# Patient Record
Sex: Female | Born: 1950 | Race: Black or African American | Hispanic: No | Smoking: Current some day smoker
Health system: Southern US, Community
[De-identification: ages and names within clinical notes are randomized; demographics above are authoritative.]

## PROBLEM LIST (undated history)

## (undated) DIAGNOSIS — M1990 Unspecified osteoarthritis, unspecified site: Secondary | ICD-10-CM

## (undated) DIAGNOSIS — H269 Unspecified cataract: Secondary | ICD-10-CM

## (undated) DIAGNOSIS — F319 Bipolar disorder, unspecified: Secondary | ICD-10-CM

## (undated) HISTORY — DX: Unspecified cataract: H26.9

## (undated) HISTORY — PX: ABDOMINAL HYSTERECTOMY: SHX81

## (undated) HISTORY — DX: Unspecified osteoarthritis, unspecified site: M19.90

---

## 2006-03-12 ENCOUNTER — Emergency Department (HOSPITAL_COMMUNITY): Admission: EM | Admit: 2006-03-12 | Discharge: 2006-03-12 | Payer: Self-pay | Admitting: Emergency Medicine

## 2006-03-13 ENCOUNTER — Ambulatory Visit: Payer: Self-pay | Admitting: *Deleted

## 2006-06-08 ENCOUNTER — Ambulatory Visit: Payer: Self-pay | Admitting: Family Medicine

## 2006-07-03 ENCOUNTER — Ambulatory Visit: Payer: Self-pay | Admitting: Family Medicine

## 2007-01-18 ENCOUNTER — Emergency Department (HOSPITAL_COMMUNITY): Admission: EM | Admit: 2007-01-18 | Discharge: 2007-01-18 | Payer: Self-pay | Admitting: Emergency Medicine

## 2007-03-06 ENCOUNTER — Encounter (INDEPENDENT_AMBULATORY_CARE_PROVIDER_SITE_OTHER): Payer: Self-pay | Admitting: *Deleted

## 2008-10-15 ENCOUNTER — Ambulatory Visit: Payer: Self-pay | Admitting: Family Medicine

## 2008-10-28 ENCOUNTER — Ambulatory Visit: Payer: Self-pay | Admitting: Family Medicine

## 2009-01-07 ENCOUNTER — Ambulatory Visit: Payer: Self-pay | Admitting: Internal Medicine

## 2009-02-23 ENCOUNTER — Telehealth (INDEPENDENT_AMBULATORY_CARE_PROVIDER_SITE_OTHER): Payer: Self-pay | Admitting: *Deleted

## 2009-03-07 ENCOUNTER — Emergency Department (HOSPITAL_COMMUNITY): Admission: EM | Admit: 2009-03-07 | Discharge: 2009-03-07 | Payer: Self-pay | Admitting: Emergency Medicine

## 2009-03-31 ENCOUNTER — Emergency Department (HOSPITAL_COMMUNITY): Admission: EM | Admit: 2009-03-31 | Discharge: 2009-03-31 | Payer: Self-pay | Admitting: Emergency Medicine

## 2009-05-24 ENCOUNTER — Ambulatory Visit: Payer: Self-pay | Admitting: Internal Medicine

## 2009-05-24 LAB — CONVERTED CEMR LAB
Albumin: 4.5 g/dL (ref 3.5–5.2)
Alkaline Phosphatase: 108 units/L (ref 39–117)
CO2: 26 meq/L (ref 19–32)
Chloride: 105 meq/L (ref 96–112)
Cholesterol: 171 mg/dL (ref 0–200)
Eosinophils Relative: 4 % (ref 0–5)
Hemoglobin: 13.1 g/dL (ref 12.0–15.0)
Lymphocytes Relative: 36 % (ref 12–46)
Lymphs Abs: 2 10*3/uL (ref 0.7–4.0)
MCHC: 31.4 g/dL (ref 30.0–36.0)
MCV: 80 fL (ref 78.0–100.0)
Monocytes Relative: 6 % (ref 3–12)
Neutro Abs: 3 10*3/uL (ref 1.7–7.7)
Neutrophils Relative %: 53 % (ref 43–77)
RBC: 5.21 M/uL — ABNORMAL HIGH (ref 3.87–5.11)
RDW: 18.1 % — ABNORMAL HIGH (ref 11.5–15.5)
Sodium: 142 meq/L (ref 135–145)
Total CHOL/HDL Ratio: 3.1
Total Protein: 7.3 g/dL (ref 6.0–8.3)

## 2009-06-04 ENCOUNTER — Encounter: Admission: RE | Admit: 2009-06-04 | Discharge: 2009-06-04 | Payer: Self-pay | Admitting: Internal Medicine

## 2009-07-09 ENCOUNTER — Ambulatory Visit: Payer: Self-pay | Admitting: Internal Medicine

## 2009-07-30 ENCOUNTER — Ambulatory Visit: Payer: Self-pay | Admitting: Internal Medicine

## 2009-10-05 ENCOUNTER — Ambulatory Visit: Payer: Self-pay | Admitting: Internal Medicine

## 2010-01-13 ENCOUNTER — Ambulatory Visit: Payer: Self-pay | Admitting: Internal Medicine

## 2010-07-10 ENCOUNTER — Encounter: Payer: Self-pay | Admitting: Internal Medicine

## 2011-06-06 ENCOUNTER — Emergency Department (HOSPITAL_COMMUNITY)
Admission: EM | Admit: 2011-06-06 | Discharge: 2011-06-06 | Disposition: A | Discharge status: Home / Self Care | Payer: Medicaid Other | Attending: Emergency Medicine | Admitting: Emergency Medicine

## 2011-06-06 ENCOUNTER — Emergency Department (HOSPITAL_COMMUNITY): Payer: Medicaid Other

## 2011-06-06 DIAGNOSIS — S8253XA Displaced fracture of medial malleolus of unspecified tibia, initial encounter for closed fracture: Secondary | ICD-10-CM

## 2011-06-06 DIAGNOSIS — M25476 Effusion, unspecified foot: Secondary | ICD-10-CM | POA: Insufficient documentation

## 2011-06-06 DIAGNOSIS — F319 Bipolar disorder, unspecified: Secondary | ICD-10-CM | POA: Insufficient documentation

## 2011-06-06 DIAGNOSIS — M79609 Pain in unspecified limb: Secondary | ICD-10-CM | POA: Insufficient documentation

## 2011-06-06 DIAGNOSIS — IMO0002 Reserved for concepts with insufficient information to code with codable children: Secondary | ICD-10-CM | POA: Insufficient documentation

## 2011-06-06 DIAGNOSIS — M7989 Other specified soft tissue disorders: Secondary | ICD-10-CM | POA: Insufficient documentation

## 2011-06-06 DIAGNOSIS — F17200 Nicotine dependence, unspecified, uncomplicated: Secondary | ICD-10-CM | POA: Insufficient documentation

## 2011-06-06 DIAGNOSIS — M25473 Effusion, unspecified ankle: Secondary | ICD-10-CM | POA: Insufficient documentation

## 2011-06-06 HISTORY — DX: Bipolar disorder, unspecified: F31.9

## 2011-06-06 MED ORDER — HYDROCODONE-ACETAMINOPHEN 5-325 MG PO TABS
1.0000 | ORAL_TABLET | Freq: Once | ORAL | Status: AC
  Administered 2011-06-06: 1 via ORAL
  Filled 2011-06-06: qty 1

## 2011-06-06 MED ORDER — HYDROCODONE-ACETAMINOPHEN 5-325 MG PO TABS: 1.0000 | ORAL_TABLET | ORAL | Status: AC | PRN

## 2011-06-06 NOTE — Progress Notes (Signed)
Orthopedic Tech Progress Note Patient Details:  Valerie Rodgers 1950-10-13 [id number]  Other Ortho Devices Type of Ortho Device: CAM walker Ortho Device Location: (R) LE Ortho Device Interventions: Application   [name] 06/06/2011, 6:55 PM
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
## 2011-06-06 NOTE — ED Notes (Signed)
Returned from xray
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
## 2011-06-06 NOTE — ED Notes (Signed)
Patient transported to X-ray 

## 2011-06-06 NOTE — Progress Notes (Signed)
Orthopedic Tech Progress Note Patient Details:  Valerie Rodgers 1950-07-20 [id number]  Other Ortho Devices Type of Ortho Device: Crutches  Type of Splint: Short Leg Splint Location: (R) LE Splint Interventions: Application    [name] 06/06/2011, 6:32 PM

## 2011-06-14 NOTE — ED Provider Notes (Signed)
Medical screening examination/treatment/procedure(s) were performed by non-physician practitioner and as supervising physician I was immediately available for consultation/collaboration.   Kyndle Schlender E Sabrinna Yearwood, MD 06/14/11 0740 

## 2014-01-05 ENCOUNTER — Ambulatory Visit: Payer: Self-pay | Admitting: Podiatry

## 2015-10-28 ENCOUNTER — Encounter: Payer: Self-pay | Admitting: Specialist

## 2015-11-05 ENCOUNTER — Encounter: Payer: Self-pay | Admitting: Internal Medicine

## 2015-12-22 ENCOUNTER — Ambulatory Visit (AMBULATORY_SURGERY_CENTER): Payer: Self-pay

## 2015-12-22 VITALS — Ht 63.5 in | Wt 190.8 lb

## 2015-12-22 DIAGNOSIS — Z1211 Encounter for screening for malignant neoplasm of colon: Secondary | ICD-10-CM

## 2015-12-22 MED ORDER — SUPREP BOWEL PREP KIT 17.5-3.13-1.6 GM/177ML PO SOLN: 1.0000 | Freq: Once | ORAL | Status: DC

## 2015-12-22 NOTE — Progress Notes (Signed)
No allergies to eggs or soy No past problems with anesthesia No diet meds No home oxygen  No internet 

## 2015-12-24 ENCOUNTER — Encounter: Payer: Self-pay | Admitting: Internal Medicine

## 2016-01-04 ENCOUNTER — Ambulatory Visit (AMBULATORY_SURGERY_CENTER): Payer: Medicare Other | Admitting: Internal Medicine

## 2016-01-04 ENCOUNTER — Encounter: Payer: Self-pay | Admitting: Internal Medicine

## 2016-01-04 VITALS — BP 132/60 | HR 70 | Temp 95.9°F | Resp 19 | Ht 63.0 in | Wt 190.0 lb

## 2016-01-04 DIAGNOSIS — Z1211 Encounter for screening for malignant neoplasm of colon: Secondary | ICD-10-CM | POA: Diagnosis not present

## 2016-01-04 MED ORDER — SODIUM CHLORIDE 0.9 % IV SOLN: 500.0000 mL | INTRAVENOUS | Status: DC

## 2016-01-04 NOTE — Progress Notes (Signed)
Report to PACU, RN, vss, BBS= Clear.  

## 2016-01-04 NOTE — Patient Instructions (Signed)
YOU HAD AN ENDOSCOPIC PROCEDURE TODAY AT THE [facility]:   Refer to the procedure report that was given to you for any specific questions about what was found during the examination.  If the procedure report does not answer your questions, please call your gastroenterologist to clarify.  If you requested that your care partner not be given the details of your procedure findings, then the procedure report has been included in a sealed envelope for you to review at your convenience later.  YOU SHOULD EXPECT: Some feelings of bloating in the abdomen. Passage of more gas than usual.  Walking can help get rid of the air that was put into your GI tract during the procedure and reduce the bloating. If you had a lower endoscopy (such as a colonoscopy or flexible sigmoidoscopy) you may notice spotting of blood in your stool or on the toilet paper. If you underwent a bowel prep for your procedure, you may not have a normal bowel movement for a few days.  Please Note:  You might notice some irritation and congestion in your nose or some drainage.  This is from the oxygen used during your procedure.  There is no need for concern and it should clear up in a day or so.  SYMPTOMS TO REPORT IMMEDIATELY:   Following lower endoscopy (colonoscopy or flexible sigmoidoscopy):  Excessive amounts of blood in the stool  Significant tenderness or worsening of abdominal pains  Swelling of the abdomen that is new, acute  Fever of 100F or higher   For urgent or emergent issues, a gastroenterologist can be reached at any hour by calling [phone].   DIET: Your first meal following the procedure should be a small meal and then it is ok to progress to your normal diet. Heavy or fried foods are harder to digest and may make you feel nauseous or bloated.  Likewise, meals heavy in dairy and vegetables can increase bloating.  Drink plenty of fluids but you should avoid alcoholic beverages for 24 hours. Try to  increase the fiber in your diet, and drink plenty of water.  ACTIVITY:  You should plan to take it easy for the rest of today and you should NOT DRIVE or use heavy machinery until tomorrow (because of the sedation medicines used during the test).    FOLLOW UP: Our staff will call the number listed on your records the next business day following your procedure to check on you and address any questions or concerns that you may have regarding the information given to you following your procedure. If we do not reach you, we will leave a message.  However, if you are feeling well and you are not experiencing any problems, there is no need to return our call.  We will assume that you have returned to your regular daily activities without incident.  If any biopsies were taken you will be contacted by phone or by letter within the next 1-3 weeks.  Please call us at [phone] if you have not heard about the biopsies in 3 weeks.    SIGNATURES/CONFIDENTIALITY: You and/or your care partner have signed paperwork which will be entered into your electronic medical record.  These signatures attest to the fact that that the information above on your After Visit Summary has been reviewed and is understood.  Full responsibility of the confidentiality of this discharge information lies with you and/or your care-partner.  Read all of the handouts given to you by your recovery   room nurse.  Thank-you for choosing us for your healthcare needs today. 

## 2016-01-04 NOTE — Op Note (Signed)
[facility] Patient Name: Valerie Rodgers Procedure Date: 01/04/2016 11:17 AM MRN: [id number] Endoscopist: Beverley Fiedler , MD Age: 65 Referring MD:  Date of Birth: May 01, 1951 Gender: Female Account #: [id number] Procedure:                Colonoscopy Indications:              Screening for colorectal malignant neoplasm, This                            is the patient's first colonoscopy Medicines:                Monitored Anesthesia Care Procedure:                Pre-Anesthesia Assessment:                           - Prior to the procedure, a History and Physical                            was performed, and patient medications and                            allergies were reviewed. The patient's tolerance of                            previous anesthesia was also reviewed. The risks                            and benefits of the procedure and the sedation                            options and risks were discussed with the patient.                            All questions were answered, and informed consent                            was obtained. Prior Anticoagulants: The patient has                            taken no previous anticoagulant or antiplatelet                            agents. ASA Grade Assessment: II - A patient with                            mild systemic disease. After reviewing the risks                            and benefits, the patient was deemed in                            satisfactory condition to undergo the procedure.  After obtaining informed consent, the colonoscope                            was passed under direct vision. Throughout the                            procedure, the patient's blood pressure, pulse, and                            oxygen saturations were monitored continuously. The                            Model PCF-H190DL [id number]) scope was introduced                            through the anus and  advanced to the the cecum,                            identified by appendiceal orifice and ileocecal                            valve. The colonoscopy was performed without                            difficulty. The patient tolerated the procedure                            well. The quality of the bowel preparation was                            good. The ileocecal valve, appendiceal orifice, and                            rectum were photographed. Scope In: 11:25:54 AM Scope Out: 11:38:28 AM Scope Withdrawal Time: 0 hours 9 minutes 22 seconds  Total Procedure Duration: 0 hours 12 minutes 34 seconds  Findings:                 The digital rectal exam was normal.                           Multiple small-mouthed diverticula were found in                            the sigmoid colon.                           The exam was otherwise without abnormality on                            direct and retroflexion views. Complications:            No immediate complications. Estimated Blood Loss:     Estimated blood loss: none. Impression:               - Diverticulosis in the sigmoid colon.                           -  The examination was otherwise normal on direct                            and retroflexion views.                           - No specimens collected. Recommendation:           - Patient has a contact number available for                            emergencies. The signs and symptoms of potential                            delayed complications were discussed with the                            patient. Return to normal activities tomorrow.                            Written discharge instructions were provided to the                            patient.                           - Resume previous diet.                           - Continue present medications.                           - Repeat colonoscopy in 10 years for screening                            purposes. Beverley Fiedler,  MD 01/04/2016 11:40:37 AM This report has been signed electronically.

## 2016-01-05 ENCOUNTER — Telehealth: Payer: Self-pay

## 2016-01-05 NOTE — Telephone Encounter (Signed)
  Follow up Call-  Call back number 01/04/2016  Post procedure Call Back phone  # [phone]  Permission to leave phone message Yes     Patient questions:  Do you have a fever, pain , or abdominal swelling? No. Pain Score  0 *  Have you tolerated food without any problems? Yes.    Have you been able to return to your normal activities? Yes.    Do you have any questions about your discharge instructions: Diet   No. Medications  No. Follow up visit  No.  Do you have questions or concerns about your Care? No.  Actions: * If pain score is 4 or above: No action needed, pain <4.

## 2016-11-23 ENCOUNTER — Ambulatory Visit (INDEPENDENT_AMBULATORY_CARE_PROVIDER_SITE_OTHER): Payer: Medicare Other | Admitting: Orthopaedic Surgery

## 2017-01-17 ENCOUNTER — Encounter (HOSPITAL_COMMUNITY): Payer: Self-pay | Admitting: Emergency Medicine

## 2017-01-17 ENCOUNTER — Emergency Department (HOSPITAL_COMMUNITY)
Admission: EM | Admit: 2017-01-17 | Discharge: 2017-01-17 | Discharge status: Against Medical Advice | Payer: Medicare Other | Attending: Emergency Medicine | Admitting: Emergency Medicine

## 2017-01-17 DIAGNOSIS — R079 Chest pain, unspecified: Secondary | ICD-10-CM | POA: Insufficient documentation

## 2017-01-17 DIAGNOSIS — Z5321 Procedure and treatment not carried out due to patient leaving prior to being seen by health care provider: Secondary | ICD-10-CM | POA: Diagnosis not present

## 2017-01-17 LAB — CBC
HCT: 42.7 % (ref 36.0–46.0)
Hemoglobin: 14.1 g/dL (ref 12.0–15.0)
MCH: 27.5 pg (ref 26.0–34.0)
MCHC: 33 g/dL (ref 30.0–36.0)
MCV: 83.4 fL (ref 78.0–100.0)
PLATELETS: 285 10*3/uL (ref 150–400)
RBC: 5.12 MIL/uL — AB (ref 3.87–5.11)
RDW: 15.4 % (ref 11.5–15.5)
WBC: 9.9 10*3/uL (ref 4.0–10.5)

## 2017-01-17 LAB — BASIC METABOLIC PANEL
Anion gap: 6 (ref 5–15)
BUN: 29 mg/dL — ABNORMAL HIGH (ref 6–20)
CALCIUM: 8.8 mg/dL — AB (ref 8.9–10.3)
CO2: 26 mmol/L (ref 22–32)
CREATININE: 1.54 mg/dL — AB (ref 0.44–1.00)
Chloride: 108 mmol/L (ref 101–111)
GFR, EST AFRICAN AMERICAN: 39 mL/min — AB (ref >60)
GFR, EST NON AFRICAN AMERICAN: 34 mL/min — AB (ref >60)
Glucose, Bld: 102 mg/dL — ABNORMAL HIGH (ref 65–99)
Potassium: 4.3 mmol/L (ref 3.5–5.1)
SODIUM: 140 mmol/L (ref 135–145)

## 2017-01-17 LAB — I-STAT TROPONIN, ED: TROPONIN I, POC: 0.01 ng/mL (ref 0.00–0.08)
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
## 2017-01-17 NOTE — ED Notes (Signed)
Pt reports central CP with L shoulder pain that started approx 30 min ago. Pt thinks it is r/t the spaghetti she just ate. Before this RN could even finish triage she stated she needed to leave b/c she forgot to turn her oven off. Pt LWBS.

## 2017-11-05 ENCOUNTER — Other Ambulatory Visit: Payer: Self-pay | Admitting: Specialist

## 2017-11-05 DIAGNOSIS — Z1231 Encounter for screening mammogram for malignant neoplasm of breast: Secondary | ICD-10-CM

## 2017-11-27 ENCOUNTER — Ambulatory Visit: Payer: Medicare Other

## 2018-03-21 ENCOUNTER — Other Ambulatory Visit: Payer: Self-pay

## 2018-03-21 DIAGNOSIS — I83893 Varicose veins of bilateral lower extremities with other complications: Secondary | ICD-10-CM

## 2018-03-21 DIAGNOSIS — I739 Peripheral vascular disease, unspecified: Secondary | ICD-10-CM

## 2018-03-28 ENCOUNTER — Other Ambulatory Visit: Payer: Self-pay | Admitting: Podiatry

## 2018-03-28 ENCOUNTER — Ambulatory Visit (INDEPENDENT_AMBULATORY_CARE_PROVIDER_SITE_OTHER): Payer: Medicare Other

## 2018-03-28 ENCOUNTER — Ambulatory Visit (INDEPENDENT_AMBULATORY_CARE_PROVIDER_SITE_OTHER): Payer: Medicare Other | Admitting: Podiatry

## 2018-03-28 VITALS — BP 115/84 | HR 54

## 2018-03-28 DIAGNOSIS — M21962 Unspecified acquired deformity of left lower leg: Secondary | ICD-10-CM

## 2018-03-28 DIAGNOSIS — M21611 Bunion of right foot: Secondary | ICD-10-CM

## 2018-03-28 DIAGNOSIS — M21961 Unspecified acquired deformity of right lower leg: Secondary | ICD-10-CM

## 2018-03-28 DIAGNOSIS — M21612 Bunion of left foot: Secondary | ICD-10-CM

## 2018-03-28 DIAGNOSIS — M25571 Pain in right ankle and joints of right foot: Secondary | ICD-10-CM | POA: Diagnosis not present

## 2018-03-28 DIAGNOSIS — M2011 Hallux valgus (acquired), right foot: Secondary | ICD-10-CM

## 2018-03-28 DIAGNOSIS — M2012 Hallux valgus (acquired), left foot: Secondary | ICD-10-CM

## 2018-03-28 DIAGNOSIS — M25572 Pain in left ankle and joints of left foot: Secondary | ICD-10-CM

## 2018-03-28 NOTE — Patient Instructions (Signed)

## 2018-03-29 NOTE — Progress Notes (Signed)
  Subjective:  Patient ID: Valerie Rodgers, female    DOB: 11-15-1950,  MRN: [id number]  Chief Complaint  Patient presents with  . Bunions    bilateral with callouses - starting to become painful   67 y.o. female presents with the above complaint. Denies DM states they are starting to become very painful.  Review of Systems: Negative except as noted in the HPI. Denies N/V/F/Ch.  Past Medical History:  Diagnosis Date  . Arthritis   . Bipolar affective disorder (HCC)   . Cataract     Current Outpatient Medications:  .  acetaminophen-codeine (TYLENOL #4) 300-60 MG tablet, , Disp: , Rfl:  .  albuterol (PROVENTIL HFA;VENTOLIN HFA) 108 (90 Base) MCG/ACT inhaler, , Disp: , Rfl:  .  Flaxseed, Linseed, (FLAX SEED OIL PO), Take 1 capsule by mouth daily., Disp: , Rfl:  .  ibuprofen (ADVIL,MOTRIN) 200 MG tablet, Take 400 mg by mouth every 6 (six) hours as needed. For pain , Disp: , Rfl:  .  Multiple Vitamin (MULTIVITAMIN) tablet, Take 1 tablet by mouth daily., Disp: , Rfl:   Social History   Tobacco Use  Smoking Status Current Some Day Smoker  Smokeless Tobacco Never Used    No Known Allergies Objective:   Vitals:   03/28/18 1017  BP: 115/84  Pulse: (!) 54   There is no height or weight on file to calculate BMI. Constitutional Well developed. Well nourished.  Vascular Dorsalis pedis pulses palpable bilaterally. Posterior tibial pulses palpable bilaterally. Capillary refill normal to all digits.  No cyanosis or clubbing noted. Pedal hair growth normal.  Neurologic Normal speech. Oriented to person, place, and time. Epicritic sensation to light touch grossly present bilaterally.  Dermatologic Nails well groomed and normal in appearance. No open wounds. No skin lesions.  Orthopedic: Normal joint ROM without pain or crepitus bilaterally. Hallux abductovalgus deformity present - bilat Left 1st MPJ full range of motion. Left 1st TMT without gross hypermobility. Right 1st MPJ  full range of motion  Right 1st TMT without gross hypermobility. Lesser digital contractures absent bilaterally.   Radiographs: Taken and reviewed. Hallux abductovalgus deformity present.   Assessment:   1. Bilateral bunions   2. Pain in joints of both feet   3. Deformity of metatarsal bone of left foot   4. Deformity of metatarsal bone of right foot   5. Acquired hallux valgus of both feet    Plan:  Patient was evaluated and treated and all questions answered.  Hallux abductovalgus deformity,  -XR as above. -Discussed conservative therapy including padding and shoe gear change. -Will discuss possible surgical intervention at next visit.  Return in about 6 weeks (around 05/09/2018) for Bunion f/u, discuss possible surgery.

## 2018-05-08 ENCOUNTER — Ambulatory Visit: Payer: Medicare Other

## 2018-05-09 ENCOUNTER — Ambulatory Visit: Payer: Medicare Other | Admitting: Podiatry

## 2018-05-14 ENCOUNTER — Inpatient Hospital Stay (HOSPITAL_COMMUNITY): Admission: RE | Admit: 2018-05-14 | Payer: Medicare Other | Source: Ambulatory Visit

## 2018-05-15 ENCOUNTER — Encounter: Payer: Medicare Other | Admitting: Vascular Surgery

## 2018-05-15 ENCOUNTER — Inpatient Hospital Stay (HOSPITAL_COMMUNITY): Admission: RE | Admit: 2018-05-15 | Payer: Medicare Other | Source: Ambulatory Visit

## 2018-11-08 ENCOUNTER — Encounter: Payer: Self-pay | Admitting: Podiatry

## 2018-11-08 ENCOUNTER — Other Ambulatory Visit: Payer: Self-pay

## 2018-11-08 ENCOUNTER — Ambulatory Visit (INDEPENDENT_AMBULATORY_CARE_PROVIDER_SITE_OTHER): Payer: Medicare Other | Admitting: Podiatry

## 2018-11-08 VITALS — Temp 97.2°F

## 2018-11-08 DIAGNOSIS — M2011 Hallux valgus (acquired), right foot: Secondary | ICD-10-CM

## 2018-11-08 DIAGNOSIS — M21611 Bunion of right foot: Secondary | ICD-10-CM | POA: Diagnosis not present

## 2018-11-08 MED ORDER — CICLOPIROX 8 % EX SOLN: Freq: Every day | CUTANEOUS | 0 refills | Status: DC

## 2018-11-17 NOTE — Progress Notes (Signed)
  Subjective:  Patient ID: Valerie Rodgers, female    DOB: Dec 17, 1950,  MRN: [id number]  Chief Complaint  Patient presents with  . Consult    Discuss surgery for HAV bilateral (R>L) - patient would like to start with the right foot first    68 y.o. female presents with the above complaint.  Here for surgical discussion of bunions to both feet.   Review of Systems: Negative except as noted in the HPI. Denies N/V/F/Ch.  Past Medical History:  Diagnosis Date  . Arthritis   . Bipolar affective disorder (HCC)   . Cataract     Current Outpatient Medications:  .  albuterol (PROVENTIL HFA;VENTOLIN HFA) 108 (90 Base) MCG/ACT inhaler, , Disp: , Rfl:  .  amLODipine (NORVASC) 5 MG tablet, Take 5 mg by mouth daily., Disp: , Rfl:  .  ciclopirox (PENLAC) 8 % solution, Apply topically at bedtime. Apply over nail and surrounding skin. Apply daily over previous coat. Remove weekly with file or polish remover., Disp: 6.6 mL, Rfl: 0 .  Flaxseed, Linseed, (FLAX SEED OIL PO), Take 1 capsule by mouth daily., Disp: , Rfl:  .  lisinopril-hydrochlorothiazide (ZESTORETIC) 10-12.5 MG tablet, , Disp: , Rfl:  .  Multiple Vitamin (MULTIVITAMIN) tablet, Take 1 tablet by mouth daily., Disp: , Rfl:  .  SYMBICORT 160-4.5 MCG/ACT inhaler, Inhale 2 puffs into the lungs 2 (two) times daily., Disp: , Rfl:   Social History   Tobacco Use  Smoking Status Current Some Day Smoker  Smokeless Tobacco Never Used    No Known Allergies Objective:   Vitals:   11/08/18 1314  Temp: (!) 97.2 F (36.2 C)   There is no height or weight on file to calculate BMI. Constitutional Well developed. Well nourished.  Vascular Dorsalis pedis pulses palpable bilaterally. Posterior tibial pulses palpable bilaterally. Capillary refill normal to all digits.  No cyanosis or clubbing noted. Pedal hair growth normal.  Neurologic Normal speech. Oriented to person, place, and time. Epicritic sensation to light touch grossly present  bilaterally.  Dermatologic Nails well groomed and normal in appearance. No open wounds. No skin lesions.  Orthopedic:  HAV deformity bilaterally without pain to palpation   Radiographs: Taken reviewed hallux abductovalgus deformity Assessment:   1. Hallux valgus with bunions of right foot    Plan:  Patient was evaluated and treated and all questions answered.  Hallux valgus deformity right foot -Discussed with patient that due to the fact that they do not hurt her I would recommend against proceeding with surgery recommend shoe gear changes and if she does experience pain we can further consider.  Advised her to quit smoking if she would like to consider surgery.  Follow-up as needed should they cause her pain  No follow-ups on file.

## 2018-12-09 ENCOUNTER — Other Ambulatory Visit: Payer: Self-pay | Admitting: Specialist

## 2018-12-09 DIAGNOSIS — Z1231 Encounter for screening mammogram for malignant neoplasm of breast: Secondary | ICD-10-CM

## 2019-01-23 ENCOUNTER — Ambulatory Visit: Payer: Medicare Other

## 2019-01-29 ENCOUNTER — Ambulatory Visit
Admission: RE | Admit: 2019-01-29 | Discharge: 2019-01-29 | Disposition: A | Discharge status: Home / Self Care | Payer: Medicare Other | Source: Ambulatory Visit | Attending: Specialist | Admitting: Specialist

## 2019-01-29 ENCOUNTER — Other Ambulatory Visit: Payer: Self-pay

## 2019-01-29 DIAGNOSIS — Z1231 Encounter for screening mammogram for malignant neoplasm of breast: Secondary | ICD-10-CM

## 2019-07-29 ENCOUNTER — Ambulatory Visit: Payer: Medicare Other | Attending: Internal Medicine

## 2019-09-22 ENCOUNTER — Ambulatory Visit: Payer: Medicare Other

## 2019-09-25 ENCOUNTER — Ambulatory Visit: Payer: Medicare Other | Attending: Internal Medicine

## 2019-09-25 DIAGNOSIS — Z23 Encounter for immunization: Secondary | ICD-10-CM

## 2019-09-25 NOTE — Progress Notes (Signed)
   Covid-19 Vaccination Clinic  Name:  LEATHIE WEICH    MRN: [id number] DOB: 03-Aug-1950  09/25/2019  Ms. Wester was observed post Covid-19 immunization for 15 minutes without incident. She was provided with Vaccine Information Sheet and instruction to access the V-Safe system.   Ms. Tricarico was instructed to call 911 with any severe reactions post vaccine: Marland Kitchen Difficulty breathing  . Swelling of face and throat  . A fast heartbeat  . A bad rash all over body  . Dizziness and weakness   Immunizations Administered    Name Date Dose VIS Date Route   Pfizer COVID-19 Vaccine 09/25/2019 12:10 PM 0.3 mL 05/30/2019 Intramuscular   Manufacturer: [employer], [employer]   Lot: [id number]   NDC: [address]-3

## 2019-10-22 ENCOUNTER — Ambulatory Visit: Payer: Medicare Other | Attending: Internal Medicine

## 2019-10-22 DIAGNOSIS — Z23 Encounter for immunization: Secondary | ICD-10-CM

## 2019-10-22 NOTE — Progress Notes (Signed)
   Covid-19 Vaccination Clinic  Name:  [name]    MRN: [id number] DOB: March 08, 1951  10/22/2019  [name] was observed post Covid-19 immunization for 15 minutes without incident. She was provided with Vaccine Information Sheet and instruction to access the V-Safe system.   [name] was instructed to call 911 with any severe reactions post vaccine: Marland Kitchen Difficulty breathing  . Swelling of face and throat  . A fast heartbeat  . A bad rash all over body  . Dizziness and weakness   Immunizations Administered    Name Date Dose VIS Date Route   Pfizer COVID-19 Vaccine 10/22/2019 12:39 PM 0.3 mL 08/13/2018 Intramuscular   Manufacturer: [employer], [employer]   Lot: [id number]   NDC: [address]-7

## 2021-07-17 ENCOUNTER — Other Ambulatory Visit: Payer: Self-pay

## 2021-07-17 ENCOUNTER — Emergency Department (HOSPITAL_COMMUNITY): Payer: Medicare Other

## 2021-07-17 ENCOUNTER — Inpatient Hospital Stay (HOSPITAL_COMMUNITY)
Admission: EM | Admit: 2021-07-17 | Discharge: 2021-07-23 | DRG: 291 | Disposition: A | Discharge status: Home Health | Payer: Medicare Other | Attending: Internal Medicine | Admitting: Internal Medicine

## 2021-07-17 DIAGNOSIS — I471 Supraventricular tachycardia: Secondary | ICD-10-CM | POA: Diagnosis present

## 2021-07-17 DIAGNOSIS — K7469 Other cirrhosis of liver: Secondary | ICD-10-CM

## 2021-07-17 DIAGNOSIS — T405X1A Poisoning by cocaine, accidental (unintentional), initial encounter: Secondary | ICD-10-CM | POA: Diagnosis present

## 2021-07-17 DIAGNOSIS — I501 Left ventricular failure: Secondary | ICD-10-CM | POA: Diagnosis present

## 2021-07-17 DIAGNOSIS — I10 Essential (primary) hypertension: Secondary | ICD-10-CM | POA: Diagnosis present

## 2021-07-17 DIAGNOSIS — R778 Other specified abnormalities of plasma proteins: Secondary | ICD-10-CM

## 2021-07-17 DIAGNOSIS — J9601 Acute respiratory failure with hypoxia: Secondary | ICD-10-CM | POA: Diagnosis present

## 2021-07-17 DIAGNOSIS — I130 Hypertensive heart and chronic kidney disease with heart failure and stage 1 through stage 4 chronic kidney disease, or unspecified chronic kidney disease: Secondary | ICD-10-CM | POA: Diagnosis not present

## 2021-07-17 DIAGNOSIS — E870 Hyperosmolality and hypernatremia: Secondary | ICD-10-CM | POA: Diagnosis not present

## 2021-07-17 DIAGNOSIS — I5020 Unspecified systolic (congestive) heart failure: Secondary | ICD-10-CM

## 2021-07-17 DIAGNOSIS — I5043 Acute on chronic combined systolic (congestive) and diastolic (congestive) heart failure: Secondary | ICD-10-CM | POA: Diagnosis present

## 2021-07-17 DIAGNOSIS — J441 Chronic obstructive pulmonary disease with (acute) exacerbation: Secondary | ICD-10-CM | POA: Diagnosis present

## 2021-07-17 DIAGNOSIS — I472 Ventricular tachycardia, unspecified: Secondary | ICD-10-CM | POA: Diagnosis present

## 2021-07-17 DIAGNOSIS — I509 Heart failure, unspecified: Secondary | ICD-10-CM | POA: Diagnosis not present

## 2021-07-17 DIAGNOSIS — I248 Other forms of acute ischemic heart disease: Secondary | ICD-10-CM | POA: Diagnosis present

## 2021-07-17 DIAGNOSIS — E875 Hyperkalemia: Secondary | ICD-10-CM | POA: Diagnosis not present

## 2021-07-17 DIAGNOSIS — F1410 Cocaine abuse, uncomplicated: Secondary | ICD-10-CM | POA: Diagnosis present

## 2021-07-17 DIAGNOSIS — J811 Chronic pulmonary edema: Secondary | ICD-10-CM

## 2021-07-17 DIAGNOSIS — J9621 Acute and chronic respiratory failure with hypoxia: Secondary | ICD-10-CM

## 2021-07-17 DIAGNOSIS — F319 Bipolar disorder, unspecified: Secondary | ICD-10-CM | POA: Diagnosis present

## 2021-07-17 DIAGNOSIS — F17210 Nicotine dependence, cigarettes, uncomplicated: Secondary | ICD-10-CM | POA: Diagnosis present

## 2021-07-17 DIAGNOSIS — Z20822 Contact with and (suspected) exposure to covid-19: Secondary | ICD-10-CM | POA: Diagnosis present

## 2021-07-17 DIAGNOSIS — N1831 Chronic kidney disease, stage 3a: Secondary | ICD-10-CM | POA: Diagnosis present

## 2021-07-17 DIAGNOSIS — R7989 Other specified abnormal findings of blood chemistry: Secondary | ICD-10-CM | POA: Diagnosis present

## 2021-07-17 DIAGNOSIS — K7460 Unspecified cirrhosis of liver: Secondary | ICD-10-CM | POA: Diagnosis present

## 2021-07-17 LAB — CBC WITH DIFFERENTIAL/PLATELET
Abs Immature Granulocytes: 0.1 10*3/uL — ABNORMAL HIGH (ref 0.00–0.07)
Basophils Absolute: 0 10*3/uL (ref 0.0–0.1)
Basophils Relative: 0 %
Eosinophils Absolute: 0 10*3/uL (ref 0.0–0.5)
Eosinophils Relative: 0 %
HCT: 39.9 % (ref 36.0–46.0)
Hemoglobin: 13.6 g/dL (ref 12.0–15.0)
Immature Granulocytes: 1 %
Lymphocytes Relative: 6 %
Lymphs Abs: 0.9 10*3/uL (ref 0.7–4.0)
MCH: 28.8 pg (ref 26.0–34.0)
MCHC: 34.1 g/dL (ref 30.0–36.0)
MCV: 84.4 fL (ref 80.0–100.0)
Monocytes Absolute: 1.2 10*3/uL — ABNORMAL HIGH (ref 0.1–1.0)
Monocytes Relative: 8 %
Neutro Abs: 12 10*3/uL — ABNORMAL HIGH (ref 1.7–7.7)
Neutrophils Relative %: 85 %
Platelets: 273 10*3/uL (ref 150–400)
RBC: 4.73 MIL/uL (ref 3.87–5.11)
RDW: 14.6 % (ref 11.5–15.5)
WBC: 14.2 10*3/uL — ABNORMAL HIGH (ref 4.0–10.5)
nRBC: 0 % (ref 0.0–0.2)

## 2021-07-17 LAB — BASIC METABOLIC PANEL
Anion gap: 12 (ref 5–15)
BUN: 19 mg/dL (ref 8–23)
CO2: 24 mmol/L (ref 22–32)
Calcium: 8.9 mg/dL (ref 8.9–10.3)
Chloride: 101 mmol/L (ref 98–111)
Creatinine, Ser: 1.21 mg/dL — ABNORMAL HIGH (ref 0.44–1.00)
GFR, Estimated: 48 mL/min — ABNORMAL LOW (ref >60)
Glucose, Bld: 118 mg/dL — ABNORMAL HIGH (ref 70–99)
Potassium: 4.2 mmol/L (ref 3.5–5.1)
Sodium: 137 mmol/L (ref 135–145)

## 2021-07-17 LAB — I-STAT VENOUS BLOOD GAS, ED
Acid-Base Excess: 3 mmol/L — ABNORMAL HIGH (ref 0.0–2.0)
Bicarbonate: 29.8 mmol/L — ABNORMAL HIGH (ref 20.0–28.0)
Calcium, Ion: 1.16 mmol/L (ref 1.15–1.40)
HCT: 41 % (ref 36.0–46.0)
Hemoglobin: 13.9 g/dL (ref 12.0–15.0)
O2 Saturation: 74 %
Potassium: 3.8 mmol/L (ref 3.5–5.1)
Sodium: 141 mmol/L (ref 135–145)
TCO2: 31 mmol/L (ref 22–32)
pCO2, Ven: 54.9 mmHg (ref 44.0–60.0)
pH, Ven: 7.342 (ref 7.250–7.430)
pO2, Ven: 43 mmHg (ref 32.0–45.0)

## 2021-07-17 LAB — RESP PANEL BY RT-PCR (FLU A&B, COVID) ARPGX2
Influenza A by PCR: NEGATIVE
Influenza B by PCR: NEGATIVE
SARS Coronavirus 2 by RT PCR: NEGATIVE

## 2021-07-17 LAB — BRAIN NATRIURETIC PEPTIDE: B Natriuretic Peptide: 2729.9 pg/mL — ABNORMAL HIGH (ref 0.0–100.0)

## 2021-07-17 LAB — TROPONIN I (HIGH SENSITIVITY): Troponin I (High Sensitivity): 156 ng/L (ref <18)

## 2021-07-17 MED ORDER — IPRATROPIUM BROMIDE 0.02 % IN SOLN
0.5000 mg | Freq: Once | RESPIRATORY_TRACT | Status: AC
  Administered 2021-07-17: 0.5 mg via RESPIRATORY_TRACT
  Filled 2021-07-17 (×2): qty 2.5

## 2021-07-17 MED ORDER — METHYLPREDNISOLONE SODIUM SUCC 125 MG IJ SOLR
125.0000 mg | Freq: Once | INTRAMUSCULAR | Status: AC
  Administered 2021-07-17: 125 mg via INTRAVENOUS
  Filled 2021-07-17: qty 2

## 2021-07-17 MED ORDER — ALBUTEROL SULFATE (2.5 MG/3ML) 0.083% IN NEBU
10.0000 mg/h | INHALATION_SOLUTION | RESPIRATORY_TRACT | Status: DC
  Administered 2021-07-17: 10 mg/h via RESPIRATORY_TRACT
  Filled 2021-07-17: qty 12

## 2021-07-17 MED ORDER — FUROSEMIDE 10 MG/ML IJ SOLN
20.0000 mg | Freq: Once | INTRAMUSCULAR | Status: AC
  Administered 2021-07-18: 20 mg via INTRAVENOUS
  Filled 2021-07-17: qty 2

## 2021-07-17 MED ORDER — IPRATROPIUM-ALBUTEROL 0.5-2.5 (3) MG/3ML IN SOLN
3.0000 mL | RESPIRATORY_TRACT | Status: DC
  Administered 2021-07-18: 3 mL via RESPIRATORY_TRACT
  Filled 2021-07-17: qty 3

## 2021-07-17 NOTE — ED Notes (Signed)
RT at bedside.

## 2021-07-17 NOTE — ED Triage Notes (Signed)
Pt bib [facility] from home c/o SOB for the past 5 days. Pt has also been n/v, unable to eat. Pt has hx of COPD. Pt took her albuterol inhaler at home multiple times with no relief. When EMS arrived Pt SpO2 was 70% on RA. Pt was placed on 15L NRB. Upon arrival pt had a simple O2 mask on at 10L stating at 92%. Pt does admit to cocaine use, and took 2 shots of liquor after.   EMS vitals 126/82 BP 60--80 HR

## 2021-07-18 ENCOUNTER — Inpatient Hospital Stay (HOSPITAL_COMMUNITY): Payer: Medicare Other

## 2021-07-18 ENCOUNTER — Encounter (HOSPITAL_COMMUNITY): Payer: Self-pay | Admitting: Internal Medicine

## 2021-07-18 ENCOUNTER — Emergency Department (HOSPITAL_COMMUNITY): Payer: Medicare Other

## 2021-07-18 DIAGNOSIS — I5021 Acute systolic (congestive) heart failure: Secondary | ICD-10-CM | POA: Diagnosis not present

## 2021-07-18 DIAGNOSIS — I501 Left ventricular failure: Secondary | ICD-10-CM | POA: Diagnosis not present

## 2021-07-18 DIAGNOSIS — F1410 Cocaine abuse, uncomplicated: Secondary | ICD-10-CM | POA: Diagnosis present

## 2021-07-18 DIAGNOSIS — J441 Chronic obstructive pulmonary disease with (acute) exacerbation: Secondary | ICD-10-CM | POA: Diagnosis present

## 2021-07-18 DIAGNOSIS — R0602 Shortness of breath: Secondary | ICD-10-CM | POA: Diagnosis not present

## 2021-07-18 DIAGNOSIS — R7989 Other specified abnormal findings of blood chemistry: Secondary | ICD-10-CM | POA: Diagnosis present

## 2021-07-18 DIAGNOSIS — I5043 Acute on chronic combined systolic (congestive) and diastolic (congestive) heart failure: Secondary | ICD-10-CM | POA: Diagnosis present

## 2021-07-18 DIAGNOSIS — I471 Supraventricular tachycardia: Secondary | ICD-10-CM | POA: Diagnosis present

## 2021-07-18 DIAGNOSIS — I10 Essential (primary) hypertension: Secondary | ICD-10-CM | POA: Diagnosis present

## 2021-07-18 DIAGNOSIS — N1831 Chronic kidney disease, stage 3a: Secondary | ICD-10-CM | POA: Diagnosis present

## 2021-07-18 DIAGNOSIS — E875 Hyperkalemia: Secondary | ICD-10-CM | POA: Diagnosis not present

## 2021-07-18 DIAGNOSIS — Z20822 Contact with and (suspected) exposure to covid-19: Secondary | ICD-10-CM | POA: Diagnosis present

## 2021-07-18 DIAGNOSIS — T405X1A Poisoning by cocaine, accidental (unintentional), initial encounter: Secondary | ICD-10-CM | POA: Diagnosis present

## 2021-07-18 DIAGNOSIS — K7460 Unspecified cirrhosis of liver: Secondary | ICD-10-CM | POA: Diagnosis present

## 2021-07-18 DIAGNOSIS — R778 Other specified abnormalities of plasma proteins: Secondary | ICD-10-CM

## 2021-07-18 DIAGNOSIS — I472 Ventricular tachycardia, unspecified: Secondary | ICD-10-CM | POA: Diagnosis present

## 2021-07-18 DIAGNOSIS — F319 Bipolar disorder, unspecified: Secondary | ICD-10-CM | POA: Diagnosis present

## 2021-07-18 DIAGNOSIS — E870 Hyperosmolality and hypernatremia: Secondary | ICD-10-CM | POA: Diagnosis not present

## 2021-07-18 DIAGNOSIS — I509 Heart failure, unspecified: Secondary | ICD-10-CM | POA: Diagnosis present

## 2021-07-18 DIAGNOSIS — I5041 Acute combined systolic (congestive) and diastolic (congestive) heart failure: Secondary | ICD-10-CM | POA: Diagnosis not present

## 2021-07-18 DIAGNOSIS — J9621 Acute and chronic respiratory failure with hypoxia: Secondary | ICD-10-CM | POA: Diagnosis present

## 2021-07-18 DIAGNOSIS — F17210 Nicotine dependence, cigarettes, uncomplicated: Secondary | ICD-10-CM | POA: Diagnosis present

## 2021-07-18 DIAGNOSIS — I248 Other forms of acute ischemic heart disease: Secondary | ICD-10-CM | POA: Diagnosis present

## 2021-07-18 DIAGNOSIS — I130 Hypertensive heart and chronic kidney disease with heart failure and stage 1 through stage 4 chronic kidney disease, or unspecified chronic kidney disease: Secondary | ICD-10-CM | POA: Diagnosis present

## 2021-07-18 DIAGNOSIS — J9601 Acute respiratory failure with hypoxia: Secondary | ICD-10-CM | POA: Diagnosis not present

## 2021-07-18 LAB — URINALYSIS, COMPLETE (UACMP) WITH MICROSCOPIC
Bilirubin Urine: NEGATIVE
Glucose, UA: NEGATIVE mg/dL
Hgb urine dipstick: NEGATIVE
Ketones, ur: NEGATIVE mg/dL
Leukocytes,Ua: NEGATIVE
Nitrite: NEGATIVE
Protein, ur: NEGATIVE mg/dL
Specific Gravity, Urine: 1.02 (ref 1.005–1.030)
pH: 5.5 (ref 5.0–8.0)

## 2021-07-18 LAB — HEPATIC FUNCTION PANEL
ALT: 63 U/L — ABNORMAL HIGH (ref 0–44)
AST: 52 U/L — ABNORMAL HIGH (ref 15–41)
Albumin: 2.4 g/dL — ABNORMAL LOW (ref 3.5–5.0)
Alkaline Phosphatase: 122 U/L (ref 38–126)
Bilirubin, Direct: 0.3 mg/dL — ABNORMAL HIGH (ref 0.0–0.2)
Indirect Bilirubin: 0.7 mg/dL (ref 0.3–0.9)
Total Bilirubin: 1 mg/dL (ref 0.3–1.2)
Total Protein: 6.1 g/dL — ABNORMAL LOW (ref 6.5–8.1)

## 2021-07-18 LAB — ECHOCARDIOGRAM COMPLETE
AR max vel: 1.67 cm2
AV Area VTI: 1.63 cm2
AV Area mean vel: 1.65 cm2
AV Mean grad: 4 mmHg
AV Peak grad: 7.4 mmHg
Ao pk vel: 1.36 m/s
Area-P 1/2: 5.54 cm2
Calc EF: 24.2 %
Height: 64 in
MV VTI: 0.33 cm2
P 1/2 time: 269 msec
S' Lateral: 4.8 cm
Single Plane A2C EF: -7.8 %
Single Plane A4C EF: 44 %
Weight: 2553.81 oz

## 2021-07-18 LAB — LACTIC ACID, PLASMA: Lactic Acid, Venous: 1.4 mmol/L (ref 0.5–1.9)

## 2021-07-18 LAB — PROCALCITONIN: Procalcitonin: 0.33 ng/mL

## 2021-07-18 LAB — RAPID URINE DRUG SCREEN, HOSP PERFORMED
Amphetamines: NOT DETECTED
Barbiturates: NOT DETECTED
Benzodiazepines: NOT DETECTED
Cocaine: POSITIVE — AB
Opiates: NOT DETECTED
Tetrahydrocannabinol: NOT DETECTED

## 2021-07-18 LAB — D-DIMER, QUANTITATIVE: D-Dimer, Quant: 1.83 ug/mL-FEU — ABNORMAL HIGH (ref 0.00–0.50)

## 2021-07-18 LAB — ETHANOL: Alcohol, Ethyl (B): <10 mg/dL (ref <10)

## 2021-07-18 LAB — TROPONIN I (HIGH SENSITIVITY): Troponin I (High Sensitivity): 143 ng/L (ref <18)

## 2021-07-18 MED ORDER — IOHEXOL 350 MG/ML SOLN
80.0000 mL | Freq: Once | INTRAVENOUS | Status: AC | PRN
  Administered 2021-07-18: 80 mL via INTRAVENOUS

## 2021-07-18 MED ORDER — IPRATROPIUM-ALBUTEROL 0.5-2.5 (3) MG/3ML IN SOLN: 3.0000 mL | RESPIRATORY_TRACT | Status: DC | PRN

## 2021-07-18 MED ORDER — ONDANSETRON HCL 4 MG PO TABS: 4.0000 mg | ORAL_TABLET | Freq: Four times a day (QID) | ORAL | Status: DC | PRN

## 2021-07-18 MED ORDER — SODIUM CHLORIDE 0.9 % IV SOLN
2.0000 g | INTRAVENOUS | Status: DC
  Administered 2021-07-18: 2 g via INTRAVENOUS
  Filled 2021-07-18: qty 20

## 2021-07-18 MED ORDER — FUROSEMIDE 10 MG/ML IJ SOLN
40.0000 mg | Freq: Two times a day (BID) | INTRAMUSCULAR | Status: DC
  Administered 2021-07-18 – 2021-07-20 (×5): 40 mg via INTRAVENOUS
  Filled 2021-07-18 (×6): qty 4

## 2021-07-18 MED ORDER — POLYETHYLENE GLYCOL 3350 17 G PO PACK: 17.0000 g | PACK | Freq: Every day | ORAL | Status: DC | PRN

## 2021-07-18 MED ORDER — ENOXAPARIN SODIUM 40 MG/0.4ML IJ SOSY
40.0000 mg | PREFILLED_SYRINGE | Freq: Every day | INTRAMUSCULAR | Status: DC
  Administered 2021-07-18 – 2021-07-23 (×6): 40 mg via SUBCUTANEOUS
  Filled 2021-07-18 (×6): qty 0.4

## 2021-07-18 MED ORDER — FLUTICASONE FUROATE-VILANTEROL 200-25 MCG/ACT IN AEPB
1.0000 | INHALATION_SPRAY | Freq: Every day | RESPIRATORY_TRACT | Status: DC
  Administered 2021-07-22 – 2021-07-23 (×2): 1 via RESPIRATORY_TRACT
  Filled 2021-07-18 (×2): qty 28

## 2021-07-18 MED ORDER — FUROSEMIDE 10 MG/ML IJ SOLN
20.0000 mg | Freq: Once | INTRAMUSCULAR | Status: AC
  Administered 2021-07-18: 20 mg via INTRAVENOUS
  Filled 2021-07-18: qty 2

## 2021-07-18 MED ORDER — ONDANSETRON HCL 4 MG/2ML IJ SOLN: 4.0000 mg | Freq: Four times a day (QID) | INTRAMUSCULAR | Status: DC | PRN

## 2021-07-18 MED ORDER — ALUM & MAG HYDROXIDE-SIMETH 200-200-20 MG/5ML PO SUSP
15.0000 mL | ORAL | Status: DC | PRN
  Administered 2021-07-18: 15 mL via ORAL
  Filled 2021-07-18: qty 30

## 2021-07-18 MED ORDER — SODIUM CHLORIDE 0.9 % IV SOLN
1.0000 g | Freq: Once | INTRAVENOUS | Status: AC
  Administered 2021-07-18: 1 g via INTRAVENOUS
  Filled 2021-07-18: qty 10

## 2021-07-18 MED ORDER — SODIUM CHLORIDE 0.9 % IV SOLN
500.0000 mg | Freq: Once | INTRAVENOUS | Status: AC
  Administered 2021-07-18: 500 mg via INTRAVENOUS
  Filled 2021-07-18: qty 5

## 2021-07-18 MED ORDER — SODIUM CHLORIDE 0.9 % IV SOLN
500.0000 mg | Freq: Once | INTRAVENOUS | Status: AC
  Administered 2021-07-19: 500 mg via INTRAVENOUS
  Filled 2021-07-18: qty 5

## 2021-07-18 MED ORDER — ACETAMINOPHEN 325 MG PO TABS
650.0000 mg | ORAL_TABLET | Freq: Four times a day (QID) | ORAL | Status: DC | PRN
  Filled 2021-07-18: qty 2

## 2021-07-18 MED ORDER — HYDRALAZINE HCL 20 MG/ML IJ SOLN: 10.0000 mg | Freq: Four times a day (QID) | INTRAMUSCULAR | Status: DC | PRN

## 2021-07-18 MED ORDER — ACETAMINOPHEN 650 MG RE SUPP: 650.0000 mg | Freq: Four times a day (QID) | RECTAL | Status: DC | PRN

## 2021-07-18 NOTE — Progress Notes (Signed)
Heart Failure Navigator Progress Note  Assessed for Heart & Vascular TOC clinic readiness.  [facility] consulted this hospitalization. Pt currently on BiPAP. Positive for cocaine.    Navigator available for educational resources.  Ozella Rocks, MSN, RN Heart Failure Nurse Navigator [phone]

## 2021-07-18 NOTE — Assessment & Plan Note (Addendum)
-  Had respiratory distress with hypoxic respiratory failure requiring BiPAP on admission  -Secondary to pulmonary edema  -Also being treated for COPD exacerbation, cut down steroids, continue azithromycin, nebs -Weaning O2, check ambulatory sats, taper down steroids,

## 2021-07-18 NOTE — Progress Notes (Signed)
Pt transported from ER to 3E1 on BIPAP. No complications noted.

## 2021-07-18 NOTE — TOC Progression Note (Signed)
Transition of Care [facility]) - Progression Note    Patient Details  Name: JAYLEI FUERTE MRN: [id number] Date of Birth: 1950/08/04  Transition of Care [facility]) CM/SW Contact  Leone Haven, RN Phone Number: 07/18/2021, 1:11 PM  Clinical Narrative:     Transition of Care [facility]) Screening Note   Patient Details  Name: ROLANDA CAMPA Date of Birth: 02-Feb-1951   Transition of Care [facility]) CM/SW Contact:    Leone Haven, RN Phone Number: 07/18/2021, 1:11 PM    Transition of Care Department [facility]) has reviewed patient and no TOC needs have been identified at this time. We will continue to monitor patient advancement through interdisciplinary progression rounds. If new patient transition needs arise, please place a TOC consult.          Expected Discharge Plan and Services                                                 Social Determinants of Health (SDOH) Interventions    Readmission Risk Interventions No flowsheet data found.

## 2021-07-18 NOTE — Assessment & Plan Note (Addendum)
Likely secondary to demand ischemia, could have CAD as well -Cardiology following, stress test negative for inducible ischemia -Plan to repeat echo as outpatient and consider cath if EF does not improve

## 2021-07-18 NOTE — Assessment & Plan Note (Addendum)
Acute systolic and diastolic CHF Echo-EF 123XX123,, grade 3 diastolic dysfunction -Clinically improving with diuresis, appears euvolemic, transitioned to oral Lasix -Stress test yesterday negative for inducible ischemia, fixed defect noted -Started on Entresto, Aldactone, Corlanor -Kidney function stable, potassium trending up, will hold Aldactone dose today, give Lokelma, recheck BMP this afternoon -Wean O2, will need 2 L O2 at discharge

## 2021-07-18 NOTE — Progress Notes (Signed)
Patient seen and examined.  Admitted by nighttime hospitalist early morning hours.  Discussed with admitted at the bedside, patient interviewed.  She is currently on BiPAP and feels slightly better after being on BiPAP. In brief, 71 year old with history of hypertension, COPD, bipolar disorder, cocaine use presented with shortness of breath for 5 days, worse for 1 day.  He snorted cocaine yesterday and got worse.Patient was in respiratory distress when seen by EMS, started on BiPAP and still remains on BiPAP.  Mild elevated troponins.  Currently remains on BiPAP, will continue BiPAP until patient has clinical improvement.  Seen by cardiology.  Currently remains on Lasix.  Blood pressures are stable. Echocardiogram pending today.   Same-day admission.  No charge visit.

## 2021-07-18 NOTE — ED Provider Notes (Signed)
Patient care assumed at 2330.  Patient with history of COPD, cocaine abuse here for evaluation of shortness of breath.  She has been treated with Solu-Medrol, duo nebs.  Care assumed pending labs and x-ray.  X-ray with pulmonary edema, cardiomegaly, images personally reviewed.  On record review in epic she had a prior x-ray in 2018 and another facility that has a normal cardiac silhouette.  BNP is elevated and troponins are elevated but flat.  EKG does not have acute ischemic changes.  On assessment patient with diffuse crackles, occasional wheezes.  We will discontinue her albuterol and transition her to BiPAP for ongoing respiratory support.  Given her pulmonary edema low-dose of Lasix was ordered.  She does have a borderline blood pressure.  Discussed patient with Dr. Terri Skains with cardiology-recommends ongoing work-up for other etiologies of acute heart failure such as pulmonary embolism.  Repeat evaluation on BiPAP, patient continues to appear stable, states her breathing feels improved.  D-dimer was obtained, this is elevated and a CTA was obtained.  CTA is negative for PE but does demonstrate infiltrates worrisome for possible multifocal pneumonia.  Current clinical picture is more consistent with acute CHF over pneumonia but will send lactate, blood cultures and start empiric antibiotics.  Medicine consulted for admission for ongoing treatment.  Patient updated findings of studies and recommendation for admission and she is in agreement with treatment plan.  CRITICAL CARE Performed by: Quintella Reichert   Total critical care time: 45 minutes  Critical care time was exclusive of separately billable procedures and treating other patients.  Critical care was necessary to treat or prevent imminent or life-threatening deterioration.  Critical care was time spent personally by me on the following activities: development of treatment plan with patient and/or surrogate as well as nursing, discussions with  consultants, evaluation of patient's response to treatment, examination of patient, obtaining history from patient or surrogate, ordering and performing treatments and interventions, ordering and review of laboratory studies, ordering and review of radiographic studies, pulse oximetry and re-evaluation of patient's condition.    Quintella Reichert, MD 07/18/21 [phone]

## 2021-07-18 NOTE — Progress Notes (Signed)
Transported pt. To ct via bipap with no incident.

## 2021-07-18 NOTE — Assessment & Plan Note (Addendum)
•   Creatinine stable, monitor

## 2021-07-18 NOTE — Progress Notes (Signed)
VASCULAR LAB    Bilateral lower extremity venous duplex has been performed.  See CV proc for preliminary results.   Myrtle Barnhard, RVT 07/18/2021, 12:02 PM

## 2021-07-18 NOTE — Assessment & Plan Note (Deleted)
·   Please see assessment and plan above °

## 2021-07-18 NOTE — H&P (Signed)
History and Physical    Valerie Rodgers [id number] DOB: [birth year] DOA: 07/17/2021  PCP: Javier Docker, MD  Patient coming from: Home via EMS   Chief Complaint:  Chief Complaint  Patient presents with   Shortness of Breath     HPI:    71 year old female with past medical history of cocaine abuse, hypertension, bipolar disorder, COPD who presents to [facility] with complaints of shortness of breath via EMS.  Patient explains that approximately 5 days ago she began to experience shortness of breath.  Shortness of breath was initially mild intensity but progressively became more and more severe.  Patient also reports associated cough with white-colored sputum.  Patient denies associated chest pain.  Patient has an albuterol healing intent to use it multiple times over the course of illness without improvement.  Patient denies fevers, sick contacts, recent travel or positive contacts for COVID-19.  Patient reports longstanding cocaine use and feels that her symptoms seem to worsen every time she uses cocaine.  Last use earlier in the day on 1/29.  Patient symptoms continued to worsen over the next several days until EMS was contacted.  Upon EMS arrival patient was found to be in respiratory distress and was promptly brought into [facility] emergency permit for evaluation.  Upon evaluation in the emergency department patient was placed on BiPAP due to work of breathing.  A VBG was performed revealing a near normal pH however attempts to wean patient off of BiPAP have failed due to work of breathing.  Patient was found to have slightly elevated troponins of 156 and 143.  BNP was found to be markedly elevated at 2729.  Intravenous Lasix was administered.  ER provider discussed case with Dr. Terri Skains with cardiology who recommended work-up for various etiologies of patient's respiratory distress.  CT angiogram of the chest was performed revealing no evidence of pulmonary  embolism but revealing multifocal patchy opacities worst in the right lower lobe.  Hospitalist group was then called to assess the patient for admission to the hospital.   Review of Systems:   Review of Systems  Respiratory:  Positive for cough, sputum production, shortness of breath and wheezing.   Neurological:  Positive for weakness.   Past Medical History:  Diagnosis Date   Arthritis    Bipolar affective disorder ([place])    Cataract     Past Surgical History:  Procedure Laterality Date   ABDOMINAL HYSTERECTOMY       reports that she has been smoking. She has never used smokeless tobacco. She reports current alcohol use. She reports current drug use. Drug: Cocaine.  No Known Allergies  Family History  Problem Relation Age of Onset   Colon cancer Neg Hx    Heart disease Neg Hx      Prior to Admission medications   Medication Sig Start Date End Date Taking? Authorizing Provider  albuterol (PROVENTIL HFA;VENTOLIN HFA) 108 (90 Base) MCG/ACT inhaler  03/12/18   [provider]  amLODipine (NORVASC) 5 MG tablet Take 5 mg by mouth daily. 10/15/18   [provider]  ciclopirox (PENLAC) 8 % solution Apply topically at bedtime. Apply over nail and surrounding skin. Apply daily over previous coat. Remove weekly with file or polish remover. 11/08/18   Evelina Bucy, DPM  Flaxseed, Linseed, (FLAX SEED OIL PO) Take 1 capsule by mouth daily.    [provider]  lisinopril-hydrochlorothiazide (ZESTORETIC) 10-12.5 MG tablet  10/29/18   [provider]  Multiple  Vitamin (MULTIVITAMIN) tablet Take 1 tablet by mouth daily.    [provider]  SYMBICORT 160-4.5 MCG/ACT inhaler Inhale 2 puffs into the lungs 2 (two) times daily. 10/02/18   [provider]    Physical Exam: Vitals:   07/18/21 0533 07/18/21 0545 07/18/21 0600 07/18/21 0645  BP: 100/61 100/65 96/65 98/71   Pulse: 91 74 (!) 36 66  Resp: (!) 26 (!) 29 (!) 21 (!) 25  Temp:       TempSrc:      SpO2: 95% 96% 94% 92%  Weight:      Height:        Constitutional: Awake alert and oriented x3, no associated distress.   Skin: no rashes, no lesions, good skin turgor noted. Eyes: Pupils are equally reactive to light.  No evidence of scleral icterus or conjunctival pallor.  ENMT: Moist mucous membranes noted.  Posterior pharynx clear of any exudate or lesions.   Neck: normal, supple, no masses, no thyromegaly.  No evidence of jugular venous distension.   Respiratory: clear to auscultation bilaterally, no wheezing, no crackles. Normal respiratory effort. No accessory muscle use.  Cardiovascular: Regular rate and rhythm, no murmurs / rubs / gallops. No extremity edema. 2+ pedal pulses. No carotid bruits.  Chest:   Nontender without crepitus or deformity.   Back:   Nontender without crepitus or deformity. Abdomen: Abdomen is soft and nontender.  No evidence of intra-abdominal masses.  Positive bowel sounds noted in all quadrants.   Musculoskeletal: No joint deformity upper and lower extremities. Good ROM, no contractures. Normal muscle tone.  Neurologic: CN 2-12 grossly intact. Sensation intact.  Patient moving all 4 extremities spontaneously.  Patient is following all commands.  Patient is responsive to verbal stimuli.   Psychiatric: Patient exhibits normal mood with appropriate affect.  Patient seems to possess insight as to their current situation.     Labs on Admission: I have personally reviewed following labs and imaging studies -   CBC: Recent Labs  Lab 07/17/21 2206 07/17/21 2349  WBC 14.2*  --   NEUTROABS 12.0*  --   HGB 13.6 13.9  HCT 39.9 41.0  MCV 84.4  --   PLT 273  --    Basic Metabolic Panel: Recent Labs  Lab 07/17/21 2206 07/17/21 2349  NA 137 141  K 4.2 3.8  CL 101  --   CO2 24  --   GLUCOSE 118*  --   BUN 19  --   CREATININE 1.21*  --   CALCIUM 8.9  --    GFR: Estimated Creatinine Clearance: 42.5 mL/min (A) (by C-G formula based on SCr  of 1.21 mg/dL (H)). Liver Function Tests: Recent Labs  Lab 07/18/21 0116  AST 52*  ALT 63*  ALKPHOS 122  BILITOT 1.0  PROT 6.1*  ALBUMIN 2.4*   No results for input(s): LIPASE, AMYLASE in the last 168 hours. No results for input(s): AMMONIA in the last 168 hours. Coagulation Profile: No results for input(s): INR, PROTIME in the last 168 hours. Cardiac Enzymes: No results for input(s): CKTOTAL, CKMB, CKMBINDEX, TROPONINI in the last 168 hours. BNP (last 3 results) No results for input(s): PROBNP in the last 8760 hours. HbA1C: No results for input(s): HGBA1C in the last 72 hours. CBG: No results for input(s): GLUCAP in the last 168 hours. Lipid Profile: No results for input(s): CHOL, HDL, LDLCALC, TRIG, CHOLHDL, LDLDIRECT in the last 72 hours. Thyroid Function Tests: No results for input(s): TSH, T4TOTAL, FREET4, T3FREE, THYROIDAB  in the last 72 hours. Anemia Panel: No results for input(s): VITAMINB12, FOLATE, FERRITIN, TIBC, IRON, RETICCTPCT in the last 72 hours. Urine analysis: No results found for: COLORURINE, APPEARANCEUR, LABSPEC, PHURINE, GLUCOSEU, HGBUR, BILIRUBINUR, KETONESUR, PROTEINUR, UROBILINOGEN, NITRITE, LEUKOCYTESUR  Radiological Exams on Admission - Personally Reviewed: CT Angio Chest PE W/Cm &/Or Wo Cm  Result Date: 07/18/2021 CLINICAL DATA:  Shortness of breath, hypoxia EXAM: CT ANGIOGRAPHY CHEST WITH CONTRAST TECHNIQUE: Multidetector CT imaging of the chest was performed using the standard protocol during bolus administration of intravenous contrast. Multiplanar CT image reconstructions and MIPs were obtained to evaluate the vascular anatomy. RADIATION DOSE REDUCTION: This exam was performed according to the departmental dose-optimization program which includes automated exposure control, adjustment of the mA and/or kV according to patient size and/or use of iterative reconstruction technique. CONTRAST:  43mL OMNIPAQUE IOHEXOL 350 MG/ML SOLN COMPARISON:  Chest  radiograph dated 07/17/2021 FINDINGS: Cardiovascular: Satisfactory opacification the bilateral pulmonary arteries to the segmental level. No evidence of pulmonary embolism. Study is not tailored for evaluation of the thoracic aorta. No evidence of thoracic aortic aneurysm. Cardiomegaly.  No pericardial effusion. Mild coronary atherosclerosis of the LAD and left circumflex. Mediastinum/Nodes: No suspicious mediastinal lymphadenopathy. Visualized thyroid is unremarkable. Lungs/Pleura: Multifocal patchy opacities, right lower lobe predominant. This appearance favors multifocal pneumonia over interstitial edema. Trace pleural fluid bilaterally. No suspicious pulmonary nodules. Mild centrilobular and paraseptal emphysematous changes, upper lung predominant. No pneumothorax. Upper Abdomen: Visualized upper abdomen is grossly unremarkable, noting vascular calcifications. Musculoskeletal: Mild degenerative changes of the mid thoracic spine. Review of the MIP images confirms the above findings. IMPRESSION: No evidence of pulmonary embolism. Multifocal patchy opacities, right lower lobe predominant, favoring multifocal pneumonia over interstitial edema. Emphysema (ICD10-J43.9). Electronically Signed   By: Julian Hy M.D.   On: 07/18/2021 03:13   DG Chest Portable 1 View  Result Date: 07/17/2021 CLINICAL DATA:  Shortness of breath. EXAM: PORTABLE CHEST 1 VIEW COMPARISON:  None. FINDINGS: The heart is enlarged. Diffuse interstitial and airspace opacities are seen throughout both lungs and central predominance. No pleural effusion or pneumothorax. No acute fractures. IMPRESSION: 1. Cardiomegaly. 2. Findings suggestive of moderate pulmonary edema. Infection not excluded Electronically Signed   By: Ronney Asters M.D.   On: 07/17/2021 22:28    EKG: Personally reviewed.  Rhythm is normal sinus rhythm with heart rate of 92 bpm.  PVCs noted.  No dynamic ST segment changes appreciated.  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 Terri Skains with radiology was contacted by the emergency department last night will consult on the patient this morning.  Their input is appreciated. Remainder of assessment and plan as above  Acute respiratory failure with hypoxia ([place])- (present on admission) Patient suffering from acute hypoxic respiratory failure with respiratory distress requiring initiation of noninvasive positive pressure ventilation upon arrival in the emergency department VBG performed while patient was on BiPAP reveals no evidence of concurrent hypercapnia Etiology is likely acute cardiogenic pulmonary edema likely secondary to cocaine induced cardiomyopathy as evidenced by markedly elevated BNP and elevated troponin Patient additionally presents is a leukocytosis with multiple SIRS criteria, and atypical bilateral pneumonia is possible but much less likely Patient is currently on BiPAP and receiving intravenous diuretics Concurrently we will treat patient with antibiotics for now however procalcitonin has been ordered and if this is normal antibiotics can be discontinued Monitoring patient closely in progressive unit due to high risk of rapid  clinical decompensation As needed bronchodilator therapy N.p.o. for now until respiratory status is more stable and patient is off BiPAP  Acute cardiogenic pulmonary edema (HCC)- (present on admission) Please see assessment and plan above  Elevated troponin level not due myocardial infarction- (present on admission) Slightly elevated troponins with flat trajectory of elevation Unlikely be secondary to plaque rupture Likely secondary to supply demand mismatch in the setting of acute congestive heart failure and acute cardiogenic pulmonary edema with recent cocaine use Monitoring patient on telemetry Echocardiogram  Cocaine abuse ([place])-  (present on admission) Counseling patient on cessation daily Patient reports last use of cocaine was on 1/29  Chronic kidney disease, stage 3a ([place])- (present on admission) Strict intake and output monitoring Creatinine near baseline compared to previous value several years ago Minimizing nephrotoxic agents as much as possible Serial chemistries to monitor renal function and electrolytes   Essential hypertension- (present on admission) While patient has a documented history of hypertension patient is currently normotensive, possibly secondary to advancement of cardiomyopathy Discontinuing all home antihypertensives for now As needed intravenous and hypertensives if patient develops markedly elevated blood pressures We will resume blood pressure medications as able if indicated       Code Status:  Full code  code status decision has been confirmed with: patient Family Communication: deferred   Status is: Inpatient  Remains inpatient appropriate because: Acute hypoxic respiratory failure requiring noninvasive positive pressure ventilation secondary to acute cardiogenic pulmonary edema with extremely high risk of rapid clinical decompensation, intravenous diuretics and expert consultation.  CRITICAL CARE ATTESTATION  Significant risk of morbidity and mortality secondary to acute hypoxic respiratory failure secondary to Acute cardiogenic pulmonary edema and acute congestive heart failure requiring coordination with specialists, titration of BiPAP therapy, review of blood gas results and review of imaging.  Critical care time spent 40 minutes        Vernelle Emerald MD [facility] Pager [phone]  If 7PM-7AM, please contact night-coverage [email or web address] Use universal [place] password for that web site. If you do not have the password, please call the hospital operator.  07/18/2021, 7:31 AM

## 2021-07-18 NOTE — Progress Notes (Signed)
° °  Echocardiogram 2D Echocardiogram has been performed.  Festus Barren 07/18/2021, 1:07 PM

## 2021-07-18 NOTE — Consult Note (Addendum)
CARDIOLOGY CONSULT NOTE  Patient ID: REQUITA RINEER MRN: [id number] DOB/AGE: 72-Mar-1952 72 y.o.  Admit date: 07/17/2021 Referring Physician: [facility] hospitalist Reason for Consultation:  CHF  HPI:   71 y.o. African American female  with hypertension, COPD, bipolar disorder, recent cocaine abuse, admitted with progressive dyspnea. Workup in ER showed mildly elevated trop, BNP 2700, UDS positive for cocaine, CXR showing pulmonary edema.  Infectious work-up was largely negative.  AST/ALT mildly elevated, low albumin.  This morning, patient was still on BiPAP, but was able to converse without overt shortness of breath.  She tells me she is experience this dyspnea for last 5 days.  She denies any chest pain, leg edema, significant orthopnea or PND.  She reports that she used to use cocaine regularly, but only uses it occasionally now.  She drinks alcohol occasionally.  She does smoke tobacco, reportedly 2 cigarettes a day.  Past Medical History:  Diagnosis Date   Arthritis    Bipolar affective disorder ([place])    Cataract      Past Surgical History:  Procedure Laterality Date   ABDOMINAL HYSTERECTOMY        Family History  Problem Relation Age of Onset   Colon cancer Neg Hx      Social History: Social History   Socioeconomic History   Marital status: Single    Spouse name: Not on file   Number of children: Not on file   Years of education: Not on file   Highest education level: Not on file  Occupational History   Not on file  Tobacco Use   Smoking status: Some Days   Smokeless tobacco: Never  Substance and Sexual Activity   Alcohol use: Yes    Alcohol/week: 0.0 standard drinks    Comment: very seldom   Drug use: Yes    Types: Cocaine   Sexual activity: Not on file  Other Topics Concern   Not on file  Social History Narrative   Not on file   Social Determinants of Health   Financial Resource Strain: Not on file  Food Insecurity: Not on file   Transportation Needs: Not on file  Physical Activity: Not on file  Stress: Not on file  Social Connections: Not on file  Intimate Partner Violence: Not on file     (Not in a hospital admission)   Review of Systems  Constitutional: Positive for chills and malaise/fatigue. Negative for decreased appetite, fever, weight gain and weight loss.  HENT:  Negative for congestion.   Eyes:  Negative for visual disturbance.  Cardiovascular:  Positive for dyspnea on exertion. Negative for chest pain, leg swelling, palpitations and syncope.  Respiratory:  Positive for shortness of breath. Negative for cough.   Endocrine: Negative for cold intolerance.  Hematologic/Lymphatic: Does not bruise/bleed easily.  Skin:  Negative for itching and rash.  Musculoskeletal:  Negative for myalgias.  Gastrointestinal:  Negative for abdominal pain, nausea and vomiting.  Genitourinary:  Negative for dysuria.  Neurological:  Negative for dizziness and weakness.  Psychiatric/Behavioral:  The patient is not nervous/anxious.   All other systems reviewed and are negative.    Physical Exam: Physical Exam Vitals and nursing note reviewed.  Constitutional:      General: She is not in acute distress.    Appearance: She is well-developed.  HENT:     Head: Normocephalic and atraumatic.  Eyes:     Conjunctiva/sclera: Conjunctivae normal.     Pupils: Pupils are equal, round, and reactive to light.  Neck:     Vascular: No JVD.  Cardiovascular:     Rate and Rhythm: Normal rate and regular rhythm. FrequentExtrasystoles are present.    Pulses: Normal pulses and intact distal pulses.     Heart sounds: No murmur heard. Pulmonary:     Effort: Respiratory distress (Mild) present.     Breath sounds: Examination of the right-lower field reveals rhonchi and rales. Examination of the left-lower field reveals rhonchi and rales. Rhonchi and rales present. No wheezing.  Abdominal:     General: Bowel sounds are normal.      Palpations: Abdomen is soft.     Tenderness: There is no rebound.  Musculoskeletal:        General: No tenderness. Normal range of motion.     Right lower leg: No edema.     Left lower leg: No edema.  Lymphadenopathy:     Cervical: No cervical adenopathy.  Skin:    General: Skin is warm and dry.  Neurological:     Mental Status: She is alert and oriented to person, place, and time.     Cranial Nerves: No cranial nerve deficit.     Labs:   Lab Results  Component Value Date   WBC 14.2 (H) 07/17/2021   HGB 13.9 07/17/2021   HCT 41.0 07/17/2021   MCV 84.4 07/17/2021   PLT 273 07/17/2021    Recent Labs  Lab 07/17/21 2206 07/17/21 2349 07/18/21 0116  NA 137 141  --   K 4.2 3.8  --   CL 101  --   --   CO2 24  --   --   BUN 19  --   --   CREATININE 1.21*  --   --   CALCIUM 8.9  --   --   PROT  --   --  6.1*  BILITOT  --   --  1.0  ALKPHOS  --   --  122  ALT  --   --  63*  AST  --   --  52*  GLUCOSE 118*  --   --     Lipid Panel     Component Value Date/Time   CHOL 171 05/24/2009 2044   TRIG 92 05/24/2009 2044   HDL 56 05/24/2009 2044   CHOLHDL 3.1 Ratio 05/24/2009 2044   VLDL 18 05/24/2009 2044   LDLCALC 97 05/24/2009 2044    BNP (last 3 results) Recent Labs    07/17/21 2206  BNP 2,729.9*    Latest Reference Range & Units 07/17/21 22:06 07/18/21 00:30  Troponin I (High Sensitivity) <18 ng/L 156 (HH) 143 (HH)  (HH): Data is critically high   Radiology: CT Angio Chest PE W/Cm &/Or Wo Cm  Result Date: 07/18/2021 CLINICAL DATA:  Shortness of breath, hypoxia EXAM: CT ANGIOGRAPHY CHEST WITH CONTRAST TECHNIQUE: Multidetector CT imaging of the chest was performed using the standard protocol during bolus administration of intravenous contrast. Multiplanar CT image reconstructions and MIPs were obtained to evaluate the vascular anatomy. RADIATION DOSE REDUCTION: This exam was performed according to the departmental dose-optimization program which includes automated  exposure control, adjustment of the mA and/or kV according to patient size and/or use of iterative reconstruction technique. CONTRAST:  70mL OMNIPAQUE IOHEXOL 350 MG/ML SOLN COMPARISON:  Chest radiograph dated 07/17/2021 FINDINGS: Cardiovascular: Satisfactory opacification the bilateral pulmonary arteries to the segmental level. No evidence of pulmonary embolism. Study is not tailored for evaluation of the thoracic aorta. No evidence of thoracic aortic aneurysm. Cardiomegaly.  No pericardial effusion. Mild coronary atherosclerosis of the LAD and left circumflex. Mediastinum/Nodes: No suspicious mediastinal lymphadenopathy. Visualized thyroid is unremarkable. Lungs/Pleura: Multifocal patchy opacities, right lower lobe predominant. This appearance favors multifocal pneumonia over interstitial edema. Trace pleural fluid bilaterally. No suspicious pulmonary nodules. Mild centrilobular and paraseptal emphysematous changes, upper lung predominant. No pneumothorax. Upper Abdomen: Visualized upper abdomen is grossly unremarkable, noting vascular calcifications. Musculoskeletal: Mild degenerative changes of the mid thoracic spine. Review of the MIP images confirms the above findings. IMPRESSION: No evidence of pulmonary embolism. Multifocal patchy opacities, right lower lobe predominant, favoring multifocal pneumonia over interstitial edema. Emphysema (ICD10-J43.9). Electronically Signed   By: Julian Hy M.D.   On: 07/18/2021 03:13   DG Chest Portable 1 View  Result Date: 07/17/2021 CLINICAL DATA:  Shortness of breath. EXAM: PORTABLE CHEST 1 VIEW COMPARISON:  None. FINDINGS: The heart is enlarged. Diffuse interstitial and airspace opacities are seen throughout both lungs and central predominance. No pleural effusion or pneumothorax. No acute fractures. IMPRESSION: 1. Cardiomegaly. 2. Findings suggestive of moderate pulmonary edema. Infection not excluded Electronically Signed   By: Ronney Asters M.D.   On:  07/17/2021 22:28    Scheduled Meds:  ipratropium-albuterol  3 mL Nebulization Q4H   Continuous Infusions:  albuterol 10 mg/hr (07/17/21 2310)   azithromycin 500 mg (07/18/21 0602)   PRN Meds:.  CARDIAC STUDIES:  EKG 07/17/2021: Sinus rhythm 92 bpm Frequent PVCs Nonspecific T wave changes   Echocardiogram: Pending   Assessment & Recommendations:  71 y.o. African American female  with hypertension, COPD, bipolar disorder, recent cocaine abuse, admitted with acute hypoxic respiratory failure, congestive heart failure, cocaine positive.   Acute hypoxic respiratory failure: Likely due to new congestive heart failure, echocardiogram pending to determine HFrEF or HFpEF.  Mild respiratory distress, JVD on exam.  No pedal edema.  Recommend diuresis with IV Lasix 40 mg twice daily today.  Currently on patient reportedly on amlodipine 5 mg, lisinopril-hydrochlorothiazide 10-12.5 mg daily at home.  Unsure of compliance. Recommend holding these medications at this time.  After adequate diuresis and adequate washout of lisinopril, could consider adding Entresto. Strict I's/O, daily weights No immediate plans for coronary angiogram at this time.  We will discuss after reviewing echocardiogram. Troponin elevation likely secondary to heart failure. Counseled the patient regarding need for strict abstinence from cocaine.    Abnormal LFT: Mildly elevated liver enzymes, with mildly synthetic function. Differentials include congestive hepatopathy, versus alternate hepatic pathology. Agree with ultrasound liver.      Nigel Mormon, MD Pager: [phone] Office: [phone]

## 2021-07-18 NOTE — ED Provider Notes (Signed)
[facility] EMERGENCY DEPARTMENT Provider Note   CSN: [id number] Arrival date & time: 07/17/21  2043     History  Chief Complaint  Patient presents with   Shortness of Breath    Valerie Rodgers is a 71 y.o. female.  Presents to ER with concern for shortness of breath.  Patient reports that she has a history of COPD, uses inhaler as needed.  States that over the last few days she has been having progressive difficulty breathing.  Recently did crack cocaine.  States that this seems to have made her symptoms worse.  Has tried using her inhaler at home with minimal relief.  Has had cough, color of sputum is white.  No chills or fevers.  Denies chest pain or difficulty in breathing.  She denies any history of heart disease.  HPI     Home Medications Prior to Admission medications   Medication Sig Start Date End Date Taking? Authorizing Provider  albuterol (PROVENTIL HFA;VENTOLIN HFA) 108 (90 Base) MCG/ACT inhaler  03/12/18   [provider]  amLODipine (NORVASC) 5 MG tablet Take 5 mg by mouth daily. 10/15/18   [provider]  ciclopirox (PENLAC) 8 % solution Apply topically at bedtime. Apply over nail and surrounding skin. Apply daily over previous coat. Remove weekly with file or polish remover. 11/08/18   Evelina Bucy, DPM  Flaxseed, Linseed, (FLAX SEED OIL PO) Take 1 capsule by mouth daily.    [provider]  lisinopril-hydrochlorothiazide (ZESTORETIC) 10-12.5 MG tablet  10/29/18   [provider]  Multiple Vitamin (MULTIVITAMIN) tablet Take 1 tablet by mouth daily.    [provider]  SYMBICORT 160-4.5 MCG/ACT inhaler Inhale 2 puffs into the lungs 2 (two) times daily. 10/02/18   [provider]      Allergies    Patient has no known allergies.    Review of Systems   Review of Systems  Constitutional:  Positive for fatigue. Negative for chills and fever.  HENT:  Negative for ear pain and sore throat.    Eyes:  Negative for pain and visual disturbance.  Respiratory:  Positive for cough and shortness of breath.   Cardiovascular:  Negative for chest pain and palpitations.  Gastrointestinal:  Negative for abdominal pain and vomiting.  Genitourinary:  Negative for dysuria and hematuria.  Musculoskeletal:  Negative for arthralgias and back pain.  Skin:  Negative for color change and rash.  Neurological:  Negative for seizures and syncope.  All other systems reviewed and are negative.  Physical Exam Updated Vital Signs BP 125/86    Pulse (!) 108    Temp 98.6 F (37 C) (Axillary)    Resp (!) 38    Ht 5\' 4"  (1.626 m)    Wt 75.8 kg    SpO2 93%    BMI 28.67 kg/m  Physical Exam Vitals and nursing note reviewed.  Constitutional:      Comments: Alert, good mental status but in moderate respiratory distress  HENT:     Head: Normocephalic and atraumatic.  Eyes:     Conjunctiva/sclera: Conjunctivae normal.  Cardiovascular:     Rate and Rhythm: Regular rhythm. Tachycardia present.     Heart sounds: No murmur heard. Pulmonary:     Comments: Moderate tachypnea, some increased work of breathing but speaking in full sentences, bilateral expiratory wheezing and slight crackles noted Abdominal:     Palpations: Abdomen is soft.     Tenderness: There is no abdominal tenderness.  Musculoskeletal:  General: No swelling.     Cervical back: Neck supple.  Skin:    General: Skin is warm and dry.     Capillary Refill: Capillary refill takes less than 2 seconds.  Neurological:     General: No focal deficit present.     Mental Status: She is alert and oriented to person, place, and time.  Psychiatric:        Mood and Affect: Mood normal.    ED Results / Procedures / Treatments   Labs (all labs ordered are listed, but only abnormal results are displayed) Labs Reviewed  CBC WITH DIFFERENTIAL/PLATELET - Abnormal; Notable for the following components:      Result Value   WBC 14.2 (*)    Neutro  Abs 12.0 (*)    Monocytes Absolute 1.2 (*)    Abs Immature Granulocytes 0.10 (*)    All other components within normal limits  BASIC METABOLIC PANEL - Abnormal; Notable for the following components:   Glucose, Bld 118 (*)    Creatinine, Ser 1.21 (*)    GFR, Estimated 48 (*)    All other components within normal limits  BRAIN NATRIURETIC PEPTIDE - Abnormal; Notable for the following components:   B Natriuretic Peptide 2,729.9 (*)    All other components within normal limits  I-STAT VENOUS BLOOD GAS, ED - Abnormal; Notable for the following components:   Bicarbonate 29.8 (*)    Acid-Base Excess 3.0 (*)    All other components within normal limits  TROPONIN I (HIGH SENSITIVITY) - Abnormal; Notable for the following components:   Troponin I (High Sensitivity) 156 (*)    All other components within normal limits  RESP PANEL BY RT-PCR (FLU A&B, COVID) [id number]  TROPONIN I (HIGH SENSITIVITY)    EKG EKG Interpretation  Date/Time:  Sunday July 17 2021 20:44:20 EST Ventricular Rate:  92 PR Interval:  181 QRS Duration: 93 QT Interval:  368 QTC Calculation: 456 R Axis:   18 Text Interpretation: Sinus tachycardia Multiform ventricular premature complexes Nonspecific T abnormalities, lateral leads Confirmed by Marianna Fuss ([id number]) on 07/17/2021 11:22:43 PM  Radiology DG Chest Portable 1 View  Result Date: 07/17/2021 CLINICAL DATA:  Shortness of breath. EXAM: PORTABLE CHEST 1 VIEW COMPARISON:  None. FINDINGS: The heart is enlarged. Diffuse interstitial and airspace opacities are seen throughout both lungs and central predominance. No pleural effusion or pneumothorax. No acute fractures. IMPRESSION: 1. Cardiomegaly. 2. Findings suggestive of moderate pulmonary edema. Infection not excluded Electronically Signed   By: Darliss Cheney M.D.   On: 07/17/2021 22:28    Procedures .Critical Care Performed by: Milagros Loll, MD Authorized by: Milagros Loll, MD   Critical care  provider statement:    Critical care time (minutes):  35   Critical care was necessary to treat or prevent imminent or life-threatening deterioration of the following conditions:  Respiratory failure   Critical care was time spent personally by me on the following activities:  Development of treatment plan with patient or surrogate, discussions with consultants, evaluation of patient's response to treatment, examination of patient, ordering and review of laboratory studies, ordering and review of radiographic studies, ordering and performing treatments and interventions, pulse oximetry, re-evaluation of patient's condition and review of old charts    Medications Ordered in ED Medications  albuterol (PROVENTIL) (2.5 MG/3ML) 0.083% nebulizer solution (10 mg/hr Nebulization New Bag/Given 07/17/21 2310)  ipratropium-albuterol (DUONEB) 0.5-2.5 (3) MG/3ML nebulizer solution 3 mL (has no administration in time range)  furosemide (LASIX) injection  20 mg (has no administration in time range)  ipratropium (ATROVENT) nebulizer solution 0.5 mg (0.5 mg Nebulization Given 07/17/21 2311)  methylPREDNISolone sodium succinate (SOLU-MEDROL) 125 mg/2 mL injection 125 mg (125 mg Intravenous Given 07/17/21 2228)    ED Course/ Medical Decision Making/ A&P                           Medical Decision Making Amount and/or Complexity of Data Reviewed Labs: ordered. Radiology: ordered.  Risk Prescription drug management.   71 year old lady who reported history of COPD presenting to ER with concern for worsening shortness of breath.  On exam patient noted to be tachypneic, increased work of breathing but has good mental status and is speaking in near full sentences.  Noted bilateral wheezing with some crackles.  Based on her reported history, concern for COPD exacerbation initially.  Also obtained cardiac work-up, check chest x-ray, EKG, troponin and BNP.  EKG without obvious ischemic change, no acute STEMI.  Ordered  continuous albuterol treatment, ipratropium and Solu-Medrol.  At time of signout, reassessed patient, discussed with Dr. Ralene Bathe.  Patient not improving with the albuterol treatment, BNP profoundly elevated, cardiomegaly on CXR with pulmonary edema.  Independently reviewed CXR and agree with radiology report.  Raising concern for heart failure as being more likely etiology. Rees initiating BiPAP therapy and will start diuresis.  She has assumed care and will admit patient.        Final Clinical Impression(s) / ED Diagnoses Final diagnoses:  Acute on chronic respiratory failure with hypoxia (HCC)  Chronic obstructive pulmonary disease with acute exacerbation (HCC)  Acute heart failure, unspecified heart failure type ([place])  Elevated troponin    Rx / DC Orders ED Discharge Orders     None         Lucrezia Starch, MD 07/18/21 [phone]

## 2021-07-18 NOTE — ED Notes (Signed)
Purewick replaced by this tech due to patient pulling it out and dropping on the floor. Pt tolerated well.

## 2021-07-18 NOTE — Assessment & Plan Note (Signed)
·   Counseling patient on cessation daily  Patient reports last use of cocaine was on 1/29

## 2021-07-18 NOTE — Assessment & Plan Note (Addendum)
·   Stable, heart failure meds as noted above

## 2021-07-19 ENCOUNTER — Inpatient Hospital Stay (HOSPITAL_COMMUNITY): Payer: Medicare Other

## 2021-07-19 DIAGNOSIS — F1410 Cocaine abuse, uncomplicated: Secondary | ICD-10-CM

## 2021-07-19 DIAGNOSIS — I5041 Acute combined systolic (congestive) and diastolic (congestive) heart failure: Secondary | ICD-10-CM

## 2021-07-19 DIAGNOSIS — I10 Essential (primary) hypertension: Secondary | ICD-10-CM

## 2021-07-19 DIAGNOSIS — F319 Bipolar disorder, unspecified: Secondary | ICD-10-CM | POA: Diagnosis not present

## 2021-07-19 DIAGNOSIS — N1831 Chronic kidney disease, stage 3a: Secondary | ICD-10-CM | POA: Diagnosis not present

## 2021-07-19 DIAGNOSIS — R7989 Other specified abnormal findings of blood chemistry: Secondary | ICD-10-CM

## 2021-07-19 DIAGNOSIS — J9601 Acute respiratory failure with hypoxia: Secondary | ICD-10-CM

## 2021-07-19 LAB — COMPREHENSIVE METABOLIC PANEL
ALT: 66 U/L — ABNORMAL HIGH (ref 0–44)
AST: 56 U/L — ABNORMAL HIGH (ref 15–41)
Albumin: 2.6 g/dL — ABNORMAL LOW (ref 3.5–5.0)
Alkaline Phosphatase: 132 U/L — ABNORMAL HIGH (ref 38–126)
Anion gap: 14 (ref 5–15)
BUN: 27 mg/dL — ABNORMAL HIGH (ref 8–23)
CO2: 29 mmol/L (ref 22–32)
Calcium: 9 mg/dL (ref 8.9–10.3)
Chloride: 103 mmol/L (ref 98–111)
Creatinine, Ser: 1.24 mg/dL — ABNORMAL HIGH (ref 0.44–1.00)
GFR, Estimated: 47 mL/min — ABNORMAL LOW (ref >60)
Glucose, Bld: 101 mg/dL — ABNORMAL HIGH (ref 70–99)
Potassium: 3.8 mmol/L (ref 3.5–5.1)
Sodium: 146 mmol/L — ABNORMAL HIGH (ref 135–145)
Total Bilirubin: 0.8 mg/dL (ref 0.3–1.2)
Total Protein: 6.8 g/dL (ref 6.5–8.1)

## 2021-07-19 LAB — CBC WITH DIFFERENTIAL/PLATELET
Abs Immature Granulocytes: 0.07 10*3/uL (ref 0.00–0.07)
Basophils Absolute: 0 10*3/uL (ref 0.0–0.1)
Basophils Relative: 0 %
Eosinophils Absolute: 0 10*3/uL (ref 0.0–0.5)
Eosinophils Relative: 0 %
HCT: 44.7 % (ref 36.0–46.0)
Hemoglobin: 14.9 g/dL (ref 12.0–15.0)
Immature Granulocytes: 0 %
Lymphocytes Relative: 7 %
Lymphs Abs: 1.2 10*3/uL (ref 0.7–4.0)
MCH: 28.1 pg (ref 26.0–34.0)
MCHC: 33.3 g/dL (ref 30.0–36.0)
MCV: 84.3 fL (ref 80.0–100.0)
Monocytes Absolute: 1.1 10*3/uL — ABNORMAL HIGH (ref 0.1–1.0)
Monocytes Relative: 7 %
Neutro Abs: 14.1 10*3/uL — ABNORMAL HIGH (ref 1.7–7.7)
Neutrophils Relative %: 86 %
Platelets: 242 10*3/uL (ref 150–400)
RBC: 5.3 MIL/uL — ABNORMAL HIGH (ref 3.87–5.11)
RDW: 14.5 % (ref 11.5–15.5)
WBC: 16.5 10*3/uL — ABNORMAL HIGH (ref 4.0–10.5)
nRBC: 0 % (ref 0.0–0.2)

## 2021-07-19 LAB — MAGNESIUM: Magnesium: 2.4 mg/dL (ref 1.7–2.4)

## 2021-07-19 MED ORDER — METHYLPREDNISOLONE SODIUM SUCC 125 MG IJ SOLR
80.0000 mg | Freq: Every day | INTRAMUSCULAR | Status: DC
  Administered 2021-07-19 – 2021-07-20 (×2): 80 mg via INTRAVENOUS
  Filled 2021-07-19 (×2): qty 2

## 2021-07-19 MED ORDER — IPRATROPIUM-ALBUTEROL 0.5-2.5 (3) MG/3ML IN SOLN
3.0000 mL | Freq: Four times a day (QID) | RESPIRATORY_TRACT | Status: DC
  Administered 2021-07-19 – 2021-07-20 (×6): 3 mL via RESPIRATORY_TRACT
  Filled 2021-07-19 (×6): qty 3

## 2021-07-19 MED ORDER — ALBUTEROL SULFATE (2.5 MG/3ML) 0.083% IN NEBU: 2.5000 mg | INHALATION_SOLUTION | RESPIRATORY_TRACT | Status: DC | PRN

## 2021-07-19 MED ORDER — SPIRONOLACTONE 12.5 MG HALF TABLET
12.5000 mg | ORAL_TABLET | Freq: Every day | ORAL | Status: DC
  Administered 2021-07-19 – 2021-07-21 (×3): 12.5 mg via ORAL
  Filled 2021-07-19 (×3): qty 1

## 2021-07-19 MED ORDER — AZITHROMYCIN 250 MG PO TABS
250.0000 mg | ORAL_TABLET | Freq: Every day | ORAL | Status: DC
  Administered 2021-07-19 – 2021-07-23 (×5): 250 mg via ORAL
  Filled 2021-07-19 (×5): qty 1

## 2021-07-19 NOTE — Progress Notes (Signed)
RT placed pt back on BiPAP at this time, pt did not tolerate trial off. Pt desat into low 80's and sustained, pt back on BiPAP and tolerating well at this time

## 2021-07-19 NOTE — Progress Notes (Addendum)
Subjective:  Breathing improved  Unsure if I/O accurate  Patient was on BiPAP this morning, transition to nasal cannula.  Requiring at least 6 L to maintain oxygen saturation >90%  Objective:  Vital Signs in the last 24 hours: Temp:  [97.7 F (36.5 C)-98.3 F (36.8 C)] 98.2 F (36.8 C) (01/31 0517) Pulse Rate:  [68-91] 72 (01/31 0316) Resp:  [20-30] 23 (01/31 0539) BP: (94-113)/(60-80) 108/67 (01/31 0539) SpO2:  [87 %-97 %] 90 % (01/31 0831) Weight:  [70.4 kg-72.4 kg] 70.4 kg (01/31 0517)  Intake/Output from previous day: 01/30 0701 - 01/31 0700 In: 170.4 [P.O.:50; IV Piggyback:120.4] Out: 950 [Urine:950]  Physical Exam Vitals and nursing note reviewed.  Constitutional:      General: She is not in acute distress. Neck:     Vascular: JVD present.  Cardiovascular:     Rate and Rhythm: Normal rate and regular rhythm.     Heart sounds: Normal heart sounds. No murmur heard. Pulmonary:     Effort: Respiratory distress (Mild) present.     Breath sounds: Examination of the right-lower field reveals rhonchi and rales. Examination of the left-lower field reveals rhonchi and rales. Decreased breath sounds, rhonchi and rales present. No wheezing.  Musculoskeletal:     Right lower leg: No edema.     Left lower leg: No edema.     Lab Results: Reviewed and interpreted:  Latest Reference Range & Units 07/17/21 22:06 07/17/21 23:49 07/18/21 00:30 07/18/21 01:16 07/19/21 01:30  Sodium 135 - 145 mmol/L 137 141   146 (H)  Potassium 3.5 - 5.1 mmol/L 4.2 3.8   3.8  Chloride 98 - 111 mmol/L 101    103  CO2 22 - 32 mmol/L 24    29  Glucose 70 - 99 mg/dL 118 (H)    101 (H)  BUN 8 - 23 mg/dL 19    27 (H)  Creatinine 0.44 - 1.00 mg/dL 1.21 (H)    1.24 (H)  Calcium 8.9 - 10.3 mg/dL 8.9    9.0  Anion gap 5 - 15  12    14   Calcium Ionized 1.15 - 1.40 mmol/L  1.16     Magnesium 1.7 - 2.4 mg/dL     2.4  Alkaline Phosphatase 38 - 126 U/L    122 132 (H)  Albumin 3.5 - 5.0 g/dL    2.4 (L) 2.6  (L)  AST 15 - 41 U/L    52 (H) 56 (H)  ALT 0 - 44 U/L    63 (H) 66 (H)  Total Protein 6.5 - 8.1 g/dL    6.1 (L) 6.8  Bilirubin, Direct 0.0 - 0.2 mg/dL    0.3 (H)   Indirect Bilirubin 0.3 - 0.9 mg/dL    0.7   Total Bilirubin 0.3 - 1.2 mg/dL    1.0 0.8  GFR, Estimated >60 mL/min 48 (L)    47 (L)  B Natriuretic Peptide 0.0 - 100.0 pg/mL 2,729.9 (H)      Troponin I (High Sensitivity) <18 ng/L 156 (HH)  143 (HH)    (HH): Data is critically high (H): Data is abnormally high (L): Data is abnormally low  Imaging/tests reviewed and independently interpreted: Echocardiogram 07/18/2021 Telemetry 07/19/2021  Tests ordered: CXR   Cardiac Studies:  Telemetry 07/19/2021: Brief episodes of probable atrial tachycardia  EKG 07/17/2021: Sinus rhythm 92 bpm Frequent PVC  Echocardiogram 07/18/2021:  1. Left ventricular ejection fraction, by estimation, is 25 to 30%. The  left ventricle has severely decreased  function. The left ventricle  demonstrates global hypokinesis. Left ventricular diastolic parameters are  consistent with Grade III diastolic dysfunction (restrictive).   2. Right ventricular systolic function is normal. The right ventricular  size is normal.   3. Left atrial size was severely dilated.   4. The mitral valve is normal in structure. Trivial mitral valve  regurgitation. No evidence of mitral stenosis.   5. The aortic valve is normal in structure. Aortic valve regurgitation is  not visualized. No aortic stenosis is present.   6. The inferior vena cava is normal in size with greater than 50%  respiratory variability, suggesting right atrial pressure of 3 mmHg.  Personally reviewed. LV appears mildly dilated.   Assessment & Recommendations:  71 y.o. African American female  with hypertension, COPD, bipolar disorder, recent cocaine abuse, admitted with acute hypoxic respiratory failure, congestive heart failure, cocaine positive.    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 EF 25-30%, as well as possible COPD exacerbation-managed by the primary team. She remains in acute decompensation with oxygen saturation decreasing to high 80s on anything <6 L O2. Differentials include cocaine or ?alcohol induced nonischemic cardiomyopathy or less likely, ischemic cardiomyopathy I/O probably not accurate. With presence of cirrhosis noted on liver US, will start on PO spironolactone 12.5 mg daily Continue lasix 40 mg IV bid May add Entresto before discharge. Unable to use [place] due to liver dysfunction, unable to use beta blockers due to recent cocaine use. Counseled the patient regarding need for strict abstinence from cocaine on 1/30.    She remains in acute decompensation heart failure. Will consider ischemia evaluation either prior to discharge or outpatient, subject to improvement in her condition.  Atrial tachycardia: Brief episodes. No Afib/ Aflutter Unlikely to be the cause of her cardiomyopathy.   Abnormal LFT: Cirrhosis noted on liver US. She denies significant alcohol use.  Differentials include congestive hepatopathy versus primary liver pathology. Recommend spironolactone and Lasix as above, will increase the dose of spironolactone as tolerated.  Discussed interpretation and management recommendations with the primary team   Nigel Mormon, MD Pager: [phone] Office: [phone]

## 2021-07-19 NOTE — Progress Notes (Signed)
RT took pt off of BiPAP at this time and placed on 8L salter HFNC pt has sat of 95% and RR of 22. Pt stated shes not having trouble breathing at this time. RT informed pt to push Nurse call bell button if she feels that she begins to have trouble breathing. RT to continue to monitor as needed

## 2021-07-19 NOTE — Progress Notes (Signed)
Progress Note   Patient: Valerie Rodgers [id number] DOB: [birth year] DOA: 07/17/2021     1 DOS: the patient was seen and examined on 07/19/2021       Brief hospital course: Valerie Rodgers is a 71 y.o. F with hx HTN, COPD, bipolar, polysubstance abuse who presented with few days progressive shortness of breath, and acutely much worse after snorting cocaine.  In the ER she was hypoxic to 88%, very tachypneic and in respiratory distress, stabilized with BiPAP.  Troponins 150s and stable, BNP elevated, chest imaging ruled out PE but showed multifocal bilateral opacities.  Admitted and started on Lasix for congestive heart failure.       Assessment and Plan: * Acute on chronic systolic and diastolic CHF In the setting of cocaine use. Echocardiogram shows EF 25 to [id number], grade 3 diastolic dysfunction.  - Increase Lasix to 80 twice daily - Start spironolactone - Consult cardiology, appreciate recommendations - Cardiology plan Entresto during this hospital stay - Beta-blockers relatively contraindicated given cocaine use    Acute respiratory failure with hypoxia ([place])- (present on admission) Due to CHF.  Possibly COPD contributing.  See above.  Still on 6 L today.  COPD with acute exacerbation Procalcitonin low, history of COPD with significant hypoxia, sputum production, cough - Start steroids - Start scheduled bronchodilators - Narrow antibiotics azithromycin  Hypernatremia Mild.  On diuretics. - Trend Na while diuresing  Chronic kidney disease, stage 3a   Creatinine stable relative to baseline  Nonsustained ventricular tachycardia Brief, asymptomatic - Keep Mag>2, K>4  Essential hypertension-  Blood pressure controlled -Continue Lasix - Start spironolactone - As needed hydralazine -Hold amlodipine, lisinopril, HCTZ for now    Likely cirrhosis Mild transaminitis, ultrasound shows nondilated ducts, morphology of cirrhosis. -Recommend alcohol cessation  Elevated  troponin level not due myocardial infarction  No angina.  Likely this is non-infarction elevated troponin due to CHF  Cocaine abuse ([place])- (present on admission) Recommended cessation        Subjective: Patient still pretty tired, still pretty out of breath.  No active chest pain.  No vomiting.  No confusion.  No fever.  Physical Exam: Vitals:   07/19/21 0517 07/19/21 0539 07/19/21 0831 07/19/21 1143  BP: 94/60 108/67  (!) 148/119  Pulse:    (!) 43  Resp: (!) 23 (!) 23  (!) 22  Temp: 98.2 F (36.8 C)   97.9 F (36.6 C)  TempSrc: Axillary   Oral  SpO2: 92% 95% 90% 97%  Weight: 70.4 kg     Height:       Elderly adult female, sitting up in bed, conversational.  Nasal cannula in place. JVP appears normal, heart rate normal, soft systolic murmur, no lower extremity edema. Respiratory rate slightly elevated, she has rales bilaterally, up to midlung, little bit more.  No wheezing. Abdomen soft without tenderness palpation or guarding, no ascites or distention. Awake and responding to questions, moves upper extremities with normal strength and coordination, speech fluent, face symmetric. Attention normal, affect seems appropriate, judgment and insight seem about at baseline, oriented to person, place, and time  Data Reviewed: Patient's labs and imaging reviewed and notable for echocardiogram with EF 25 to 30%, grade 3 DD CT angiogram with bilateral patchy opacities, no PE. Procalcitonin 0.33 Ultrasound of the abdomen showing cirrhosis but no other abnormal findings Bilateral lower extremity DVT ultrasound negative for DVT UDS positive for cocaine Metabolic panel notable for creatinine stable at 1.2, sodium up to 146, LFTs slightly elevated, no change  Complete blood count notable for slight increase, hemoglobin and platelets normal Discussed with cardiology    Family Communication:   Disposition: Status is: Inpatient  Remains inpatient appropriate because: She will require  ongoing IV Lasix, close electrolyte monitoring, starting steroids and bronchodilators.  This appears to be mostly driven by CHF, there may be COPD component, less likely pneumonia/bacterial  She still is requiring 6 L of oxygen, suspect will need several more days diuresis to return to room air her baseline           Author: Edwin Dada, MD 07/19/2021 12:04 PM  For on call review [email or web address].

## 2021-07-19 NOTE — Progress Notes (Signed)
Patient wearing oxygen set at 7 lpm with Sp02 =93%. Patient has no c/o shortness of breath. Bipap in room on standby, will use if needed.

## 2021-07-19 NOTE — Progress Notes (Signed)
Mobility Specialist Progress Note:   07/19/21 1209  Mobility  Activity Ambulated with assistance in hallway  Level of Assistance Standby assist, set-up cues, supervision of patient - no hands on  Assistive Device Four wheel walker  Distance Ambulated (ft) 240 ft  Activity Response Tolerated well  $Mobility charge 1 Mobility   Pt received in bed willing to participate in mobility. No complaints of pain and asymptomatic. Pt left in bed with call bell in reach and all needs met.   [facility] Public librarian Phone [phone] Secondary Phone [phone]

## 2021-07-20 ENCOUNTER — Other Ambulatory Visit (HOSPITAL_COMMUNITY): Payer: Self-pay

## 2021-07-20 DIAGNOSIS — F1410 Cocaine abuse, uncomplicated: Secondary | ICD-10-CM | POA: Diagnosis not present

## 2021-07-20 DIAGNOSIS — K7460 Unspecified cirrhosis of liver: Secondary | ICD-10-CM | POA: Diagnosis present

## 2021-07-20 DIAGNOSIS — R778 Other specified abnormalities of plasma proteins: Secondary | ICD-10-CM | POA: Diagnosis not present

## 2021-07-20 DIAGNOSIS — I5020 Unspecified systolic (congestive) heart failure: Secondary | ICD-10-CM

## 2021-07-20 DIAGNOSIS — J9601 Acute respiratory failure with hypoxia: Secondary | ICD-10-CM | POA: Diagnosis not present

## 2021-07-20 DIAGNOSIS — I5041 Acute combined systolic (congestive) and diastolic (congestive) heart failure: Secondary | ICD-10-CM | POA: Diagnosis not present

## 2021-07-20 LAB — BASIC METABOLIC PANEL
Anion gap: 11 (ref 5–15)
BUN: 37 mg/dL — ABNORMAL HIGH (ref 8–23)
CO2: 30 mmol/L (ref 22–32)
Calcium: 8.6 mg/dL — ABNORMAL LOW (ref 8.9–10.3)
Chloride: 100 mmol/L (ref 98–111)
Creatinine, Ser: 1.24 mg/dL — ABNORMAL HIGH (ref 0.44–1.00)
GFR, Estimated: 47 mL/min — ABNORMAL LOW (ref >60)
Glucose, Bld: 127 mg/dL — ABNORMAL HIGH (ref 70–99)
Potassium: 3.1 mmol/L — ABNORMAL LOW (ref 3.5–5.1)
Sodium: 141 mmol/L (ref 135–145)

## 2021-07-20 LAB — CBC
HCT: 40.8 % (ref 36.0–46.0)
Hemoglobin: 13.5 g/dL (ref 12.0–15.0)
MCH: 28 pg (ref 26.0–34.0)
MCHC: 33.1 g/dL (ref 30.0–36.0)
MCV: 84.6 fL (ref 80.0–100.0)
Platelets: 305 10*3/uL (ref 150–400)
RBC: 4.82 MIL/uL (ref 3.87–5.11)
RDW: 14.3 % (ref 11.5–15.5)
WBC: 13.1 10*3/uL — ABNORMAL HIGH (ref 4.0–10.5)
nRBC: 0 % (ref 0.0–0.2)

## 2021-07-20 LAB — HEPATITIS B CORE ANTIBODY, TOTAL: Hep B Core Total Ab: NONREACTIVE

## 2021-07-20 LAB — HEPATITIS C ANTIBODY: HCV Ab: NONREACTIVE

## 2021-07-20 LAB — GLUCOSE, CAPILLARY: Glucose-Capillary: 129 mg/dL — ABNORMAL HIGH (ref 70–99)

## 2021-07-20 LAB — BRAIN NATRIURETIC PEPTIDE: B Natriuretic Peptide: 509 pg/mL — ABNORMAL HIGH (ref 0.0–100.0)

## 2021-07-20 MED ORDER — SACUBITRIL-VALSARTAN 24-26 MG PO TABS
1.0000 | ORAL_TABLET | Freq: Two times a day (BID) | ORAL | Status: DC
  Administered 2021-07-20 – 2021-07-23 (×7): 1 via ORAL
  Filled 2021-07-20 (×7): qty 1

## 2021-07-20 MED ORDER — FUROSEMIDE 40 MG PO TABS
40.0000 mg | ORAL_TABLET | Freq: Every day | ORAL | Status: DC
  Administered 2021-07-21: 40 mg via ORAL
  Filled 2021-07-20: qty 1

## 2021-07-20 MED ORDER — IPRATROPIUM-ALBUTEROL 0.5-2.5 (3) MG/3ML IN SOLN
3.0000 mL | Freq: Two times a day (BID) | RESPIRATORY_TRACT | Status: DC
  Administered 2021-07-20 – 2021-07-23 (×6): 3 mL via RESPIRATORY_TRACT
  Filled 2021-07-20 (×7): qty 3

## 2021-07-20 MED ORDER — POTASSIUM CHLORIDE CRYS ER 20 MEQ PO TBCR
40.0000 meq | EXTENDED_RELEASE_TABLET | Freq: Two times a day (BID) | ORAL | Status: DC
  Administered 2021-07-20 (×2): 40 meq via ORAL
  Filled 2021-07-20 (×2): qty 2

## 2021-07-20 MED ORDER — ORAL CARE MOUTH RINSE
15.0000 mL | Freq: Two times a day (BID) | OROMUCOSAL | Status: DC
  Administered 2021-07-21 – 2021-07-23 (×5): 15 mL via OROMUCOSAL

## 2021-07-20 MED ORDER — IVABRADINE HCL 5 MG PO TABS
5.0000 mg | ORAL_TABLET | Freq: Two times a day (BID) | ORAL | Status: DC
  Administered 2021-07-20 – 2021-07-23 (×6): 5 mg via ORAL
  Filled 2021-07-20 (×7): qty 1

## 2021-07-20 MED ORDER — FUROSEMIDE 10 MG/ML IJ SOLN: 40.0000 mg | Freq: Every day | INTRAMUSCULAR | Status: DC

## 2021-07-20 NOTE — TOC Benefit Eligibility Note (Signed)
Patient Product/process development scientist completed.    The patient is currently admitted and upon discharge could be taking Entresto 24-26 mg.  The current 30 day co-pay is, $0.00.   The patient is insured through [employer] Part D   Roland Earl, CPhT Pharmacy Patient Advocate Specialist [facility] Patient Advocate Team Direct Number: [phone]  Fax: [phone]

## 2021-07-20 NOTE — Plan of Care (Signed)
  Problem: Education: Goal: Ability to demonstrate management of disease process will improve Outcome: Progressing   Problem: Activity: Goal: Capacity to carry out activities will improve Outcome: Progressing   Problem: Education: Goal: Knowledge of General Education information will improve Description: Including pain rating scale, medication(s)/side effects and non-pharmacologic comfort measures Outcome: Progressing   

## 2021-07-20 NOTE — Progress Notes (Signed)
Mobility Specialist Progress Note: ° ° 07/20/21 1533  °Mobility  °Activity Ambulated with assistance in hallway  °Level of Assistance Contact guard assist, steadying assist  °Assistive Device Front wheel walker  °Distance Ambulated (ft) 520 ft  °Activity Response Tolerated well  °$Mobility charge 1 Mobility  ° °Pt received in bed willing to participate in mobility. No complaints of pain and asymptomatic throughout walk. Left in bed with call bell in reach and all needs met.  ° °  °Mobility Specialist °Primary Phone [phone] °Secondary Phone [phone] ° °

## 2021-07-20 NOTE — Plan of Care (Signed)
  Problem: Education: Goal: Ability to demonstrate management of disease process will improve Outcome: Progressing   Problem: Education: Goal: Ability to verbalize understanding of medication therapies will improve Outcome: Progressing   Problem: Clinical Measurements: Goal: Respiratory complications will improve Outcome: Progressing   

## 2021-07-20 NOTE — Assessment & Plan Note (Addendum)
On US-Subtle nodularity of the liver contour suggesting cirrhosis, 3 mm echogenic focus in the gallbladder which may represent a polyp. -Patient admits to occasional EtOH use but not heavy, uses cocaine regularly -Hepatitis B and C serologies are negative-Will send GI referral at discharge -Lasix, Aldactone as noted above

## 2021-07-20 NOTE — Progress Notes (Addendum)
Subjective:  Breathing improved  Only 500 cc UP documented. Reportedly Purewick cathter was placed only yesterday  Off BiPAP this morning. On 5 L O2  Objective:  Vital Signs in the last 24 hours: Temp:  [97.9 F (36.6 C)-98.5 F (36.9 C)] 98.1 F (36.7 C) (02/01 0400) Pulse Rate:  [43-90] 83 (02/01 0400) Resp:  [18-23] 18 (02/01 0400) BP: (101-148)/(61-119) 102/61 (02/01 0400) SpO2:  [90 %-97 %] 95 % (02/01 0400) Weight:  [71.8 kg] 71.8 kg (02/01 0400)  Intake/Output from previous day: 01/31 0701 - 02/01 0700 In: 1326 [P.O.:1326] Out: 500 [Urine:500]  Physical Exam Vitals and nursing note reviewed.  Constitutional:      General: She is not in acute distress. Neck:     Vascular: No JVD.  Cardiovascular:     Rate and Rhythm: Normal rate and regular rhythm.     Heart sounds: Normal heart sounds. No murmur heard. Pulmonary:     Effort: No respiratory distress.     Breath sounds: Examination of the right-lower field reveals rales. Examination of the left-lower field reveals rales. Rales present. No decreased breath sounds, wheezing or rhonchi.  Musculoskeletal:     Right lower leg: No edema.     Left lower leg: No edema.     Lab Results: Reviewed and interpreted: BMP Cr stable  Imaging/tests reviewed and independently interpreted: CXR 07/19/2021  Tests ordered: Stress test   Cardiac Studies:  Telemetry 07/19/2021: Brief episodes of probable atrial tachycardia  EKG 07/17/2021: Sinus rhythm 92 bpm Frequent PVC  Echocardiogram 07/18/2021:  1. Left ventricular ejection fraction, by estimation, is 25 to 30%. The  left ventricle has severely decreased function. The left ventricle  demonstrates global hypokinesis. Left ventricular diastolic parameters are  consistent with Grade III diastolic dysfunction (restrictive).   2. Right ventricular systolic function is normal. The right ventricular  size is normal.   3. Left atrial size was severely dilated.   4. The  mitral valve is normal in structure. Trivial mitral valve  regurgitation. No evidence of mitral stenosis.   5. The aortic valve is normal in structure. Aortic valve regurgitation is  not visualized. No aortic stenosis is present.   6. The inferior vena cava is normal in size with greater than 50%  respiratory variability, suggesting right atrial pressure of 3 mmHg.  Personally reviewed. LV appears mildly dilated.   Assessment & Recommendations:  71 y.o. African American female  with hypertension, COPD, bipolar disorder, recent cocaine abuse, admitted with acute hypoxic respiratory failure, congestive heart failure, cocaine positive.    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 EF 25-30%, as well as possible COPD exacerbation-managed by the primary team. CXR showed improvement (1/31) Acute decompensation has continued to improve, but still requires 5 L supplemental oxygen Differentials include cocaine or ?alcohol induced nonischemic cardiomyopathy or less likely, ischemic cardiomyopathy I/O probably not accurate. Continue spironolactone 12.5 mg daily Reduce IV lasix to 40 mg daily Added Entresto 24-26 mg bid and corlanor 5 mg bid Unable to use [place] due to liver dysfunction, unable to use beta blockers due to recent cocaine use. Counseled the patient regarding need for strict abstinence from cocaine on 1/30.    Recommend stress test for ischemia risk stratification. Ordered today. Since patient has eaten this morning, this will be performed tomorrow morning. Even found to have ischemia, further invasive workup can be performed outpatient, if needed.  I anticipate discharge on 2/2 after  stress test  Atrial tachycardia: Brief episodes. No Afib/ Aflutter Unlikely to be the cause of her cardiomyopathy.   Abnormal LFT: Cirrhosis noted on liver US. She denies significant alcohol use.  Differentials include  congestive hepatopathy versus primary liver pathology. Recommend spironolactone and Lasix as above. Defer workup to primary team  Discussed interpretation and management recommendations with the primary team Dr Genevieve Norlander, MD Pager: [phone] Office: [phone]

## 2021-07-20 NOTE — Progress Notes (Signed)
PROGRESS NOTE    Valerie Rodgers  [id number] DOB: Apr 13, 1951 DOA: 07/17/2021 PCP: Javier Docker, MD  Brief Narrative:71 y.o. F with hx HTN, COPD, bipolar, polysubstance abuse who presented with few days progressive shortness of breath, and acutely much worse after snorting cocaine. In the ER she was hypoxic to 88%, very tachypneic and in respiratory distress, stabilized with BiPAP.  Troponins 150s and stable, BNP elevated, chest imaging ruled out PE but showed multifocal bilateral opacities.  Admitted and started on Lasix for congestive heart failure.    Subjective: -Breathing better, still on 5 L O2  Assessment & Plan:   * Acute congestive heart failure (HCC) Acute systolic and diastolic CHF Echo-EF [id number],, grade 3 diastolic dysfunction -Clinically improving with diuresis -Cardiology following, for stress test today -Could likely be nonischemic -Started on Entresto, Aldactone, Corlanor -Clinically appears euvolemic, could transition to oral diuretics  Acute respiratory failure with hypoxia ([place])- (present on admission) -Had respiratory distress with hypoxic respiratory failure requiring BiPAP on admission  -Secondary to pulmonary edema  -Now improving, still on 5 L O2, wean O2 down today   Cocaine abuse ([place])- (present on admission) Counseling patient on cessation daily Patient reports last use of cocaine was on 1/29  Cirrhosis of liver ([place])- (present on admission) On US-Subtle nodularity of the liver contour suggesting cirrhosis, 3 mm echogenic focus in the gallbladder which may represent a polyp. -Patient admits to some EtOH use but not heavy -Check hep B, C serology-Will send GI referral at discharge -Lasix, Aldactone as noted above  Chronic kidney disease, stage 3a ([place])- (present on admission) Creatinine stable, monitor   Essential hypertension- (present on admission) Stable, heart failure meds as noted above  Acute cardiogenic pulmonary edema  (HCC) Please see assessment and plan above  Elevated troponin level not due myocardial infarction- (present on admission) Likely secondary to demand ischemia, could have CAD as well -Cardiology following, plan for stress test today        DVT prophylaxis: Lovenox Code Status: Full code Family Communication: No family at bedside Disposition Plan:   Consultants:  Cardiology  Procedures:   Antimicrobials:    Objective: Vitals:   07/20/21 0400 07/20/21 0722 07/20/21 0900 07/20/21 1047  BP: 102/61 90/60  (!) 106/53  Pulse: 83 (!) 50  83  Resp: 18 20  19   Temp: 98.1 F (36.7 C) 98.6 F (37 C)  98.3 F (36.8 C)  TempSrc: Oral Oral  Oral  SpO2: 95% 91% (!) 80% 95%  Weight: 72 kg     Height:        Intake/Output Summary (Last 24 hours) at 07/20/2021 1152 Last data filed at 07/20/2021 1053 Gross per 24 hour  Intake 1090 ml  Output 1100 ml  Net -10 ml   Filed Weights   07/18/21 1039 07/19/21 0517 07/20/21 0400  Weight: 72.4 kg 70.4 kg 71.8 kg    Examination:  General exam: Appears calm and comfortable  Respiratory system: Clear to auscultation Cardiovascular system: S1 & S2 heard, RRR.  Abd: nondistended, soft and nontender.Normal bowel sounds heard. Central nervous system: Alert and oriented. No focal neurological deficits. Extremities: no edema Skin: No rashes Psychiatry:  Mood & affect appropriate.     Data Reviewed:   CBC: Recent Labs  Lab 07/17/21 2206 07/17/21 2349 07/19/21 0130 07/20/21 0542  WBC 14.2*  --  16.5* 13.1*  NEUTROABS 12.0*  --  14.1*  --   HGB 13.6 13.9 14.9 13.5  HCT 39.9 41.0 44.7 40.8  MCV 84.4  --  84.3 84.6  PLT 273  --  242 [id number]   Basic Metabolic Panel: Recent Labs  Lab 07/17/21 2206 07/17/21 2349 07/19/21 0130 07/20/21 0542  NA 137 141 146* 141  K 4.2 3.8 3.8 3.1*  CL 101  --  103 100  CO2 24  --  29 30  GLUCOSE 118*  --  101* 127*  BUN 19  --  27* 37*  CREATININE 1.21*  --  1.24* 1.24*  CALCIUM 8.9  --  9.0  8.6*  MG  --   --  2.4  --    GFR: Estimated Creatinine Clearance: 40.4 mL/min (A) (by C-G formula based on SCr of 1.24 mg/dL (H)). Liver Function Tests: Recent Labs  Lab 07/18/21 0116 07/19/21 0130  AST 52* 56*  ALT 63* 66*  ALKPHOS 122 132*  BILITOT 1.0 0.8  PROT 6.1* 6.8  ALBUMIN 2.4* 2.6*   No results for input(s): LIPASE, AMYLASE in the last 168 hours. No results for input(s): AMMONIA in the last 168 hours. Coagulation Profile: No results for input(s): INR, PROTIME in the last 168 hours. Cardiac Enzymes: No results for input(s): CKTOTAL, CKMB, CKMBINDEX, TROPONINI in the last 168 hours. BNP (last 3 results) No results for input(s): PROBNP in the last 8760 hours. HbA1C: No results for input(s): HGBA1C in the last 72 hours. CBG: Recent Labs  Lab 07/20/21 0735  GLUCAP 129*   Lipid Profile: No results for input(s): CHOL, HDL, LDLCALC, TRIG, CHOLHDL, LDLDIRECT in the last 72 hours. Thyroid Function Tests: No results for input(s): TSH, T4TOTAL, FREET4, T3FREE, THYROIDAB in the last 72 hours. Anemia Panel: No results for input(s): VITAMINB12, FOLATE, FERRITIN, TIBC, IRON, RETICCTPCT in the last 72 hours. Urine analysis:    Component Value Date/Time   COLORURINE YELLOW 07/18/2021 0529   APPEARANCEUR CLEAR 07/18/2021 0529   LABSPEC 1.020 07/18/2021 0529   PHURINE 5.5 07/18/2021 0529   GLUCOSEU NEGATIVE 07/18/2021 0529   HGBUR NEGATIVE 07/18/2021 0529   BILIRUBINUR NEGATIVE 07/18/2021 0529   KETONESUR NEGATIVE 07/18/2021 0529   PROTEINUR NEGATIVE 07/18/2021 0529   NITRITE NEGATIVE 07/18/2021 0529   LEUKOCYTESUR NEGATIVE 07/18/2021 0529   Sepsis Labs: @LABRCNTIP (procalcitonin:4,lacticidven:4)  ) Recent Results (from the past 240 hour(s))  Resp Panel by RT-PCR (Flu A&B, Covid) Nasopharyngeal Swab     Status: None   Collection Time: 07/17/21 10:08 PM   Specimen: Nasopharyngeal Swab; Nasopharyngeal(NP) swabs in vial transport medium  Result Value Ref Range Status    SARS Coronavirus 2 by RT PCR NEGATIVE NEGATIVE Final    Comment: (NOTE) SARS-CoV-2 target nucleic acids are NOT DETECTED.  The SARS-CoV-2 RNA is generally detectable in upper respiratory specimens during the acute phase of infection. The lowest concentration of SARS-CoV-2 viral copies this assay can detect is 138 copies/mL. A negative result does not preclude SARS-Cov-2 infection and should not be used as the sole basis for treatment or other patient management decisions. A negative result may occur with  improper specimen collection/handling, submission of specimen other than nasopharyngeal swab, presence of viral mutation(s) within the areas targeted by this assay, and inadequate number of viral copies(<138 copies/mL). A negative result must be combined with clinical observations, patient history, and epidemiological information. The expected result is Negative.  Fact Sheet for Patients:  EntrepreneurPulse.com.au  Fact Sheet for Healthcare Providers:  IncredibleEmployment.be  This test is no t yet approved or cleared by the [place] FDA and  has been authorized for detection and/or diagnosis of SARS-CoV-2  by FDA under an Emergency Use Authorization (EUA). This EUA will remain  in effect (meaning this test can be used) for the duration of the COVID-19 declaration under Section 564(b)(1) of the Act, 21 U.S.C.section 360bbb-3(b)(1), unless the authorization is terminated  or revoked sooner.       Influenza A by PCR NEGATIVE NEGATIVE Final   Influenza B by PCR NEGATIVE NEGATIVE Final    Comment: (NOTE) The Xpert Xpress SARS-CoV-2/FLU/RSV plus assay is intended as an aid in the diagnosis of influenza from Nasopharyngeal swab specimens and should not be used as a sole basis for treatment. Nasal washings and aspirates are unacceptable for Xpert Xpress SARS-CoV-2/FLU/RSV testing.  Fact Sheet for  Patients: BloggerCourse.com  Fact Sheet for Healthcare Providers: SeriousBroker.it  This test is not yet approved or cleared by the [place] FDA and has been authorized for detection and/or diagnosis of SARS-CoV-2 by FDA under an Emergency Use Authorization (EUA). This EUA will remain in effect (meaning this test can be used) for the duration of the COVID-19 declaration under Section 564(b)(1) of the Act, 21 U.S.C. section 360bbb-3(b)(1), unless the authorization is terminated or revoked.  Performed at [facility], [address] [place], [place] [address]   Culture, blood (routine x 2)     Status: None (Preliminary result)   Collection Time: 07/18/21  3:42 AM   Specimen: BLOOD  Result Value Ref Range Status   Specimen Description BLOOD SITE NOT SPECIFIED  Final   Special Requests   Final    BOTTLES DRAWN AEROBIC AND ANAEROBIC Blood Culture adequate volume   Culture   Final    NO GROWTH 1 DAY Performed at [facility], [address], [place], [place] [address]    Report Status PENDING  Incomplete  Culture, blood (routine x 2)     Status: None (Preliminary result)   Collection Time: 07/18/21  5:03 AM   Specimen: BLOOD RIGHT HAND  Result Value Ref Range Status   Specimen Description BLOOD RIGHT HAND  Final   Special Requests   Final    BOTTLES DRAWN AEROBIC AND ANAEROBIC Blood Culture adequate volume   Culture   Final    NO GROWTH 1 DAY Performed at [facility], [address], [place], [place] [address]    Report Status PENDING  Incomplete     Radiology Studies: DG Chest Port 1 View  Result Date: 07/19/2021 CLINICAL DATA:  Pulmonary edema EXAM: PORTABLE CHEST 1 VIEW COMPARISON:  07/17/2021 FINDINGS: Transverse diameter of heart is increased. Central pulmonary vessels are less prominent. There is significant interval decrease in interstitial and alveolar markings in the both parahilar regions and  lower lung fields. There are residual patchy interstitial infiltrates in the left parahilar region and right lower lung fields. Lateral CP angles are clear. There is no pneumothorax. IMPRESSION: Cardiomegaly. There is interval decrease in pulmonary vascular congestion. There is partial clearing of pulmonary edema. Residual increased markings in the left mid and right lower lung fields suggest resolving asymmetric pulmonary edema or underlying pneumonia. Electronically Signed   By: Ernie Avena M.D.   On: 07/19/2021 13:18   ECHOCARDIOGRAM COMPLETE  Result Date: 07/18/2021    ECHOCARDIOGRAM REPORT   Patient Name:   CHEE SULEK Date of Exam: 07/18/2021 Medical Rec #:  [id number]       Height:       64.0 in Accession #:    [id number]      Weight:  167.0 lb Date of Birth:  05-02-1951        BSA:          1.812 m Patient Age:    68 years        BP:           98/71 mmHg Patient Gender: F               HR:           102 bpm. Exam Location:  Inpatient Procedure: 2D Echo, Cardiac Doppler and Color Doppler Indications:    I50.21 ACUTE CHF  History:        Patient has prior history of Echocardiogram examinations, most                 recent 05/31/2021.  Sonographer:    Beryle Beams Referring Phys: [id number] Vernelle Emerald  Sonographer Comments: Echo performed with patient supine and on artificial respirator. NO SSN IMPRESSIONS  1. Left ventricular ejection fraction, by estimation, is 25 to 30%. The left ventricle has severely decreased function. The left ventricle demonstrates global hypokinesis. Left ventricular diastolic parameters are consistent with Grade III diastolic dysfunction (restrictive).  2. Right ventricular systolic function is normal. The right ventricular size is normal.  3. Left atrial size was severely dilated.  4. The mitral valve is normal in structure. Trivial mitral valve regurgitation. No evidence of mitral stenosis.  5. The aortic valve is normal in structure. Aortic valve  regurgitation is not visualized. No aortic stenosis is present.  6. The inferior vena cava is normal in size with greater than 50% respiratory variability, suggesting right atrial pressure of 3 mmHg. FINDINGS  Left Ventricle: Left ventricular ejection fraction, by estimation, is 25 to 30%. The left ventricle has severely decreased function. The left ventricle demonstrates global hypokinesis. The left ventricular internal cavity size was normal in size. There is no left ventricular hypertrophy. Left ventricular diastolic parameters are consistent with Grade III diastolic dysfunction (restrictive). Right Ventricle: The right ventricular size is normal. No increase in right ventricular wall thickness. Right ventricular systolic function is normal. Left Atrium: Left atrial size was severely dilated. Right Atrium: Right atrial size was normal in size. Pericardium: There is no evidence of pericardial effusion. Mitral Valve: The mitral valve is normal in structure. Trivial mitral valve regurgitation. No evidence of mitral valve stenosis. MV peak gradient, 47.6 mmHg. The mean mitral valve gradient is 34.0 mmHg. Tricuspid Valve: The tricuspid valve is normal in structure. Tricuspid valve regurgitation is mild . No evidence of tricuspid stenosis. Aortic Valve: The aortic valve is normal in structure. Aortic valve regurgitation is not visualized. Aortic regurgitation PHT measures 269 msec. No aortic stenosis is present. Aortic valve mean gradient measures 4.0 mmHg. Aortic valve peak gradient measures 7.4 mmHg. Aortic valve area, by VTI measures 1.63 cm. Pulmonic Valve: The pulmonic valve was normal in structure. Pulmonic valve regurgitation is not visualized. No evidence of pulmonic stenosis. Aorta: The aortic root is normal in size and structure. Venous: The inferior vena cava is normal in size with greater than 50% respiratory variability, suggesting right atrial pressure of 3 mmHg. IAS/Shunts: No atrial level shunt  detected by color flow Doppler.  LEFT VENTRICLE PLAX 2D LVIDd:         5.50 cm      Diastology LVIDs:         4.80 cm      LV e' medial:    5.00 cm/s LV PW:  0.90 cm      LV E/e' medial:  23.6 LV IVS:        0.70 cm      LV e' lateral:   5.22 cm/s LVOT diam:     2.10 cm      LV E/e' lateral: 22.6 LV SV:         37 LV SV Index:   20 LVOT Area:     3.46 cm  LV Volumes (MOD) LV vol d, MOD A2C: 74.2 ml LV vol d, MOD A4C: 107.0 ml LV vol s, MOD A2C: 80.0 ml LV vol s, MOD A4C: 60.0 ml LV SV MOD A2C:     -5.8 ml LV SV MOD A4C:     107.0 ml LV SV MOD BP:      22.5 ml RIGHT VENTRICLE            IVC RV S prime:     9.46 cm/s  IVC diam: 2.30 cm RVOT diam:      2.30 cm TAPSE (M-mode): 2.0 cm LEFT ATRIUM              Index        RIGHT ATRIUM           Index LA diam:        3.10 cm  1.71 cm/m   RA Area:     14.60 cm LA Vol (A2C):   88.6 ml  48.90 ml/m  RA Volume:   36.60 ml  20.20 ml/m LA Vol (A4C):   108.0 ml 59.60 ml/m LA Biplane Vol: 99.8 ml  55.08 ml/m  AORTIC VALVE                    PULMONIC VALVE AV Area (Vmax):    1.67 cm     PV Vmax:       0.52 m/s AV Area (Vmean):   1.65 cm     PV Vmean:      33.500 cm/s AV Area (VTI):     1.63 cm     PV VTI:        0.087 m AV Vmax:           136.00 cm/s  PV Peak grad:  1.1 mmHg AV Vmean:          87.500 cm/s  PV Mean grad:  1.0 mmHg AV VTI:            0.228 m AV Peak Grad:      7.4 mmHg AV Mean Grad:      4.0 mmHg LVOT Vmax:         65.50 cm/s LVOT Vmean:        41.600 cm/s LVOT VTI:          0.107 m LVOT/AV VTI ratio: 0.47 AI PHT:            269 msec  AORTA Ao Root diam: 2.60 cm Ao Asc diam:  3.10 cm MITRAL VALVE MV Area (PHT): 5.54 cm     SHUNTS MV Area VTI:   0.33 cm     Systemic VTI:  0.11 m MV Peak grad:  47.6 mmHg    Systemic Diam: 2.10 cm MV Mean grad:  34.0 mmHg    Pulmonic Diam: 2.30 cm MV Vmax:       3.45 m/s MV Vmean:      276.0 cm/s MV Decel Time: 137 msec MV E velocity: 118.00 cm/s MV A velocity: 117.30 cm/s  MV E/A ratio:  1.01 Kardie Tobb DO  Electronically signed by Berniece Salines DO Signature Date/Time: 07/18/2021/3:44:30 PM    Final    VAS [place] LOWER EXTREMITY VENOUS (DVT)  Result Date: 07/18/2021  Lower Venous DVT Study Patient Name:  ELLIEANNA RICHERSON  Date of Exam:   07/18/2021 Medical Rec #: [id number]        Accession #:    [id number] Date of Birth: 02-13-1951         Patient Gender: F Patient Age:   47 years Exam Location[facility]  [facility] Procedure:      VAS [place] LOWER EXTREMITY VENOUS (DVT) Referring Phys: Inda Merlin --------------------------------------------------------------------------------  Indications: SOB.  Risk Factors: Cocaine and ETOH abuse. Limitations: BiPap, constant movement. Comparison Study: No prior study on file Performing Technologist: Sharion Dove RVS  Examination Guidelines: A complete evaluation includes B-mode imaging, spectral Doppler, color Doppler, and power Doppler as needed of all accessible portions of each vessel. Bilateral testing is considered an integral part of a complete examination. Limited examinations for reoccurring indications may be performed as noted. The reflux portion of the exam is performed with the patient in reverse Trendelenburg.  +---------+---------------+---------+-----------+----------+--------------+  RIGHT     Compressibility Phasicity Spontaneity Properties Thrombus Aging  +---------+---------------+---------+-----------+----------+--------------+  CFV       Full            Yes       Yes                                    +---------+---------------+---------+-----------+----------+--------------+  SFJ       Full                                                             +---------+---------------+---------+-----------+----------+--------------+  FV Prox   Full                                                             +---------+---------------+---------+-----------+----------+--------------+  FV Mid    Full                                                              +---------+---------------+---------+-----------+----------+--------------+  FV Distal Full                                                             +---------+---------------+---------+-----------+----------+--------------+  PFV       Full                                                             +---------+---------------+---------+-----------+----------+--------------+  POP       Full            Yes       Yes                                    +---------+---------------+---------+-----------+----------+--------------+  PTV       Full                                                             +---------+---------------+---------+-----------+----------+--------------+  PERO      Full                                                             +---------+---------------+---------+-----------+----------+--------------+   +---------+---------------+---------+-----------+----------+--------------+  LEFT      Compressibility Phasicity Spontaneity Properties Thrombus Aging  +---------+---------------+---------+-----------+----------+--------------+  CFV       Full            Yes       Yes                                    +---------+---------------+---------+-----------+----------+--------------+  SFJ       Full                                                             +---------+---------------+---------+-----------+----------+--------------+  FV Prox   Full                                                             +---------+---------------+---------+-----------+----------+--------------+  FV Mid    Full                                                             +---------+---------------+---------+-----------+----------+--------------+  FV Distal Full                                                             +---------+---------------+---------+-----------+----------+--------------+  PFV       Full                                                              +---------+---------------+---------+-----------+----------+--------------+  POP       Full            Yes       Yes                                    +---------+---------------+---------+-----------+----------+--------------+  PTV       Full                                                             +---------+---------------+---------+-----------+----------+--------------+  PERO      Full                                                             +---------+---------------+---------+-----------+----------+--------------+     Summary: BILATERAL: -No evidence of popliteal cyst, bilaterally. RIGHT: - There is no evidence of deep vein thrombosis in the lower extremity.  LEFT: - There is no evidence of deep vein thrombosis in the lower extremity.  *See table(s) above for measurements and observations. Electronically signed by Monica Martinez MD on 07/18/2021 at 1:52:16 PM.    Final      Scheduled Meds:  azithromycin  250 mg Oral Daily   enoxaparin (LOVENOX) injection  40 mg Subcutaneous Daily   fluticasone furoate-vilanterol  1 puff Inhalation Daily   [START ON 07/21/2021] furosemide  40 mg Oral Daily   ipratropium-albuterol  3 mL Nebulization Q6H   ivabradine  5 mg Oral BID WC   methylPREDNISolone (SOLU-MEDROL) injection  80 mg Intravenous Daily   potassium chloride  40 mEq Oral BID   sacubitril-valsartan  1 tablet Oral BID   spironolactone  12.5 mg Oral Daily   Continuous Infusions:   LOS: 2 days    Time spent: 9min    Domenic Polite, MD [facility]   07/20/2021, 11:52 AM

## 2021-07-21 ENCOUNTER — Inpatient Hospital Stay (HOSPITAL_COMMUNITY): Payer: Medicare Other

## 2021-07-21 DIAGNOSIS — I5020 Unspecified systolic (congestive) heart failure: Secondary | ICD-10-CM

## 2021-07-21 DIAGNOSIS — I5041 Acute combined systolic (congestive) and diastolic (congestive) heart failure: Secondary | ICD-10-CM | POA: Diagnosis not present

## 2021-07-21 LAB — CBC
HCT: 45.9 % (ref 36.0–46.0)
Hemoglobin: 14.7 g/dL (ref 12.0–15.0)
MCH: 27.2 pg (ref 26.0–34.0)
MCHC: 32 g/dL (ref 30.0–36.0)
MCV: 85 fL (ref 80.0–100.0)
Platelets: 366 10*3/uL (ref 150–400)
RBC: 5.4 MIL/uL — ABNORMAL HIGH (ref 3.87–5.11)
RDW: 14.4 % (ref 11.5–15.5)
WBC: 10.8 10*3/uL — ABNORMAL HIGH (ref 4.0–10.5)
nRBC: 0 % (ref 0.0–0.2)

## 2021-07-21 LAB — BASIC METABOLIC PANEL
Anion gap: 12 (ref 5–15)
BUN: 30 mg/dL — ABNORMAL HIGH (ref 8–23)
CO2: 28 mmol/L (ref 22–32)
Calcium: 8.7 mg/dL — ABNORMAL LOW (ref 8.9–10.3)
Chloride: 101 mmol/L (ref 98–111)
Creatinine, Ser: 1.28 mg/dL — ABNORMAL HIGH (ref 0.44–1.00)
GFR, Estimated: 45 mL/min — ABNORMAL LOW (ref >60)
Glucose, Bld: 114 mg/dL — ABNORMAL HIGH (ref 70–99)
Potassium: 4.7 mmol/L (ref 3.5–5.1)
Sodium: 141 mmol/L (ref 135–145)

## 2021-07-21 MED ORDER — TECHNETIUM TC 99M TETROFOSMIN IV KIT
32.0000 | PACK | Freq: Once | INTRAVENOUS | Status: AC | PRN
  Administered 2021-07-21: 32 via INTRAVENOUS

## 2021-07-21 MED ORDER — METHYLPREDNISOLONE SODIUM SUCC 40 MG IJ SOLR
40.0000 mg | Freq: Every day | INTRAMUSCULAR | Status: DC
  Administered 2021-07-21 – 2021-07-22 (×2): 40 mg via INTRAVENOUS
  Filled 2021-07-21 (×2): qty 1

## 2021-07-21 MED ORDER — TECHNETIUM TC 99M TETROFOSMIN IV KIT
10.7000 | PACK | Freq: Once | INTRAVENOUS | Status: AC | PRN
  Administered 2021-07-21: 10.7 via INTRAVENOUS

## 2021-07-21 MED ORDER — REGADENOSON 0.4 MG/5ML IV SOLN
INTRAVENOUS | Status: AC
  Filled 2021-07-21: qty 5

## 2021-07-21 MED ORDER — REGADENOSON 0.4 MG/5ML IV SOLN
0.4000 mg | Freq: Once | INTRAVENOUS | Status: AC
  Administered 2021-07-21: 0.4 mg via INTRAVENOUS

## 2021-07-21 NOTE — Progress Notes (Signed)
Valerie Rodgers went downstairs for a procedure and so that is why her medication was given later than 10 am.

## 2021-07-21 NOTE — TOC Progression Note (Signed)
Transition of Care [facility]) - Progression Note    Patient Details  Name: Valerie Rodgers MRN: [id number] Date of Birth: Sep 10, 1950  Transition of Care [facility]) CM/SW Contact  Leone [place], RN Phone Number: 07/21/2021, 12:25 PM  Clinical Narrative:    Patient for stress test today, conts on 5 liters [place], will try to wean off oxygen, may need home oxygen at discahrge, plan for dc tomorrow. Will send meds to [facility].         Expected Discharge Plan and Services                                                 Social Determinants of Health (SDOH) Interventions    Readmission Risk Interventions No flowsheet data found.

## 2021-07-21 NOTE — Progress Notes (Signed)
Mobility Specialist Progress Note:   07/21/21 1442  Mobility  Activity Ambulated with assistance in hallway  Level of Assistance Standby assist, set-up cues, supervision of patient - no hands on  Assistive Device Front wheel walker  Distance Ambulated (ft) 520 ft  Activity Response Tolerated well  $Mobility charge 1 Mobility   Pt received in bed willing to participate in mobility. No complaints of pain and asymptomatic. Pt left in bed with call bell in reach and all needs met.   [facility] Public librarian Phone [phone] Secondary Phone [phone]

## 2021-07-21 NOTE — Progress Notes (Signed)
PROGRESS NOTE    COLA GRAINER  [id number] DOB: 1951/01/26 DOA: 07/17/2021 PCP: Javier Docker, MD  Brief Narrative:71 y.o. F with hx HTN, COPD, bipolar, polysubstance abuse who presented with few days progressive shortness of breath, and acutely much worse after snorting cocaine. In the ER she was hypoxic to 88%, very tachypneic and in respiratory distress, stabilized with BiPAP.  Troponins 150s, BNP elevated, chest imaging ruled out PE but showed multifocal bilateral opacities.  Admitted and started on Lasix for congestive heart failure.  -Echo noted EF of 25-30% with grade 3 diastolic dysfunction, improving with diuresis  Subjective: -Feels better, breathing improving overall, still on 5 L O2 this morning  Assessment & Plan:   * Acute congestive heart failure (HCC) Acute systolic and diastolic CHF Echo-EF [id number],, grade 3 diastolic dysfunction -Clinically improving with diuresis, appears euvolemic, transitioned to oral Lasix -Cardiology following -Plan for stress test today -Started on Entresto, Aldactone, Corlanor -Kidney function is stable -Attempt to wean O2, discharge planning  Acute respiratory failure with hypoxia ([place])- (present on admission) -Had respiratory distress with hypoxic respiratory failure requiring BiPAP on admission  -Secondary to pulmonary edema  -Also being treated for COPD exacerbation, cut down steroids, continue azithromycin, nebs -Wean down O2, increase activity   Cocaine abuse ([place])- (present on admission) Counseling patient on cessation daily Patient reports last use of cocaine was on 1/29  Cirrhosis of liver ([place])- (present on admission) On US-Subtle nodularity of the liver contour suggesting cirrhosis, 3 mm echogenic focus in the gallbladder which may represent a polyp. -Patient admits to occasional EtOH use but not heavy, uses cocaine regularly -Hepatitis B and C serologies are negative-Will send GI referral at discharge -Lasix,  Aldactone as noted above  Chronic kidney disease, stage 3a ([place])- (present on admission) Creatinine stable, monitor   Essential hypertension- (present on admission) Stable, heart failure meds as noted above  Elevated troponin level not due myocardial infarction- (present on admission) Likely secondary to demand ischemia, could have CAD as well -Cardiology following, plan for stress test today   DVT prophylaxis: Lovenox Code Status: Full code Family Communication: No family at bedside Disposition Plan:   Consultants:  Cardiology  Procedures:   Antimicrobials:    Objective: Vitals:   07/21/21 0731 07/21/21 0738 07/21/21 0748 07/21/21 0948  BP: 92/76  106/76 106/79  Pulse: 63  73 79  Resp: 17  18   Temp: 98.1 F (36.7 C)  98 F (36.7 C)   TempSrc: Oral  Oral   SpO2: 94% 92% 95%   Weight:      Height:        Intake/Output Summary (Last 24 hours) at 07/21/2021 1005 Last data filed at 07/21/2021 [id number] Gross per 24 hour  Intake 670 ml  Output 1025 ml  Net -355 ml   Filed Weights   07/19/21 0517 07/20/21 0400 07/21/21 [id number]  Weight: 70.4 kg 71.8 kg 71.4 kg    Examination:  General exam: Pleasant female sitting up in bed, AAO x3, chronically ill-appearing CVS: S1-S2, regular rate rhythm Lungs: Clear bilaterally Abdomen: Soft, nontender, nondistended, bowel sounds present Extremities: No edema Skin: No rashes Psychiatry:  Mood & affect appropriate.     Data Reviewed:   CBC: Recent Labs  Lab 07/17/21 2206 07/17/21 2349 07/19/21 0130 07/20/21 0542 07/21/21 0227  WBC 14.2*  --  16.5* 13.1* 10.8*  NEUTROABS 12.0*  --  14.1*  --   --   HGB 13.6 13.9 14.9 13.5 14.7  HCT  39.9 41.0 44.7 40.8 45.9  MCV 84.4  --  84.3 84.6 85.0  PLT 273  --  242 305 [id number]   Basic Metabolic Panel: Recent Labs  Lab 07/17/21 2206 07/17/21 2349 07/19/21 0130 07/20/21 0542 07/21/21 0227  NA 137 141 146* 141 141  K 4.2 3.8 3.8 3.1* 4.7  CL 101  --  103 100 101  CO2 24  --   29 30 28   GLUCOSE 118*  --  101* 127* 114*  BUN 19  --  27* 37* 30*  CREATININE 1.21*  --  1.24* 1.24* 1.28*  CALCIUM 8.9  --  9.0 8.6* 8.7*  MG  --   --  2.4  --   --    GFR: Estimated Creatinine Clearance: 39.1 mL/min (A) (by C-G formula based on SCr of 1.28 mg/dL (H)). Liver Function Tests: Recent Labs  Lab 07/18/21 0116 07/19/21 0130  AST 52* 56*  ALT 63* 66*  ALKPHOS 122 132*  BILITOT 1.0 0.8  PROT 6.1* 6.8  ALBUMIN 2.4* 2.6*   No results for input(s): LIPASE, AMYLASE in the last 168 hours. No results for input(s): AMMONIA in the last 168 hours. Coagulation Profile: No results for input(s): INR, PROTIME in the last 168 hours. Cardiac Enzymes: No results for input(s): CKTOTAL, CKMB, CKMBINDEX, TROPONINI in the last 168 hours. BNP (last 3 results) No results for input(s): PROBNP in the last 8760 hours. HbA1C: No results for input(s): HGBA1C in the last 72 hours. CBG: Recent Labs  Lab 07/20/21 0735  GLUCAP 129*   Lipid Profile: No results for input(s): CHOL, HDL, LDLCALC, TRIG, CHOLHDL, LDLDIRECT in the last 72 hours. Thyroid Function Tests: No results for input(s): TSH, T4TOTAL, FREET4, T3FREE, THYROIDAB in the last 72 hours. Anemia Panel: No results for input(s): VITAMINB12, FOLATE, FERRITIN, TIBC, IRON, RETICCTPCT in the last 72 hours. Urine analysis:    Component Value Date/Time   COLORURINE YELLOW 07/18/2021 0529   APPEARANCEUR CLEAR 07/18/2021 0529   LABSPEC 1.020 07/18/2021 0529   PHURINE 5.5 07/18/2021 0529   GLUCOSEU NEGATIVE 07/18/2021 0529   HGBUR NEGATIVE 07/18/2021 0529   BILIRUBINUR NEGATIVE 07/18/2021 0529   KETONESUR NEGATIVE 07/18/2021 0529   PROTEINUR NEGATIVE 07/18/2021 0529   NITRITE NEGATIVE 07/18/2021 0529   LEUKOCYTESUR NEGATIVE 07/18/2021 0529   Sepsis Labs: @LABRCNTIP (procalcitonin:4,lacticidven:4)  ) Recent Results (from the past 240 hour(s))  Resp Panel by RT-PCR (Flu A&B, Covid) Nasopharyngeal Swab     Status: None    Collection Time: 07/17/21 10:08 PM   Specimen: Nasopharyngeal Swab; Nasopharyngeal(NP) swabs in vial transport medium  Result Value Ref Range Status   SARS Coronavirus 2 by RT PCR NEGATIVE NEGATIVE Final    Comment: (NOTE) SARS-CoV-2 target nucleic acids are NOT DETECTED.  The SARS-CoV-2 RNA is generally detectable in upper respiratory specimens during the acute phase of infection. The lowest concentration of SARS-CoV-2 viral copies this assay can detect is 138 copies/mL. A negative result does not preclude SARS-Cov-2 infection and should not be used as the sole basis for treatment or other patient management decisions. A negative result may occur with  improper specimen collection/handling, submission of specimen other than nasopharyngeal swab, presence of viral mutation(s) within the areas targeted by this assay, and inadequate number of viral copies(<138 copies/mL). A negative result must be combined with clinical observations, patient history, and epidemiological information. The expected result is Negative.  Fact Sheet for Patients:  EntrepreneurPulse.com.au  Fact Sheet for Healthcare Providers:  IncredibleEmployment.be  This test is no  t yet approved or cleared by the [place] and  has been authorized for detection and/or diagnosis of SARS-CoV-2 by FDA under an Emergency Use Authorization (EUA). This EUA will remain  in effect (meaning this test can be used) for the duration of the COVID-19 declaration under Section 564(b)(1) of the Act, 21 U.S.C.section 360bbb-3(b)(1), unless the authorization is terminated  or revoked sooner.       Influenza A by PCR NEGATIVE NEGATIVE Final   Influenza B by PCR NEGATIVE NEGATIVE Final    Comment: (NOTE) The Xpert Xpress SARS-CoV-2/FLU/RSV plus assay is intended as an aid in the diagnosis of influenza from Nasopharyngeal swab specimens and should not be used as a sole basis for treatment.  Nasal washings and aspirates are unacceptable for Xpert Xpress SARS-CoV-2/FLU/RSV testing.  Fact Sheet for Patients: EntrepreneurPulse.com.au  Fact Sheet for Healthcare Providers: IncredibleEmployment.be  This test is not yet approved or cleared by the [place] FDA and has been authorized for detection and/or diagnosis of SARS-CoV-2 by FDA under an Emergency Use Authorization (EUA). This EUA will remain in effect (meaning this test can be used) for the duration of the COVID-19 declaration under Section 564(b)(1) of the Act, 21 U.S.C. section 360bbb-3(b)(1), unless the authorization is terminated or revoked.  Performed at [facility], [place] [address], [place], [place] [address]   Culture, blood (routine x 2)     Status: None (Preliminary result)   Collection Time: 07/18/21  3:42 AM   Specimen: BLOOD  Result Value Ref Range Status   Specimen Description BLOOD SITE NOT SPECIFIED  Final   Special Requests   Final    BOTTLES DRAWN AEROBIC AND ANAEROBIC Blood Culture adequate volume   Culture   Final    NO GROWTH 2 DAYS Performed at [facility], [address] [place], [place] [address]    Report Status PENDING  Incomplete  Culture, blood (routine x 2)     Status: None (Preliminary result)   Collection Time: 07/18/21  5:03 AM   Specimen: BLOOD RIGHT HAND  Result Value Ref Range Status   Specimen Description BLOOD RIGHT HAND  Final   Special Requests   Final    BOTTLES DRAWN AEROBIC AND ANAEROBIC Blood Culture adequate volume   Culture   Final    NO GROWTH 2 DAYS Performed at [facility], [place] [address] [place], [place] [address]    Report Status PENDING  Incomplete     Radiology Studies: DG Chest Port 1 View  Result Date: 07/19/2021 CLINICAL DATA:  Pulmonary edema EXAM: PORTABLE CHEST 1 VIEW COMPARISON:  07/17/2021 FINDINGS: Transverse diameter of heart is increased. Central pulmonary vessels are less prominent.  There is significant interval decrease in interstitial and alveolar markings in the both parahilar regions and lower lung fields. There are residual patchy interstitial infiltrates in the left parahilar region and right lower lung fields. Lateral CP angles are clear. There is no pneumothorax. IMPRESSION: Cardiomegaly. There is interval decrease in pulmonary vascular congestion. There is partial clearing of pulmonary edema. Residual increased markings in the left mid and right lower lung fields suggest resolving asymmetric pulmonary edema or underlying pneumonia. Electronically Signed   By: Elmer Picker M.D.   On: 07/19/2021 13:18     Scheduled Meds:  azithromycin  250 mg Oral Daily   enoxaparin (LOVENOX) injection  40 mg Subcutaneous Daily   fluticasone furoate-vilanterol  1 puff Inhalation Daily   furosemide  40 mg Oral  Daily   ipratropium-albuterol  3 mL Nebulization BID   ivabradine  5 mg Oral BID WC   mouth rinse  15 mL Mouth Rinse BID   methylPREDNISolone (SOLU-MEDROL) injection  40 mg Intravenous Daily   regadenoson       sacubitril-valsartan  1 tablet Oral BID   spironolactone  12.5 mg Oral Daily   Continuous Infusions:   LOS: 3 days    Time spent: 78min    Domenic Polite, MD [facility]   07/21/2021, 10:05 AM

## 2021-07-21 NOTE — Plan of Care (Signed)
°  Problem: Education: °Goal: Ability to demonstrate management of disease process will improve °Outcome: Progressing °  °Problem: Education: °Goal: Ability to verbalize understanding of medication therapies will improve °Outcome: Progressing °  °Problem: Activity: °Goal: Capacity to carry out activities will improve °Outcome: Progressing °  °

## 2021-07-21 NOTE — Progress Notes (Addendum)
Heart Failure Nurse Navigator Progress Note  Admitted 07/17/21 with progressive shortness of breath which was acutely worse after snorting cocaine. Echo revealed EF 25-30%, G3DD (new). Pt being followed by [facility], stress test planned for today   H&V TOC CSW consulted for SDOH screening  Would benefit from appt with H&V TOC clinic post hospital, however pt sch for post hosp f/u with [facility] on 07/28/21  Navigator available for educational resources as needed  Meredith Staggers, RN, BSN, [facility] HF Navigator//Specialty Coordinator

## 2021-07-22 ENCOUNTER — Other Ambulatory Visit (HOSPITAL_COMMUNITY): Payer: Self-pay

## 2021-07-22 DIAGNOSIS — E875 Hyperkalemia: Secondary | ICD-10-CM | POA: Diagnosis not present

## 2021-07-22 LAB — CBC
HCT: 46.7 % — ABNORMAL HIGH (ref 36.0–46.0)
Hemoglobin: 14.9 g/dL (ref 12.0–15.0)
MCH: 27.3 pg (ref 26.0–34.0)
MCHC: 31.9 g/dL (ref 30.0–36.0)
MCV: 85.7 fL (ref 80.0–100.0)
Platelets: 373 10*3/uL (ref 150–400)
RBC: 5.45 MIL/uL — ABNORMAL HIGH (ref 3.87–5.11)
RDW: 14.6 % (ref 11.5–15.5)
WBC: 10.1 10*3/uL (ref 4.0–10.5)
nRBC: 0 % (ref 0.0–0.2)

## 2021-07-22 LAB — BASIC METABOLIC PANEL
Anion gap: 9 (ref 5–15)
Anion gap: 11 (ref 5–15)
BUN: 36 mg/dL — ABNORMAL HIGH (ref 8–23)
BUN: 37 mg/dL — ABNORMAL HIGH (ref 8–23)
CO2: 30 mmol/L (ref 22–32)
CO2: 29 mmol/L (ref 22–32)
Calcium: 8.6 mg/dL — ABNORMAL LOW (ref 8.9–10.3)
Calcium: 8.9 mg/dL (ref 8.9–10.3)
Chloride: 98 mmol/L (ref 98–111)
Chloride: 101 mmol/L (ref 98–111)
Creatinine, Ser: 1.3 mg/dL — ABNORMAL HIGH (ref 0.44–1.00)
Creatinine, Ser: 1.58 mg/dL — ABNORMAL HIGH (ref 0.44–1.00)
GFR, Estimated: 44 mL/min — ABNORMAL LOW (ref >60)
GFR, Estimated: 35 mL/min — ABNORMAL LOW (ref >60)
Glucose, Bld: 125 mg/dL — ABNORMAL HIGH (ref 70–99)
Glucose, Bld: 129 mg/dL — ABNORMAL HIGH (ref 70–99)
Potassium: 5.2 mmol/L — ABNORMAL HIGH (ref 3.5–5.1)
Potassium: 4.6 mmol/L (ref 3.5–5.1)
Sodium: 137 mmol/L (ref 135–145)
Sodium: 141 mmol/L (ref 135–145)

## 2021-07-22 MED ORDER — PREDNISONE 20 MG PO TABS
40.0000 mg | ORAL_TABLET | Freq: Every day | ORAL | Status: DC
  Administered 2021-07-23: 40 mg via ORAL
  Filled 2021-07-22: qty 2

## 2021-07-22 MED ORDER — IVABRADINE HCL 5 MG PO TABS
5.0000 mg | ORAL_TABLET | Freq: Two times a day (BID) | ORAL | 0 refills | Status: DC
  Filled 2021-07-22: qty 60, 30d supply, fill #0

## 2021-07-22 MED ORDER — SACUBITRIL-VALSARTAN 24-26 MG PO TABS
1.0000 | ORAL_TABLET | Freq: Two times a day (BID) | ORAL | 0 refills | Status: DC
  Filled 2021-07-22: qty 60, 30d supply, fill #0

## 2021-07-22 MED ORDER — PREDNISONE 20 MG PO TABS
20.0000 mg | ORAL_TABLET | Freq: Every day | ORAL | 0 refills | Status: AC
  Filled 2021-07-22: qty 2, 2d supply, fill #0

## 2021-07-22 MED ORDER — SODIUM ZIRCONIUM CYCLOSILICATE 10 G PO PACK
10.0000 g | PACK | Freq: Once | ORAL | Status: AC
  Administered 2021-07-22: 10 g via ORAL
  Filled 2021-07-22: qty 1

## 2021-07-22 MED ORDER — FUROSEMIDE 10 MG/ML IJ SOLN
40.0000 mg | Freq: Once | INTRAMUSCULAR | Status: AC
  Administered 2021-07-22: 40 mg via INTRAVENOUS
  Filled 2021-07-22: qty 4

## 2021-07-22 NOTE — Progress Notes (Signed)
Mobility Specialist Progress Note:   07/22/21 1157  Mobility  Activity Ambulated with assistance in hallway  Level of Assistance Standby assist, set-up cues, supervision of patient - no hands on  Assistive Device Front wheel walker  Distance Ambulated (ft) 520 ft  Activity Response Tolerated well  $Mobility charge 1 Mobility   Pt received in bed willing to participate in mobility. No complaints of pain and asymptomatic. Pt left in bed with call bell in reach and all needs met.   [facility] Public librarian Phone [phone] Secondary Phone [phone]

## 2021-07-22 NOTE — Progress Notes (Signed)
Mobility Specialist Progress Note:  SATURATION QUALIFICATIONS: (This note is used to comply with regulatory documentation for home oxygen)  Patient Saturations on Room Air at Rest = 85%  Patient Saturations on Room Air while Ambulating = n/a%  Patient Saturations on 2 Liters of oxygen while Ambulating = 91%-93% Patient Saturations on 3 Liters of oxygen while Ambulating= 96%-98%   Engineer, maintenance (IT) Phone [phone] Secondary Phone [phone]

## 2021-07-22 NOTE — Progress Notes (Signed)
Patient down to 4L O2 overnight.

## 2021-07-22 NOTE — Assessment & Plan Note (Signed)
-   See discussion above, holding Aldactone today -Give Lokelma, repeat BMP this afternoon

## 2021-07-22 NOTE — Progress Notes (Signed)
PROGRESS NOTE    Valerie Rodgers  [id number] DOB: [birth year] DOA: 07/17/2021 PCP: Javier Docker, MD  Brief Narrative:70 y.o. F with hx HTN, COPD, bipolar, polysubstance abuse who presented with few days progressive shortness of breath, and acutely much worse after snorting cocaine. In the ER she was hypoxic to 88%, very tachypneic and in respiratory distress, stabilized with BiPAP.  Troponins 150s, BNP elevated, chest imaging ruled out PE but showed multifocal bilateral opacities.  Admitted and started on Lasix for congestive heart failure.  -Echo noted EF of 25-30% with grade 3 diastolic dysfunction, improving with diuresis  Subjective: -Feeling better overall, breathing improving, on 3 to 4 L O2 this morning  Assessment & Plan:  * Acute congestive heart failure (HCC) Acute systolic and diastolic CHF Echo-EF [id number],, grade 3 diastolic dysfunction -Clinically improving with diuresis, appears euvolemic, transitioned to oral Lasix -Stress test yesterday negative for inducible ischemia, fixed defect noted -Started on Entresto, Aldactone, Corlanor -Kidney function stable, potassium trending up, will hold Aldactone dose today, give Lokelma, recheck BMP this afternoon -Wean O2, will need 2 L O2 at discharge  Acute respiratory failure with hypoxia ([place])- (present on admission) -Had respiratory distress with hypoxic respiratory failure requiring BiPAP on admission  -Secondary to pulmonary edema  -Also being treated for COPD exacerbation, cut down steroids, continue azithromycin, nebs -Weaning O2, check ambulatory sats, taper down steroids,   Cocaine abuse ([place])- (present on admission) Counseling patient on cessation daily Patient reports last use of cocaine was on 1/29  Hyperkalemia - See discussion above, holding Aldactone today -Give Lokelma, repeat BMP this afternoon  Cirrhosis of liver (HCC)- (present on admission) On US-Subtle nodularity of the liver contour suggesting  cirrhosis, 3 mm echogenic focus in the gallbladder which may represent a polyp. -Patient admits to occasional EtOH use but not heavy, uses cocaine regularly -Hepatitis B and C serologies are negative-Will send GI referral at discharge -Lasix, Aldactone as noted above  Chronic kidney disease, stage 3a ([place])- (present on admission) Creatinine stable, monitor   Essential hypertension- (present on admission) Stable, heart failure meds as noted above  Elevated troponin level not due myocardial infarction- (present on admission) Likely secondary to demand ischemia, could have CAD as well -Cardiology following, stress test negative for inducible ischemia -Plan to repeat echo as outpatient and consider cath if EF does not improve   DVT prophylaxis: Lovenox Code Status: Full code Family Communication: No family at bedside Disposition Plan: Home later today or in a.m.  Consultants:  Cardiology  Procedures: Lexiscan MyoviewNuclear stress test 07/21/2021: 1. Negative for ischemia. Fixed small anterior and moderate inferolateral defects: attenuation versus scar. 2. Global hypokinesis with marked LVE. 3. Left ventricular ejection fraction 21% 4. Non invasive risk stratification*: High  Antimicrobials:    Objective: Vitals:   07/22/21 0915 07/22/21 0920 07/22/21 1015 07/22/21 1109  BP:   99/69 106/86  Pulse:    90  Resp:    15  Temp:    97.9 F (36.6 C)  TempSrc:    Oral  SpO2: 94% 95%  94%  Weight:      Height:        Intake/Output Summary (Last 24 hours) at 07/22/2021 1517 Last data filed at 07/22/2021 1249 Gross per 24 hour  Intake 720 ml  Output 575 ml  Net 145 ml   Filed Weights   07/20/21 0400 07/21/21 0212 07/22/21 0100  Weight: 71.8 kg 71.4 kg 72.1 kg    Examination:  General exam: Pleasant  female sitting up in bed, AAOx3, chronically ill-appearing, no distress CVS: S1-S2, regular rhythm Lungs: Improved air movement, otherwise clear Abdomen: Soft, nontender,  bowel sounds present Extremities: No edema  Skin: No rashes Psychiatry:  Mood & affect appropriate.     Data Reviewed:   CBC: Recent Labs  Lab 07/17/21 2206 07/17/21 2349 07/19/21 0130 07/20/21 0542 07/21/21 0227 07/22/21 0126  WBC 14.2*  --  16.5* 13.1* 10.8* 10.1  NEUTROABS 12.0*  --  14.1*  --   --   --   HGB 13.6 13.9 14.9 13.5 14.7 14.9  HCT 39.9 41.0 44.7 40.8 45.9 46.7*  MCV 84.4  --  84.3 84.6 85.0 85.7  PLT 273  --  242 305 366 [id number]   Basic Metabolic Panel: Recent Labs  Lab 07/17/21 2206 07/17/21 2349 07/19/21 0130 07/20/21 0542 07/21/21 0227 07/22/21 0126  NA 137 141 146* 141 141 137  K 4.2 3.8 3.8 3.1* 4.7 5.2*  CL 101  --  103 100 101 98  CO2 24  --  29 30 28 30   GLUCOSE 118*  --  101* 127* 114* 125*  BUN 19  --  27* 37* 30* 36*  CREATININE 1.21*  --  1.24* 1.24* 1.28* 1.30*  CALCIUM 8.9  --  9.0 8.6* 8.7* 8.6*  MG  --   --  2.4  --   --   --    GFR: Estimated Creatinine Clearance: 38.7 mL/min (A) (by C-G formula based on SCr of 1.3 mg/dL (H)). Liver Function Tests: Recent Labs  Lab 07/18/21 0116 07/19/21 0130  AST 52* 56*  ALT 63* 66*  ALKPHOS 122 132*  BILITOT 1.0 0.8  PROT 6.1* 6.8  ALBUMIN 2.4* 2.6*   No results for input(s): LIPASE, AMYLASE in the last 168 hours. No results for input(s): AMMONIA in the last 168 hours. Coagulation Profile: No results for input(s): INR, PROTIME in the last 168 hours. Cardiac Enzymes: No results for input(s): CKTOTAL, CKMB, CKMBINDEX, TROPONINI in the last 168 hours. BNP (last 3 results) No results for input(s): PROBNP in the last 8760 hours. HbA1C: No results for input(s): HGBA1C in the last 72 hours. CBG: Recent Labs  Lab 07/20/21 0735  GLUCAP 129*   Lipid Profile: No results for input(s): CHOL, HDL, LDLCALC, TRIG, CHOLHDL, LDLDIRECT in the last 72 hours. Thyroid Function Tests: No results for input(s): TSH, T4TOTAL, FREET4, T3FREE, THYROIDAB in the last 72 hours. Anemia Panel: No results  for input(s): VITAMINB12, FOLATE, FERRITIN, TIBC, IRON, RETICCTPCT in the last 72 hours. Urine analysis:    Component Value Date/Time   COLORURINE YELLOW 07/18/2021 0529   APPEARANCEUR CLEAR 07/18/2021 0529   LABSPEC 1.020 07/18/2021 0529   PHURINE 5.5 07/18/2021 0529   GLUCOSEU NEGATIVE 07/18/2021 0529   HGBUR NEGATIVE 07/18/2021 0529   BILIRUBINUR NEGATIVE 07/18/2021 0529   KETONESUR NEGATIVE 07/18/2021 0529   PROTEINUR NEGATIVE 07/18/2021 0529   NITRITE NEGATIVE 07/18/2021 0529   LEUKOCYTESUR NEGATIVE 07/18/2021 0529   Sepsis Labs: @LABRCNTIP (procalcitonin:4,lacticidven:4)  ) Recent Results (from the past 240 hour(s))  Resp Panel by RT-PCR (Flu A&B, Covid) Nasopharyngeal Swab     Status: None   Collection Time: 07/17/21 10:08 PM   Specimen: Nasopharyngeal Swab; Nasopharyngeal(NP) swabs in vial transport medium  Result Value Ref Range Status   SARS Coronavirus 2 by RT PCR NEGATIVE NEGATIVE Final    Comment: (NOTE) SARS-CoV-2 target nucleic acids are NOT DETECTED.  The SARS-CoV-2 RNA is generally detectable in upper respiratory specimens during the  acute phase of infection. The lowest concentration of SARS-CoV-2 viral copies this assay can detect is 138 copies/mL. A negative result does not preclude SARS-Cov-2 infection and should not be used as the sole basis for treatment or other patient management decisions. A negative result may occur with  improper specimen collection/handling, submission of specimen other than nasopharyngeal swab, presence of viral mutation(s) within the areas targeted by this assay, and inadequate number of viral copies(<138 copies/mL). A negative result must be combined with clinical observations, patient history, and epidemiological information. The expected result is Negative.  Fact Sheet for Patients:  EntrepreneurPulse.com.au  Fact Sheet for Healthcare Providers:  IncredibleEmployment.be  This test is no  t yet approved or cleared by the [place] FDA and  has been authorized for detection and/or diagnosis of SARS-CoV-2 by FDA under an Emergency Use Authorization (EUA). This EUA will remain  in effect (meaning this test can be used) for the duration of the COVID-19 declaration under Section 564(b)(1) of the Act, 21 U.S.C.section 360bbb-3(b)(1), unless the authorization is terminated  or revoked sooner.       Influenza A by PCR NEGATIVE NEGATIVE Final   Influenza B by PCR NEGATIVE NEGATIVE Final    Comment: (NOTE) The Xpert Xpress SARS-CoV-2/FLU/RSV plus assay is intended as an aid in the diagnosis of influenza from Nasopharyngeal swab specimens and should not be used as a sole basis for treatment. Nasal washings and aspirates are unacceptable for Xpert Xpress SARS-CoV-2/FLU/RSV testing.  Fact Sheet for Patients: EntrepreneurPulse.com.au  Fact Sheet for Healthcare Providers: IncredibleEmployment.be  This test is not yet approved or cleared by the [place] FDA and has been authorized for detection and/or diagnosis of SARS-CoV-2 by FDA under an Emergency Use Authorization (EUA). This EUA will remain in effect (meaning this test can be used) for the duration of the COVID-19 declaration under Section 564(b)(1) of the Act, 21 U.S.C. section 360bbb-3(b)(1), unless the authorization is terminated or revoked.  Performed at [facility], [place] [address] [place], [place] [address]   Culture, blood (routine x 2)     Status: None (Preliminary result)   Collection Time: 07/18/21  3:42 AM   Specimen: BLOOD  Result Value Ref Range Status   Specimen Description BLOOD SITE NOT SPECIFIED  Final   Special Requests   Final    BOTTLES DRAWN AEROBIC AND ANAEROBIC Blood Culture adequate volume   Culture   Final    NO GROWTH 4 DAYS Performed at [facility], [address], [place], [place] [address]    Report Status PENDING  Incomplete   Culture, blood (routine x 2)     Status: None (Preliminary result)   Collection Time: 07/18/21  5:03 AM   Specimen: BLOOD RIGHT HAND  Result Value Ref Range Status   Specimen Description BLOOD RIGHT HAND  Final   Special Requests   Final    BOTTLES DRAWN AEROBIC AND ANAEROBIC Blood Culture adequate volume   Culture   Final    NO GROWTH 4 DAYS Performed at [facility], [place] [address], [place], [place] [address]    Report Status PENDING  Incomplete     Radiology Studies: NM Myocar Multi W/Spect W/Wall Motion / EF  Result Date: 07/21/2021 CLINICAL DATA:  Heart failure EXAM: MYOCARDIAL IMAGING WITH SPECT (REST AND PHARMACOLOGIC-STRESS) GATED LEFT VENTRICULAR WALL MOTION STUDY LEFT VENTRICULAR EJECTION FRACTION TECHNIQUE: Standard myocardial SPECT imaging was performed after resting intravenous injection of 10.7 mCi Tc-48m Myoview. Subsequently, intravenous  infusion of Lexiscan was performed under the supervision of the Cardiology staff. At peak effect of the drug, 32 mCi Tc-31m Myoview was injected intravenously and standard myocardial SPECT imaging was performed. Quantitative gated imaging was also performed to evaluate left ventricular wall motion, and estimate left ventricular ejection fraction. COMPARISON:  None. FINDINGS: Perfusion: No decreased activity in the left ventricle isolated on stress imaging to suggest reversible ischemia. Moderate area of moderately decreased activity involving the inferolateral aspect of the left ventricular myocardium. Small area of mildly decreased activity involving the anterior left ventricular myocardium. Wall Motion: Global hypokinesis. Severe left ventricular dilatation. Left Ventricular Ejection Fraction: 21 % End diastolic volume [id number] ml End systolic volume [id number] ml IMPRESSION: 1. Negative for ischemia. Fixed small anterior and moderate inferolateral defects: attenuation versus scar. 2. Global hypokinesis with marked LVE. 3. Left ventricular ejection  fraction 21% 4. Non invasive risk stratification*: High *2012 Appropriate Use Criteria for Coronary Revascularization Focused Update: J Am Coll Cardiol. [id number]. http://content.airportbarriers.com.aspx?articleid=1201161 Electronically Signed   By: Lucrezia Europe M.D.   On: 07/21/2021 14:03     Scheduled Meds:  azithromycin  250 mg Oral Daily   enoxaparin (LOVENOX) injection  40 mg Subcutaneous Daily   fluticasone furoate-vilanterol  1 puff Inhalation Daily   ipratropium-albuterol  3 mL Nebulization BID   ivabradine  5 mg Oral BID WC   mouth rinse  15 mL Mouth Rinse BID   methylPREDNISolone (SOLU-MEDROL) injection  40 mg Intravenous Daily   sacubitril-valsartan  1 tablet Oral BID   Continuous Infusions:   LOS: 4 days    Time spent: 52min    Domenic Polite, MD [facility]   07/22/2021, 3:17 PM

## 2021-07-22 NOTE — TOC Progression Note (Addendum)
Transition of Care [facility]) - Progression Note    Patient Details  Name: Valerie Rodgers MRN: [id number] Date of Birth: May 20, 1951  [facility] CM/SW Contact  Leone Haven, RN Phone Number: 07/22/2021, 9:46 AM  Clinical Narrative:    Patient lives with  room mate  conts on 3 liters of oxygen, NCM asked if she has a pill box for medications she states she takes her meds ok by herself, she states she does not need a HHRN to help with medications.  She states she is ok with [facility] supplying her oxygen when she goes home. Will need oxygen order and ambulatory sats, Patient states she will  have transportation at dc by her  Felix Pacini [place] [phone]. NCM asked Staff RN for ambulatory sats. Per MD patient potassium is hight , will get another BMP, and weaning oxygen, will be here one more day. NCM will ask patient about getting a HHRN again. NCM offered choice again, she states ok she will let me set up the [facility], she does not have a prefereence,  she states she has a Nurse thru [employer] that comes out also.  NCM made referral to [facility] with [facility] for disease management and medication management.  She is able to take referral.  Soc will begin 24 to 48 hrs post dc. TOC pharmacy filled medications ,will be in the Main Pharmacy.         Expected Discharge Plan and Services                                                 Social Determinants of Health (SDOH) Interventions    Readmission Risk Interventions No flowsheet data found.

## 2021-07-22 NOTE — Care Management Important Message (Signed)
Important Message  Patient Details  Name: Valerie Rodgers MRN: [id number] Date of Birth: 28-Sep-1950   Medicare Important Message Given:  Yes     Jarielis, Marley 07/22/2021, 8:20 AM

## 2021-07-22 NOTE — Progress Notes (Signed)
Subjective:  Breathing improved O2 requirement down to 3 L  Stress test reviewed, details below   Objective:  Vital Signs in the last 24 hours: Temp:  [97.6 F (36.4 C)-98.4 F (36.9 C)] 97.9 F (36.6 C) (02/03 1109) Pulse Rate:  [75-92] 90 (02/03 1109) Resp:  [15-22] 15 (02/03 1109) BP: (89-109)/(53-86) 106/86 (02/03 1109) SpO2:  [90 %-98 %] 94 % (02/03 1109) Weight:  [72.1 kg] 72.1 kg (02/03 0100)  Intake/Output from previous day: 02/02 0701 - 02/03 0700 In: 600 [P.O.:600] Out: 625 [Urine:625]  Physical Exam Vitals and nursing note reviewed.  Constitutional:      General: She is not in acute distress. Neck:     Vascular: No JVD.  Cardiovascular:     Rate and Rhythm: Normal rate and regular rhythm.     Heart sounds: Normal heart sounds. No murmur heard. Pulmonary:     Effort: No respiratory distress.     Breath sounds: Examination of the right-lower field reveals rales. Examination of the left-lower field reveals rales. Rales present. No decreased breath sounds, wheezing or rhonchi.  Musculoskeletal:     Right lower leg: No edema.     Left lower leg: No edema.     Lab Results: Reviewed and interpreted: BMP, Cr stable  Imaging/tests reviewed and independently interpreted: Stress test 07/21/2021 Telemetry 07/21/2021: Shows multiple episodes of wide complex rhythm in 80s-likel AIVR  Tests ordered:   Cardiac Studies:  Nuclear stress test 07/21/2021: 1. Negative for ischemia. Fixed small anterior and moderate inferolateral defects: attenuation versus scar. 2. Global hypokinesis with marked LVE. 3. Left ventricular ejection fraction 21% 4. Non invasive risk stratification*: High  Telemetry 07/19/2021: Brief episodes of probable atrial tachycardia  EKG 07/17/2021: Sinus rhythm 92 bpm Frequent PVC  Echocardiogram 07/18/2021:  1. Left ventricular ejection fraction, by estimation, is 25 to 30%. The  left ventricle has severely decreased function. The left  ventricle  demonstrates global hypokinesis. Left ventricular diastolic parameters are  consistent with Grade III diastolic dysfunction (restrictive).   2. Right ventricular systolic function is normal. The right ventricular  size is normal.   3. Left atrial size was severely dilated.   4. The mitral valve is normal in structure. Trivial mitral valve  regurgitation. No evidence of mitral stenosis.   5. The aortic valve is normal in structure. Aortic valve regurgitation is  not visualized. No aortic stenosis is present.   6. The inferior vena cava is normal in size with greater than 50%  respiratory variability, suggesting right atrial pressure of 3 mmHg.  Personally reviewed. LV appears mildly dilated.   Assessment & Recommendations:  71 y.o. African American female  with hypertension, COPD, bipolar disorder, recent cocaine abuse, admitted with acute hypoxic respiratory failure, congestive heart failure, cocaine positive.    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 EF 25-30%, as well as possible COPD exacerbation-managed by the primary team. CXR showed improvement (1/31) Acute decompensation has continued to improve, O2 requirement down to 3 L Differentials include cocaine or ?alcohol induced nonischemic cardiomyopathy or less likely, ischemic cardiomyopathy Stress test with moderate inferolateral and small anterior fixed perfusion defect, no ischemia.  Global hypokinesis with LVEF 21%. Suspicion for obstructive CAD remains lower in absence of ischemia and angina.  If EF does not improve with caloric medical therapy, will consider coronary angiography outpatient. I/O probably not accurate. Continue spironolactone 12.5 mg daily Continue p.o. lasix 40 mg daily Continue Entresto  24-26 mg bid, corlanor 5 mg bid, spironolactone 12.5 mg daily Oral K replacement is now discontinued. Unable to use [place] due to  liver dysfunction, unable to use beta blockers due to recent cocaine use. Will consider outpatient. Counseled the patient regarding need for strict abstinence from cocaine on 1/30.    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  I do not think this is ventricular tachycardia. Continue guideline directed medical therapy for heart failure.   Abnormal LFT: Cirrhosis noted on liver US. She denies significant alcohol use.  Differentials include congestive hepatopathy versus primary liver pathology. Recommend spironolactone and Lasix as above. Defer workup to primary team  Discussed interpretation and management recommendations with the primary team Dr Broadus John  Outpatient follow up arranged w/Celeste Shawne[place], [place] 07/28/2021 1:15 PM   Nigel Mormon, MD Pager: [phone] Office: [phone]

## 2021-07-22 NOTE — Progress Notes (Signed)
PRN BIPAP order,no distress noted at this time.

## 2021-07-23 DIAGNOSIS — J9621 Acute and chronic respiratory failure with hypoxia: Secondary | ICD-10-CM

## 2021-07-23 LAB — BASIC METABOLIC PANEL
Anion gap: 13 (ref 5–15)
BUN: 42 mg/dL — ABNORMAL HIGH (ref 8–23)
CO2: 26 mmol/L (ref 22–32)
Calcium: 8.7 mg/dL — ABNORMAL LOW (ref 8.9–10.3)
Chloride: 101 mmol/L (ref 98–111)
Creatinine, Ser: 1.53 mg/dL — ABNORMAL HIGH (ref 0.44–1.00)
GFR, Estimated: 36 mL/min — ABNORMAL LOW (ref >60)
Glucose, Bld: 118 mg/dL — ABNORMAL HIGH (ref 70–99)
Potassium: 4.8 mmol/L (ref 3.5–5.1)
Sodium: 140 mmol/L (ref 135–145)

## 2021-07-23 LAB — CULTURE, BLOOD (ROUTINE X 2)
Culture: NO GROWTH
Culture: NO GROWTH
Special Requests: ADEQUATE
Special Requests: ADEQUATE

## 2021-07-23 MED ORDER — FUROSEMIDE 40 MG PO TABS
40.0000 mg | ORAL_TABLET | Freq: Every day | ORAL | Status: DC
  Administered 2021-07-23: 40 mg via ORAL
  Filled 2021-07-23: qty 1

## 2021-07-23 MED ORDER — FUROSEMIDE 40 MG PO TABS: 40.0000 mg | ORAL_TABLET | Freq: Every day | ORAL | 0 refills | Status: DC

## 2021-07-23 NOTE — Progress Notes (Signed)
Patient discharged: Home with friend.  °Left via: Wheelchair  °Discharge paperwork reviewed and given: to patient and family. Teach back completed. °IV and telemetry disconnected. °Belongings given to patient. ° ° °

## 2021-07-23 NOTE — Progress Notes (Signed)
Received referral to assist with home O2. Per previous CM note, pt agrees to use [facility] for DME referral. Contacted Jasmine with [facility] for O2 referral. She accepted the referral. Pt also has an order for a rollator. Per [place], the rollator referral was called in yesterday. Notified [facility] that pt has been D/C today.

## 2021-07-23 NOTE — Progress Notes (Signed)
Completed pt education and removed patient's IV. Pt is awaiting O2 to be delivered so she can leave.

## 2021-07-23 NOTE — Progress Notes (Signed)
Subjective:  Breathing improved She is not requiring oxygen at rest   Objective:  Vital Signs in the last 24 hours: Temp:  [97.9 F (36.6 C)-98.6 F (37 C)] 98.6 F (37 C) (02/04 0817) Pulse Rate:  [46-90] 61 (02/04 0817) Resp:  [15-21] 20 (02/04 0817) BP: (91-141)/(50-108) 127/94 (02/04 0817) SpO2:  [91 %-98 %] 95 % (02/04 0817) Weight:  [73 kg] 73 kg (02/04 0300)  Intake/Output from previous day: 02/03 0701 - 02/04 0700 In: 480 [P.O.:480] Out: 1150 [Urine:1150]  Physical Exam Vitals and nursing note reviewed.  Constitutional:      General: She is not in acute distress. Neck:     Vascular: No JVD.  Cardiovascular:     Rate and Rhythm: Normal rate and regular rhythm.     Heart sounds: Normal heart sounds. No murmur heard. Pulmonary:     Effort: No respiratory distress.     Breath sounds: No decreased breath sounds, wheezing, rhonchi or rales.  Musculoskeletal:     Right lower leg: No edema.     Left lower leg: No edema.     Lab Results: Reviewed and interpreted: BMP, Cr 1.30--> 1.58--> 1.53  Imaging/tests reviewed and independently interpreted: Telemetry 07/23/2021: Frequent PACs, PVCs, no definite evidence of A. Fib  Tests ordered: None  Cardiac Studies:  Nuclear stress test 07/21/2021: 1. Negative for ischemia. Fixed small anterior and moderate inferolateral defects: attenuation versus scar. 2. Global hypokinesis with marked LVE. 3. Left ventricular ejection fraction 21% 4. Non invasive risk stratification*: High  Telemetry 07/19/2021: Brief episodes of probable atrial tachycardia  EKG 07/17/2021: Sinus rhythm 92 bpm Frequent PVC  Echocardiogram 07/18/2021:  1. Left ventricular ejection fraction, by estimation, is 25 to 30%. The  left ventricle has severely decreased function. The left ventricle  demonstrates global hypokinesis. Left ventricular diastolic parameters are  consistent with Grade III diastolic dysfunction (restrictive).   2. Right  ventricular systolic function is normal. The right ventricular  size is normal.   3. Left atrial size was severely dilated.   4. The mitral valve is normal in structure. Trivial mitral valve  regurgitation. No evidence of mitral stenosis.   5. The aortic valve is normal in structure. Aortic valve regurgitation is  not visualized. No aortic stenosis is present.   6. The inferior vena cava is normal in size with greater than 50%  respiratory variability, suggesting right atrial pressure of 3 mmHg.  Personally reviewed. LV appears mildly dilated.   Assessment & Recommendations:  71 y.o. African American female  with hypertension, COPD, bipolar disorder, recent cocaine abuse, admitted with acute hypoxic respiratory failure, congestive heart failure, cocaine positive.    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 EF 25-30%, as well as possible COPD exacerbation-managed by the primary team. Acute decompensation is now resolved. Differentials include cocaine or ?alcohol induced nonischemic cardiomyopathy or less likely, ischemic cardiomyopathy Stress test with moderate inferolateral and small anterior fixed perfusion defect, no ischemia.  Global hypokinesis with LVEF 21%. Suspicion for obstructive CAD remains lower in absence of ischemia and angina.  If EF does not improve with guideline directed medical therapy, will consider coronary angiography outpatient. Continue Entresto 24-26 mg bid, corlanor 5 mg bid, spironolactone 12.5 mg daily, can reduce Lasix to as needed on discharge Unable to use Farxiga due to liver dysfunction, avoiding beta blockers due to recent cocaine use. Will consider outpatient. Counseled the patient regarding need for strict abstinence from  cocaine multiple times during this hospitalization.  That said, it was reported by the nursing staff the patient unequivocally stated that she will use  cocaine again.  Unfortunately, this portends a poor prognosis for the patient.  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  I do not think this is ventricular tachycardia. Continue guideline directed medical therapy for heart failure.   Abnormal LFT: Cirrhosis noted on liver US. She denies significant alcohol use.  Differentials include congestive hepatopathy versus primary liver pathology. Recommend spironolactone and Lasix as above. Defer workup to primary team  Okay to discharge from cardiac standpoint.  Outpatient follow-up arranged. Outpatient follow up arranged w/Celeste Cantwel, [place] 07/28/2021 1:15 PM   Nigel Mormon, MD Pager: [phone] Office: [phone]

## 2021-07-23 NOTE — Progress Notes (Signed)
Received cal from [facility] pt had 12 beat run of VT, strip reviewed. Patient assessed pt awake and turning in bed no complains of pain nor shortness of breath.Awaiting labs.

## 2021-07-23 NOTE — Discharge Summary (Signed)
Physician Discharge Summary  Valerie Rodgers [id number] DOB: [birth year] DOA: 07/17/2021  PCP: Javier Docker, MD  Admit date: 07/17/2021 Discharge date: 07/23/2021  Time spent: 35 minutes  Recommendations for Outpatient Follow-up:  [facility] cardiology on 2/9, please check BMP at follow-up, consider restarting Aldactone if potassium stable, further GDMT as tolerated Discharged home on home O2 PCP in 1 week Home health PT, RN Gastroenterology referral sent for liver cirrhosis   Discharge Diagnoses:  Principal Problem:   Acute systolic and diastolic congestive heart failure (HCC) Active Problems:   Acute on chronic respiratory failure with hypoxia (HCC)   Cocaine abuse (HCC)   Elevated troponin level not due myocardial infarction   Bipolar disorder (HCC)   Elevated d-dimer   Essential hypertension   Chronic kidney disease, stage 3a (HCC)   Cirrhosis of liver (HCC)   HFrEF (heart failure with reduced ejection fraction) ([place])   Hyperkalemia   Discharge Condition: Stable  Diet recommendation: Low-sodium, heart healthy  Filed Weights   07/21/21 0212 07/22/21 0100 07/23/21 0300  Weight: 71.4 kg 72.1 kg 73 kg    History of present illness:  71 y.o. F with hx HTN, COPD, bipolar, polysubstance abuse who presented with few days progressive shortness of breath, and acutely much worse after snorting cocaine. In the ER she was hypoxic to 88%, very tachypneic and in respiratory distress, stabilized with BiPAP.  Troponins 150s, BNP elevated, chest imaging ruled out PE but showed multifocal bilateral opacities.  Admitted and started on Lasix for congestive heart failure.  -Echo noted EF of 25-30% with grade 3 diastolic dysfunction, improving with diuresis  Hospital Course:   Acute congestive heart failure (HCC) Acute systolic and diastolic CHF Echo-EF [id number],, grade 3 diastolic dysfunction -Clinically improved with diuresis, now euvolemic, transitioned to oral Lasix -Stress test  was negative for inducible ischemia, fixed defect noted -Started on Entresto, Aldactone, Corlanor, Lasix -Kidney function stable, potassium trending up to 5.2 yesterday, held Aldactone dose and given Lokelma, low-dose Entresto continued, Aldactone held at discharge -Can be restarted as outpatient if potassium stays stable -O2 weaned down to 2 L at discharge   Acute respiratory failure with hypoxia ([place])- (present on admission) -Had respiratory distress with hypoxic respiratory failure requiring BiPAP on admission  -Secondary to pulmonary edema and COPD exacerbation -Treated with diuretics as noted above in addition also with steroids, nebulizers and antibiotics -Weaned down to 2 L of O2, set up home O2 at discharge -Follow-up with PCP in 1 week    Cocaine abuse ([place])- (present on admission) -Counseled   Hyperkalemia - See discussion above, holding Aldactone today -Give Lokelma, repeat BMP this afternoon   Cirrhosis of liver ([place])- (present on admission) On US-Subtle nodularity of the liver contour suggesting cirrhosis, 3 mm echogenic focus in the gallbladder which may represent a polyp. -Patient admits to occasional EtOH use but not heavy, uses cocaine regularly -Hepatitis B and C serologies are negative-Will send GI referral at discharge -Lasix  as noted above, add Aldactone as outpatient if potassium stabilizes   Chronic kidney disease, stage 3a ([place])- (present on admission) Creatinine stable, monitor   Essential hypertension- (present on admission) Stable, heart failure meds as noted above   Elevated troponin level not due myocardial infarction- (present on admission) Likely secondary to demand ischemia, could have CAD as well -Cardiology following, stress test negative for inducible ischemia -Plan to repeat echo as outpatient and consider cath if EF does not improve      Consultants:  Cardiology  Procedures: Lexiscan MyoviewNuclear stress test 07/21/2021: 1. Negative for  ischemia. Fixed small anterior and moderate inferolateral defects: attenuation versus scar. 2. Global hypokinesis with marked LVE. 3. Left ventricular ejection fraction 21% 4. Non invasive risk stratification*: High    Discharge Exam: Vitals:   07/23/21 0713 07/23/21 0817  BP:  (!) 127/94  Pulse:  61  Resp:  20  Temp:  98.6 F (37 C)  SpO2: 95% 95%  General exam: Pleasant female sitting up in bed, AAOx3, chronically ill-appearing, no distress CVS: S1-S2, regular rhythm Lungs: Improved air movement, otherwise clear Abdomen: Soft, nontender, bowel sounds present Extremities: No edema  Skin: No rashes Psychiatry:  Mood & affect appropriate.    Discharge Instructions   Discharge Instructions     Diet - low sodium heart healthy   Complete by: As directed    Increase activity slowly   Complete by: As directed       Allergies as of 07/23/2021   No Known Allergies      Medication List     STOP taking these medications    amLODipine 5 MG tablet Commonly known as: NORVASC   hydrochlorothiazide 12.5 MG tablet Commonly known as: HYDRODIURIL   lisinopril 10 MG tablet Commonly known as: ZESTRIL       TAKE these medications    acetaminophen 500 MG tablet Commonly known as: TYLENOL Take 1,000 mg by mouth every 6 (six) hours as needed (pain).   albuterol 108 (90 Base) MCG/ACT inhaler Commonly known as: VENTOLIN HFA 2 puffs every 6 (six) hours as needed for wheezing or shortness of breath.   Entresto 24-26 MG Generic drug: sacubitril-valsartan Take 1 tablet by mouth 2 (two) times daily.   furosemide 40 MG tablet Commonly known as: LASIX Take 1 tablet (40 mg total) by mouth daily.   ivabradine 5 MG Tabs tablet Commonly known as: CORLANOR Take 1 tablet (5 mg total) by mouth 2 (two) times daily with a meal.   multivitamin with minerals Tabs tablet Take 1 tablet by mouth daily.   predniSONE 20 MG tablet Commonly known as: DELTASONE Take 1 tablet (20 mg  total) by mouth daily with breakfast for 2 days.   Symbicort 160-4.5 MCG/ACT inhaler Generic drug: budesonide-formoterol Inhale 2 puffs into the lungs daily.               Durable Medical Equipment  (From admission, onward)           Start     Ordered   07/22/21 1432  For home use only DME 4 wheeled rolling walker with seat  Once       Question:  Patient needs a walker to treat with the following condition  Answer:  Weakness   07/22/21 1432           No Known Allergies  Follow-up Information     [place], Ralene Bathe, MD Follow up.   Specialty: Internal Medicine Contact information: [address] [place] [address] [phone]         Alethia Berthold, PA-C Follow up on 07/28/2021.   Specialty: Cardiology Why: 1:15 PM Contact information: [place] [address] [facility] Follow up.   Why: rollator, oxygen Contact information: [place] [address] [phone]         [facility], [place] Follow up.   Specialty: Home Health Services Why: [facility]  will contact you for apt times Contact information: [place]  [address] [phone]                  The results of significant diagnostics from this hospitalization (including imaging, microbiology, ancillary and laboratory) are listed below for reference.    Significant Diagnostic Studies: CT Angio Chest PE W/Cm &/Or Wo Cm  Result Date: 07/18/2021 CLINICAL DATA:  Shortness of breath, hypoxia EXAM: CT ANGIOGRAPHY CHEST WITH CONTRAST TECHNIQUE: Multidetector CT imaging of the chest was performed using the standard protocol during bolus administration of intravenous contrast. Multiplanar CT image reconstructions and MIPs were obtained to evaluate the vascular anatomy. RADIATION DOSE REDUCTION: This exam was performed according to the departmental dose-optimization  program which includes automated exposure control, adjustment of the mA and/or kV according to patient size and/or use of iterative reconstruction technique. CONTRAST:  82mL OMNIPAQUE IOHEXOL 350 MG/ML SOLN COMPARISON:  Chest radiograph dated 07/17/2021 FINDINGS: Cardiovascular: Satisfactory opacification the bilateral pulmonary arteries to the segmental level. No evidence of pulmonary embolism. Study is not tailored for evaluation of the thoracic aorta. No evidence of thoracic aortic aneurysm. Cardiomegaly.  No pericardial effusion. Mild coronary atherosclerosis of the LAD and left circumflex. Mediastinum/Nodes: No suspicious mediastinal lymphadenopathy. Visualized thyroid is unremarkable. Lungs/Pleura: Multifocal patchy opacities, right lower lobe predominant. This appearance favors multifocal pneumonia over interstitial edema. Trace pleural fluid bilaterally. No suspicious pulmonary nodules. Mild centrilobular and paraseptal emphysematous changes, upper lung predominant. No pneumothorax. Upper Abdomen: Visualized upper abdomen is grossly unremarkable, noting vascular calcifications. Musculoskeletal: Mild degenerative changes of the mid thoracic spine. Review of the MIP images confirms the above findings. IMPRESSION: No evidence of pulmonary embolism. Multifocal patchy opacities, right lower lobe predominant, favoring multifocal pneumonia over interstitial edema. Emphysema (ICD10-J43.9). Electronically Signed   By: Julian Hy M.D.   On: 07/18/2021 03:13   NM Myocar Multi W/Spect W/Wall Motion / EF  Result Date: 07/21/2021 CLINICAL DATA:  Heart failure EXAM: MYOCARDIAL IMAGING WITH SPECT (REST AND PHARMACOLOGIC-STRESS) GATED LEFT VENTRICULAR WALL MOTION STUDY LEFT VENTRICULAR EJECTION FRACTION TECHNIQUE: Standard myocardial SPECT imaging was performed after resting intravenous injection of 10.7 mCi Tc-10m Myoview. Subsequently, intravenous infusion of Lexiscan was performed under the supervision of the  Cardiology staff. At peak effect of the drug, 32 mCi Tc-40m Myoview was injected intravenously and standard myocardial SPECT imaging was performed. Quantitative gated imaging was also performed to evaluate left ventricular wall motion, and estimate left ventricular ejection fraction. COMPARISON:  None. FINDINGS: Perfusion: No decreased activity in the left ventricle isolated on stress imaging to suggest reversible ischemia. Moderate area of moderately decreased activity involving the inferolateral aspect of the left ventricular myocardium. Small area of mildly decreased activity involving the anterior left ventricular myocardium. Wall Motion: Global hypokinesis. Severe left ventricular dilatation. Left Ventricular Ejection Fraction: 21 % End diastolic volume [id number] ml End systolic volume Q000111Q ml IMPRESSION: 1. Negative for ischemia. Fixed small anterior and moderate inferolateral defects: attenuation versus scar. 2. Global hypokinesis with marked LVE. 3. Left ventricular ejection fraction 21% 4. Non invasive risk stratification*: High *2012 Appropriate Use Criteria for Coronary Revascularization Focused Update: J Am Coll Cardiol. [id number]. http://content.airportbarriers.com.aspx?articleid=1201161 Electronically Signed   By: Lucrezia Europe M.D.   On: 07/21/2021 14:03   DG Chest Port 1 View  Result Date: 07/19/2021 CLINICAL DATA:  Pulmonary edema EXAM: PORTABLE CHEST 1 VIEW COMPARISON:  07/17/2021 FINDINGS: Transverse diameter of heart is increased. Central pulmonary vessels are less prominent. There is significant  interval decrease in interstitial and alveolar markings in the both parahilar regions and lower lung fields. There are residual patchy interstitial infiltrates in the left parahilar region and right lower lung fields. Lateral CP angles are clear. There is no pneumothorax. IMPRESSION: Cardiomegaly. There is interval decrease in pulmonary vascular congestion. There is partial clearing of pulmonary  edema. Residual increased markings in the left mid and right lower lung fields suggest resolving asymmetric pulmonary edema or underlying pneumonia. Electronically Signed   By: Elmer Picker M.D.   On: 07/19/2021 13:18   DG Chest Portable 1 View  Result Date: 07/17/2021 CLINICAL DATA:  Shortness of breath. EXAM: PORTABLE CHEST 1 VIEW COMPARISON:  None. FINDINGS: The heart is enlarged. Diffuse interstitial and airspace opacities are seen throughout both lungs and central predominance. No pleural effusion or pneumothorax. No acute fractures. IMPRESSION: 1. Cardiomegaly. 2. Findings suggestive of moderate pulmonary edema. Infection not excluded Electronically Signed   By: Ronney Asters M.D.   On: 07/17/2021 22:28   ECHOCARDIOGRAM COMPLETE  Result Date: 07/18/2021    ECHOCARDIOGRAM REPORT   Patient Name:   Valerie Rodgers Date of Exam: 07/18/2021 Medical Rec #:  [id number]       Height:       64.0 in Accession #:    [id number]      Weight:       167.0 lb Date of Birth:  Mar 14, 1951        BSA:          1.812 m Patient Age:    10 years        BP:           98/71 mmHg Patient Gender: F               HR:           102 bpm. Exam Location:  Inpatient Procedure: 2D Echo, Cardiac Doppler and Color Doppler Indications:    I50.21 ACUTE CHF  History:        Patient has prior history of Echocardiogram examinations, most                 recent 05/31/2021.  Sonographer:    Beryle Beams Referring Phys: [id number] Vernelle Emerald  Sonographer Comments: Echo performed with patient supine and on artificial respirator. NO SSN IMPRESSIONS  1. Left ventricular ejection fraction, by estimation, is 25 to 30%. The left ventricle has severely decreased function. The left ventricle demonstrates global hypokinesis. Left ventricular diastolic parameters are consistent with Grade III diastolic dysfunction (restrictive).  2. Right ventricular systolic function is normal. The right ventricular size is normal.  3. Left atrial size was  severely dilated.  4. The mitral valve is normal in structure. Trivial mitral valve regurgitation. No evidence of mitral stenosis.  5. The aortic valve is normal in structure. Aortic valve regurgitation is not visualized. No aortic stenosis is present.  6. The inferior vena cava is normal in size with greater than 50% respiratory variability, suggesting right atrial pressure of 3 mmHg. FINDINGS  Left Ventricle: Left ventricular ejection fraction, by estimation, is 25 to 30%. The left ventricle has severely decreased function. The left ventricle demonstrates global hypokinesis. The left ventricular internal cavity size was normal in size. There is no left ventricular hypertrophy. Left ventricular diastolic parameters are consistent with Grade III diastolic dysfunction (restrictive). Right Ventricle: The right ventricular size is normal. No increase in right ventricular wall thickness. Right ventricular systolic function is normal. Left Atrium: Left  atrial size was severely dilated. Right Atrium: Right atrial size was normal in size. Pericardium: There is no evidence of pericardial effusion. Mitral Valve: The mitral valve is normal in structure. Trivial mitral valve regurgitation. No evidence of mitral valve stenosis. MV peak gradient, 47.6 mmHg. The mean mitral valve gradient is 34.0 mmHg. Tricuspid Valve: The tricuspid valve is normal in structure. Tricuspid valve regurgitation is mild . No evidence of tricuspid stenosis. Aortic Valve: The aortic valve is normal in structure. Aortic valve regurgitation is not visualized. Aortic regurgitation PHT measures 269 msec. No aortic stenosis is present. Aortic valve mean gradient measures 4.0 mmHg. Aortic valve peak gradient measures 7.4 mmHg. Aortic valve area, by VTI measures 1.63 cm. Pulmonic Valve: The pulmonic valve was normal in structure. Pulmonic valve regurgitation is not visualized. No evidence of pulmonic stenosis. Aorta: The aortic root is normal in size and  structure. Venous: The inferior vena cava is normal in size with greater than 50% respiratory variability, suggesting right atrial pressure of 3 mmHg. IAS/Shunts: No atrial level shunt detected by color flow Doppler.  LEFT VENTRICLE PLAX 2D LVIDd:         5.50 cm      Diastology LVIDs:         4.80 cm      LV e' medial:    5.00 cm/s LV PW:         0.90 cm      LV E/e' medial:  23.6 LV IVS:        0.70 cm      LV e' lateral:   5.22 cm/s LVOT diam:     2.10 cm      LV E/e' lateral: 22.6 LV SV:         37 LV SV Index:   20 LVOT Area:     3.46 cm  LV Volumes (MOD) LV vol d, MOD A2C: 74.2 ml LV vol d, MOD A4C: 107.0 ml LV vol s, MOD A2C: 80.0 ml LV vol s, MOD A4C: 60.0 ml LV SV MOD A2C:     -5.8 ml LV SV MOD A4C:     107.0 ml LV SV MOD BP:      22.5 ml RIGHT VENTRICLE            IVC RV S prime:     9.46 cm/s  IVC diam: 2.30 cm RVOT diam:      2.30 cm TAPSE (M-mode): 2.0 cm LEFT ATRIUM              Index        RIGHT ATRIUM           Index LA diam:        3.10 cm  1.71 cm/m   RA Area:     14.60 cm LA Vol (A2C):   88.6 ml  48.90 ml/m  RA Volume:   36.60 ml  20.20 ml/m LA Vol (A4C):   108.0 ml 59.60 ml/m LA Biplane Vol: 99.8 ml  55.08 ml/m  AORTIC VALVE                    PULMONIC VALVE AV Area (Vmax):    1.67 cm     PV Vmax:       0.52 m/s AV Area (Vmean):   1.65 cm     PV Vmean:      33.500 cm/s AV Area (VTI):     1.63 cm     PV VTI:  0.087 m AV Vmax:           136.00 cm/s  PV Peak grad:  1.1 mmHg AV Vmean:          87.500 cm/s  PV Mean grad:  1.0 mmHg AV VTI:            0.228 m AV Peak Grad:      7.4 mmHg AV Mean Grad:      4.0 mmHg LVOT Vmax:         65.50 cm/s LVOT Vmean:        41.600 cm/s LVOT VTI:          0.107 m LVOT/AV VTI ratio: 0.47 AI PHT:            269 msec  AORTA Ao Root diam: 2.60 cm Ao Asc diam:  3.10 cm MITRAL VALVE MV Area (PHT): 5.54 cm     SHUNTS MV Area VTI:   0.33 cm     Systemic VTI:  0.11 m MV Peak grad:  47.6 mmHg    Systemic Diam: 2.10 cm MV Mean grad:  34.0 mmHg    Pulmonic  Diam: 2.30 cm MV Vmax:       3.45 m/s MV Vmean:      276.0 cm/s MV Decel Time: 137 msec MV E velocity: 118.00 cm/s MV A velocity: 117.30 cm/s MV E/A ratio:  1.01 Kardie Tobb DO Electronically signed by Berniece Salines DO Signature Date/Time: 07/18/2021/3:44:30 PM    Final    VAS [place] LOWER EXTREMITY VENOUS (DVT)  Result Date: 07/18/2021  Lower Venous DVT Study Patient Name:  Valerie Rodgers  Date of Exam:   07/18/2021 Medical Rec #: [id number]        Accession #:    [id number] Date of Birth: 04-06-1951         Patient Gender: F Patient Age:   11 years Exam Location:  [facility] Procedure:      VAS [place] LOWER EXTREMITY VENOUS (DVT) Referring Phys: Inda Merlin --------------------------------------------------------------------------------  Indications: SOB.  Risk Factors: Cocaine and ETOH abuse. Limitations: BiPap, constant movement. Comparison Study: No prior study on file Performing Technologist: Sharion Dove RVS  Examination Guidelines: A complete evaluation includes B-mode imaging, spectral Doppler, color Doppler, and power Doppler as needed of all accessible portions of each vessel. Bilateral testing is considered an integral part of a complete examination. Limited examinations for reoccurring indications may be performed as noted. The reflux portion of the exam is performed with the patient in reverse Trendelenburg.  +---------+---------------+---------+-----------+----------+--------------+  RIGHT     Compressibility Phasicity Spontaneity Properties Thrombus Aging  +---------+---------------+---------+-----------+----------+--------------+  CFV       Full            Yes       Yes                                    +---------+---------------+---------+-----------+----------+--------------+  SFJ       Full                                                             +---------+---------------+---------+-----------+----------+--------------+  FV Prox   Full                                                              +---------+---------------+---------+-----------+----------+--------------+  FV Mid    Full                                                             +---------+---------------+---------+-----------+----------+--------------+  FV Distal Full                                                             +---------+---------------+---------+-----------+----------+--------------+  PFV       Full                                                             +---------+---------------+---------+-----------+----------+--------------+  POP       Full            Yes       Yes                                    +---------+---------------+---------+-----------+----------+--------------+  PTV       Full                                                             +---------+---------------+---------+-----------+----------+--------------+  PERO      Full                                                             +---------+---------------+---------+-----------+----------+--------------+   +---------+---------------+---------+-----------+----------+--------------+  LEFT      Compressibility Phasicity Spontaneity Properties Thrombus Aging  +---------+---------------+---------+-----------+----------+--------------+  CFV       Full            Yes       Yes                                    +---------+---------------+---------+-----------+----------+--------------+  SFJ       Full                                                             +---------+---------------+---------+-----------+----------+--------------+  FV Prox   Full                                                             +---------+---------------+---------+-----------+----------+--------------+  FV Mid    Full                                                             +---------+---------------+---------+-----------+----------+--------------+  FV Distal Full                                                              +---------+---------------+---------+-----------+----------+--------------+  PFV       Full                                                             +---------+---------------+---------+-----------+----------+--------------+  POP       Full            Yes       Yes                                    +---------+---------------+---------+-----------+----------+--------------+  PTV       Full                                                             +---------+---------------+---------+-----------+----------+--------------+  PERO      Full                                                             +---------+---------------+---------+-----------+----------+--------------+     Summary: BILATERAL: -No evidence of popliteal cyst, bilaterally. RIGHT: - There is no evidence of deep vein thrombosis in the lower extremity.  LEFT: - There is no evidence of deep vein thrombosis in the lower extremity.  *See table(s) above for measurements and observations. Electronically signed by Monica Martinez MD on 07/18/2021 at 1:52:16 PM.    Final    US Abdomen Limited RUQ (LIVER/GB)  Result Date: 07/18/2021 CLINICAL DATA:  Cirrhosis EXAM: ULTRASOUND ABDOMEN LIMITED RIGHT UPPER QUADRANT COMPARISON:  None. FINDINGS: Gallbladder: 3 mm nonshadowing echogenic focus projecting into the lumen may represent a polyp. Trace amount of sludge. Negative sonographic Murphy sign. No significant wall thickening or pericholecystic edema. Common bile duct: Diameter: 2 mm Liver: Subtle nodularity of the contour. No suspicious mass identified. Portal vein is patent on color Doppler imaging with normal direction of blood flow towards the liver. Other: None. IMPRESSION: 1. Subtle nodularity of the liver contour suggesting cirrhosis. No hepatic mass identified. 2. 3 mm echogenic focus in the gallbladder which may represent a polyp. Electronically Signed   By: Ofilia Neas M.D.   On:  07/18/2021 09:30    Microbiology: Recent Results (from the  past 240 hour(s))  Resp Panel by RT-PCR (Flu A&B, Covid) Nasopharyngeal Swab     Status: None   Collection Time: 07/17/21 10:08 PM   Specimen: Nasopharyngeal Swab; Nasopharyngeal(NP) swabs in vial transport medium  Result Value Ref Range Status   SARS Coronavirus 2 by RT PCR NEGATIVE NEGATIVE Final    Comment: (NOTE) SARS-CoV-2 target nucleic acids are NOT DETECTED.  The SARS-CoV-2 RNA is generally detectable in upper respiratory specimens during the acute phase of infection. The lowest concentration of SARS-CoV-2 viral copies this assay can detect is 138 copies/mL. A negative result does not preclude SARS-Cov-2 infection and should not be used as the sole basis for treatment or other patient management decisions. A negative result may occur with  improper specimen collection/handling, submission of specimen other than nasopharyngeal swab, presence of viral mutation(s) within the areas targeted by this assay, and inadequate number of viral copies(<138 copies/mL). A negative result must be combined with clinical observations, patient history, and epidemiological information. The expected result is Negative.  Fact Sheet for Patients:  EntrepreneurPulse.com.au  Fact Sheet for Healthcare Providers:  IncredibleEmployment.be  This test is no t yet approved or cleared by the [place] FDA and  has been authorized for detection and/or diagnosis of SARS-CoV-2 by FDA under an Emergency Use Authorization (EUA). This EUA will remain  in effect (meaning this test can be used) for the duration of the COVID-19 declaration under Section 564(b)(1) of the Act, 21 U.S.C.section 360bbb-3(b)(1), unless the authorization is terminated  or revoked sooner.       Influenza A by PCR NEGATIVE NEGATIVE Final   Influenza B by PCR NEGATIVE NEGATIVE Final    Comment: (NOTE) The Xpert Xpress SARS-CoV-2/FLU/RSV plus assay is intended as an aid in the diagnosis of  influenza from Nasopharyngeal swab specimens and should not be used as a sole basis for treatment. Nasal washings and aspirates are unacceptable for Xpert Xpress SARS-CoV-2/FLU/RSV testing.  Fact Sheet for Patients: EntrepreneurPulse.com.au  Fact Sheet for Healthcare Providers: IncredibleEmployment.be  This test is not yet approved or cleared by the [place] FDA and has been authorized for detection and/or diagnosis of SARS-CoV-2 by FDA under an Emergency Use Authorization (EUA). This EUA will remain in effect (meaning this test can be used) for the duration of the COVID-19 declaration under Section 564(b)(1) of the Act, 21 U.S.C. section 360bbb-3(b)(1), unless the authorization is terminated or revoked.  Performed at [facility], [place] [address], [place], [place] [address]   Culture, blood (routine x 2)     Status: None   Collection Time: 07/18/21  3:42 AM   Specimen: BLOOD  Result Value Ref Range Status   Specimen Description BLOOD SITE NOT SPECIFIED  Final   Special Requests   Final    BOTTLES DRAWN AEROBIC AND ANAEROBIC Blood Culture adequate volume   Culture   Final    NO GROWTH 5 DAYS Performed at [facility], [address] [place], [place] [address]    Report Status 07/23/2021 FINAL  Final  Culture, blood (routine x 2)     Status: None   Collection Time: 07/18/21  5:03 AM   Specimen: BLOOD RIGHT HAND  Result Value Ref Range Status   Specimen Description BLOOD RIGHT HAND  Final   Special Requests   Final    BOTTLES DRAWN AEROBIC AND ANAEROBIC Blood Culture adequate volume   Culture   Final  NO GROWTH 5 DAYS Performed at [facility], [place] [address] [place], [place] [address]    Report Status 07/23/2021 FINAL  Final     Labs: Basic Metabolic Panel: Recent Labs  Lab 07/19/21 0130 07/20/21 0542 07/21/21 0227 07/22/21 0126 07/22/21 1523 07/23/21 0307  NA 146* 141 141 137 141 140  K 3.8 3.1*  4.7 5.2* 4.6 4.8  CL 103 100 101 98 101 101  CO2 29 30 28 30 29 26   GLUCOSE 101* 127* 114* 125* 129* 118*  BUN 27* 37* 30* 36* 37* 42*  CREATININE 1.24* 1.24* 1.28* 1.30* 1.58* 1.53*  CALCIUM 9.0 8.6* 8.7* 8.6* 8.9 8.7*  MG 2.4  --   --   --   --   --    Liver Function Tests: Recent Labs  Lab 07/18/21 0116 07/19/21 0130  AST 52* 56*  ALT 63* 66*  ALKPHOS 122 132*  BILITOT 1.0 0.8  PROT 6.1* 6.8  ALBUMIN 2.4* 2.6*   No results for input(s): LIPASE, AMYLASE in the last 168 hours. No results for input(s): AMMONIA in the last 168 hours. CBC: Recent Labs  Lab 07/17/21 2206 07/17/21 2349 07/19/21 0130 07/20/21 0542 07/21/21 0227 07/22/21 0126  WBC 14.2*  --  16.5* 13.1* 10.8* 10.1  NEUTROABS 12.0*  --  14.1*  --   --   --   HGB 13.6 13.9 14.9 13.5 14.7 14.9  HCT 39.9 41.0 44.7 40.8 45.9 46.7*  MCV 84.4  --  84.3 84.6 85.0 85.7  PLT 273  --  242 305 366 373   Cardiac Enzymes: No results for input(s): CKTOTAL, CKMB, CKMBINDEX, TROPONINI in the last 168 hours. BNP: BNP (last 3 results) Recent Labs    07/17/21 2206 07/20/21 0542  BNP 2,729.9* 509.0*    ProBNP (last 3 results) No results for input(s): PROBNP in the last 8760 hours.  CBG: Recent Labs  Lab 07/20/21 0735  GLUCAP 129*       Signed:  Domenic Polite MD.  [facility] 07/23/2021, 11:19 AM

## 2021-07-26 NOTE — Progress Notes (Deleted)
Primary Physician/Referring:  Javier Docker, MD  Patient ID: Valerie Rodgers, female    DOB: 1950-12-12, 71 y.o.   MRN: [id number]  No chief complaint on file.  HPI:    Valerie Rodgers  is a 71 y.o. hypertension, COPD, bipolar disorder, recent cocaine abuse, admitted with acute hypoxic respiratory failure, congestive heart failure, cocaine positive on admission 07/17/2021.   Patient was admitted to [facility] 07/18/2021-07/23/2021 with acute on chronic heart failure.  Patient was diuresed and started on Entresto, spironolactone, Corlanor, and Lasix.  Echocardiogram revealed LVEF 25-30% and grade 3 diastolic dysfunction.  Patient now presents for outpatient care and follow-up. ***  ***  Past Medical History:  Diagnosis Date   Arthritis    Bipolar affective disorder ([place])    Cataract    Past Surgical History:  Procedure Laterality Date   ABDOMINAL HYSTERECTOMY     Family History  Problem Relation Age of Onset   Colon cancer Neg Hx    Heart disease Neg Hx     Social History   Tobacco Use   Smoking status: Some Days   Smokeless tobacco: Never  Substance Use Topics   Alcohol use: Yes    Alcohol/week: 0.0 standard drinks    Comment: very seldom   Marital Status: Single   ROS  ***ROS  Objective  There were no vitals taken for this visit.  Vitals with BMI 07/23/2021 07/23/2021 07/23/2021  Height - - -  Weight - - -  BMI - - -  Systolic [id number]  Diastolic 49 94 85  Pulse 87 61 -      ***Physical Exam  Laboratory examination:   Recent Labs    07/22/21 0126 07/22/21 1523 07/23/21 0307  NA 137 141 140  K 5.2* 4.6 4.8  CL 98 101 101  CO2 30 29 26   GLUCOSE 125* 129* 118*  BUN 36* 37* 42*  CREATININE 1.30* 1.58* 1.53*  CALCIUM 8.6* 8.9 8.7*  GFRNONAA 44* 35* 36*   estimated creatinine clearance is 33 mL/min (A) (by C-G formula based on SCr of 1.53 mg/dL (H)).  CMP Latest Ref Rng & Units 07/23/2021 07/22/2021 07/22/2021  Glucose 70 - 99 mg/dL 118(H)  129(H) 125(H)  BUN 8 - 23 mg/dL 42(H) 37(H) 36(H)  Creatinine 0.44 - 1.00 mg/dL 1.53(H) 1.58(H) 1.30(H)  Sodium 135 - 145 mmol/L 140 141 137  Potassium 3.5 - 5.1 mmol/L 4.8 4.6 5.2(H)  Chloride 98 - 111 mmol/L 101 101 98  CO2 22 - 32 mmol/L 26 29 30   Calcium 8.9 - 10.3 mg/dL 8.7(L) 8.9 8.6(L)  Total Protein 6.5 - 8.1 g/dL - - -  Total Bilirubin 0.3 - 1.2 mg/dL - - -  Alkaline Phos 38 - 126 U/L - - -  AST 15 - 41 U/L - - -  ALT 0 - 44 U/L - - -   CBC Latest Ref Rng & Units 07/22/2021 07/21/2021 07/20/2021  WBC 4.0 - 10.5 K/uL 10.1 10.8(H) 13.1(H)  Hemoglobin 12.0 - 15.0 g/dL 14.9 14.7 13.5  Hematocrit 36.0 - 46.0 % 46.7(H) 45.9 40.8  Platelets 150 - 400 K/uL 373 366 305    Lipid Panel No results for input(s): CHOL, TRIG, LDLCALC, VLDL, HDL, CHOLHDL, LDLDIRECT in the last 8760 hours.  HEMOGLOBIN A1C No results found for: HGBA1C, MPG TSH No results for input(s): TSH in the last 8760 hours.  External labs:   None   Allergies  No Known Allergies    Medications  Prior to Visit:   Outpatient Medications Prior to Visit  Medication Sig Dispense Refill   acetaminophen (TYLENOL) 500 MG tablet Take 1,000 mg by mouth every 6 (six) hours as needed (pain).     albuterol (PROVENTIL HFA;VENTOLIN HFA) 108 (90 Base) MCG/ACT inhaler 2 puffs every 6 (six) hours as needed for wheezing or shortness of breath. (Patient not taking: Reported on 07/18/2021)     furosemide (LASIX) 40 MG tablet Take 1 tablet (40 mg total) by mouth daily. 30 tablet 0   ivabradine (CORLANOR) 5 MG TABS tablet Take 1 tablet (5 mg total) by mouth 2 (two) times daily with a meal. 60 tablet 0   Multiple Vitamin (MULTIVITAMIN WITH MINERALS) TABS tablet Take 1 tablet by mouth daily.     sacubitril-valsartan (ENTRESTO) 24-26 MG Take 1 tablet by mouth 2 (two) times daily. 60 tablet 0   SYMBICORT 160-4.5 MCG/ACT inhaler Inhale 2 puffs into the lungs daily.     No facility-administered medications prior to visit.     Final  Medications at End of Visit    No outpatient medications have been marked as taking for the 07/28/21 encounter (Appointment) with Carlynn Purl, Khyre Germond C, PA-C.     Radiology:   No results found.  Cardiac Studies:   Nuclear stress test 07/21/2021: 1. Negative for ischemia. Fixed small anterior and moderate inferolateral defects: attenuation versus scar. 2. Global hypokinesis with marked LVE. 3. Left ventricular ejection fraction 21% 4. Non invasive risk stratification*: High   Echocardiogram 07/18/2021:  1. Left ventricular ejection fraction, by estimation, is 25 to 30%. The  left ventricle has severely decreased function. The left ventricle  demonstrates global hypokinesis. Left ventricular diastolic parameters are  consistent with Grade III diastolic dysfunction (restrictive).   2. Right ventricular systolic function is normal. The right ventricular  size is normal.   3. Left atrial size was severely dilated.   4. The mitral valve is normal in structure. Trivial mitral valve  regurgitation. No evidence of mitral stenosis.   5. The aortic valve is normal in structure. Aortic valve regurgitation is  not visualized. No aortic stenosis is present.   6. The inferior vena cava is normal in size with greater than 50%  respiratory variability, suggesting right atrial pressure of 3 mmHg.  Personally reviewed. LV appears mildly dilated.   EKG:  ***   EKG 07/17/2021: Sinus rhythm 92 bpm Frequent PVC  Assessment  No diagnosis found.   There are no discontinued medications.  No orders of the defined types were placed in this encounter.   Recommendations:   Valerie Rodgers is a 71 y.o. hypertension, COPD, bipolar disorder, recent cocaine abuse, admitted with acute hypoxic respiratory failure, congestive heart failure, cocaine positive on admission 07/17/2021.   Chronic HFrEF:  ***  Chronic hypoxic respiratory failure:*** Secondary to new diagnosis, likely acute on chronic systolic  and diastolic heart failure. EF 25-30%, as well as possible COPD exacerbation-managed by the primary team. Acute decompensation is now resolved. Differentials include cocaine or ?alcohol induced nonischemic cardiomyopathy or less likely, ischemic cardiomyopathy Stress test with moderate inferolateral and small anterior fixed perfusion defect, no ischemia.  Global hypokinesis with LVEF 21%. Suspicion for obstructive CAD remains lower in absence of ischemia and angina.  If EF does not improve with guideline directed medical therapy, will consider coronary angiography outpatient. Continue Entresto 24-26 mg bid, corlanor 5 mg bid, spironolactone 12.5 mg daily, can reduce Lasix to as needed on discharge Unable to use [place] due to  liver dysfunction, avoiding beta blockers due to recent cocaine use. Will consider outpatient. Counseled the patient regarding need for strict abstinence from cocaine multiple times during this hospitalization.  That said, it was reported by the nursing staff the patient unequivocally stated that she will use cocaine again.  Unfortunately, this portends a poor prognosis for the patient.  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  I do not think this is ventricular tachycardia. Continue guideline directed medical therapy for heart failure.   Abnormal LFT: Cirrhosis noted on liver US. She denies significant alcohol use.  Differentials include congestive hepatopathy versus primary liver pathology. Recommend spironolactone and Lasix as above. Defer workup to primary team  Patient was seen in collaboration with Dr. Marland Kitchen He also reviewed patient's chart and examined the patient. Dr. Marland Kitchen is in agreement of the plan.   During this visit I reviewed and updated: Tobacco history   allergies  medication reconciliation   medical history   surgical history   family history   social history.  This  note was created using a voice recognition software as a result there may be grammatical errors inadvertently enclosed that do not reflect the nature of this encounter. Every attempt is made to correct such errors.   Alethia Berthold, PA-C 07/26/2021, 11:13 AM Office: [phone]

## 2021-07-28 ENCOUNTER — Ambulatory Visit: Payer: Medicare Other | Admitting: Student

## 2021-07-28 DIAGNOSIS — I10 Essential (primary) hypertension: Secondary | ICD-10-CM

## 2021-07-28 DIAGNOSIS — I5042 Chronic combined systolic (congestive) and diastolic (congestive) heart failure: Secondary | ICD-10-CM

## 2021-08-02 NOTE — Progress Notes (Signed)
Primary Physician/Referring:  Gilda Crease, MD  Patient ID: Valerie Rodgers, female    DOB: 1951/02/17, 71 y.o.   MRN: [id number]  Chief Complaint  Patient presents with   New Patient (Initial Visit)   Hospitalization Follow-up   HFrEF   HPI:    Valerie Rodgers  is a 71 y.o. hypertension, COPD, bipolar disorder, recent cocaine abuse, admitted with acute hypoxic respiratory failure, congestive heart failure, cocaine positive on admission 07/17/2021.   Patient was admitted to [facility] 07/18/2021-07/23/2021 with acute on chronic heart failure.  Patient was diuresed and started on Entresto, Corlanor, and Lasix.  Echocardiogram revealed LVEF 25-30% and grade 3 diastolic dysfunction.  Patient now presents for outpatient care and follow-up.   Patient states she is feeling well overall since discharge, denies dyspnea or swelling.  Patient brings with her all of the medication she is taking.  Upon review patient has continued to take hydrochlorothiazide and amlodipine despite these being discontinued at discharge and she is not currently taking Corlanor.  Patient has not followed up with GI regarding abnormalities of the liver on ultrasound.  Patient is in the process of establishing with primary care, plans to make an appointment with [facility] upcoming.   Patient admits to continued cocaine use, most recently used yesterday.  Past Medical History:  Diagnosis Date   Arthritis    Bipolar affective disorder (HCC)    Cataract    Past Surgical History:  Procedure Laterality Date   ABDOMINAL HYSTERECTOMY     Family History  Problem Relation Age of Onset   Stroke Father    Colon cancer Neg Hx    Heart disease Neg Hx     Social History   Tobacco Use   Smoking status: Some Days    Packs/day: 0.25    Years: 65.00    Pack years: 16.25    Types: Cigarettes   Smokeless tobacco: Never  Substance Use Topics   Alcohol use: Yes    Alcohol/week: 0.0 standard drinks    Comment:  very seldom   Marital Status: Single   ROS  Review of Systems  Cardiovascular:  Negative for chest pain, claudication, leg swelling, near-syncope, orthopnea, palpitations, paroxysmal nocturnal dyspnea and syncope.  Respiratory:  Negative for shortness of breath.   Neurological:  Negative for dizziness.   Objective  Blood pressure 110/78, pulse (!) 58, temperature 98 F (36.7 C), temperature source Temporal, resp. rate 17, height 5\' 4"  (1.626 m), weight 171 lb (77.6 kg), SpO2 97 %.  Vitals with BMI 08/04/2021 07/23/2021 07/23/2021  Height 5\' 4"  - -  Weight 171 lbs - -  BMI 29.34 - -  Systolic 110 113 294  Diastolic 78 49 94  Pulse 58 87 61    Physical Exam Vitals reviewed.  Constitutional:      Appearance: She is obese.  Cardiovascular:     Rate and Rhythm: Normal rate and regular rhythm.     Pulses: Intact distal pulses.     Heart sounds: S1 normal and S2 normal. No murmur heard.   No gallop.  Pulmonary:     Effort: Pulmonary effort is normal. No respiratory distress.     Breath sounds: No wheezing, rhonchi or rales.  Musculoskeletal:     Right lower leg: Edema (minimal) present.     Left lower leg: Edema (minimal) present.  Neurological:     Mental Status: She is alert.   Laboratory examination:   estimated creatinine clearance is 34  mL/min (A) (by C-G formula based on SCr of 1.53 mg/dL (H)).  CMP Latest Ref Rng & Units 07/23/2021 07/22/2021 07/22/2021  Glucose 70 - 99 mg/dL 022(V) 361(Q) 244(L)  BUN 8 - 23 mg/dL 75(P) 00(F) 11(M)  Creatinine 0.44 - 1.00 mg/dL 2.11(Z) 7.35(A) 7.01(I)  Sodium 135 - 145 mmol/L 140 141 137  Potassium 3.5 - 5.1 mmol/L 4.8 4.6 5.2(H)  Chloride 98 - 111 mmol/L 101 101 98  CO2 22 - 32 mmol/L 26 29 30   Calcium 8.9 - 10.3 mg/dL 1.0(V) 8.9 0.1(T)  Total Protein 6.5 - 8.1 g/dL - - -  Total Bilirubin 0.3 - 1.2 mg/dL - - -  Alkaline Phos 38 - 126 U/L - - -  AST 15 - 41 U/L - - -  ALT 0 - 44 U/L - - -   CBC Latest Ref Rng & Units 07/22/2021 07/21/2021  07/20/2021  WBC 4.0 - 10.5 K/uL 10.1 10.8(H) 13.1(H)  Hemoglobin 12.0 - 15.0 g/dL 14.3 88.8 75.7  Hematocrit 36.0 - 46.0 % 46.7(H) 45.9 40.8  Platelets 150 - 400 K/uL 373 366 305    Lipid Panel No results for input(s): CHOL, TRIG, LDLCALC, VLDL, HDL, CHOLHDL, LDLDIRECT in the last 8760 hours.  HEMOGLOBIN A1C No results found for: HGBA1C, MPG TSH No results for input(s): TSH in the last 8760 hours.  External labs:   None   Allergies  No Known Allergies   Medications Prior to Visit:   Outpatient Medications Prior to Visit  Medication Sig Dispense Refill   acetaminophen (TYLENOL) 500 MG tablet Take 1,000 mg by mouth every 6 (six) hours as needed (pain).     fluticasone furoate-vilanterol (BREO ELLIPTA) 200-25 MCG/ACT AEPB Inhale 1 puff into the lungs daily.     furosemide (LASIX) 40 MG tablet Take 1 tablet (40 mg total) by mouth daily. 30 tablet 0   Multiple Vitamin (MULTIVITAMIN WITH MINERALS) TABS tablet Take 1 tablet by mouth daily.     amLODipine (NORVASC) 5 MG tablet Take 5 mg by mouth daily.     hydrochlorothiazide (HYDRODIURIL) 12.5 MG tablet Take 12.5 mg by mouth daily.     predniSONE (DELTASONE) 20 MG tablet Take 20 mg by mouth daily with breakfast.     sacubitril-valsartan (ENTRESTO) 24-26 MG Take 1 tablet by mouth 2 (two) times daily. 60 tablet 0   SYMBICORT 160-4.5 MCG/ACT inhaler Inhale 2 puffs into the lungs daily.     albuterol (PROVENTIL HFA;VENTOLIN HFA) 108 (90 Base) MCG/ACT inhaler 2 puffs every 6 (six) hours as needed for wheezing or shortness of breath.     ivabradine (CORLANOR) 5 MG TABS tablet Take 1 tablet (5 mg total) by mouth 2 (two) times daily with a meal. 60 tablet 0   No facility-administered medications prior to visit.   Final Medications at End of Visit    Current Meds  Medication Sig   acetaminophen (TYLENOL) 500 MG tablet Take 1,000 mg by mouth every 6 (six) hours as needed (pain).   fluticasone furoate-vilanterol (BREO ELLIPTA) 200-25 MCG/ACT  AEPB Inhale 1 puff into the lungs daily.   furosemide (LASIX) 40 MG tablet Take 1 tablet (40 mg total) by mouth daily.   ivabradine (CORLANOR) 5 MG TABS tablet Take 1 tablet (5 mg total) by mouth 2 (two) times daily with a meal.   Multiple Vitamin (MULTIVITAMIN WITH MINERALS) TABS tablet Take 1 tablet by mouth daily.   [DISCONTINUED] amLODipine (NORVASC) 5 MG tablet Take 5 mg by mouth daily.   [  DISCONTINUED] hydrochlorothiazide (HYDRODIURIL) 12.5 MG tablet Take 12.5 mg by mouth daily.   [DISCONTINUED] predniSONE (DELTASONE) 20 MG tablet Take 20 mg by mouth daily with breakfast.   [DISCONTINUED] sacubitril-valsartan (ENTRESTO) 24-26 MG Take 1 tablet by mouth 2 (two) times daily.   [DISCONTINUED] SYMBICORT 160-4.5 MCG/ACT inhaler Inhale 2 puffs into the lungs daily.   Radiology:   No results found.  Cardiac Studies:   Nuclear stress test 07/21/2021: 1. Negative for ischemia. Fixed small anterior and moderate inferolateral defects: attenuation versus scar. 2. Global hypokinesis with marked LVE. 3. Left ventricular ejection fraction 21% 4. Non invasive risk stratification*: High   Echocardiogram 07/18/2021:  1. Left ventricular ejection fraction, by estimation, is 25 to 30%. The  left ventricle has severely decreased function. The left ventricle  demonstrates global hypokinesis. Left ventricular diastolic parameters are  consistent with Grade III diastolic dysfunction (restrictive).   2. Right ventricular systolic function is normal. The right ventricular  size is normal.   3. Left atrial size was severely dilated.   4. The mitral valve is normal in structure. Trivial mitral valve  regurgitation. No evidence of mitral stenosis.   5. The aortic valve is normal in structure. Aortic valve regurgitation is  not visualized. No aortic stenosis is present.   6. The inferior vena cava is normal in size with greater than 50%  respiratory variability, suggesting right atrial pressure of 3 mmHg.   Personally reviewed. LV appears mildly dilated.   EKG:  EKG 08/04/2021: Wandering atrial pacemaker with ventricular rate of 93 bpm.  EKG 07/17/2021: Sinus rhythm 92 bpm Frequent PVC  Assessment     ICD-10-CM   1. Chronic combined systolic and diastolic heart failure (HCC)  I50.42 EKG XX123456    Basic metabolic panel    Pro b natriuretic peptide (BNP)[id number])    2. Chronic respiratory failure, unspecified whether with hypoxia or hypercapnia ([place])  J96.10     3. 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 Essential hypertension  I10     5. Other cirrhosis of liver ([place])  K74.69 Ambulatory referral to Gastroenterology    6. Abnormal LFTs  R79.89        Medications Discontinued During This Encounter  Medication Reason   albuterol (PROVENTIL HFA;VENTOLIN HFA) 108 (90 Base) MCG/ACT inhaler    ivabradine (CORLANOR) 5 MG TABS tablet    predniSONE (DELTASONE) 20 MG tablet Completed Course   hydrochlorothiazide (HYDRODIURIL) 12.5 MG tablet Discontinued by provider   amLODipine (NORVASC) 5 MG tablet Discontinued by provider   SYMBICORT 160-4.5 MCG/ACT inhaler Patient has not taken in last 30 days   sacubitril-valsartan (ENTRESTO) 24-26 MG Reorder    Meds ordered this encounter  Medications   ivabradine (CORLANOR) 5 MG TABS tablet    Sig: Take 1 tablet (5 mg total) by mouth 2 (two) times daily with a meal.    Dispense:  60 tablet    Refill:  3   sacubitril-valsartan (ENTRESTO) 24-26 MG    Sig: Take 1 tablet by mouth 2 (two) times daily.    Dispense:  180 tablet    Refill:  3    Recommendations:   Valerie Rodgers is a 71 y.o. hypertension, COPD, bipolar disorder, recent cocaine abuse, admitted with acute hypoxic respiratory failure, congestive heart failure, cocaine positive on admission 07/17/2021.   Chronic combined systolic and diastolic heart failure:  Completed thorough medication reconciliation.  We will continue Entresto and  Lasix.  Will  restart Corlanor 5 mg twice daily given heart rate >70 bpm on EKG. We will obtain repeat BMP and plan to add spironolactone if hemodynamics and renal function allow.  Notably today patient's blood pressure is low, will therefore stop hydrochlorothiazide and amlodipine as previously directed at the hospital. We will enroll patient in remote patient monitoring with our office. There is no clinical evidence of acute heart failure at today's office visit. Gust at length with patient the importance of complete cessation of cocaine use as well as diet and lifestyle modifications including low-sodium diet.  Patient verbalized understanding, however expresses that all of this will be quite difficult for her. We will continue to avoid initiation of beta-blocker therapy given continued cocaine use.  Chronic hypoxic respiratory failure: Secondary to new diagnosis, likely acute on chronic systolic and diastolic heart failure. EF 25-30%, as well as possible COPD exacerbation-managed by the primary team. Again educated patient regarding the importance of strict abstinence from cocaine, however again patient admits this will be difficult for her. Also encourage patient to establish care with PCP.  Patient may benefit from pulmonary evaluation at some point.    Wandering atrial pacemaker: Reviewed EKG with Dr. Einar Gip, who is in agreement.  This is likely due to underlying COPD.  We will add Corlanor for improved heart rate, otherwise no changes at this time.  Hypertension:  Blood pressure is soft, will stop HCTZ and amlodipine.  Continue Entresto and Lasix. We will continue to monitor and consider addition of spironolactone as hemodynamics and renal function allow.  Abnormal LFT: Cirrhosis noted on liver US. She denies significant alcohol use.  Differentials include congestive hepatopathy versus primary liver pathology. Patient was advised to follow-up with GI at discharge, however this has not  happened.  We will therefore refer patient to gastroenterology for further evaluation and management.  Discussed at length with patient regarding the importance of diet and lifestyle modifications including complete cessation of illicit drug use.  Patient is at high risk for hospitalizations given drug use as well as difficulty with medication compliance.   Follow up in 2 weeks, sooner if needed.    Alethia Berthold, PA-C 08/04/2021, 1:41 PM Office: [phone]

## 2021-08-04 ENCOUNTER — Ambulatory Visit: Payer: Medicare Other | Admitting: Student

## 2021-08-04 ENCOUNTER — Other Ambulatory Visit: Payer: Self-pay

## 2021-08-04 ENCOUNTER — Encounter: Payer: Self-pay | Admitting: Student

## 2021-08-04 VITALS — BP 110/78 | HR 58 | Temp 98.0°F | Resp 17 | Ht 64.0 in | Wt 171.0 lb

## 2021-08-04 DIAGNOSIS — I10 Essential (primary) hypertension: Secondary | ICD-10-CM

## 2021-08-04 DIAGNOSIS — K7469 Other cirrhosis of liver: Secondary | ICD-10-CM

## 2021-08-04 DIAGNOSIS — R7989 Other specified abnormal findings of blood chemistry: Secondary | ICD-10-CM

## 2021-08-04 DIAGNOSIS — J9610 Chronic respiratory failure, unspecified whether with hypoxia or hypercapnia: Secondary | ICD-10-CM

## 2021-08-04 DIAGNOSIS — I5042 Chronic combined systolic (congestive) and diastolic (congestive) heart failure: Secondary | ICD-10-CM

## 2021-08-04 DIAGNOSIS — I498 Other specified cardiac arrhythmias: Secondary | ICD-10-CM

## 2021-08-04 DIAGNOSIS — I5020 Unspecified systolic (congestive) heart failure: Secondary | ICD-10-CM

## 2021-08-04 MED ORDER — IVABRADINE HCL 5 MG PO TABS: 5.0000 mg | ORAL_TABLET | Freq: Two times a day (BID) | ORAL | 3 refills | Status: DC

## 2021-08-04 MED ORDER — SACUBITRIL-VALSARTAN 24-26 MG PO TABS: 1.0000 | ORAL_TABLET | Freq: Two times a day (BID) | ORAL | 3 refills | Status: DC

## 2021-08-10 ENCOUNTER — Ambulatory Visit (INDEPENDENT_AMBULATORY_CARE_PROVIDER_SITE_OTHER): Payer: Medicare Other | Admitting: Gastroenterology

## 2021-08-10 ENCOUNTER — Other Ambulatory Visit (INDEPENDENT_AMBULATORY_CARE_PROVIDER_SITE_OTHER): Payer: Medicare Other

## 2021-08-10 ENCOUNTER — Encounter: Payer: Self-pay | Admitting: Gastroenterology

## 2021-08-10 VITALS — BP 120/68 | HR 63 | Ht 64.0 in | Wt 160.0 lb

## 2021-08-10 DIAGNOSIS — R7989 Other specified abnormal findings of blood chemistry: Secondary | ICD-10-CM

## 2021-08-10 DIAGNOSIS — K7460 Unspecified cirrhosis of liver: Secondary | ICD-10-CM

## 2021-08-10 LAB — IBC + FERRITIN
Ferritin: 59.4 ng/mL (ref 10.0–291.0)
Iron: 49 ug/dL (ref 42–145)
Saturation Ratios: 12.5 % — ABNORMAL LOW (ref 20.0–50.0)
TIBC: 393.4 ug/dL (ref 250.0–450.0)
Transferrin: 281 mg/dL (ref 212.0–360.0)

## 2021-08-10 LAB — COMPREHENSIVE METABOLIC PANEL
ALT: 16 U/L (ref 0–35)
AST: 17 U/L (ref 0–37)
Albumin: 4.2 g/dL (ref 3.5–5.2)
Alkaline Phosphatase: 118 U/L — ABNORMAL HIGH (ref 39–117)
BUN: 29 mg/dL — ABNORMAL HIGH (ref 6–23)
CO2: 36 mEq/L — ABNORMAL HIGH (ref 19–32)
Calcium: 9.5 mg/dL (ref 8.4–10.5)
Chloride: 104 mEq/L (ref 96–112)
Creatinine, Ser: 1.21 mg/dL — ABNORMAL HIGH (ref 0.40–1.20)
GFR: 44.95 mL/min — ABNORMAL LOW (ref >60.00)
Glucose, Bld: 61 mg/dL — ABNORMAL LOW (ref 70–99)
Potassium: 3.6 mEq/L (ref 3.5–5.1)
Sodium: 144 mEq/L (ref 135–145)
Total Bilirubin: 0.4 mg/dL (ref 0.2–1.2)
Total Protein: 7.3 g/dL (ref 6.0–8.3)

## 2021-08-10 LAB — B12 AND FOLATE PANEL
Folate: 16.7 ng/mL (ref >5.9)
Vitamin B-12: 261 pg/mL (ref 211–911)

## 2021-08-10 NOTE — Progress Notes (Signed)
Addendum: Reviewed and agree with assessment and management plan. Agree with serologic evaluation for further evaluation of cirrhosis.  It is paramount that she stop substance abuse including cocaine and alcohol. Would recommend that she follow-up with [place] in 8 to 12 weeks We can discuss endoscopic variceal screening however would like to prove she can discontinue alcohol and illicit drugs prior to proceeding. Lleyton Byers, Carie Caddy, MD

## 2021-08-10 NOTE — Patient Instructions (Signed)
Your provider has requested that you go to the basement level for lab work before leaving today. Press "B" on the elevator. The lab is located at the first door on the left as you exit the elevator.  _______________________________________________________  If you are age 72 or older, your body mass index should be between 23-30. Your Body mass index is 27.46 kg/m. If this is out of the aforementioned range listed, please consider follow up with your Primary Care Provider.  If you are age 64 or younger, your body mass index should be between 19-25. Your Body mass index is 27.46 kg/m. If this is out of the aformentioned range listed, please consider follow up with your Primary Care Provider.   ________________________________________________________  The [facility] providers would like to encourage you to use MYCHART to communicate with providers for non-urgent requests or questions.  Due to long hold times on the telephone, sending your provider a message by MYCHART may be a faster and more efficient way to get a response.  Please allow 48 business hours for a response.  Please remember that this is for non-urgent requests.  _______________________________________________________  

## 2021-08-10 NOTE — Progress Notes (Signed)
08/10/2021 Valerie Rodgers [id number] 07-15-1950   HISTORY OF PRESENT ILLNESS: This is a 71 year old female who is known to Dr. Hilarie Fredrickson only for a colonoscopy in July 2017.  Only had diverticulosis at that time with a 10-year repeat recall recommended.  She is here today at the request of Lawerance Cruel, PA-C, for evaluation regarding findings of cirrhosis.  She has some mildly elevated LFTs with an AST of 56, ALT of 66, alk phos 132, total bili normal at 0.8.  Ultrasound showed subtle nodularity of the liver contour suggesting cirrhosis with no mass identified.  She tells me that she does drink a pint of alcohol a week.  Says that she has never been a bigger drinker than that.  She also admits to cocaine use.  When questioned on how much cocaine she uses, she says "not often; I did use some last night, but just a little bit".  She says that she is trying to stop using cocaine.  She was having some lower extremity swelling, but is on Lasix and says that that does help.  She says that she also tries to watch her sodium intake.  From a GI standpoint she really has no complaints.  She says that she moves her bowels regularly.  Does not see any blood in her stool.  She denies any family history of liver disease.  Her platelet count is normal at 373.  She tells me that she just had oxygen prescribed for her to use at home, but has not started using it yet as she does not know how to set it up; there is supposed to be someone coming out to help her learn how to use it, etc.   Past Medical History:  Diagnosis Date   Arthritis    Bipolar affective disorder ([place])    Cataract    Past Surgical History:  Procedure Laterality Date   ABDOMINAL HYSTERECTOMY      reports that she has been smoking cigarettes. She has a 16.25 pack-year smoking history. She has never used smokeless tobacco. She reports current alcohol use. She reports current drug use. Drug: Cocaine. family history includes Stroke in her  father. No Known Allergies    Outpatient Encounter Medications as of 08/10/2021  Medication Sig   fluticasone furoate-vilanterol (BREO ELLIPTA) 200-25 MCG/ACT AEPB Inhale 1 puff into the lungs daily.   furosemide (LASIX) 40 MG tablet Take 1 tablet (40 mg total) by mouth daily.   ivabradine (CORLANOR) 5 MG TABS tablet Take 1 tablet (5 mg total) by mouth 2 (two) times daily with a meal.   Multiple Vitamin (MULTIVITAMIN WITH MINERALS) TABS tablet Take 1 tablet by mouth daily.   sacubitril-valsartan (ENTRESTO) 24-26 MG Take 1 tablet by mouth 2 (two) times daily.   [DISCONTINUED] acetaminophen (TYLENOL) 500 MG tablet Take 1,000 mg by mouth every 6 (six) hours as needed (pain).   No facility-administered encounter medications on file as of 08/10/2021.     REVIEW OF SYSTEMS  : All other systems reviewed and negative except where noted in the History of Present Illness.   PHYSICAL EXAM: BP 120/68    Pulse 63    Ht _0  (1.626 m)    Wt 160 lb (72.6 kg)    BMI 27.46 kg/m  General: Well developed AA female in no acute distress Head: Normocephalic and atraumatic Eyes:  Sclerae anicteric, conjunctiva pink. Ears: Normal auditory acuity Lungs: Clear throughout to auscultation; no W/R/R. Heart: Regular rate and rhythm;  no M/R/G. Abdomen: Soft, non-distended.  BS present.  Non-tender. Musculoskeletal: Symmetrical with no gross deformities  Skin: No lesions on visible extremities Extremities: No edema  Neurological: Alert oriented x 4, grossly non-focal Psychological:  Alert and cooperative. Normal mood and affect  ASSESSMENT AND PLAN: *71 year old female with new findings suggesting cirrhosis on ultrasound and mildly elevated LFTs.  She does drink a pint of liquor a week and uses cocaine.  She says that she has been trying to stop the cocaine use.  She denies any family history of liver disease.  We discussed completely discontinuing alcohol and cocaine.  We will perform extensive liver serologies  to rule out other concomitant causes of liver disease.  We will check a PT/INR.  Check AFP.  Platelets are normal.  We discussed a low-sodium diet.  She did have issues with lower extremity swelling, but is on Lasix for that which helps and she does report that she tries to watch her sodium intake as well.  Will likely need EGD at some point in the future for variceal screening, but she tells me she was just prescribed oxygen to use at home, but has not started using it yet; she is supposed to be having someone come to her house to help her set it up and educate her on it.  CC:  [place], Ralene Bathe, MD

## 2021-08-14 LAB — TISSUE TRANSGLUTAMINASE ABS,IGG,IGA
(tTG) Ab, IgA: <1 U/mL
(tTG) Ab, IgG: <1 U/mL

## 2021-08-14 LAB — MITOCHONDRIAL ANTIBODIES: Mitochondrial M2 Ab, IgG: <=20 U (ref <=20.0)

## 2021-08-14 LAB — AFP TUMOR MARKER: AFP-Tumor Marker: 2.5 ng/mL

## 2021-08-14 LAB — ALPHA-1-ANTITRYPSIN: A-1 Antitrypsin, Ser: 204 mg/dL — ABNORMAL HIGH (ref 83–199)

## 2021-08-14 LAB — HEPATITIS B SURFACE ANTIGEN: Hepatitis B Surface Ag: NONREACTIVE

## 2021-08-14 LAB — ANA: Anti Nuclear Antibody (ANA): NEGATIVE

## 2021-08-14 LAB — IGA: Immunoglobulin A: 152 mg/dL (ref 70–320)

## 2021-08-14 LAB — IGG: IgG (Immunoglobin G), Serum: 1003 mg/dL (ref 600–1540)

## 2021-08-14 LAB — CERULOPLASMIN: Ceruloplasmin: 38 mg/dL (ref 18–53)

## 2021-08-14 LAB — HEPATITIS B SURFACE ANTIBODY,QUALITATIVE: Hep B S Ab: NONREACTIVE

## 2021-08-14 LAB — ANTI-SMOOTH MUSCLE ANTIBODY, IGG: Actin (Smooth Muscle) Antibody (IGG): <20 U (ref <20)

## 2021-08-14 LAB — HEPATITIS A ANTIBODY, TOTAL: Hepatitis A AB,Total: REACTIVE — AB

## 2021-08-17 NOTE — Progress Notes (Signed)
Primary Physician/Referring:  Javier Docker, MD  Patient ID: Valerie Rodgers, female    DOB: 1950-06-30, 71 y.o.   MRN: [id number]  Chief Complaint  Patient presents with   HF    2 WEEKS   HPI:    Valerie Rodgers  is a 71 y.o. hypertension, COPD, bipolar disorder, recent cocaine abuse, admitted with acute hypoxic respiratory failure, congestive heart failure, cocaine positive on admission 07/17/2021.   Patient was admitted to [facility] 07/18/2021-07/23/2021 with acute on chronic heart failure.  Patient was diuresed and started on Entresto, Corlanor, and Lasix.  Echocardiogram revealed LVEF 25-30% and grade 3 diastolic dysfunction.    Patient was seen in our office 08/04/2021 for outpatient follow-up at which time she admitted to continued cocaine use.  She had also continued to take hydrochlorothiazide and amlodipine although these had been stopped at discharge.  She now presents for 2-week follow-up.  Repeat labs have shown stable renal function.  Patient is feeling well overall without chest pain, dyspnea, orthopnea, or leg edema.  However she does admit to continued cocaine use.  She was recently evaluated by GI who felt LFT elevations were due to alcohol and cocaine use, no evidence of cirrhosis.  Past Medical History:  Diagnosis Date   Arthritis    Bipolar affective disorder ([place])    Cataract    Past Surgical History:  Procedure Laterality Date   ABDOMINAL HYSTERECTOMY     Family History  Problem Relation Age of Onset   Stroke Father    Colon cancer Neg Hx    Heart disease Neg Hx     Social History   Tobacco Use   Smoking status: Some Days    Packs/day: 0.25    Years: 65.00    Pack years: 16.25    Types: Cigarettes   Smokeless tobacco: Never  Substance Use Topics   Alcohol use: Yes    Comment: 1 pint weekly   Marital Status: Single   ROS  Review of Systems  Cardiovascular:  Negative for chest pain, claudication, leg swelling, near-syncope,  orthopnea, palpitations, paroxysmal nocturnal dyspnea and syncope.  Respiratory:  Negative for shortness of breath.   Neurological:  Negative for dizziness.   Objective  Blood pressure 134/83, pulse (!) 58, temperature (!) 97.2 F (36.2 C), temperature source Temporal, resp. rate 17, height 5\' 4"  (1.626 m), weight 166 lb 6.4 oz (75.5 kg), SpO2 93 %.  Vitals with BMI 08/18/2021 08/10/2021 08/04/2021  Height 5\' 4"  5\' 4"  5\' 4"   Weight 166 lbs 6 oz 160 lbs 171 lbs  BMI 28.55 Q000111Q A999333  Systolic Q000111Q 123456 [id number]  Diastolic 83 68 78  Pulse 58 63 58    Physical Exam Vitals reviewed.  Constitutional:      Appearance: She is obese.  Cardiovascular:     Rate and Rhythm: Normal rate and regular rhythm.     Pulses: Intact distal pulses.     Heart sounds: S1 normal and S2 normal. No murmur heard.   No gallop.  Pulmonary:     Effort: Pulmonary effort is normal. No respiratory distress.     Breath sounds: No wheezing, rhonchi or rales.  Musculoskeletal:     Right lower leg: No edema.     Left lower leg: No edema.  Neurological:     Mental Status: She is alert.   Laboratory examination:   estimated creatinine clearance is 42.4 mL/min (A) (by C-G formula based on SCr of 1.21 mg/dL (H)).  CMP Latest Ref Rng & Units 08/10/2021 07/23/2021 07/22/2021  Glucose 70 - 99 mg/dL 03(O) 122(Q) 825(O)  BUN 6 - 23 mg/dL 03(B) 04(U) 88(B)  Creatinine 0.40 - 1.20 mg/dL 1.69(I) 5.03(U) 8.82(C)  Sodium 135 - 145 mEq/L 144 140 141  Potassium 3.5 - 5.1 mEq/L 3.6 4.8 4.6  Chloride 96 - 112 mEq/L 104 101 101  CO2 19 - 32 mEq/L 36(H) 26 29  Calcium 8.4 - 10.5 mg/dL 9.5 0.0(L) 8.9  Total Protein 6.0 - 8.3 g/dL 7.3 - -  Total Bilirubin 0.2 - 1.2 mg/dL 0.4 - -  Alkaline Phos 39 - 117 U/L 118(H) - -  AST 0 - 37 U/L 17 - -  ALT 0 - 35 U/L 16 - -   CBC Latest Ref Rng & Units 07/22/2021 07/21/2021 07/20/2021  WBC 4.0 - 10.5 K/uL 10.1 10.8(H) 13.1(H)  Hemoglobin 12.0 - 15.0 g/dL 49.1 79.1 50.5  Hematocrit 36.0 - 46.0 % 46.7(H)  45.9 40.8  Platelets 150 - 400 K/uL 373 366 305    Lipid Panel No results for input(s): CHOL, TRIG, LDLCALC, VLDL, HDL, CHOLHDL, LDLDIRECT in the last 8760 hours.  HEMOGLOBIN A1C No results found for: HGBA1C, MPG TSH No results for input(s): TSH in the last 8760 hours.  External labs:   None   Allergies  No Known Allergies   Medications Prior to Visit:   Outpatient Medications Prior to Visit  Medication Sig Dispense Refill   furosemide (LASIX) 40 MG tablet Take 1 tablet (40 mg total) by mouth daily. 30 tablet 0   ivabradine (CORLANOR) 5 MG TABS tablet Take 1 tablet (5 mg total) by mouth 2 (two) times daily with a meal. 60 tablet 3   Multiple Vitamin (MULTIVITAMIN WITH MINERALS) TABS tablet Take 1 tablet by mouth daily.     sacubitril-valsartan (ENTRESTO) 24-26 MG Take 1 tablet by mouth 2 (two) times daily. 180 tablet 3   fluticasone furoate-vilanterol (BREO ELLIPTA) 200-25 MCG/ACT AEPB Inhale 1 puff into the lungs daily.     No facility-administered medications prior to visit.   Final Medications at End of Visit    Current Meds  Medication Sig   furosemide (LASIX) 40 MG tablet Take 1 tablet (40 mg total) by mouth daily.   ivabradine (CORLANOR) 5 MG TABS tablet Take 1 tablet (5 mg total) by mouth 2 (two) times daily with a meal.   Multiple Vitamin (MULTIVITAMIN WITH MINERALS) TABS tablet Take 1 tablet by mouth daily.   sacubitril-valsartan (ENTRESTO) 24-26 MG Take 1 tablet by mouth 2 (two) times daily.   spironolactone (ALDACTONE) 25 MG tablet Take 0.5 tablets (12.5 mg total) by mouth daily.   [DISCONTINUED] fluticasone furoate-vilanterol (BREO ELLIPTA) 200-25 MCG/ACT AEPB Inhale 1 puff into the lungs daily.   Radiology:   No results found.  Cardiac Studies:   Nuclear stress test 07/21/2021: 1. Negative for ischemia. Fixed small anterior and moderate inferolateral defects: attenuation versus scar. 2. Global hypokinesis with marked LVE. 3. Left ventricular ejection  fraction 21% 4. Non invasive risk stratification*: High   Echocardiogram 07/18/2021:  1. Left ventricular ejection fraction, by estimation, is 25 to 30%. The  left ventricle has severely decreased function. The left ventricle  demonstrates global hypokinesis. Left ventricular diastolic parameters are  consistent with Grade III diastolic dysfunction (restrictive).   2. Right ventricular systolic function is normal. The right ventricular  size is normal.   3. Left atrial size was severely dilated.   4. The mitral valve is normal  in structure. Trivial mitral valve  regurgitation. No evidence of mitral stenosis.   5. The aortic valve is normal in structure. Aortic valve regurgitation is  not visualized. No aortic stenosis is present.   6. The inferior vena cava is normal in size with greater than 50%  respiratory variability, suggesting right atrial pressure of 3 mmHg.  Personally reviewed. LV appears mildly dilated.   EKG:  EKG 08/04/2021: Wandering atrial pacemaker with ventricular rate of 93 bpm.  EKG 07/17/2021: Sinus rhythm 92 bpm Frequent PVC  Assessment     ICD-10-CM   1. Chronic combined systolic and diastolic heart failure (HCC)  123456 Basic metabolic panel    Pro b natriuretic peptide (BNP)[id number])    2. Chronic respiratory failure, unspecified whether with hypoxia or hypercapnia ([place])  J96.10     3. Essential hypertension  I10     4. Abnormal LFTs  R79.89        Medications Discontinued During This Encounter  Medication Reason   fluticasone furoate-vilanterol (BREO ELLIPTA) 200-25 MCG/ACT AEPB Reorder    Meds ordered this encounter  Medications   fluticasone furoate-vilanterol (BREO ELLIPTA) 200-25 MCG/ACT AEPB    Sig: Inhale 1 puff into the lungs daily.    Dispense:  1 each    Refill:  0   spironolactone (ALDACTONE) 25 MG tablet    Sig: Take 0.5 tablets (12.5 mg total) by mouth daily.    Dispense:  45 tablet    Refill:  3     Recommendations:   Valerie Rodgers is a 71 y.o. hypertension, COPD, bipolar disorder, recent cocaine abuse, admitted with acute hypoxic respiratory failure, congestive heart failure, cocaine positive on admission 07/17/2021.   Chronic combined systolic and diastolic heart failure:  Patient is feeling well overall without clinical evidence of acute heart failure.  We will continue Entresto, Corlanor, and Lasix.  Heart rate is now well controlled. Given stable renal function will add spironolactone 12.5 mg p.o. once daily with repeat BMP and proBNP in 1 week. We will continue to avoid initiation of beta-blocker therapy given continued cocaine use. Again reiterated the importance of low-sodium diet as well as medication compliance. Reiterated the importance of complete cessation of cocaine and alcohol use.  Patient admits she is struggling but trying to quit.  Chronic hypoxic respiratory failure: Likely secondary to chronic systolic and diastolic heart failure with LVEF 25-30% as well as underlying COPD. Patient requests refill of COPD inhaler, will provide this today. Patient has upcoming appointment with PCP to establish care, will defer further management of COPD and consideration for pulmonary referral to primary team.  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 We will add spironolactone 12.5 mg p.o. daily with repeat BMP in 1 week  Abnormal LFT: Patient has been evaluated by GI who feels LFT elevation is likely due to alcohol and cocaine use.  They will continue to follow patient  Discussed at length with patient regarding the importance of diet and lifestyle modifications including complete cessation of illicit drug use.  Patient is at high risk for hospitalizations given drug use as well as difficulty with medication compliance.   Follow up in 8 weeks, sooner if needed.     Alethia Berthold, PA-C 08/18/2021, 11:42 AM Office: [phone]

## 2021-08-18 ENCOUNTER — Ambulatory Visit: Payer: Medicare Other | Admitting: Student

## 2021-08-18 ENCOUNTER — Other Ambulatory Visit: Payer: Self-pay

## 2021-08-18 ENCOUNTER — Encounter: Payer: Self-pay | Admitting: Student

## 2021-08-18 VITALS — BP 134/83 | HR 58 | Temp 97.2°F | Resp 17 | Ht 64.0 in | Wt 166.4 lb

## 2021-08-18 DIAGNOSIS — I5042 Chronic combined systolic (congestive) and diastolic (congestive) heart failure: Secondary | ICD-10-CM

## 2021-08-18 DIAGNOSIS — J9610 Chronic respiratory failure, unspecified whether with hypoxia or hypercapnia: Secondary | ICD-10-CM

## 2021-08-18 DIAGNOSIS — I10 Essential (primary) hypertension: Secondary | ICD-10-CM

## 2021-08-18 DIAGNOSIS — R7989 Other specified abnormal findings of blood chemistry: Secondary | ICD-10-CM

## 2021-08-18 MED ORDER — FLUTICASONE FUROATE-VILANTEROL 200-25 MCG/ACT IN AEPB: 1.0000 | INHALATION_SPRAY | Freq: Every day | RESPIRATORY_TRACT | 0 refills | Status: DC

## 2021-08-18 MED ORDER — SPIRONOLACTONE 25 MG PO TABS: 12.5000 mg | ORAL_TABLET | Freq: Every day | ORAL | 3 refills | Status: DC

## 2021-08-24 ENCOUNTER — Telehealth: Payer: Self-pay

## 2021-08-25 NOTE — Telephone Encounter (Signed)
Tried calling patient no answer left a vm

## 2021-08-25 NOTE — Telephone Encounter (Signed)
Please ask her to call primary for refill so they are sure to give the correct one as I rarely prescribe inhalers.

## 2021-09-01 NOTE — Telephone Encounter (Signed)
Tried calling patient again no answer left a detailed voice message and offered patient to call us back if she had any questions or concerns

## 2021-10-12 NOTE — Progress Notes (Deleted)
Primary Physician/Referring:  Javier Docker, MD  Patient ID: Valerie Rodgers, female    DOB: [birth year], 71 y.o.   MRN: [id number]  No chief complaint on file.  HPI:    Valerie SMAII  is a 71 y.o. hypertension, COPD, bipolar disorder, recent cocaine abuse, admitted with acute hypoxic respiratory failure, congestive heart failure, cocaine positive on admission 07/17/2021.   Patient was admitted to [facility] 07/18/2021-07/23/2021 with acute on chronic heart failure.  Patient was diuresed and started on Entresto, Corlanor, and Lasix.  Echocardiogram revealed LVEF 25-30% and grade 3 diastolic dysfunction.    Patient presents for 8-week follow-up.  Last office visit added spironolactone 12.5 mg daily, repeat BMP as well as proBNP have not been done. ***  ***cocaine?   Patient was seen in our office 08/04/2021 for outpatient follow-up at which time she admitted to continued cocaine use.  She had also continued to take hydrochlorothiazide and amlodipine although these had been stopped at discharge.  She now presents for 2-week follow-up.  Repeat labs have shown stable renal function.  Patient is feeling well overall without chest pain, dyspnea, orthopnea, or leg edema.  However she does admit to continued cocaine use.  She was recently evaluated by GI who felt LFT elevations were due to alcohol and cocaine use, no evidence of cirrhosis.  Past Medical History:  Diagnosis Date   Arthritis    Bipolar affective disorder ([place])    Cataract    Past Surgical History:  Procedure Laterality Date   ABDOMINAL HYSTERECTOMY     Family History  Problem Relation Age of Onset   Stroke Father    Colon cancer Neg Hx    Heart disease Neg Hx     Social History   Tobacco Use   Smoking status: Some Days    Packs/day: 0.25    Years: 65.00    Pack years: 16.25    Types: Cigarettes   Smokeless tobacco: Never  Substance Use Topics   Alcohol use: Yes    Comment: 1 pint weekly   Marital  Status: Single   ROS  Review of Systems  Cardiovascular:  Negative for chest pain, claudication, leg swelling, near-syncope, orthopnea, palpitations, paroxysmal nocturnal dyspnea and syncope.  Respiratory:  Negative for shortness of breath.   Neurological:  Negative for dizziness.   Objective  There were no vitals taken for this visit.     08/18/2021   11:09 AM 08/10/2021   10:26 AM 08/04/2021   11:39 AM  Vitals with BMI  Height 5\' 4"  5\' 4"  5\' 4"   Weight 166 lbs 6 oz 160 lbs 171 lbs  BMI 28.55 Q000111Q A999333  Systolic Q000111Q 123456 A999333  Diastolic 83 68 78  Pulse 58 63 58    Physical Exam Vitals reviewed.  Constitutional:      Appearance: She is obese.  Cardiovascular:     Rate and Rhythm: Normal rate and regular rhythm.     Pulses: Intact distal pulses.     Heart sounds: S1 normal and S2 normal. No murmur heard.   No gallop.  Pulmonary:     Effort: Pulmonary effort is normal. No respiratory distress.     Breath sounds: No wheezing, rhonchi or rales.  Musculoskeletal:     Right lower leg: No edema.     Left lower leg: No edema.  Neurological:     Mental Status: She is alert.   Laboratory examination:   CrCl cannot be calculated (Patient's most recent  lab result is older than the maximum 21 days allowed.).     Latest Ref Rng & Units 08/10/2021   11:28 AM 07/23/2021    3:07 AM 07/22/2021    3:23 PM  CMP  Glucose 70 - 99 mg/dL 61   118   129    BUN 6 - 23 mg/dL 29   42   37    Creatinine 0.40 - 1.20 mg/dL 1.21   1.53   1.58    Sodium 135 - 145 mEq/L 144   140   141    Potassium 3.5 - 5.1 mEq/L 3.6   4.8   4.6    Chloride 96 - 112 mEq/L 104   101   101    CO2 19 - 32 mEq/L 36   26   29    Calcium 8.4 - 10.5 mg/dL 9.5   8.7   8.9    Total Protein 6.0 - 8.3 g/dL 7.3      Total Bilirubin 0.2 - 1.2 mg/dL 0.4      Alkaline Phos 39 - 117 U/L 118      AST 0 - 37 U/L 17      ALT 0 - 35 U/L 16          Latest Ref Rng & Units 07/22/2021    1:26 AM 07/21/2021    2:27 AM 07/20/2021    5:42  AM  CBC  WBC 4.0 - 10.5 K/uL 10.1   10.8   13.1    Hemoglobin 12.0 - 15.0 g/dL 14.9   14.7   13.5    Hematocrit 36.0 - 46.0 % 46.7   45.9   40.8    Platelets 150 - 400 K/uL 373   366   305      Lipid Panel No results for input(s): CHOL, TRIG, LDLCALC, VLDL, HDL, CHOLHDL, LDLDIRECT in the last 8760 hours.  HEMOGLOBIN A1C No results found for: HGBA1C, MPG TSH No results for input(s): TSH in the last 8760 hours.  External labs:   None   Allergies  No Known Allergies   Medications Prior to Visit:   Outpatient Medications Prior to Visit  Medication Sig Dispense Refill   fluticasone furoate-vilanterol (BREO ELLIPTA) 200-25 MCG/ACT AEPB Inhale 1 puff into the lungs daily. 1 each 0   furosemide (LASIX) 40 MG tablet Take 1 tablet (40 mg total) by mouth daily. 30 tablet 0   ivabradine (CORLANOR) 5 MG TABS tablet Take 1 tablet (5 mg total) by mouth 2 (two) times daily with a meal. 60 tablet 3   Multiple Vitamin (MULTIVITAMIN WITH MINERALS) TABS tablet Take 1 tablet by mouth daily.     sacubitril-valsartan (ENTRESTO) 24-26 MG Take 1 tablet by mouth 2 (two) times daily. 180 tablet 3   spironolactone (ALDACTONE) 25 MG tablet Take 0.5 tablets (12.5 mg total) by mouth daily. 45 tablet 3   No facility-administered medications prior to visit.   Final Medications at End of Visit    No outpatient medications have been marked as taking for the 10/13/21 encounter (Appointment) with Rayetta Pigg, Ifeanyi Mickelson C, PA-C.   Radiology:   No results found.  Cardiac Studies:   Nuclear stress test 07/21/2021: 1. Negative for ischemia. Fixed small anterior and moderate inferolateral defects: attenuation versus scar. 2. Global hypokinesis with marked LVE. 3. Left ventricular ejection fraction 21% 4. Non invasive risk stratification*: High   Echocardiogram 07/18/2021:  1. Left ventricular ejection fraction, by estimation, is 25 to 30%. The  left ventricle has severely decreased function. The left ventricle   demonstrates global hypokinesis. Left ventricular diastolic parameters are  consistent with Grade III diastolic dysfunction (restrictive).   2. Right ventricular systolic function is normal. The right ventricular  size is normal.   3. Left atrial size was severely dilated.   4. The mitral valve is normal in structure. Trivial mitral valve  regurgitation. No evidence of mitral stenosis.   5. The aortic valve is normal in structure. Aortic valve regurgitation is  not visualized. No aortic stenosis is present.   6. The inferior vena cava is normal in size with greater than 50%  respiratory variability, suggesting right atrial pressure of 3 mmHg.  Personally reviewed. LV appears mildly dilated.   EKG:  EKG 08/04/2021: Wandering atrial pacemaker with ventricular rate of 93 bpm.  EKG 07/17/2021: Sinus rhythm 92 bpm Frequent PVC  Assessment   No diagnosis found.    There are no discontinued medications.   No orders of the defined types were placed in this encounter.   Recommendations:   BELLAROSE SIGNORE is a 71 y.o. hypertension, COPD, bipolar disorder, recent cocaine abuse, admitted with acute hypoxic respiratory failure, congestive heart failure, cocaine positive on admission 07/17/2021.  *** Chronic combined systolic and diastolic heart failure:  Patient is feeling well overall without clinical evidence of acute heart failure.  We will continue Entresto, Corlanor, and Lasix.  Heart rate is now well controlled. Given stable renal function will add spironolactone 12.5 mg p.o. once daily with repeat BMP and proBNP in 1 week. We will continue to avoid initiation of beta-blocker therapy given continued cocaine use. Again reiterated the importance of low-sodium diet as well as medication compliance. Reiterated the importance of complete cessation of cocaine and alcohol use.  Patient admits she is struggling but trying to quit.  Chronic hypoxic respiratory failure: Likely secondary to  chronic systolic and diastolic heart failure with LVEF 25-30% as well as underlying COPD. Patient requests refill of COPD inhaler, will provide this today. Patient has upcoming appointment with PCP to establish care, will defer further management of COPD and consideration for pulmonary referral to primary team.  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 We will add spironolactone 12.5 mg p.o. daily with repeat BMP in 1 week  Abnormal LFT: Patient has been evaluated by GI who feels LFT elevation is likely due to alcohol and cocaine use.  They will continue to follow patient  Discussed at length with patient regarding the importance of diet and lifestyle modifications including complete cessation of illicit drug use.  Patient is at high risk for hospitalizations given drug use as well as difficulty with medication compliance.   Follow up in 8 weeks, sooner if needed.    Alethia Berthold, PA-C 10/12/2021, 1:53 PM Office: [phone]

## 2021-10-13 ENCOUNTER — Ambulatory Visit: Payer: Medicare Other | Admitting: Student

## 2021-10-13 DIAGNOSIS — J9610 Chronic respiratory failure, unspecified whether with hypoxia or hypercapnia: Secondary | ICD-10-CM

## 2021-10-13 DIAGNOSIS — I5042 Chronic combined systolic (congestive) and diastolic (congestive) heart failure: Secondary | ICD-10-CM

## 2021-11-13 ENCOUNTER — Other Ambulatory Visit: Payer: Self-pay

## 2021-11-13 ENCOUNTER — Encounter (HOSPITAL_COMMUNITY): Payer: Self-pay

## 2021-11-13 ENCOUNTER — Inpatient Hospital Stay (HOSPITAL_COMMUNITY)
Admission: EM | Admit: 2021-11-13 | Discharge: 2021-11-18 | DRG: 291 | Disposition: A | Discharge status: Home / Self Care | Payer: Medicare Other | Attending: Internal Medicine | Admitting: Internal Medicine

## 2021-11-13 ENCOUNTER — Emergency Department (HOSPITAL_COMMUNITY): Payer: Medicare Other

## 2021-11-13 DIAGNOSIS — F17210 Nicotine dependence, cigarettes, uncomplicated: Secondary | ICD-10-CM | POA: Diagnosis present

## 2021-11-13 DIAGNOSIS — Z79899 Other long term (current) drug therapy: Secondary | ICD-10-CM

## 2021-11-13 DIAGNOSIS — I248 Other forms of acute ischemic heart disease: Secondary | ICD-10-CM | POA: Diagnosis present

## 2021-11-13 DIAGNOSIS — F1010 Alcohol abuse, uncomplicated: Secondary | ICD-10-CM | POA: Diagnosis present

## 2021-11-13 DIAGNOSIS — I083 Combined rheumatic disorders of mitral, aortic and tricuspid valves: Secondary | ICD-10-CM | POA: Diagnosis present

## 2021-11-13 DIAGNOSIS — I420 Dilated cardiomyopathy: Secondary | ICD-10-CM | POA: Diagnosis present

## 2021-11-13 DIAGNOSIS — Z7189 Other specified counseling: Secondary | ICD-10-CM | POA: Diagnosis not present

## 2021-11-13 DIAGNOSIS — I484 Atypical atrial flutter: Secondary | ICD-10-CM | POA: Diagnosis present

## 2021-11-13 DIAGNOSIS — Z66 Do not resuscitate: Secondary | ICD-10-CM | POA: Diagnosis present

## 2021-11-13 DIAGNOSIS — J449 Chronic obstructive pulmonary disease, unspecified: Secondary | ICD-10-CM | POA: Diagnosis present

## 2021-11-13 DIAGNOSIS — E876 Hypokalemia: Secondary | ICD-10-CM | POA: Diagnosis present

## 2021-11-13 DIAGNOSIS — N1831 Chronic kidney disease, stage 3a: Secondary | ICD-10-CM | POA: Diagnosis not present

## 2021-11-13 DIAGNOSIS — F1410 Cocaine abuse, uncomplicated: Secondary | ICD-10-CM | POA: Diagnosis present

## 2021-11-13 DIAGNOSIS — I130 Hypertensive heart and chronic kidney disease with heart failure and stage 1 through stage 4 chronic kidney disease, or unspecified chronic kidney disease: Principal | ICD-10-CM | POA: Diagnosis present

## 2021-11-13 DIAGNOSIS — F319 Bipolar disorder, unspecified: Secondary | ICD-10-CM | POA: Diagnosis present

## 2021-11-13 DIAGNOSIS — K7460 Unspecified cirrhosis of liver: Secondary | ICD-10-CM | POA: Diagnosis present

## 2021-11-13 DIAGNOSIS — Z9981 Dependence on supplemental oxygen: Secondary | ICD-10-CM

## 2021-11-13 DIAGNOSIS — I5082 Biventricular heart failure: Secondary | ICD-10-CM | POA: Diagnosis present

## 2021-11-13 DIAGNOSIS — Z515 Encounter for palliative care: Secondary | ICD-10-CM | POA: Diagnosis not present

## 2021-11-13 DIAGNOSIS — Z91128 Patient's intentional underdosing of medication regimen for other reason: Secondary | ICD-10-CM

## 2021-11-13 DIAGNOSIS — Z7951 Long term (current) use of inhaled steroids: Secondary | ICD-10-CM

## 2021-11-13 DIAGNOSIS — N179 Acute kidney failure, unspecified: Secondary | ICD-10-CM | POA: Diagnosis present

## 2021-11-13 DIAGNOSIS — I4891 Unspecified atrial fibrillation: Secondary | ICD-10-CM | POA: Diagnosis present

## 2021-11-13 DIAGNOSIS — R778 Other specified abnormalities of plasma proteins: Secondary | ICD-10-CM | POA: Diagnosis not present

## 2021-11-13 DIAGNOSIS — J9621 Acute and chronic respiratory failure with hypoxia: Secondary | ICD-10-CM | POA: Diagnosis present

## 2021-11-13 DIAGNOSIS — I4892 Unspecified atrial flutter: Secondary | ICD-10-CM | POA: Diagnosis present

## 2021-11-13 DIAGNOSIS — Z720 Tobacco use: Secondary | ICD-10-CM | POA: Diagnosis not present

## 2021-11-13 DIAGNOSIS — Z91148 Patient's other noncompliance with medication regimen for other reason: Secondary | ICD-10-CM

## 2021-11-13 DIAGNOSIS — I5043 Acute on chronic combined systolic (congestive) and diastolic (congestive) heart failure: Secondary | ICD-10-CM | POA: Diagnosis present

## 2021-11-13 DIAGNOSIS — T501X6A Underdosing of loop [high-ceiling] diuretics, initial encounter: Secondary | ICD-10-CM | POA: Diagnosis present

## 2021-11-13 DIAGNOSIS — I5023 Acute on chronic systolic (congestive) heart failure: Secondary | ICD-10-CM | POA: Diagnosis present

## 2021-11-13 DIAGNOSIS — G9341 Metabolic encephalopathy: Secondary | ICD-10-CM | POA: Diagnosis present

## 2021-11-13 DIAGNOSIS — M1990 Unspecified osteoarthritis, unspecified site: Secondary | ICD-10-CM | POA: Diagnosis present

## 2021-11-13 DIAGNOSIS — Z9181 History of falling: Secondary | ICD-10-CM

## 2021-11-13 DIAGNOSIS — N1832 Chronic kidney disease, stage 3b: Secondary | ICD-10-CM | POA: Diagnosis present

## 2021-11-13 DIAGNOSIS — R0602 Shortness of breath: Principal | ICD-10-CM

## 2021-11-13 DIAGNOSIS — Z90710 Acquired absence of both cervix and uterus: Secondary | ICD-10-CM

## 2021-11-13 LAB — CBC WITH DIFFERENTIAL/PLATELET
Abs Immature Granulocytes: 0.02 10*3/uL (ref 0.00–0.07)
Basophils Absolute: 0 10*3/uL (ref 0.0–0.1)
Basophils Relative: 0 %
Eosinophils Absolute: 0.1 10*3/uL (ref 0.0–0.5)
Eosinophils Relative: 2 %
HCT: 45 % (ref 36.0–46.0)
Hemoglobin: 14.2 g/dL (ref 12.0–15.0)
Immature Granulocytes: 0 %
Lymphocytes Relative: 30 %
Lymphs Abs: 1.8 10*3/uL (ref 0.7–4.0)
MCH: 28.1 pg (ref 26.0–34.0)
MCHC: 31.6 g/dL (ref 30.0–36.0)
MCV: 88.9 fL (ref 80.0–100.0)
Monocytes Absolute: 0.6 10*3/uL (ref 0.1–1.0)
Monocytes Relative: 10 %
Neutro Abs: 3.4 10*3/uL (ref 1.7–7.7)
Neutrophils Relative %: 58 %
Platelets: 167 10*3/uL (ref 150–400)
RBC: 5.06 MIL/uL (ref 3.87–5.11)
RDW: 16.4 % — ABNORMAL HIGH (ref 11.5–15.5)
WBC: 5.9 10*3/uL (ref 4.0–10.5)
nRBC: 0 % (ref 0.0–0.2)

## 2021-11-13 LAB — MAGNESIUM: Magnesium: 1.8 mg/dL (ref 1.7–2.4)

## 2021-11-13 LAB — COMPREHENSIVE METABOLIC PANEL
ALT: 49 U/L — ABNORMAL HIGH (ref 0–44)
AST: 41 U/L (ref 15–41)
Albumin: 3 g/dL — ABNORMAL LOW (ref 3.5–5.0)
Alkaline Phosphatase: 80 U/L (ref 38–126)
Anion gap: 7 (ref 5–15)
BUN: 22 mg/dL (ref 8–23)
CO2: 23 mmol/L (ref 22–32)
Calcium: 8.5 mg/dL — ABNORMAL LOW (ref 8.9–10.3)
Chloride: 114 mmol/L — ABNORMAL HIGH (ref 98–111)
Creatinine, Ser: 1.63 mg/dL — ABNORMAL HIGH (ref 0.44–1.00)
GFR, Estimated: 33 mL/min — ABNORMAL LOW (ref >60)
Glucose, Bld: 103 mg/dL — ABNORMAL HIGH (ref 70–99)
Potassium: 4 mmol/L (ref 3.5–5.1)
Sodium: 144 mmol/L (ref 135–145)
Total Bilirubin: 0.8 mg/dL (ref 0.3–1.2)
Total Protein: 5.5 g/dL — ABNORMAL LOW (ref 6.5–8.1)

## 2021-11-13 LAB — OSMOLALITY: Osmolality: 303 mOsm/kg — ABNORMAL HIGH (ref 275–295)

## 2021-11-13 LAB — TROPONIN I (HIGH SENSITIVITY)
Troponin I (High Sensitivity): 122 ng/L (ref <18)
Troponin I (High Sensitivity): 89 ng/L — ABNORMAL HIGH (ref <18)

## 2021-11-13 LAB — PROCALCITONIN: Procalcitonin: <0.1 ng/mL

## 2021-11-13 LAB — BLOOD GAS, VENOUS
Acid-Base Excess: 2.6 mmol/L — ABNORMAL HIGH (ref 0.0–2.0)
Bicarbonate: 29.7 mmol/L — ABNORMAL HIGH (ref 20.0–28.0)
Drawn by: 44525
O2 Saturation: 23.2 %
Patient temperature: 36.5
pCO2, Ven: 54 mmHg (ref 44–60)
pH, Ven: 7.35 (ref 7.25–7.43)
pO2, Ven: <31 mmHg — CL (ref 32–45)

## 2021-11-13 LAB — TSH: TSH: 3.57 u[IU]/mL (ref 0.350–4.500)

## 2021-11-13 LAB — BRAIN NATRIURETIC PEPTIDE: B Natriuretic Peptide: 1828.7 pg/mL — ABNORMAL HIGH (ref 0.0–100.0)

## 2021-11-13 LAB — PROTIME-INR
INR: 1.2 (ref 0.8–1.2)
Prothrombin Time: 14.9 seconds (ref 11.4–15.2)

## 2021-11-13 LAB — PHOSPHORUS: Phosphorus: 2.1 mg/dL — ABNORMAL LOW (ref 2.5–4.6)

## 2021-11-13 LAB — CK: Total CK: 96 U/L (ref 38–234)

## 2021-11-13 MED ORDER — HYDROCODONE-ACETAMINOPHEN 5-325 MG PO TABS: 1.0000 | ORAL_TABLET | ORAL | Status: DC | PRN

## 2021-11-13 MED ORDER — SODIUM PHOSPHATES 45 MMOLE/15ML IV SOLN
15.0000 mmol | Freq: Once | INTRAVENOUS | Status: AC
  Administered 2021-11-14: 15 mmol via INTRAVENOUS
  Filled 2021-11-13: qty 5

## 2021-11-13 MED ORDER — HEPARIN (PORCINE) 25000 UT/250ML-% IV SOLN
1000.0000 [IU]/h | INTRAVENOUS | Status: DC
  Administered 2021-11-13 – 2021-11-14 (×2): 1000 [IU]/h via INTRAVENOUS
  Filled 2021-11-13 (×2): qty 250

## 2021-11-13 MED ORDER — FUROSEMIDE 10 MG/ML IJ SOLN
40.0000 mg | Freq: Two times a day (BID) | INTRAMUSCULAR | Status: DC
  Administered 2021-11-14 – 2021-11-17 (×7): 40 mg via INTRAVENOUS
  Filled 2021-11-13 (×7): qty 4

## 2021-11-13 MED ORDER — NICOTINE 21 MG/24HR TD PT24
21.0000 mg | MEDICATED_PATCH | Freq: Every day | TRANSDERMAL | Status: DC
Start: 2021-11-14 — End: 2021-11-18
  Administered 2021-11-14 – 2021-11-18 (×5): 21 mg via TRANSDERMAL
  Filled 2021-11-13 (×5): qty 1

## 2021-11-13 MED ORDER — AMIODARONE LOAD VIA INFUSION
150.0000 mg | Freq: Once | INTRAVENOUS | Status: AC
  Administered 2021-11-13: 150 mg via INTRAVENOUS
  Filled 2021-11-13: qty 83.34

## 2021-11-13 MED ORDER — AMIODARONE HCL IN DEXTROSE 360-4.14 MG/200ML-% IV SOLN
60.0000 mg/h | INTRAVENOUS | Status: DC
Start: 2021-11-13 — End: 2021-11-14
  Administered 2021-11-13 – 2021-11-14 (×2): 60 mg/h via INTRAVENOUS
  Filled 2021-11-13 (×2): qty 200

## 2021-11-13 MED ORDER — DILTIAZEM HCL-DEXTROSE 125-5 MG/125ML-% IV SOLN (PREMIX)
5.0000 mg/h | INTRAVENOUS | Status: DC
  Administered 2021-11-13: 5 mg/h via INTRAVENOUS
  Filled 2021-11-13: qty 125

## 2021-11-13 MED ORDER — SPIRONOLACTONE 12.5 MG HALF TABLET
12.5000 mg | ORAL_TABLET | Freq: Every day | ORAL | Status: DC
  Filled 2021-11-13: qty 1

## 2021-11-13 MED ORDER — FLUTICASONE FUROATE-VILANTEROL 200-25 MCG/ACT IN AEPB
1.0000 | INHALATION_SPRAY | Freq: Every day | RESPIRATORY_TRACT | Status: DC
  Administered 2021-11-14 – 2021-11-18 (×5): 1 via RESPIRATORY_TRACT
  Filled 2021-11-13: qty 28

## 2021-11-13 MED ORDER — HEPARIN BOLUS VIA INFUSION
4000.0000 [IU] | Freq: Once | INTRAVENOUS | Status: AC
  Administered 2021-11-13: 4000 [IU] via INTRAVENOUS
  Filled 2021-11-13: qty 4000

## 2021-11-13 MED ORDER — ACETAMINOPHEN 325 MG PO TABS: 650.0000 mg | ORAL_TABLET | Freq: Four times a day (QID) | ORAL | Status: DC | PRN

## 2021-11-13 MED ORDER — ACETAMINOPHEN 650 MG RE SUPP: 650.0000 mg | Freq: Four times a day (QID) | RECTAL | Status: DC | PRN

## 2021-11-13 MED ORDER — AMIODARONE HCL IN DEXTROSE 360-4.14 MG/200ML-% IV SOLN
30.0000 mg/h | INTRAVENOUS | Status: DC
  Administered 2021-11-14 (×2): 30 mg/h via INTRAVENOUS
  Filled 2021-11-13 (×2): qty 200

## 2021-11-13 MED ORDER — FUROSEMIDE 10 MG/ML IJ SOLN
40.0000 mg | Freq: Once | INTRAMUSCULAR | Status: AC
  Administered 2021-11-13: 40 mg via INTRAVENOUS
  Filled 2021-11-13: qty 4

## 2021-11-13 NOTE — Assessment & Plan Note (Addendum)
After using cocaine patient noted to be in a flutter with RVR heart rates above 100.  ER provider started on Cardizem would need to have further discussion with cardiology given history of CHF and reduced EF Cardizem may not be the best choice. Discussed with cardiology will start on amiodarone and heparin drip Given history of ongoing cocaine abuse/noncompliance would have to further discuss potential for long term anticoagulation as patient is high risk

## 2021-11-13 NOTE — Assessment & Plan Note (Signed)
Patient with history of alcohol abuse.  Prior work-up for cirrhosis because this was unremarkable.  Continue to follow-up as an outpatient.  Fluid management as needed

## 2021-11-13 NOTE — H&P (Signed)
Valerie Rodgers [id number] DOB: 06/22/1950 DOA: 11/13/2021     PCP: Merryl Hacker, No   Outpatient Specialists:  CARDS: [facility] cardiology    ER on 11/13/21 at [place] Referred by Attending Horton, Alvin Critchley, DO   Patient coming from:    home Lives w roomate    Chief Complaint:   Chief Complaint  Patient presents with   Shortness of Breath   Leg Swelling   Atrial Flutter    HPI: Valerie Rodgers is a 71 y.o. female with medical history significant of CHF, cocaine abuse, bipolar, CKD, cirrhosis of liver, on chronic O2, COPD    Presented with   palpitations fluid overload Patient coming in from home complaining of shortness of breath leg edema for the past 3 days no chest pain associated.   She has been using cocaine and last time used was right before EMS arrived.  Otherwise alert and oriented  Reports palpitations after did some cocaine Have not been taking any of her meds  Reports she does not know how to use O2 She sometimes feels unstable when she walks No bleeding no blood in stool  Denies ETOH still smokes and does cocaine states she is interested in [place] February 2023 -Stress test was negative for inducible ischemia, fixed defect noted During last admit in February Started on Entresto, Aldactone, Corlanor, Lasix not sure if compliant   Regarding pertinent Chronic problems:       chronic CHF diastolic/systolic/ combined - last echo 1/23 Grade III diastolic EF 25 to [id number] On Lasix , entresto, spironolactone     COPD - not followed by pulmonology    on baseline oxygen  2L,     CKD stage IIIb- baseline Cr 1.5 CrCl cannot be calculated (Unknown ideal weight.).  Lab Results  Component Value Date   CREATININE 1.63 (H) 11/13/2021   CREATININE 1.21 (H) 08/10/2021   CREATININE 1.53 (H) 07/23/2021      Liver disease Computed MELD-Na score unavailable. Necessary lab results were not found in the last year. Computed MELD score unavailable. Necessary lab results were not  found in the last year. -Patient admits to occasional EtOH use but not heavy, uses cocaine regularly -Hepatitis B and C serologies are negative- was supposed to have GI referral       While in ER:     A.flutter note HR up to 120's now  Ordered     CXR - Left basilar atelectasis and/or consolidation. 2. Mild diffuse interstitial prominence could represent mild interstitial edema or the sequela of recurrent bouts of CHF.    Following Medications were ordered in ER: Medications  furosemide (LASIX) injection 40 mg (has no administration in time range)  diltiazem (CARDIZEM) 125 mg in dextrose 5% 125 mL (1 mg/mL) infusion (has no administration in time range)       ED Triage Vitals  Enc Vitals Group     BP 11/13/21 1837 109/83     Pulse Rate 11/13/21 1837 87     Resp 11/13/21 1837 (!) 23     Temp 11/13/21 1913 97.9 F (36.6 C)     Temp Source 11/13/21 1913 Oral     SpO2 11/13/21 1833 100 %     Weight --      Height --      Head Circumference --      Peak Flow --      Pain Score 11/13/21 1834 0     Pain Loc --  Pain Edu? --      Excl. in [place]? --   TMAX(24)@     _________________________________________ Significant initial  Findings: Abnormal Labs Reviewed  CBC WITH DIFFERENTIAL/PLATELET - Abnormal; Notable for the following components:      Result Value   RDW 16.4 (*)    All other components within normal limits  COMPREHENSIVE METABOLIC PANEL - Abnormal; Notable for the following components:   Chloride 114 (*)    Glucose, Bld 103 (*)    Creatinine, Ser 1.63 (*)    Calcium 8.5 (*)    Total Protein 5.5 (*)    Albumin 3.0 (*)    ALT 49 (*)    GFR, Estimated 33 (*)    All other components within normal limits  BRAIN NATRIURETIC PEPTIDE - Abnormal; Notable for the following components:   B Natriuretic Peptide 1,828.7 (*)    All other components within normal limits  TROPONIN I (HIGH SENSITIVITY) - Abnormal; Notable for the following components:   Troponin I  (High Sensitivity) 122 (*)    All other components within normal limits     _________________________ Troponin 122  ECG: Ordered Personally reviewed by me showing: HR : 80 Rhythm Paired ventricular premature complexes Probable lateral infarct, age indeterminate ST elevation, consider inferior injury QTC 414   The recent clinical data is shown below. Vitals:   11/13/21 1913 11/13/21 1930 11/13/21 2050 11/13/21 2053  BP:  109/75 114/61   Pulse:  (!) 44 78 (!) 49  Resp:  19 (!) 21 16  Temp: 97.9 F (36.6 C)     TempSrc: Oral     SpO2:  98% 95% 96%    WBC     Component Value Date/Time   WBC 5.9 11/13/2021 1843   LYMPHSABS 1.8 11/13/2021 1843   MONOABS 0.6 11/13/2021 1843   EOSABS 0.1 11/13/2021 1843   BASOSABS 0.0 11/13/2021 1843     _______________________________________________ Hospitalist was called for admission for acute on chronic CHF exacerbation   The following Work up has been ordered so far:  Orders Placed This Encounter  Procedures   DG Chest Port 1 View   CBC with Differential   Comprehensive metabolic panel   Brain natriuretic peptide   Magnesium   Consult for [facility] Medical Admission   EKG 12-Lead     OTHER Significant initial  Findings:  labs showing:    Recent Labs  Lab 11/13/21 1843  NA 144  K 4.0  CO2 23  GLUCOSE 103*  BUN 22  CREATININE 1.63*  CALCIUM 8.5*  MG 1.8    Cr   Up from baseline see below Lab Results  Component Value Date   CREATININE 1.63 (H) 11/13/2021   CREATININE 1.21 (H) 08/10/2021   CREATININE 1.53 (H) 07/23/2021    Recent Labs  Lab 11/13/21 1843  AST 41  ALT 49*  ALKPHOS 80  BILITOT 0.8  PROT 5.5*  ALBUMIN 3.0*   Lab Results  Component Value Date   CALCIUM 8.5 (L) 11/13/2021        Plt: Lab Results  Component Value Date   PLT 167 11/13/2021      ABG    Component Value Date/Time   HCO3 29.8 (H) 07/17/2021 2349   TCO2 31 07/17/2021 2349   O2SAT 74.0 07/17/2021 2349        Recent Labs  Lab 11/13/21 1843  WBC 5.9  NEUTROABS 3.4  HGB 14.2  HCT 45.0  MCV 88.9  PLT 167  HG/HCT  stable,     Component Value Date/Time   HGB 14.2 11/13/2021 1843   HCT 45.0 11/13/2021 1843   MCV 88.9 11/13/2021 1843     No results for input(s): LIPASE, AMYLASE in the last 168 hours. No results for input(s): AMMONIA in the last 168 hours.    Cardiac Panel (last 3 results) No results for input(s): CKTOTAL, CKMB, TROPONINI, RELINDX in the last 72 hours.  .car BNP (last 3 results) Recent Labs    07/17/21 2206 07/20/21 0542 11/13/21 1843  BNP 2,729.9* 509.0* 1,828.7*      DM  labs:  HbA1C: No results for input(s): HGBA1C in the last 8760 hours.     CBG (last 3)  No results for input(s): GLUCAP in the last 72 hours.        Cultures:    Component Value Date/Time   SDES BLOOD RIGHT HAND 07/18/2021 0503   SPECREQUEST  07/18/2021 0503    BOTTLES DRAWN AEROBIC AND ANAEROBIC Blood Culture adequate volume   CULT  07/18/2021 0503    NO GROWTH 5 DAYS Performed at [facility], [place] [address], [place], [place] [address]    REPTSTATUS 07/23/2021 FINAL 07/18/2021 0503     Radiological Exams on Admission: DG Chest Port 1 View  Result Date: 11/13/2021 CLINICAL DATA:  Shortness of breath. EXAM: PORTABLE CHEST 1 VIEW COMPARISON:  Chest radiograph 07/19/2021. FINDINGS: Enlarged cardiac silhouette. Diffuse mild interstitial prominence. Left basilar opacities. No definite pleural effusions or visible pneumothorax. Biapical pleuroparenchymal scarring. No displaced fracture. IMPRESSION: 1. Left basilar atelectasis and/or consolidation. 2. Mild diffuse interstitial prominence could represent mild interstitial edema or the sequela of recurrent bouts of CHF. 3. Cardiomegaly. Electronically Signed   By: Margaretha Sheffield M.D.   On: 11/13/2021 19:14   _______________________________________________________________________________________________________ Latest  Blood  pressure 114/61, pulse (!) 49, temperature 97.9 F (36.6 C), temperature source Oral, resp. rate 16, SpO2 96 %.   Vitals  labs and radiology finding personally reviewed  Review of Systems:    Pertinent positives include:  shortness of breath at rest. Bilateral lower extremity swelling  GI:   Constitutional:  No weight loss, night sweats, Fevers, chills, fatigue, weight loss  HEENT:  No headaches, Difficulty swallowing,Tooth/dental problems,Sore throat,  No sneezing, itching, ear ache, nasal congestion, post nasal drip,  Cardio-vascular:  No chest pain, Orthopnea, PND, anasarca, dizziness, palpitations.no  No heartburn, indigestion, abdominal pain, nausea, vomiting, diarrhea, change in bowel habits, loss of appetite, melena, blood in stool, hematemesis Resp:  no  No dyspnea on exertion, No excess mucus, no productive cough, No non-productive cough, No coughing up of blood.No change in color of mucus.No wheezing. Skin:  no rash or lesions. No jaundice GU:  no dysuria, change in color of urine, no urgency or frequency. No straining to urinate.  No flank pain.  Musculoskeletal:  No joint pain or no joint swelling. No decreased range of motion. No back pain.  Psych:  No change in mood or affect. No depression or anxiety. No memory loss.  Neuro: no localizing neurological complaints, no tingling, no weakness, no double vision, no gait abnormality, no slurred speech, no confusion  All systems reviewed and apart from [place] all are negative _______________________________________________________________________________________________ Past Medical History:   Past Medical History:  Diagnosis Date   Arthritis    Bipolar affective disorder ([place])    Cataract       Past Surgical History:  Procedure Laterality Date   ABDOMINAL HYSTERECTOMY  Social History:  Ambulatory  cane,      reports that she has been smoking cigarettes. She has a 16.25 pack-year smoking history. She has  never used smokeless tobacco. She reports current alcohol use. She reports current drug use. Drugs: Cocaine and Marijuana.     Family History:   Family History  Problem Relation Age of Onset   Stroke Father    Colon cancer Neg Hx    Heart disease Neg Hx    ______________________________________________________________________________________________ Allergies: No Known Allergies   Prior to Admission medications   Medication Sig Start Date End Date Taking? Authorizing Provider  fluticasone furoate-vilanterol (BREO ELLIPTA) 200-25 MCG/ACT AEPB Inhale 1 puff into the lungs daily. 08/18/21   Cantwell, Celeste C, PA-C  furosemide (LASIX) 40 MG tablet Take 1 tablet (40 mg total) by mouth daily. 07/23/21   Domenic Polite, MD  ivabradine (CORLANOR) 5 MG TABS tablet Take 1 tablet (5 mg total) by mouth 2 (two) times daily with a meal. 08/04/21   Cantwell, Celeste C, PA-C  Multiple Vitamin (MULTIVITAMIN WITH MINERALS) TABS tablet Take 1 tablet by mouth daily.    [provider]  sacubitril-valsartan (ENTRESTO) 24-26 MG Take 1 tablet by mouth 2 (two) times daily. 08/04/21   Cantwell, Celeste C, PA-C  spironolactone (ALDACTONE) 25 MG tablet Take 0.5 tablets (12.5 mg total) by mouth daily. 08/18/21 08/13/22  Alethia Berthold, PA-C    ___________________________________________________________________________________________________ Physical Exam:    11/13/2021    8:53 PM 11/13/2021    8:50 PM 11/13/2021    7:30 PM  Vitals with BMI  Systolic  [id number]  Diastolic  61 75  Pulse 49 78 44     1. General:  in No  Acute distress    Chronically ill   -appearing 2. Psychological: Alert and   Oriented 3. Head/ENT:   Moist   Mucous Membranes                          Head Non traumatic, neck supple                         Poor Dentition 4. SKIN: normal  Skin turgor,  Skin clean Dry and intact no rash 5. Heart: Regular rate and rhythm no  Murmur, no Rub or gallop 6. Lungs:  no wheezes or  crackles   7. Abdomen: Soft,  non-tender, Non distended   obese  bowel sounds present 8. Lower extremities: no clubbing, cyanosis, 2+ edema 9. Neurologically Grossly intact, moving all 4 extremities equally   10. MSK: Normal range of motion    Chart has been reviewed  ______________________________________________________________________________________________  Assessment/Plan  71 y.o. female with medical history significant of CHF, cocaine abuse, bipolar, CKD, cirrhosis of liver, on chronic O2, COPD    Admitted for acute on chronic CHF exacerbation   Present on Admission:  Acute on chronic combined systolic and diastolic CHF (congestive heart failure) (HCC)  Elevated troponin level not due myocardial infarction  Cocaine abuse (HCC)  Bipolar disorder (HCC)  Chronic kidney disease, stage 3a (HCC)  Cirrhosis of liver (HCC)  Atrial flutter, paroxysmal (HCC)  Hypophosphatemia  Acute on chronic respiratory failure with hypoxia (HCC)  Tobacco abuse     Elevated troponin level not due myocardial infarction Chronic close to baseline no chest pain continue to monitor appreciate cardiology involvement repeat echo in the morning  Cocaine abuse ([place]) Ongoing order transitional care consult's consult  regarding quitting  Bipolar disorder ([place]) Chronic ongoing  Chronic kidney disease, stage 3a ([place]) Monitor renal function while diuresis  -chronic avoid nephrotoxic medications such as NSAIDs, Vanco Zosyn combo,  avoid hypotension, continue to follow renal function   Acute on chronic combined systolic and diastolic CHF (congestive heart failure) ([place]) - Pt diagnosed with CHF based on presence of the following:  rales on exam,  cardiomegaly, Pulmonary edema on CXR, and  bilateral leg edema, DOE, tachycardia (hr>120) With noted response to IV diuretic in ER  admit on telemetry,  cycle cardiac enzymes, Troponin 122 89   obtain serial ECG  to evaluate for ischemia as a cause of heart  failure  monitor daily weight: There were no vitals filed for this visit. Last BNP BNP (last 3 results) Recent Labs    07/17/21 2206 07/20/21 0542 11/13/21 1843  BNP 2,729.9* 509.0* 1,828.7*       diurese with IV lasix and monitor orthostatics and creatinine to avoid over diuresis.  Order echogram to evaluate EF and valves  ACE/ARBi Contraindicated given rising creatinine hold [facility]   cardiology consulted    Cirrhosis of liver [facility])  Patient with history of alcohol abuse.  Prior work-up for cirrhosis because this was unremarkable.  Continue to follow-up as an outpatient.  Fluid management as needed   Atrial flutter, paroxysmal (HCC) After using cocaine patient noted to be in a flutter with RVR heart rates above 100.  ER provider started on Cardizem would need to have further discussion with cardiology given history of CHF and reduced EF Cardizem may not be the best choice. Discussed with cardiology will start on amiodarone and heparin drip Given history of ongoing cocaine abuse/noncompliance would have to further discuss potential for long term anticoagulation as patient is high risk  Hypophosphatemia Will replace  Acute on chronic respiratory failure with hypoxia ([place]) Pt supposed to be on 2L of O2at baseline but not compliant restart home o2  Tobacco abuse  - Spoke about importance of quitting spent 5 minutes discussing options for treatment, prior attempts at quitting, and dangers of smoking  -At this point patient is    interested in quitting  - order nicotine patch   - nursing tobacco cessation protocol    Other plan as per orders.  DVT prophylaxis:  heparin    Code Status:    Code Status: Prior FULL CODE  as per patient   I had personally discussed CODE STATUS with patient     Family Communication:   Family not at  Bedside    Disposition Plan:     likely will need placement for rehabilitation                             Following barriers for  discharge:                            Electrolytes corrected                                                          Will need consultants to evaluate patient prior to discharge     Would benefit from PT/OT eval prior to DC  Ordered  Swallow eval - SLP ordered                                      Transition of care consulted                   Nutrition    consulted                   Consults called:    notified Cardiology , discussed with Dr. Terri Skains  Admission status:  ED Disposition     ED Disposition  Admit   Condition  --   [place]: [place] [[id number]]  Level of Care: Progressive [102]  Admit to Progressive based on following criteria: CARDIOVASCULAR & THORACIC of moderate stability with acute coronary syndrome symptoms/low risk myocardial infarction/hypertensive urgency/arrhythmias/heart failure potentially compromising stability and stable post cardiovascular intervention patients.  May admit patient to [facility] or [facility] if equivalent level of care is available:: No  Covid Evaluation: Asymptomatic - no recent exposure (last 10 days) testing not required  Diagnosis: Acute on chronic combined systolic and diastolic CHF (congestive heart failure) [facility]) [id number]  Admitting Physician: Toy Baker [[id number]]  Attending Physician: Toy Baker [[id number]]  Estimated length of stay: past midnight tomorrow  Certification:: I certify this patient will need inpatient services for at least 2 midnights            inpatient     I Expect 2 midnight stay secondary to severity of patient's current illness need for inpatient interventions justified by the following:  hemodynamic instability despite optimal treatment (tachycardia * )  Severe lab/radiological/exam abnormalities including:    Elevated troponin and extensive comorbidities including:  substance abuse    CHF  COPD/asthma   CKD    That are currently  affecting medical management.   I expect  patient to be hospitalized for 2 midnights requiring inpatient medical care.  Patient is at high risk for adverse outcome (such as loss of life or disability) if not treated.  Indication for inpatient stay as follows:    Hemodynamic instability despite maximal medical therapy,      Need for IV rate controling medications,     Level of care       progressive tele indefinitely please discontinue once patient no longer qualifies COVID-19 Labs    Lab Results  Component Value Date   [place] NEGATIVE 07/17/2021      Aamilah Augenstein 11/13/2021, 10:45 PM    [facility]     after 2 AM please page floor coverage PA If 7AM-7PM, please contact the day team taking care of the patient using [email or web address]   Patient was evaluated in the context of the global COVID-19 pandemic, which necessitated consideration that the patient might be at risk for infection with the SARS-CoV-2 virus that causes COVID-19. Institutional protocols and algorithms that pertain to the evaluation of patients at risk for COVID-19 are in a state of rapid change based on information released by regulatory bodies including the CDC and federal and state organizations. These policies and algorithms were followed during the patient's care.

## 2021-11-13 NOTE — Assessment & Plan Note (Signed)
-   Spoke about importance of quitting spent 5 minutes discussing options for treatment, prior attempts at quitting, and dangers of smoking ? -At this point patient is    interested in quitting ? - order nicotine patch  ? - nursing tobacco cessation protocol ? ?

## 2021-11-13 NOTE — Assessment & Plan Note (Signed)
Ongoing order transitional care consult's consult regarding quitting

## 2021-11-13 NOTE — Subjective & Objective (Signed)
Patient coming in from home complaining of shortness of breath leg edema for the past 3 days no chest pain associated.   She has been using cocaine and last time used was right before EMS arrived.  Otherwise alert and oriented

## 2021-11-13 NOTE — Assessment & Plan Note (Signed)
Chronic close to baseline no chest pain continue to monitor appreciate cardiology involvement repeat echo in the morning

## 2021-11-13 NOTE — Assessment & Plan Note (Signed)
Monitor renal function while diuresis  -chronic avoid nephrotoxic medications such as NSAIDs, Vanco Zosyn combo,  avoid hypotension, continue to follow renal function

## 2021-11-13 NOTE — ED Triage Notes (Signed)
Pt arrives via ems from home. Pt reports increased sob and lower extremity swelling over the past 3 days. Pt denies cp. She does report cocaine use not long before she called EMS. Pt is alert and oriented x 4.

## 2021-11-13 NOTE — Progress Notes (Signed)
ANTICOAGULATION CONSULT NOTE - Initial Consult  Pharmacy Consult for heparin Indication: atrial fibrillation  No Known Allergies  Patient Measurements:   Heparin Dosing Weight: 71kg  Vital Signs: Temp: 97.9 F (36.6 C) (05/28 1913) Temp Source: Oral (05/28 1913) BP: 114/61 (05/28 2050) Pulse Rate: 49 (05/28 2053)  Labs: Recent Labs    11/13/21 1843 11/13/21 2106  HGB 14.2  --   HCT 45.0  --   PLT 167  --   CREATININE 1.63*  --   CKTOTAL  --  96  TROPONINIHS 122* 89*    CrCl cannot be calculated (Unknown ideal weight.).   Medical History: Past Medical History:  Diagnosis Date   Arthritis    Bipolar affective disorder [facility])    Cataract     Assessment: 71 year old female with past medical history of CHF, noncompliance with medication presents emergency department with concern for shortness of breath and palpitations. Patient currently in new atrial flutter. She does not appear to be on anticoagulation prior to admit. New orders to start IV heparin. CBC appears within normal limits.    Goal of Therapy:  Heparin level 0.3-0.7 units/ml Monitor platelets by anticoagulation protocol: Yes   Plan:  Give 4000 units bolus x 1 Start heparin infusion at 1000 units/hr Check anti-Xa level in 8 hours and daily while on heparin Continue to monitor H&H and platelets  Sheppard Coil PharmD., BCPS Clinical Pharmacist 11/13/2021 10:39 PM

## 2021-11-13 NOTE — Assessment & Plan Note (Signed)
Pt supposed to be on 2L of O2at baseline but not compliant restart home o2

## 2021-11-13 NOTE — ED Provider Notes (Signed)
[name]   CSN: [id number] Arrival date & time: 11/13/21  1820     History  Chief Complaint  Patient presents with   Shortness of Breath   Leg Swelling   Atrial Flutter    Valerie Rodgers is a 71 y.o. female.  HPI  71 year old female with past medical history of CHF, noncompliance with medication presents emergency department with concern for shortness of breath and palpitations.  Patient has been admitted to our facility before for CHF exacerbation.  She states for the last 3 days she feel like her fluid has been getting worse, unclear if she has been compliant with her Lasix.  Tonight she did cocaine and after that started feeling palpitations and worsening shortness of breath.  She denies any chest or back pain.  No recent fever, cough or other acute illness.  She is otherwise not a very forthcoming historian.  Home Medications Prior to Admission medications   Medication Sig Start Date End Date Taking? Authorizing Provider  fluticasone furoate-vilanterol (BREO ELLIPTA) 200-25 MCG/ACT AEPB Inhale 1 puff into the lungs daily. 08/18/21   Cantwell, Celeste C, PA-C  furosemide (LASIX) 40 MG tablet Take 1 tablet (40 mg total) by mouth daily. 07/23/21   Domenic Polite, MD  ivabradine (CORLANOR) 5 MG TABS tablet Take 1 tablet (5 mg total) by mouth 2 (two) times daily with a meal. 08/04/21   Cantwell, Celeste C, PA-C  Multiple Vitamin (MULTIVITAMIN WITH MINERALS) TABS tablet Take 1 tablet by mouth daily.    [provider]  sacubitril-valsartan (ENTRESTO) 24-26 MG Take 1 tablet by mouth 2 (two) times daily. 08/04/21   Cantwell, Celeste C, PA-C  spironolactone (ALDACTONE) 25 MG tablet Take 0.5 tablets (12.5 mg total) by mouth daily. 08/18/21 08/13/22  Cantwell, Anderson Malta C, PA-C      Allergies    Patient has no known allergies.    Review of Systems   Review of Systems  Constitutional:  Positive for fatigue. Negative for fever.   Respiratory:  Positive for shortness of breath.   Cardiovascular:  Positive for palpitations and leg swelling. Negative for chest pain.  Gastrointestinal:  Negative for abdominal pain, diarrhea and vomiting.  Musculoskeletal:  Negative for back pain.  Skin:  Negative for rash.  Neurological:  Negative for headaches.   Physical Exam Updated Vital Signs BP 114/61   Pulse (!) 49   Temp 97.9 F (36.6 C) (Oral)   Resp 16   SpO2 96%  Physical Exam Vitals and nursing note reviewed.  Constitutional:      General: She is not in acute distress.    Appearance: Normal appearance. She is not diaphoretic.  HENT:     Head: Normocephalic.     Mouth/Throat:     Mouth: Mucous membranes are moist.  Cardiovascular:     Rate and Rhythm: Tachycardia present. Rhythm irregular.  Pulmonary:     Effort: Pulmonary effort is normal. No respiratory distress.  Abdominal:     Palpations: Abdomen is soft.     Tenderness: There is no abdominal tenderness.  Musculoskeletal:     Right lower leg: Edema present.     Left lower leg: Edema present.  Skin:    General: Skin is warm.  Neurological:     Mental Status: She is alert and oriented to person, place, and time. Mental status is at baseline.  Psychiatric:        Mood and Affect: Mood normal.    ED  Results / Procedures / Treatments   Labs (all labs ordered are listed, but only abnormal results are displayed) Labs Reviewed  CBC WITH DIFFERENTIAL/PLATELET - Abnormal; Notable for the following components:      Result Value   RDW 16.4 (*)    All other components within normal limits  COMPREHENSIVE METABOLIC PANEL - Abnormal; Notable for the following components:   Chloride 114 (*)    Glucose, Bld 103 (*)    Creatinine, Ser 1.63 (*)    Calcium 8.5 (*)    Total Protein 5.5 (*)    Albumin 3.0 (*)    ALT 49 (*)    GFR, Estimated 33 (*)    All other components within normal limits  BRAIN NATRIURETIC PEPTIDE - Abnormal; Notable for the following  components:   B Natriuretic Peptide 1,828.7 (*)    All other components within normal limits  TROPONIN I (HIGH SENSITIVITY) - Abnormal; Notable for the following components:   Troponin I (High Sensitivity) 122 (*)    All other components within normal limits  MAGNESIUM  TROPONIN I (HIGH SENSITIVITY)    EKG EKG Interpretation  Date/Time:  Sunday Nov 13 2021 18:35:41 EDT Ventricular Rate:  80 PR Interval:    QRS Duration: 111 QT Interval:  389 QTC Calculation: 414 R Axis:   77 Text Interpretation: Atrial flutter Paired ventricular premature complexes Probable lateral infarct, age indeterminate ST elevation, consider inferior injury Confirmed by Lavenia Atlas [phone]) on 11/13/2021 6:42:42 PM  Radiology DG Chest Port 1 View  Result Date: 11/13/2021 CLINICAL DATA:  Shortness of breath. EXAM: PORTABLE CHEST 1 VIEW COMPARISON:  Chest radiograph 07/19/2021. FINDINGS: Enlarged cardiac silhouette. Diffuse mild interstitial prominence. Left basilar opacities. No definite pleural effusions or visible pneumothorax. Biapical pleuroparenchymal scarring. No displaced fracture. IMPRESSION: 1. Left basilar atelectasis and/or consolidation. 2. Mild diffuse interstitial prominence could represent mild interstitial edema or the sequela of recurrent bouts of CHF. 3. Cardiomegaly. Electronically Signed   By: Margaretha Sheffield M.D.   On: 11/13/2021 19:14    Procedures .Critical Care Performed by: Lorelle Gibbs, DO Authorized by: Lorelle Gibbs, DO   Critical care provider statement:    Critical care time (minutes):  45   Critical care time was exclusive of:  Separately billable procedures and treating other patients   Critical care was necessary to treat or prevent imminent or life-threatening deterioration of the following conditions:  Cardiac failure   Critical care was time spent personally by me on the following activities:  Development of treatment plan with patient or surrogate, discussions  with consultants, evaluation of patient's response to treatment, examination of patient, ordering and review of laboratory studies, ordering and review of radiographic studies, ordering and performing treatments and interventions, pulse oximetry, re-evaluation of patient's condition and review of old charts   I assumed direction of critical care for this patient from another provider in my specialty: no     Care discussed with: admitting provider      Medications Ordered in ED Medications  furosemide (LASIX) injection 40 mg (has no administration in time range)  diltiazem (CARDIZEM) 125 mg in dextrose 5% 125 mL (1 mg/mL) infusion (has no administration in time range)    ED Course/ Medical Decision Making/ A&P                           Medical Decision Making Amount and/or Complexity of Data Reviewed Labs: ordered. Radiology: ordered.  Risk Prescription  drug management.   72 year old female presents emergency department shortness of breath and palpitations.  Recent cocaine use.  Known history of CHF, sounds like noncompliance with medications including Lasix.  Vitals are stable on arrival, EKG shows new atrial flutter, currently rate controlled, blood pressure stable.  From what I can gather from chart review this is a new diagnosis for the patient.  She is not on any anticoagulation.  Chest x-ray shows pulmonary congestion, consistent with CHF findings.  She has pitting edema in the lower extremities.  Blood work shows a baseline AKI, troponin is elevated but baseline.  BNP is elevated and closer to the numbers of when she has exacerbations.  Up until now the patient's rate had been controlled but she is now more consistently above 100 in regards to heart rate.  Cardizem drip ordered along with a dose of Lasix for diuresis.  No active chest pain, no other ischemic changes on the EKG.  Patient will require admission for new onset atrial flutter with CHF exacerbation in the setting of cocaine  use.  Patients evaluation and results requires admission for further treatment and care.  Spoke with hospitalist, reviewed patient's ED course and they accept admission.  Patient agrees with admission plan, offers no new complaints and is stable/unchanged at time of admit.        Final Clinical Impression(s) / ED Diagnoses Final diagnoses:  None    Rx / DC Orders ED Discharge Orders     None         Lorelle Gibbs, DO 11/13/21 2101

## 2021-11-13 NOTE — Assessment & Plan Note (Signed)
Chronic ongoing

## 2021-11-13 NOTE — Assessment & Plan Note (Signed)
Will replace ?

## 2021-11-13 NOTE — ED Notes (Signed)
CRITICAL TROPONIN REPORTED BY LAB TO THIS RN:  TROPONIN 91  Horton MD made aware.

## 2021-11-13 NOTE — Assessment & Plan Note (Addendum)
-   Pt diagnosed with CHF based on presence of the following:  rales on exam,  cardiomegaly, Pulmonary edema on CXR, and  bilateral leg edema, DOE, tachycardia (hr>120) With noted response to IV diuretic in ER  admit on telemetry,  cycle cardiac enzymes, Troponin 122 89   obtain serial ECG  to evaluate for ischemia as a cause of heart failure  monitor daily weight: There were no vitals filed for this visit. Last BNP BNP (last 3 results) Recent Labs    07/17/21 2206 07/20/21 0542 11/13/21 1843  BNP 2,729.9* 509.0* 1,828.7*       diurese with IV lasix and monitor orthostatics and creatinine to avoid over diuresis.  Order echogram to evaluate EF and valves  ACE/ARBi Contraindicated given rising creatinine hold [facility]   cardiology consulted

## 2021-11-14 ENCOUNTER — Inpatient Hospital Stay (HOSPITAL_COMMUNITY): Payer: Medicare Other

## 2021-11-14 DIAGNOSIS — Z515 Encounter for palliative care: Secondary | ICD-10-CM

## 2021-11-14 DIAGNOSIS — I5043 Acute on chronic combined systolic (congestive) and diastolic (congestive) heart failure: Secondary | ICD-10-CM | POA: Diagnosis not present

## 2021-11-14 DIAGNOSIS — G9341 Metabolic encephalopathy: Secondary | ICD-10-CM | POA: Diagnosis present

## 2021-11-14 DIAGNOSIS — K7460 Unspecified cirrhosis of liver: Secondary | ICD-10-CM | POA: Diagnosis not present

## 2021-11-14 LAB — COMPREHENSIVE METABOLIC PANEL
ALT: 51 U/L — ABNORMAL HIGH (ref 0–44)
AST: 45 U/L — ABNORMAL HIGH (ref 15–41)
Albumin: 3.1 g/dL — ABNORMAL LOW (ref 3.5–5.0)
Alkaline Phosphatase: 80 U/L (ref 38–126)
Anion gap: 12 (ref 5–15)
BUN: 21 mg/dL (ref 8–23)
CO2: 20 mmol/L — ABNORMAL LOW (ref 22–32)
Calcium: 8.6 mg/dL — ABNORMAL LOW (ref 8.9–10.3)
Chloride: 111 mmol/L (ref 98–111)
Creatinine, Ser: 1.63 mg/dL — ABNORMAL HIGH (ref 0.44–1.00)
GFR, Estimated: 33 mL/min — ABNORMAL LOW (ref >60)
Glucose, Bld: 138 mg/dL — ABNORMAL HIGH (ref 70–99)
Potassium: 4.3 mmol/L (ref 3.5–5.1)
Sodium: 143 mmol/L (ref 135–145)
Total Bilirubin: 0.8 mg/dL (ref 0.3–1.2)
Total Protein: 5.7 g/dL — ABNORMAL LOW (ref 6.5–8.1)

## 2021-11-14 LAB — URINALYSIS, COMPLETE (UACMP) WITH MICROSCOPIC
Bilirubin Urine: NEGATIVE
Glucose, UA: NEGATIVE mg/dL
Hgb urine dipstick: NEGATIVE
Ketones, ur: NEGATIVE mg/dL
Leukocytes,Ua: NEGATIVE
Nitrite: NEGATIVE
Protein, ur: NEGATIVE mg/dL
Specific Gravity, Urine: 1.006 (ref 1.005–1.030)
pH: 5 (ref 5.0–8.0)

## 2021-11-14 LAB — BLOOD GAS, ARTERIAL
Acid-base deficit: 0.9 mmol/L (ref 0.0–2.0)
Bicarbonate: 25.8 mmol/L (ref 20.0–28.0)
O2 Saturation: 99.9 %
Patient temperature: 37
pCO2 arterial: 50 mmHg — ABNORMAL HIGH (ref 32–48)
pH, Arterial: 7.32 — ABNORMAL LOW (ref 7.35–7.45)
pO2, Arterial: 307 mmHg — ABNORMAL HIGH (ref 83–108)

## 2021-11-14 LAB — HEPARIN LEVEL (UNFRACTIONATED)
Heparin Unfractionated: 0.38 IU/mL (ref 0.30–0.70)
Heparin Unfractionated: 0.34 IU/mL (ref 0.30–0.70)

## 2021-11-14 LAB — CBC
HCT: 45.3 % (ref 36.0–46.0)
Hemoglobin: 14.5 g/dL (ref 12.0–15.0)
MCH: 27.9 pg (ref 26.0–34.0)
MCHC: 32 g/dL (ref 30.0–36.0)
MCV: 87.3 fL (ref 80.0–100.0)
Platelets: 151 10*3/uL (ref 150–400)
RBC: 5.19 MIL/uL — ABNORMAL HIGH (ref 3.87–5.11)
RDW: 16.1 % — ABNORMAL HIGH (ref 11.5–15.5)
WBC: 6.1 10*3/uL (ref 4.0–10.5)
nRBC: 0.3 % — ABNORMAL HIGH (ref 0.0–0.2)

## 2021-11-14 LAB — MAGNESIUM: Magnesium: 1.7 mg/dL (ref 1.7–2.4)

## 2021-11-14 LAB — RAPID URINE DRUG SCREEN, HOSP PERFORMED
Amphetamines: NOT DETECTED
Barbiturates: NOT DETECTED
Benzodiazepines: NOT DETECTED
Cocaine: POSITIVE — AB
Opiates: NOT DETECTED
Tetrahydrocannabinol: NOT DETECTED

## 2021-11-14 LAB — PREALBUMIN: Prealbumin: 20.1 mg/dL (ref 18–38)

## 2021-11-14 LAB — CREATININE, URINE, RANDOM: Creatinine, Urine: 25.31 mg/dL

## 2021-11-14 LAB — ETHANOL: Alcohol, Ethyl (B): <10 mg/dL (ref <10)

## 2021-11-14 LAB — OSMOLALITY, URINE: Osmolality, Ur: 340 mOsm/kg (ref 300–900)

## 2021-11-14 LAB — SODIUM, URINE, RANDOM: Sodium, Ur: 123 mmol/L

## 2021-11-14 LAB — AMMONIA: Ammonia: 21 umol/L (ref 9–35)

## 2021-11-14 LAB — TROPONIN I (HIGH SENSITIVITY): Troponin I (High Sensitivity): 116 ng/L (ref <18)

## 2021-11-14 MED ORDER — FLUMAZENIL 0.5 MG/5ML IV SOLN
0.2000 mg | Freq: Once | INTRAVENOUS | Status: AC
  Administered 2021-11-14: 0.2 mg via INTRAVENOUS
  Filled 2021-11-14: qty 5

## 2021-11-14 MED ORDER — LORAZEPAM 1 MG PO TABS
1.0000 mg | ORAL_TABLET | Freq: Once | ORAL | Status: AC
  Administered 2021-11-14: 1 mg via ORAL
  Filled 2021-11-14: qty 1

## 2021-11-14 MED ORDER — THIAMINE HCL 100 MG/ML IJ SOLN
100.0000 mg | Freq: Every day | INTRAMUSCULAR | Status: DC
  Administered 2021-11-14 – 2021-11-18 (×5): 100 mg via INTRAVENOUS
  Filled 2021-11-14 (×5): qty 2

## 2021-11-14 NOTE — Progress Notes (Incomplete)
Heart Failure Stewardship Pharmacist Progress Note   PCP: Pcp, No PCP-Cardiologist: None    HPI:  71 yo F with PMH of CKD, cirrhosis of liver, COPD on chronic O2, bipolar, cocaine use, CHF who presented to [facility] ED 05/28 with dyspnea and LEE over past 3 days and reported continued cocaine use right before EMS arrival.  CXR in ED showed mild diffuse interstitial prominence, indicative of either mild interstitial edema or sequela of CHF exacerbations.   Current HF Medications: {HF MEDS CURRENT:26665}  Prior to admission HF Medications: Not taking any PTA medications - most Rx's last filled in February. Entresto filled in March and April.  Prescribed the following -  Diuretic: Lasix 40 mg daily ACE/ARB/ARNI: Entresto 24-26 mg BID Aldosterone Antagonist: Spironolactone 12.5 mg daily  Other: Corlanor 5 mg BID  Pertinent Lab Values: Serum creatinine ***, BUN ***, Potassium ***, Sodium ***, BNP 1828, Magnesium ***, A1c ***, Digoxin ***  Creatinine, Ser  Date Value Ref Range Status  11/14/2021 1.63 (H) 0.44 - 1.00 mg/dL Final  42/59/5638 7.56 (H) 0.44 - 1.00 mg/dL Final   BUN  Date Value Ref Range Status  11/14/2021 21 8 - 23 mg/dL Final  43/32/9518 22 8 - 23 mg/dL Final   Potassium  Date Value Ref Range Status  11/14/2021 4.3 3.5 - 5.1 mmol/L Final  11/13/2021 4.0 3.5 - 5.1 mmol/L Final   Sodium  Date Value Ref Range Status  11/14/2021 143 135 - 145 mmol/L Final  11/13/2021 144 135 - 145 mmol/L Final   B Natriuretic Peptide  Date Value Ref Range Status  11/13/2021 1,828.7 (H) 0.0 - 100.0 pg/mL Final    Comment:    Performed at [facility] Lab, [address] [place], [place] [address]  07/20/2021 509.0 (H) 0.0 - 100.0 pg/mL Final    Comment:    Performed at [facility], [address] [place], [place] [address]   Magnesium  Date Value Ref Range Status  11/14/2021 1.7 1.7 - 2.4 mg/dL Final    Comment:    Performed at [facility], [address]  [place], [place] [address]  11/13/2021 1.8 1.7 - 2.4 mg/dL Final    Comment:    Performed at [facility], [address], [place], [place] [address]    Vital Signs: Weight: *** lbs (admission weight: 188 lbs) Blood pressure: ***  Heart rate: ***  I/O: -***L yesterday; net -***L  Net IO Since Admission: 468.58 mL [11/14/21 0942]   Temp:  [97.3 F (36.3 C)-98.1 F (36.7 C)] 97.3 F (36.3 C) (05/29 0724) Pulse Rate:  [44-115] 95 (05/29 0724) Resp:  [16-23] 18 (05/29 0804) BP: (92-124)/(56-89) 124/86 (05/29 0804) SpO2:  [93 %-100 %] 96 % (05/29 0724) Weight:  [75.5 kg (166 lb 7.2 oz)-85.6 kg (188 lb 12.8 oz)] 85.6 kg (188 lb 12.8 oz) (05/29 0423) Filed Weights   11/13/21 2200 11/14/21 0037 11/14/21 0423  Weight: 75.5 kg (166 lb 7.2 oz) 85.3 kg (188 lb) 85.6 kg (188 lb 12.8 oz)    Intake/Output Summary (Last 24 hours) at 11/14/2021 7322 Last data filed at 11/14/2021 0400 Gross per 24 hour  Intake 468.58 ml  Output --  Net 468.58 ml      Medication Assistance / Insurance Benefits Check: Does the patient have prescription insurance?  {Yes/No/Pending:24180} Type of insurance plan: ***  Does the patient qualify for medication assistance through manufacturers or grants?   {Yes/No/Pending:24180} Eligible grants and/or patient assistance  programs: *** Medication assistance applications in progress: ***  Medication assistance applications approved: *** Approved medication assistance renewals will be completed by: ***  Outpatient Pharmacy:  Prior to admission outpatient pharmacy: *** Is the patient willing to use [facility] TOC pharmacy at discharge? {Yes/No/Pending:24180} Is the patient willing to transition their outpatient pharmacy to utilize a [facility] outpatient pharmacy?   {Yes/No/Pending:24180}    Assessment: 1. Acute on ***chronic ***systolic CHF (LVEF ***%), due to ***. NYHA class *** symptoms. - {HF MEDS ASSESS:27516::"***"}   Plan: 1) Medication changes recommended  at this time: -  2) Patient assistance: -  3)  Education  - To be completed prior to discharge  Filbert Schilder, PharmD PGY1 Pharmacy Resident 11/14/2021  9:42 AM

## 2021-11-14 NOTE — Progress Notes (Signed)
OT Cancellation Note  Patient Details Name: Valerie Rodgers MRN: [id number] DOB: [birth year]   Cancelled Treatment:    Reason Eval/Treat Not Completed: Patient's level of consciousness. Pt given Ativan at 5am, Rapid called at 8am for increase lethargy/difficult to arouse. RN states that pt is still very lethargic and minimally waking to respond. OT will follow up as pt becomes medically ready to participate in therapies.   Nnenna Meador Elane Andrue Dini 11/14/2021, 1:53 PM

## 2021-11-14 NOTE — Progress Notes (Addendum)
PROGRESS NOTE    Valerie Rodgers  [id number] DOB: 02/13/1951 DOA: 11/13/2021 PCP: Pcp, No  71 y.o. F with hx of chronic systolic and diastolic CHF, EF [id number] with grade 3 diastolic dysfunction, COPD on 2 L home O2, polysubstance abuse, ongoing cocaine use, bipolar disorder, liver cirrhosis, CKD 3 A presented to the ED with shortness of breath and leg swelling, had been using cocaine right before EMS was called. -In the ED she was tachypneic other vitals were stable, chest x-ray noted concerns for interstitial edema,, BNP was 1828, troponin was 122, UDS was positive for cocaine and EKG noted atrial flutter -She was started on amiodarone and IV heparin along with IV Lasix -Overnight with less agitated, got 1 mg of Ativan at 5:30 AM, has been obtunded and drowsy this morning  Subjective: -Overnight with agitation, given Ativan earlier this morning  Assessment and Plan:  Acute on chronic systolic and diastolic CHF -Echo [id number] noted EF of 25-30% with grade 3 diastolic dysfunction -Myoview 2/23 negative for ischemia. Fixed small anterior and moderate inferolateral defects: attenuation versus scar. -Complicated by ongoing cocaine abuse and poor compliance with meds -Continue IV Lasix today, continue low-dose Aldactone, avoid beta-blocker -Monitor I's/O, daily weights  Elevated troponin -Likely secondary to demand ischemia, flat trend do not suspect ACS -Recent Myoview as above was negative for ischemia  Acute metabolic encephalopathy -Multifactorial, secondary to Ativan received earlier this morning for agitation, decreased metabolism with liver cirrhosis -ABG without hypercarbia, ammonia level was normal, UDS with cocaine only, check EtOH level -No evidence of acute infectious process, CBG normal -Check CT head as well, neuro exam limited by poor effort however without overt focal signs -Add thiamine, history of EtOH use  Paroxysmal atrial flutter with RVR -Cards consulted by admitting MD  overnight -Started on IV amiodarone and heparin overnight -May not be a good long-term anticoagulation candidate considering noncompliance, polysubstance abuse and liver cirrhosis  COPD, chronic respiratory failure -On 2 L home O2 at baseline, continue nebs  Liver cirrhosis -Likely secondary to EtOH and polysubstance use  CKD 3a -Stable, monitor   DVT prophylaxis: IV heparin Code Status: Full code Family Communication: No family at bedside Disposition Plan:   Consultants:  [facility] cardiology consulted on admission  Procedures:   Antimicrobials:    Objective: Vitals:   11/14/21 0115 11/14/21 0423 11/14/21 0724 11/14/21 0804  BP: (!) 96/56 92/67 110/89 124/86  Pulse: 77 79 95   Resp: 16 16 18 18   Temp: 98.1 F (36.7 C) 98 F (36.7 C) (!) 97.3 F (36.3 C)   TempSrc: Oral Oral Oral   SpO2: 97% 97% 96%   Weight:  85.6 kg    Height:        Intake/Output Summary (Last 24 hours) at 11/14/2021 1050 Last data filed at 11/14/2021 0400 Gross per 24 hour  Intake 468.58 ml  Output --  Net 468.58 ml   Filed Weights   11/13/21 2200 11/14/21 0037 11/14/21 0423  Weight: 75.5 kg 85.3 kg 85.6 kg    Examination:  General exam: Somnolent chronically ill female, laying in bed, awakens to verbal and painful stimuli, goes back to sleep, answers some questions intermittently  HEENT: Pupils are equal and reactive CVS: S1-S2, regular rhythm  Lungs: Poor air movement bilaterally Abdomen: Soft, nontender, bowel sounds present Extremities: 1+ edema  Neuro: Obtunded, moves all extremities, no localizing signs Skin: No rashes Psychiatry: Unable to assess    Data Reviewed:   CBC: Recent Labs  Lab 11/13/21  1843 11/14/21 0140  WBC 5.9 6.1  NEUTROABS 3.4  --   HGB 14.2 14.5  HCT 45.0 45.3  MCV 88.9 87.3  PLT 167 [id number]   Basic Metabolic Panel: Recent Labs  Lab 11/13/21 1843 11/13/21 2106 11/14/21 0140  NA 144  --  143  K 4.0  --  4.3  CL 114*  --  111  CO2 23  --   20*  GLUCOSE 103*  --  138*  BUN 22  --  21  CREATININE 1.63*  --  1.63*  CALCIUM 8.5*  --  8.6*  MG 1.8  --  1.7  PHOS  --  2.1*  --    GFR: Estimated Creatinine Clearance: 33 mL/min (A) (by C-G formula based on SCr of 1.63 mg/dL (H)). Liver Function Tests: Recent Labs  Lab 11/13/21 1843 11/14/21 0140  AST 41 45*  ALT 49* 51*  ALKPHOS 80 80  BILITOT 0.8 0.8  PROT 5.5* 5.7*  ALBUMIN 3.0* 3.1*   No results for input(s): LIPASE, AMYLASE in the last 168 hours. Recent Labs  Lab 11/13/21 2307  AMMONIA 21   Coagulation Profile: Recent Labs  Lab 11/13/21 2307  INR 1.2   Cardiac Enzymes: Recent Labs  Lab 11/13/21 2106  CKTOTAL 96   BNP (last 3 results) No results for input(s): PROBNP in the last 8760 hours. HbA1C: No results for input(s): HGBA1C in the last 72 hours. CBG: No results for input(s): GLUCAP in the last 168 hours. Lipid Profile: No results for input(s): CHOL, HDL, LDLCALC, TRIG, CHOLHDL, LDLDIRECT in the last 72 hours. Thyroid Function Tests: Recent Labs    11/13/21 1843  TSH 3.570   Anemia Panel: No results for input(s): VITAMINB12, FOLATE, FERRITIN, TIBC, IRON, RETICCTPCT in the last 72 hours. Urine analysis:    Component Value Date/Time   COLORURINE STRAW (A) 11/14/2021 0458   APPEARANCEUR CLEAR 11/14/2021 0458   LABSPEC 1.006 11/14/2021 0458   PHURINE 5.0 11/14/2021 0458   GLUCOSEU NEGATIVE 11/14/2021 0458   HGBUR NEGATIVE 11/14/2021 0458   BILIRUBINUR NEGATIVE 11/14/2021 0458   KETONESUR NEGATIVE 11/14/2021 0458   PROTEINUR NEGATIVE 11/14/2021 0458   NITRITE NEGATIVE 11/14/2021 0458   LEUKOCYTESUR NEGATIVE 11/14/2021 0458   Sepsis Labs: @LABRCNTIP (procalcitonin:4,lacticidven:4)  )No results found for this or any previous visit (from the past 240 hour(s)).   Radiology Studies: DG Chest Port 1 View  Result Date: 11/13/2021 CLINICAL DATA:  Shortness of breath. EXAM: PORTABLE CHEST 1 VIEW COMPARISON:  Chest radiograph 07/19/2021.  FINDINGS: Enlarged cardiac silhouette. Diffuse mild interstitial prominence. Left basilar opacities. No definite pleural effusions or visible pneumothorax. Biapical pleuroparenchymal scarring. No displaced fracture. IMPRESSION: 1. Left basilar atelectasis and/or consolidation. 2. Mild diffuse interstitial prominence could represent mild interstitial edema or the sequela of recurrent bouts of CHF. 3. Cardiomegaly. Electronically Signed   By: Margaretha Sheffield M.D.   On: 11/13/2021 19:14     Scheduled Meds:  flumazenil  0.2 mg Intravenous Once   fluticasone furoate-vilanterol  1 puff Inhalation Daily   furosemide  40 mg Intravenous Q12H   nicotine  21 mg Transdermal Daily   spironolactone  12.5 mg Oral Daily   Continuous Infusions:  amiodarone 30 mg/hr (11/14/21 0456)   heparin 1,000 Units/hr (11/14/21 0400)     LOS: 1 day    Time spent: 51min   Domenic Polite, MD [facility]   11/14/2021, 10:50 AM

## 2021-11-14 NOTE — Progress Notes (Signed)
Nutrition Consult - Brief Note  Received nutrition consult for diet education. Patient had a rapid response event this morning and she has been lethargic and sleepy since. She is currently out of her room for CT scan. She is not appropriate for diet education at this time. RD to attach CHF diet education handout to D/C instructions. Pending results of CT scan, patient may need additional nutrition intervention. RD to continue to monitor for nutrition plan of care and F/U to provide education as appropriate.   Gabriel Rainwater RD, LDN, CNSC Please refer to Amion for contact information.

## 2021-11-14 NOTE — Progress Notes (Signed)
Pt has been restless for most of shift , constantly moves and turns in bed while also removing o2. Pt A&O x 4. States that she just wants a cigarette . Offered pt to ask physician for a nicotene patch , pt agreeable. Provider notified . One time dose of ativan ordered by provider. Will continue to monitor, call bell within reach.

## 2021-11-14 NOTE — Progress Notes (Signed)
SLP Cancellation Note  Patient Details Name: LATOSHA GAYLORD MRN: [id number] DOB: 04-04-1951   Cancelled treatment:        Rapid response called at 0800 due to decreased responsiveness. Pt's RN reported pt would not be able to participate presently in swallow assessment. If she becomes more alert as day progressed please send this therapist a secure chat message.    Royce Macadamia 11/14/2021, 9:51 AM

## 2021-11-14 NOTE — Evaluation (Signed)
Physical Therapy Evaluation Patient Details Name: Valerie Rodgers MRN: [id number] DOB: [birth year] Today's Date: 11/14/2021  History of Present Illness  The pt is a 71 yo female presenting 5/28 with SOB, LE swelling, and heart palpitations. Of note, pt reports cocaine use just prior to calling EMS. Pt found to have CHF exacerbation and in afib. PMH includes: CHF, cocaine abuse, bipolar, CKD, cirrhosis of liver, COPD on chronic 2L O2.   Clinical Impression  Pt in bed upon arrival of PT, agreeable to evaluation at this time. Prior to admission the pt reports she was using a RW "sometimes" but sometimes ambulates without DME, she was otherwise unable to give consistent answers regarding PLOF or assist available from friends. The pt now presents with limitations in functional mobility, strength, power, stability, and activity tolerance due to above dx, and will continue to benefit from skilled PT to address these deficits. The pt was able to complete bed mobility with minA, but needed up to modA to complete sit-stand transfers and short bout of gait along the EOB. The pt was limited by fatigue and increased work of breathing. I was unable to get a reliable SpO2 reading while the pt was moving at this time. Although the pt did wake up for activity, she immediately returned to sleep with removal of stimulation, suspect she will continue to do better when less lethargic. If no progress in mobility or family/friends are unable to provide needed assist, may need SNF to have increased assist while she continues to work with therapy to progress independence with mobility.         Recommendations for follow up therapy are one component of a multi-disciplinary discharge planning process, led by the attending physician.  Recommendations may be updated based on patient status, additional functional criteria and insurance authorization.  Follow Up Recommendations Home health PT (if friends can provide needed assist)     Assistance Recommended at Discharge Frequent or constant Supervision/Assistance  Patient can return home with the following  A little help with walking and/or transfers;A little help with bathing/dressing/bathroom;Assistance with cooking/housework;Direct supervision/assist for medications management;Direct supervision/assist for financial management;Assist for transportation;Help with stairs or ramp for entrance    Equipment Recommendations None recommended by PT  Recommendations for Other Services       Functional Status Assessment Patient has had a recent decline in their functional status and demonstrates the ability to make significant improvements in function in a reasonable and predictable amount of time.     Precautions / Restrictions Precautions Precautions: Fall Precaution Comments: watch  O2, supposed to use O2 at baseline, pt states she has it but does not use because she doesn't know how. Restrictions Weight Bearing Restrictions: No      Mobility  Bed Mobility Overal bed mobility: Needs Assistance Bed Mobility: Supine to Sit, Sit to Supine     Supine to sit: Min assist, HOB elevated Sit to supine: Min assist, HOB elevated   General bed mobility comments: minA to complete, pt reaching with UE for bed rails with good strength    Transfers Overall transfer level: Needs assistance Equipment used: Rolling walker (2 wheels) Transfers: Sit to/from Stand Sit to Stand: Mod assist           General transfer comment: modA to power up with pt using momentum and pulling on RW despite cues for hand placement on bed. stable once in standing    Ambulation/Gait Ambulation/Gait assistance: Min assist Gait Distance (Feet): 4 Feet Assistive device: Rolling walker (  2 wheels) Gait Pattern/deviations: Step-to pattern       General Gait Details: small lateral steps along EOB with BUE support on RW. minA to steady and to manage RW     Balance Overall balance assessment:  Needs assistance Sitting-balance support: No upper extremity supported, Feet supported Sitting balance-Leahy Scale: Good     Standing balance support: Bilateral upper extremity supported, During functional activity Standing balance-Leahy Scale: Poor Standing balance comment: dependent on BUE support                             Pertinent Vitals/Pain Pain Assessment Pain Assessment: No/denies pain    Home Living Family/patient expects to be discharged to:: Private residence Living Arrangements: Other relatives;Non-relatives/Friends ("cousin and a friend") Available Help at Discharge: Friend(s);Available PRN/intermittently Type of Home: House Home Access:  (pt unclear, gives different answer each time)       Home Layout:  (pt unclear, gives different answer each time)   Additional Comments: pt unreliable historian    Prior Function Prior Level of Function : Independent/Modified Independent             Mobility Comments: pt reports she "sometimes" uses RW, sometimes nothing ADLs Comments: pt reports independence with ADLs and IADLs such as cooking     Hand Dominance        Extremity/Trunk Assessment   Upper Extremity Assessment Upper Extremity Assessment: Generalized weakness    Lower Extremity Assessment Lower Extremity Assessment: Generalized weakness    Cervical / Trunk Assessment Cervical / Trunk Assessment: Other exceptions Cervical / Trunk Exceptions: large body habitus  Communication   Communication: Expressive difficulties (mumbled and garbled at times)  Cognition Arousal/Alertness: Lethargic (initially lethargic, awakes to food) Behavior During Therapy: WFL for tasks assessed/performed Overall Cognitive Status: Impaired/Different from baseline                                 General Comments: pt unable to answer questions about house or PLOF consistently, easily distracted. follows commands well. no family present to  determine baseline        General Comments General comments (skin integrity, edema, etc.): HR to 126bpm, SpO2 difficult to get a reading, once it did was 99%. Pt with increased SOB with activity, unable to get SpO2 reading.        Assessment/Plan    PT Assessment Patient needs continued PT services  PT Problem List Decreased strength;Decreased activity tolerance;Decreased balance;Decreased mobility;Decreased coordination;Decreased cognition       PT Treatment Interventions Gait training;DME instruction;Stair training;Functional mobility training;Therapeutic activities;Therapeutic exercise;Patient/family education    PT Goals (Current goals can be found in the Care Plan section)  Acute Rehab PT Goals Patient Stated Goal: return home tonight PT Goal Formulation: With patient Time For Goal Achievement: 11/28/21 Potential to Achieve Goals: Fair    Frequency Min 3X/week        AM-PAC PT "6 Clicks" Mobility  Outcome Measure Help needed turning from your back to your side while in a flat bed without using bedrails?: A Little Help needed moving from lying on your back to sitting on the side of a flat bed without using bedrails?: A Little Help needed moving to and from a bed to a chair (including a wheelchair)?: A Little Help needed standing up from a chair using your arms (e.g., wheelchair or bedside chair)?: A Lot Help needed to walk  in hospital room?: Total (unable to walk 20 ft at this time) Help needed climbing 3-5 steps with a railing? : Total 6 Click Score: 13    End of Session Equipment Utilized During Treatment: Gait belt;Oxygen Activity Tolerance: Patient tolerated treatment well;Patient limited by fatigue Patient left: in bed;with call bell/phone within reach;with bed alarm set Nurse Communication: Mobility status PT Visit Diagnosis: Other abnormalities of gait and mobility (R26.89)    Time: 4037-5436 PT Time Calculation (min) (ACUTE ONLY): 31 min   Charges:    PT Evaluation $PT Eval Moderate Complexity: 1 Mod PT Treatments $Therapeutic Exercise: 8-22 mins        Vickki Muff, PT, DPT   Acute Rehabilitation Department Pager #: [phone]  Ronnie Derby 11/14/2021, 6:20 PM

## 2021-11-14 NOTE — Significant Event (Signed)
Rapid Response Event Note   Reason for Call :  Difficult to arouse Patient is admitted for CHF exacerbation, she was positive for Cocaine on admission  Initial Focused Assessment:  Patient will respond to painful stimuli.  She does not stay awake very long.   She is warm and dry Lung sounds decreased bases, heart tones irregular She is on an amiodarone gtt and heparin gtt.  BP 110/89  Afl 80s  RR 18  O2 sat 100% on NRB  Dr Jomarie Longs at bedside  Interventions:  ABG  7.32/50/307/25  on NRB  0.2mg  Romazicon given IV,   she had a brief time where she was alert and answering questions.  She was able to move all extremities and verbalized appropriately.  No focal deficits noted.  Weaned O2 to 2L [place]  O2 sat 95-100%   Plan of Care:     Event Summary:   MD Notified: Dr Jomarie Longs at bedside Call Time: 0744 Arrival Time: 0749 End Time: 0930  Marcellina Millin, RN

## 2021-11-14 NOTE — Plan of Care (Signed)

## 2021-11-14 NOTE — Progress Notes (Signed)
ANTICOAGULATION CONSULT NOTE - Initial Consult  Pharmacy Consult for heparin Indication: atrial fibrillation  No Known Allergies  Patient Measurements: Height: 5\' 4"  (162.6 cm) Weight: 85.6 kg (188 lb 12.8 oz) IBW/kg (Calculated) : 54.7 Heparin Dosing Weight: 71kg  Vital Signs: Temp: 97.7 F (36.5 C) (05/29 1100) Temp Source: Oral (05/29 1100) BP: 96/85 (05/29 1100) Pulse Rate: 90 (05/29 1100)  Labs: Recent Labs    11/13/21 1843 11/13/21 2106 11/13/21 2307 11/14/21 0140 11/14/21 0957  HGB 14.2  --   --  14.5  --   HCT 45.0  --   --  45.3  --   PLT 167  --   --  151  --   LABPROT  --   --  14.9  --   --   INR  --   --  1.2  --   --   HEPARINUNFRC  --   --   --   --  0.38  CREATININE 1.63*  --   --  1.63*  --   CKTOTAL  --  96  --   --   --   TROPONINIHS 122* 89* 116*  --   --      Estimated Creatinine Clearance: 33 mL/min (A) (by C-G formula based on SCr of 1.63 mg/dL (H)).   Medical History: Past Medical History:  Diagnosis Date   Arthritis    Bipolar affective disorder [facility])    Cataract     Assessment: 71 year old female with past medical history of CHF, noncompliance with medication presents emergency department with concern for shortness of breath and palpitations. Patient currently in new atrial flutter. She does not appear to be on anticoagulation prior to admit. New orders to start IV heparin. CBC appears within normal limits. Heparin level is therapeutic at 0.38. No bleeding reported.    Goal of Therapy:  Heparin level 0.3-0.7 units/ml Monitor platelets by anticoagulation protocol: Yes   Plan:  Continue heparin infusion at 1000 units/hr Check anti-Xa level in 8 hours and daily while on heparin Continue to monitor H&H and platelets  Thank you for allowing pharmacy to participate in this patient's care.  61, PharmD PGY1 Pharmacy Resident 11/14/2021 11:57 AM Check [email or web address] for unit specific pharmacy number

## 2021-11-14 NOTE — Consult Note (Signed)
CARDIOLOGY CONSULT NOTE  Patient ID: Valerie Rodgers MRN: [id number] DOB/AGE: 1951-05-23 71 y.o.  Admit date: 11/13/2021 Attending physician: Domenic Polite, MD Primary Physician:  Kathyrn Lass Outpatient Cardiologist: NA Inpatient Cardiologist: Rex Kras, DO, [facility]  Reason of consultation: Atrial flutter Referring physician: Dr. Toy Baker   Chief complaint: Shortness of breath and palpitations  HPI:  Valerie Rodgers is a 71 y.o. African-American female who presents with a chief complaint of " shortness of breath and palpitations." Her past medical history and cardiovascular risk factors include: Hypertension, COPD, bipolar disorder, recurrent cocaine abuse, noncompliance, combined systolic and diastolic heart failure, advanced age, postmenopausal female.  Patient is somnolent and wishes to sleep at the time of the encounter but provides fragmented history and portions of the HPI obtained upon reviewing the EMR.  Patient states that she has been out of her heart failure medications since March 2023 and has been noticing progressive shortness of breath and yesterday she did cocaine and shortly thereafter started experiencing palpitations which prompted this hospitalization.  She denies orthopnea or paroxysmal nocturnal dyspnea.  Review of systems positive for shortness of breath at rest and with effort related activities as well as lower extremity swelling.  Was last hospitalized in January 2023 for heart failure exacerbation and last office follow-up March 2023 as well.  EKG on arrival notes atypical atrial flutter and cardiology was consulted for further recommendations.  Patient has been placed on amiodarone for rate and rhythm strategy as well as IV heparin for thromboembolic prophylaxis.  Thyroid function within normal limits.  BNP elevated, troponins elevated but not suggestive of ACS likely due to acute heart failure exacerbation and recent cocaine use, UDS positive for cocaine.     Rapid response was called early this morning as she was difficult to arouse.  She was given Romazicon. Palliative care consult has been requested for establishing goals of care.  ALLERGIES: No Known Allergies  PAST MEDICAL HISTORY: Past Medical History:  Diagnosis Date   Arthritis    Bipolar affective disorder ([place])    Cataract     PAST SURGICAL HISTORY: Past Surgical History:  Procedure Laterality Date   ABDOMINAL HYSTERECTOMY      FAMILY HISTORY: The patient's family history includes Stroke in her father.   SOCIAL HISTORY:  The patient  reports that she has been smoking cigarettes. She has a 16.25 pack-year smoking history. She has never used smokeless tobacco. She reports current alcohol use. She reports current drug use. Drugs: Cocaine and Marijuana.  MEDICATIONS: Current Outpatient Medications  Medication Instructions   fluticasone furoate-vilanterol (BREO ELLIPTA) 200-25 MCG/ACT AEPB 1 puff, Inhalation, Daily   furosemide (LASIX) 40 mg, Oral, Daily   ivabradine (CORLANOR) 5 mg, Oral, 2 times daily with meals   Multiple Vitamin (MULTIVITAMIN WITH MINERALS) TABS tablet 1 tablet, Daily   sacubitril-valsartan (ENTRESTO) 24-26 MG 1 tablet, Oral, 2 times daily   spironolactone (ALDACTONE) 12.5 mg, Oral, Daily    REVIEW OF SYSTEMS: Review of Systems  Reason unable to perform ROS: limited due to her being sleepy (hard to carry out a conversation).  Cardiovascular:  Positive for dyspnea on exertion, leg swelling and palpitations. Negative for chest pain, orthopnea, paroxysmal nocturnal dyspnea and syncope.  Respiratory:  Positive for shortness of breath.    PHYSICAL EXAM: Temp:  [97.3 F (36.3 C)-98.1 F (36.7 C)] 97.5 F (36.4 C) (05/29 1549) Pulse Rate:  [44-115] 92 (05/29 1549) Cardiac Rhythm: Atrial flutter (05/29 0714) Resp:  [16-23] 20 (05/29 1549) BP: (  92-124)/(56-89) 115/85 (05/29 1549) SpO2:  [92 %-100 %] 92 % (05/29 1549) Weight:  [75.5 kg-85.6 kg] 85.6  kg (05/29 0423)     11/14/2021    3:49 PM 11/14/2021   11:00 AM 11/14/2021    8:04 AM  Vitals with BMI  Systolic [id number]  Diastolic 85 85 86  Pulse 92 90     Intake/Output Summary (Last 24 hours) at 11/14/2021 1707 Last data filed at 11/14/2021 1300 Gross per 24 hour  Intake 468.58 ml  Output 700 ml  Net -231.42 ml    Net IO Since Admission: -231.42 mL [11/14/21 1707]  CONSTITUTIONAL: Appears older than stated age, ill appearance, somnolent, laying in bed, awakens to verbal stimuli, does not participate in conversation, easily falls asleep.   SKIN: Skin is warm and dry. No rash noted. No cyanosis. No pallor. No jaundice HEAD: Normocephalic and atraumatic.  EYES: No scleral icterus MOUTH/THROAT: Dry mucous membranes, poor oral dentition NECK: No JVD present. No thyromegaly noted. No carotid bruits  CHEST Normal respiratory effort. No intercostal retractions  LUNGS: Decreased breath sounds bilaterally likely due to poor effort, auscultated anteriorly only due to patient preference CARDIOVASCULAR: Irregularly irregular, variable S1-S2, no murmurs rubs or gallops appreciated ABDOMINAL: Obese, soft, nontender, nondistended, positive bowel sounds in all 4 quadrants, no apparent ascites.  EXTREMITIES: Bilateral +1 pitting edema, warm to touch.  HEMATOLOGIC: No significant bruising NEUROLOGIC: Somnolent, moves all 4 extremities, answers to close ended questions, nonfocal. PSYCHIATRIC: Somnolent.  RADIOLOGY: CT HEAD WO CONTRAST (5MM)  Result Date: 11/14/2021 CLINICAL DATA:  Mental status change.  Unknown cause. EXAM: CT HEAD WITHOUT CONTRAST TECHNIQUE: Contiguous axial images were obtained from the base of the skull through the vertex without intravenous contrast. RADIATION DOSE REDUCTION: This exam was performed according to the departmental dose-optimization program which includes automated exposure control, adjustment of the mA and/or kV according to patient size and/or use of  iterative reconstruction technique. COMPARISON:  None Available. FINDINGS: Brain: No evidence of acute infarction, hemorrhage, hydrocephalus, extra-axial collection or mass lesion/mass effect. Mild cerebral atrophy. Vascular: No hyperdense vessel or unexpected calcification. Skull: Normal. Negative for fracture or focal lesion. Sinuses/Orbits: No acute finding. Other: None. IMPRESSION: No acute intracranial abnormality. Electronically Signed   By: Keane Police D.O.   On: 11/14/2021 11:50   DG Chest Port 1 View  Result Date: 11/13/2021 CLINICAL DATA:  Shortness of breath. EXAM: PORTABLE CHEST 1 VIEW COMPARISON:  Chest radiograph 07/19/2021. FINDINGS: Enlarged cardiac silhouette. Diffuse mild interstitial prominence. Left basilar opacities. No definite pleural effusions or visible pneumothorax. Biapical pleuroparenchymal scarring. No displaced fracture. IMPRESSION: 1. Left basilar atelectasis and/or consolidation. 2. Mild diffuse interstitial prominence could represent mild interstitial edema or the sequela of recurrent bouts of CHF. 3. Cardiomegaly. Electronically Signed   By: Margaretha Sheffield M.D.   On: 11/13/2021 19:14    LABORATORY DATA: Lab Results  Component Value Date   WBC 6.1 11/14/2021   HGB 14.5 11/14/2021   HCT 45.3 11/14/2021   MCV 87.3 11/14/2021   PLT 151 11/14/2021    Recent Labs  Lab 11/14/21 0140  NA 143  K 4.3  CL 111  CO2 20*  BUN 21  CREATININE 1.63*  CALCIUM 8.6*  PROT 5.7*  BILITOT 0.8  ALKPHOS 80  ALT 51*  AST 45*  GLUCOSE 138*    Lipid Panel  Lab Results  Component Value Date   CHOL 171 05/24/2009   HDL 56 05/24/2009   LDLCALC 97 05/24/2009  TRIG 92 05/24/2009   CHOLHDL 3.1 Ratio 05/24/2009    BNP (last 3 results) Recent Labs    07/17/21 2206 07/20/21 0542 11/13/21 1843  BNP 2,729.9* 509.0* 1,828.7*    HEMOGLOBIN A1C No results found for: HGBA1C, MPG  Cardiac Panel (last 3 results) Recent Labs    11/13/21 1843 11/13/21 2106  11/13/21 2307  CKTOTAL  --  96  --   TROPONINIHS 122* 89* 116*     TSH Recent Labs    11/13/21 1843  TSH 3.570   Drugs of Abuse     Component Value Date/Time   LABOPIA NONE DETECTED 11/14/2021 0458   COCAINSCRNUR POSITIVE (A) 11/14/2021 0458   LABBENZ NONE DETECTED 11/14/2021 0458   AMPHETMU NONE DETECTED 11/14/2021 0458   THCU NONE DETECTED 11/14/2021 0458   LABBARB NONE DETECTED 11/14/2021 0458   CARDIAC DATABASE: EKG: 11/13/2021: Atypical atrial flutter, 80 bpm, ventricular couplet, poor R wave progression.  11/14/2021: Atrial flutter with variable conduction, 93 bpm, without underlying injury pattern.  Echocardiogram: Echocardiogram 07/18/2021:  1. Left ventricular ejection fraction, by estimation, is 25 to 30%. The  left ventricle has severely decreased function. The left ventricle  demonstrates global hypokinesis. Left ventricular diastolic parameters are  consistent with Grade III diastolic dysfunction (restrictive).   2. Right ventricular systolic function is normal. The right ventricular  size is normal.   3. Left atrial size was severely dilated.   4. The mitral valve is normal in structure. Trivial mitral valve  regurgitation. No evidence of mitral stenosis.   5. The aortic valve is normal in structure. Aortic valve regurgitation is  not visualized. No aortic stenosis is present.   6. The inferior vena cava is normal in size with greater than 50%  respiratory variability, suggesting right atrial pressure of 3 mmHg.  Personally reviewed. LV appears mildly dilated.   Stress Testing:  Nuclear stress test 07/21/2021: 1. Negative for ischemia. Fixed small anterior and moderate inferolateral defects: attenuation versus scar. 2. Global hypokinesis with marked LVE. 3. Left ventricular ejection fraction 21% 4. Non invasive risk stratification*: High   IMPRESSION & RECOMMENDATIONS: LALA BYAM is a 71 y.o. African-American female whose past medical history and  cardiovascular risk factors include: Hypertension, COPD, bipolar disorder, recurrent cocaine abuse, noncompliance, combined systolic and diastolic heart failure, advanced age, postmenopausal female.  Impression:  Atrial flutter, chronicity unknown Acute on chronic systolic and diastolic heart failure. Elevated troponins likely secondary to atrial flutter, recent cocaine use, and acute exacerbation of CHF, etc. Cocaine use COPD, on home O2. Mildly elevated transaminitis, questionable history of liver cirrhosis Acute on chronic kidney disease stage IIIb Noncompliance  Plan:  Atrial flutter with variable conduction: Chronicity unknown. Not a candidate for calcium channel blockers given her reduced LVEF. Not a candidate for beta-blockers due to recurrent cocaine use. Currently on amiodarone for rate and rhythm control -not an ideal candidate for long-term amiodarone use due to history of possible cirrhosis.  Thromboembolic prophylaxis: Currently on IV heparin drip. TSH within normal limits. CHA2DS2-VASc SCORE is 4 which correlates to 4% risk of stroke per year (CHF, HTN, age, gender).  Patient has illustrated noncompliance to medical therapy and recurrent use of cocaine despite educating her on multiple occasions.  When she is more awake and alert we will need to discuss long-term oral anticoagulation for thromboembolic prophylaxis.  This will need to be a multidisciplinary decision.  Acute on chronic systolic and diastolic heart failure: Stage C, NYHA class III Echo 06/2021 LVEF reported  [id number] grade 3 diastolic impairment. MPI 07/2021 negative for ischemia per report.  Referred to the official report for additional details. Complains of shortness of breath, lower extremity swelling, elevated BNP. Acute exacerbation precipitated by noncompliance (no meds since March 2023), cocaine use, and atrial flutter.  Given her soft blood pressures and volume overload recommend focusing on diuresis. We  will uptitrate GDMT as hemodynamics and kidney function tolerates Limited Echo w/ contrast.   Cocaine use:  Recurrent exposure Has been educated in the past multiple times regarding the importance of complete cessation  Elevated troponins: Not suggestive of ACS likely due to supply demand ischemia in the setting of CHF, atrial flutter, and cocaine use  Acute on chronic kidney disease: Monitor BUN and creatinine.  Patient is seen by palliative care to evaluate goals of care which is very appropriate given her comorbidities.     This note was created using a voice recognition software as a result there may be grammatical errors inadvertently enclosed that do not reflect the nature of this encounter. Every attempt is made to correct such errors.  Mechele Claude [facility]  Pager: [phone] Office: [phone] 11/14/2021, 5:07 PM

## 2021-11-14 NOTE — Consult Note (Signed)
Consultation Note Date: 11/14/2021   Patient Name: Valerie Rodgers  DOB: 22-Feb-1951  MRN: [id number]  Age / Sex: 71 y.o., female  PCP: Pcp, No Referring Physician: Domenic Polite, MD  Reason for Consultation: Establishing goals of care  HPI/Patient Profile: 71 y.o. female   admitted on 11/13/2021 with   past medical history of CHF, CKD, cirrhosis of the, liver COPD on chronic oxygen,Been bipolar disease, substance misuse disorder, who presented to [facility] ED on 528 with dyspnea and lower extremity edema.  CXR in ED showed mild diffuse interstitial prominence, indicative of either mild interstitial edema or sequela of CHF exacerbations.  She was tachypnea, BNP was 1828,Troponin 122 and UDS was positive for cocaine.  Currently patient is obtunded and does not have medical decision-making capacity  Patient faces treatment option decision, advanced directive decisions and anticipatory care needs.   Clinical Assessment and Goals of Care:    This NP Wadie Lessen reviewed medical records, received report from team, assessed the patient.  Patient is lethargic, nonverbal and unable to follow commands currently.  A rapid response was called at 8 AM secondary to extreme lethargy.  I then spoke to the only contact listed [name].     Concept of Palliative Care was introduced as specialized medical care for people and their families living with serious illness.  If focuses on providing relief from the symptoms and stress of a serious illness.  The goal is to improve quality of life for both the patient and the family.  [name] was able to share with me his relationship to the patient.  He verbalizes that he has "tried to help however " he could since meeting  [facility] [place] since 1996.  He is aware of her many psychosocial issues.  She lives in his house and [name] has tried to implement interventions to keep a roof  over her head.    He is aware that she has a son / Analisse Gethers, but he has not been in the patient's life as long as he can remember.  He is not aware that Jendaya has any other family.  Education was offered today regarding regarding the patient's current medical situation and her inability to make medical decisions.  Values and goals of care important to patient and family were attempted to be elicited.     Questions and concerns addressed.  Patient  encouraged to call with questions or concerns.     PMT will continue to support holistically.    At this time Caroleen Hamman is the only known individual with an established relationship with this patient who could act in good faith and reliably convey the wishes of this patient.    Patient has no advance care planning documents.  Her only contact is a "friend".  There is an estranged son but no contact for him and no other living relatives that we are aware of.  Hopefully the patient will clear cognitively over the next hours to days and be able to herself weigh in  to her important advance care planning decisions.    SUMMARY OF RECOMMENDATIONS    Code Status/Advance Care Planning: Full code   Palliative Prophylaxis:  Aspiration, Bowel Regimen, Delirium Protocol, Frequent Pain Assessment, and Oral Care  Additional Recommendations (Limitations, Scope, Preferences): Full Scope Treatment  Psycho-social/Spiritual:  Desire for further Chaplaincy support:no   Prognosis:  Unable to determine  Discharge Planning: To Be Determined      Primary Diagnoses: Present on Admission:  Acute on chronic combined systolic and diastolic CHF (congestive heart failure) (HCC)  Elevated troponin level not due myocardial infarction  Cocaine abuse (HCC)  Bipolar disorder (HCC)  Chronic kidney disease, stage 3a (HCC)  Cirrhosis of liver (HCC)  Atrial flutter, paroxysmal (HCC)  Hypophosphatemia  Acute on chronic respiratory failure with hypoxia  ([place])  Tobacco abuse   I have reviewed the medical record, interviewed the patient and family, and examined the patient. The following aspects are pertinent.  Past Medical History:  Diagnosis Date   Arthritis    Bipolar affective disorder ([place])    Cataract    Social History   Socioeconomic History   Marital status: Widowed    Spouse name: Not on file   Number of children: 1   Years of education: Not on file   Highest education level: Not on file  Occupational History   Not on file  Tobacco Use   Smoking status: Some Days    Packs/day: 0.25    Years: 65.00    Pack years: 16.25    Types: Cigarettes   Smokeless tobacco: Never  Vaping Use   Vaping Use: Never used  Substance and Sexual Activity   Alcohol use: Yes    Comment: 1 pint weekly   Drug use: Yes    Types: Cocaine, Marijuana   Sexual activity: Not on file  Other Topics Concern   Not on file  Social History Narrative   Not on file   Social Determinants of Health   Financial Resource Strain: Not on file  Food Insecurity: Not on file  Transportation Needs: Not on file  Physical Activity: Not on file  Stress: Not on file  Social Connections: Not on file   Family History  Problem Relation Age of Onset   Stroke Father    Colon cancer Neg Hx    Heart disease Neg Hx    Scheduled Meds:  fluticasone furoate-vilanterol  1 puff Inhalation Daily   furosemide  40 mg Intravenous Q12H   nicotine  21 mg Transdermal Daily   spironolactone  12.5 mg Oral Daily   Continuous Infusions:  amiodarone 30 mg/hr (11/14/21 0456)   heparin 1,000 Units/hr (11/14/21 0400)   PRN Meds:.acetaminophen **OR** acetaminophen Medications Prior to Admission:  Prior to Admission medications   Medication Sig Start Date End Date Taking? Authorizing Provider  fluticasone furoate-vilanterol (BREO ELLIPTA) 200-25 MCG/ACT AEPB Inhale 1 puff into the lungs daily. Patient not taking: Reported on 11/13/2021 08/18/21   Lawerance Cruel C, PA-C   furosemide (LASIX) 40 MG tablet Take 1 tablet (40 mg total) by mouth daily. Patient not taking: Reported on 11/13/2021 07/23/21   Domenic Polite, MD  ivabradine (CORLANOR) 5 MG TABS tablet Take 1 tablet (5 mg total) by mouth 2 (two) times daily with a meal. Patient not taking: Reported on 11/13/2021 08/04/21   Lawerance Cruel C, PA-C  Multiple Vitamin (MULTIVITAMIN WITH MINERALS) TABS tablet Take 1 tablet by mouth daily. Patient not taking: Reported on 11/13/2021    [provider]  sacubitril-valsartan (ENTRESTO) 24-26 MG Take 1 tablet by mouth 2 (two) times daily. Patient not taking: Reported on 11/13/2021 08/04/21   Alethia Berthold, PA-C  spironolactone (ALDACTONE) 25 MG tablet Take 0.5 tablets (12.5 mg total) by mouth daily. Patient not taking: Reported on 11/13/2021 08/18/21 08/13/22  Lawerance Cruel C, PA-C   No Known Allergies Review of Systems  Unable to perform ROS: Mental status change   Physical Exam Constitutional:      Appearance: She is ill-appearing.  Cardiovascular:     Rate and Rhythm: Normal rate.  Pulmonary:     Effort: Pulmonary effort is normal.  Skin:    General: Skin is warm and dry.  Neurological:     Mental Status: She is lethargic.    Vital Signs: BP 124/86   Pulse 95   Temp (!) 97.3 F (36.3 C) (Oral)   Resp 18   Ht 5\' 4"  (1.626 m)   Wt 85.6 kg   SpO2 96%   BMI 32.41 kg/m  Pain Scale: 0-10   Pain Score: 0-No pain   SpO2: SpO2: 96 % O2 Device:SpO2: 96 % O2 Flow Rate: .O2 Flow Rate (L/min): 2 L/min  IO: Intake/output summary:  Intake/Output Summary (Last 24 hours) at 11/14/2021 [id number] Last data filed at 11/14/2021 0400 Gross per 24 hour  Intake 468.58 ml  Output --  Net 468.58 ml    LBM: Last BM Date : 11/13/21 Baseline Weight: Weight: 75.5 kg (recent weight) Most recent weight: Weight: 85.6 kg     Palliative Assessment/Data:     Discussed with Dr. Broadus John and treatment team reached via secure chat  Signed by: Wadie Lessen,  NP   Please contact Palliative Medicine Team phone at [phone] for questions and concerns.  For individual provider: See Shea Evans

## 2021-11-14 NOTE — Progress Notes (Signed)
ANTICOAGULATION CONSULT NOTE - Initial Consult  Pharmacy Consult for heparin Indication: atrial fibrillation  No Known Allergies  Patient Measurements: Height: 5\' 4"  (162.6 cm) Weight: 85.6 kg (188 lb 12.8 oz) IBW/kg (Calculated) : 54.7 Heparin Dosing Weight: 71kg  Vital Signs: Temp: 97.5 F (36.4 C) (05/29 1549) Temp Source: Oral (05/29 1549) BP: 115/85 (05/29 1549) Pulse Rate: 92 (05/29 1549)  Labs: Recent Labs    11/13/21 1843 11/13/21 2106 11/13/21 2307 11/14/21 0140 11/14/21 0957 11/14/21 1739  HGB 14.2  --   --  14.5  --   --   HCT 45.0  --   --  45.3  --   --   PLT 167  --   --  151  --   --   LABPROT  --   --  14.9  --   --   --   INR  --   --  1.2  --   --   --   HEPARINUNFRC  --   --   --   --  0.38 0.34  CREATININE 1.63*  --   --  1.63*  --   --   CKTOTAL  --  96  --   --   --   --   TROPONINIHS 122* 89* 116*  --   --   --      Estimated Creatinine Clearance: 33 mL/min (A) (by C-G formula based on SCr of 1.63 mg/dL (H)).   Medical History: Past Medical History:  Diagnosis Date   Arthritis    Bipolar affective disorder [facility])    Cataract     Assessment: 71 year old female with past medical history of CHF, noncompliance with medication presents emergency department with concern for shortness of breath and palpitations. Patient currently in new atrial flutter. She does not appear to be on anticoagulation prior to admit. Pharmacy consulted for heparin.   Most recent heparin level continues to be therapeutic at 0.34 on heparin 1000 units/hr. CBC is within normal limits. No issues noted per chart review.   Goal of Therapy:  Heparin level 0.3-0.7 units/ml Monitor platelets by anticoagulation protocol: Yes   Plan:  Continue heparin infusion at 1000 units/hr Daily heparin level and CBC Monitor for s/sx of bleeding  Thank you for allowing pharmacy to participate in this patient's care.  Zenaida Deed, PharmD PGY1 Acute Care Pharmacy Resident   Phone: [phone] 11/14/2021  7:05 PM  Please check [email or web address] for unit-specific pharmacy phone numbers.

## 2021-11-14 NOTE — Discharge Instructions (Signed)
Heart Failure Nutrition Therapy  This nutrition therapy will help you feel better and support your heart.  This plan focuses on: Limiting sodium in your diet. Salt (sodium) makes your body hold water. When your body holds too much water, you can feel shortness of breath and swelling. You can prevent these symptoms by eating less salt. Limiting fluid in your diet. For some patients, drinking too much fluid can make heart failure worse. It can cause symptoms such as shortness of breath and swelling. Limiting fluids can help relieve some of your symptoms. Managing your weight. Your registered dietitian nutritionist (RDN) can help you choose a healthy weight for your body type. You can achieve these goals by: Reading food labels to keep track of how much sodium is in the foods you eat. Limiting foods that are high in sodium. Checking your weight to make sure you're not retaining too much fluid. Reading the Food Label: How Much Sodium Is Too Much? The nutrition plan for heart failure usually limits the sodium you get from food and drinks to 2,000 milligrams per day. Salt is the main source of sodium. Read the nutrition label to find out how much sodium is in 1 serving of a food. Select foods with 140 milligrams of sodium or less per serving. Foods with more than 300 milligrams of sodium per serving may not fit into a reduced-sodium meal plan. Check serving sizes. If you eat more than 1 serving, you will get more sodium than the amount listed. Cutting Back on Sodium Avoid processed foods. Eat more fresh foods. Fresh and frozen fruits and vegetables without added juices or sauces are naturally low in sodium. Fresh meats are lower in sodium than processed meats, such as bacon, sausage, and hot dogs. Read the nutrition label or ask your butcher to help you find a fresh meat that is low in sodium. Eat less salt, at the table and when cooking. Just 1 teaspoon of table salt has 2,300 milligrams of  sodium. Leave the salt out of recipes for pasta, casseroles, and soups. Ask your RDN how to cook your favorite recipes without sodium. Be a Engineer, building services. Look for food packages that say "salt-free" or "sodium-free." These items contain less than 5 milligrams of sodium per serving. "Very-low-sodium" products contain less than 35 milligrams of sodium per serving. "Low-sodium" products contain less than 140 milligrams of sodium per serving. "Unsalted" or "no added salt" products may still be high in sodium. Check the nutrition label. Add flavors to your food without adding sodium. Try lemon juice, lime juice, fruit juice, or vinegar. Dry or fresh herbs add flavor. Try basil, bay leaf, dill, rosemary, parsley, sage, dry mustard, nutmeg, thyme, and paprika. Pepper, red pepper flakes, and cayenne pepper can add spice to your meals without adding sodium. Hot sauce contains sodium, but if you use just a drop or two, it will not add up to much. Buy a sodium-free seasoning blend or make your own at home. Use caution when you eat outside your home. Restaurant foods can be very high in sodium. Ask for nutrition information. Many restaurants provide nutrition facts on their menus or websites. Let your server know that you want your food to be cooked without salt. Ask for your salad dressing and sauces to come "on the side." Fluid Restriction Your doctor may ask you to follow a fluid restriction in addition to taking diuretics (water pills). Ask your doctor how much fluid you can have. Foods that are liquid at room  temperature are considered a fluid, such as popsicles, soup, ice cream, and Jell-O. Here are some common conversions that will help you measure your fluid intake every day: 1,000 milliliters = 1 liter or 4 cups 1 fluid ounce = 30 milliliters  1 cup = 240 milliliters 2,000 milliliters = 2 liters or 8 cups  1,500 milliliters = 1 liters or 6 cups    Weight Monitoring Weigh yourself each day.  Sudden weight gain is a sign that fluid is building up in your body. Follow these guidelines: Weigh yourself every morning. If you gain 3 or more pounds in 1-2 days or 5 or more pounds within 1 week, call your doctor. Your doctor may adjust your medicine to get rid of the extra fluid. Talk with your doctor or RDN about what a healthy weight is for you. Talk with your doctor to find out what type of physical activity is best for you.  Foods Recommended Food Group Recommended Foods  Grains Bread with less than 80 milligrams sodium per slice (yeast breads usually have less sodium than those made with baking soda) Homemade bread made with reduced-sodium baking soda Many cold cereals, especially shredded wheat and puffed rice Oats, grits, or cream of wheat Dry pastas, noodles, quinoa, and rice  Vegetables Fresh and frozen vegetables without added sauces, salt, or sodium Homemade soups (salt free or low sodium) Low-sodium or sodium-free canned vegetables and soups  Fruits Fresh and canned fruits Dried fruits, such as raisins, cranberries, and prunes  Dairy (Milk and Milk Products) Milk or milk powder Rice milk and soy milk Yogurt, including Greek yogurt Small amounts of natural, block cheese or reduced-sodium cheese (Swiss, ricotta, and fresh mozzarella are lower in sodium than others) Regular or soft cream cheese and low-sodium cottage cheese  Protein Foods (Meat, Poultry, Fish, Armed forces logistics/support/administrative officer) [employer] and fish [place] bacon (except if packaged in a sodium solution) Canned or packed tuna (no more than 4 ounces at 1 serving) Dried beans and peas; edamame (fresh soybeans) Eggs or egg beaters (if  less than 200 mg per serving) Unsalted nuts or peanut butter  Desserts and Snacks Fresh fruit or applesauce Angel food cake Granola bars Unsalted pretzels, popcorn, or nuts Pudding or gelatin with whipped cream topping Homemade rice-crispy treats Vanilla wafers Frozen fruit bars  Fats Tub or liquid  margarine Unsaturated fat oils (canola, olive, corn, sunflower, safflower, peanut)  Condiments Fresh or dried herbs; low-sodium ketchup; vinegar; lemon or lime juice; pepper; salt-free seasoning mixes and marinades (salt-free seasoning blend); simple salad dressings (vinegar and oil); salt-free sauces   Foods Not Recommended Food Group Foods Not Recommended  Grains Breads or crackers topped with salt Cereals (hot/cold) with more than 300 milligrams sodium per serving Biscuits, cornbread, and other "quick" breads prepared with baking soda Prepackaged bread crumbs Self-rising flours  Vegetables Canned vegetables (unless they are salt free or low sodium) Frozen vegetables with seasoning and sauces Sauerkraut and pickled vegetables Canned or dried soups (unless they are salt free or low  sodium) [place] fries and onion rings  Fruits Dried fruits preserved with sodium-containing additives  Dairy (Milk and Milk Products) Buttermilk Processed cheeses  Cottage cheese (unless a low-sodium variety) Feta cheese; shredded cheese (has more sodium than block cheese); "singles" slices and string cheese  Protein Foods (Meat, Poultry, Fish, Beans) Cured meats: bacon, ham, sausage, pepperoni, and hot dogs Canned meats: chili, Vienna sausage, sardines, and ham Smoked fish and meats Frozen meals that have more than 600 milligrams sodium  Fats Salted butter or margarine  Condiments Salt, sea salt, kosher salt, onion salt, and garlic salt Seasoning mixes containing salt (Lemon Pepper or Bouillon cubes) Catsup or ketchup, BBQ sauce, Worcestershire and soy sauce Salsa, pickles, olives, relish Salad dressings: ranch, blue cheese, [place], and [place]   Alcohol Check with your doctor.   Heart Failure Sample 1-Day Menu View Nutrient Info Breakfast 1 cup regular oatmeal made with water or milk 1 cup reduced-fat (2%) milk 1 medium banana 1 slice whole wheat bread 1 tablespoon salt-free peanut butter  Morning  Snack 1/2 cup dried cranberries  Lunch 3 ounces grilled chicken breast 1 cup salad greens Olive oil and vinegar dressing (for greens) 5 unsalted or low-sodium crackers Fruit plate with 1/4 cup strawberries 1/2 sliced orange (for fruit plate) 1 peach half (for fruit plate)  Afternoon Snack 1 ounce low-sodium [place] 1 piece whole wheat bread  Evening Meal 3 ounces herb-baked fish 1 baked potato 2 teaspoons soft margarine (trans fat-free) (for potato) Sliced tomatoes 1/2 cup steamed spinach drizzled with lemon juice 3-inch square of angel food cake Fresh strawberries (2) (for cake)  Evening Snack 2 tablespoons salt-free peanut butter 5 low-sodium crackers  Daily Sum Nutrient Unit Value  Macronutrients  Energy kcal 1890  Energy kJ 7906  Protein g 95  Total lipid (fat) g 56  Carbohydrate, by difference g 270  Fiber, total dietary g 31  Sugars, total g 99  Minerals  Calcium, Ca mg 949  Iron, Fe mg 27  Sodium, Na mg 1538  Vitamins  Vitamin C, total ascorbic acid mg 118  Vitamin A, IU IU 18639  Vitamin D IU 232  Lipids  Fatty acids, total saturated g 13  Fatty acids, total monounsaturated g 22  Fatty acids, total polyunsaturated g 16  Cholesterol mg 126     Heart Failure Vegan Sample 1-Day Menu View Nutrient Info Breakfast 1 cup oatmeal  cup walnuts 1 banana 1 cup soymilk fortified with calcium, vitamin B12, and vitamin D  Lunch 1 large whole wheat pita Salad made with: 1 cup chickpeas 1 cup lettuce  cup cherry tomatoes 1 cup strawberries 1 tablespoon olive oil 1 tablespoon balsamic vinegar  Evening Meal  cup tofu 2 teaspoons olive oil Pinch garlic powder 1 baked potato 1 tablespoon margarine, soft, tub  cup cooked spinach with: Squeeze of lemon 1 cup soymilk fortified with calcium, vitamin B12, and vitamin D  Evening Snack 1 tablespoon peanut butter, without salt  ounce pretzels, without salt  Daily Sum Nutrient Unit Value  Macronutrients  Energy  kcal 1848  Energy kJ 7735  Protein g 74  Total lipid (fat) g 83  Carbohydrate, by difference g 223  Fiber, total dietary g 39  Sugars, total g 44  Minerals  Calcium, Ca mg 1325  Iron, Fe mg 20  Sodium, Na mg 1098  Vitamins  Vitamin C, total ascorbic acid mg 140  Vitamin A, IU IU 15807  Vitamin D IU 238  Lipids  Fatty acids, total saturated g 13  Fatty acids, total monounsaturated g 33  Fatty acids, total polyunsaturated g 32  Cholesterol mg 0     Heart Failure Vegetarian (Lacto-Ovo) Sample 1-Day Menu View Nutrient Info Breakfast 1 cup oatmeal  cup walnuts 1 banana 1 cup fat-free milk  Lunch 1 large whole wheat pita Salad made with:  cup chickpeas 1 ounce mozzarella cheese 1 cup lettuce  cup cherry tomatoes 1 cup strawberries 1 tablespoon olive oil 1 tablespoon balsamic vinegar  Evening Meal  cup tofu 2 teaspoons olive oil Pinch garlic powder 1 baked potato 1 tablespoon margarine, soft, tub  cup cooked spinach with: Squeeze of lemon 1 cup fat-free milk  Evening Snack 1 tablespoon peanut butter, without salt 1 apple  Daily Sum Nutrient Unit Value  Macronutrients  Energy kcal 1847  Energy kJ 7734  Protein g 76  Total lipid (fat) g 79  Carbohydrate, by difference g 231  Fiber, total dietary g 35  Sugars, total g 83  Minerals  Calcium, Ca mg 1488  Iron, Fe mg 16  Sodium, Na mg 1100  Vitamins  Vitamin C, total ascorbic acid mg 149  Vitamin A, IU IU 16116  Vitamin D IU 234  Lipids  Fatty acids, total saturated g 15  Fatty acids, total monounsaturated g 32  Fatty acids, total polyunsaturated g 26  Cholesterol mg 28    Copyright 2020  Academy of Nutrition and Dietetics. All rights reserved    Information on my medicine - ELIQUIS (apixaban)  This medication education was reviewed with me or my healthcare representative as part of my discharge preparation.    Why was Eliquis prescribed for you? Eliquis was prescribed for you to reduce the  risk of a blood clot forming that can cause a stroke if you have a medical condition called atrial fibrillation (a type of irregular heartbeat).  What do You need to know about Eliquis ? Take your Eliquis TWICE DAILY - one tablet in the morning and one tablet in the evening with or without food. If you have difficulty swallowing the tablet whole please discuss with your pharmacist how to take the medication safely.  Take Eliquis exactly as prescribed by your doctor and DO NOT stop taking Eliquis without talking to the doctor who prescribed the medication.  Stopping may increase your risk of developing a stroke.  Refill your prescription before you run out.  After discharge, you should have regular check-up appointments with your healthcare provider that is prescribing your Eliquis.  In the future your dose may need to be changed if your kidney function or weight changes by a significant amount or as you get older.  What do you do if you miss a dose? If you miss a dose, take it as soon as you remember on the same day and resume taking twice daily.  Do not take more than one dose of ELIQUIS at the same time to make up a missed dose.  Important Safety Information A possible side effect of Eliquis is bleeding. You should call your healthcare provider right away if you experience any of the following: Bleeding from an injury or your nose that does not stop. Unusual colored urine (red or dark brown) or unusual colored stools (red or black). Unusual bruising for unknown reasons. A serious fall or if you hit your head (even if there is no bleeding).  Some medicines may interact with Eliquis and might increase your risk of bleeding or clotting while on Eliquis. To help avoid this, consult your healthcare provider or pharmacist prior to using any new prescription or non-prescription medications, including herbals, vitamins, non-steroidal anti-inflammatory drugs (NSAIDs) and supplements.  This  website has more information on Eliquis (apixaban): http://[email or web address].com/eliquis/home

## 2021-11-15 ENCOUNTER — Other Ambulatory Visit (HOSPITAL_COMMUNITY): Payer: Self-pay

## 2021-11-15 ENCOUNTER — Inpatient Hospital Stay (HOSPITAL_COMMUNITY): Payer: Medicare Other

## 2021-11-15 DIAGNOSIS — F1410 Cocaine abuse, uncomplicated: Secondary | ICD-10-CM | POA: Diagnosis not present

## 2021-11-15 DIAGNOSIS — K7460 Unspecified cirrhosis of liver: Secondary | ICD-10-CM | POA: Diagnosis not present

## 2021-11-15 DIAGNOSIS — N1831 Chronic kidney disease, stage 3a: Secondary | ICD-10-CM | POA: Diagnosis not present

## 2021-11-15 DIAGNOSIS — I5043 Acute on chronic combined systolic (congestive) and diastolic (congestive) heart failure: Secondary | ICD-10-CM | POA: Diagnosis not present

## 2021-11-15 LAB — ECHOCARDIOGRAM COMPLETE
AR max vel: 2.43 cm2
AV Area VTI: 2.08 cm2
AV Area mean vel: 2.02 cm2
AV Mean grad: 4.5 mmHg
AV Peak grad: 8.1 mmHg
Ao pk vel: 1.43 m/s
Area-P 1/2: 7.44 cm2
Calc EF: 28.3 %
Height: 64 in
MV M vel: 4.41 m/s
MV Peak grad: 77.9 mmHg
P 1/2 time: 425 ms
S' Lateral: 6 cm
Single Plane A2C EF: 25.9 %
Single Plane A4C EF: 28 %
Weight: 2982.4 [oz_av]

## 2021-11-15 LAB — COMPREHENSIVE METABOLIC PANEL
ALT: 48 U/L — ABNORMAL HIGH (ref 0–44)
AST: 34 U/L (ref 15–41)
Albumin: 2.9 g/dL — ABNORMAL LOW (ref 3.5–5.0)
Alkaline Phosphatase: 71 U/L (ref 38–126)
Anion gap: 10 (ref 5–15)
BUN: 22 mg/dL (ref 8–23)
CO2: 24 mmol/L (ref 22–32)
Calcium: 8.5 mg/dL — ABNORMAL LOW (ref 8.9–10.3)
Chloride: 105 mmol/L (ref 98–111)
Creatinine, Ser: 1.61 mg/dL — ABNORMAL HIGH (ref 0.44–1.00)
GFR, Estimated: 34 mL/min — ABNORMAL LOW (ref >60)
Glucose, Bld: 127 mg/dL — ABNORMAL HIGH (ref 70–99)
Potassium: 3.7 mmol/L (ref 3.5–5.1)
Sodium: 139 mmol/L (ref 135–145)
Total Bilirubin: 0.5 mg/dL (ref 0.3–1.2)
Total Protein: 5.4 g/dL — ABNORMAL LOW (ref 6.5–8.1)

## 2021-11-15 LAB — CBC
HCT: 42.6 % (ref 36.0–46.0)
Hemoglobin: 13.6 g/dL (ref 12.0–15.0)
MCH: 27.9 pg (ref 26.0–34.0)
MCHC: 31.9 g/dL (ref 30.0–36.0)
MCV: 87.5 fL (ref 80.0–100.0)
Platelets: 169 10*3/uL (ref 150–400)
RBC: 4.87 MIL/uL (ref 3.87–5.11)
RDW: 15.9 % — ABNORMAL HIGH (ref 11.5–15.5)
WBC: 6.2 10*3/uL (ref 4.0–10.5)
nRBC: 0 % (ref 0.0–0.2)

## 2021-11-15 LAB — HEPARIN LEVEL (UNFRACTIONATED): Heparin Unfractionated: 0.3 IU/mL (ref 0.30–0.70)

## 2021-11-15 MED ORDER — CARVEDILOL 3.125 MG PO TABS
3.1250 mg | ORAL_TABLET | Freq: Two times a day (BID) | ORAL | Status: DC
  Administered 2021-11-15: 3.125 mg via ORAL
  Filled 2021-11-15: qty 1

## 2021-11-15 MED ORDER — RIVAROXABAN 15 MG PO TABS
15.0000 mg | ORAL_TABLET | Freq: Every day | ORAL | Status: DC
  Administered 2021-11-15 – 2021-11-17 (×3): 15 mg via ORAL
  Filled 2021-11-15 (×3): qty 1

## 2021-11-15 MED ORDER — ISOSORB DINITRATE-HYDRALAZINE 20-37.5 MG PO TABS
1.0000 | ORAL_TABLET | Freq: Two times a day (BID) | ORAL | Status: DC
  Administered 2021-11-15 – 2021-11-18 (×7): 1 via ORAL
  Filled 2021-11-15 (×7): qty 1

## 2021-11-15 MED ORDER — HYDROXYZINE HCL 25 MG PO TABS
25.0000 mg | ORAL_TABLET | Freq: Once | ORAL | Status: AC
  Administered 2021-11-15: 25 mg via ORAL
  Filled 2021-11-15: qty 1

## 2021-11-15 MED ORDER — AMIODARONE HCL 200 MG PO TABS
200.0000 mg | ORAL_TABLET | Freq: Every day | ORAL | Status: DC
  Administered 2021-11-15 – 2021-11-17 (×3): 200 mg via ORAL
  Filled 2021-11-15 (×3): qty 1

## 2021-11-15 NOTE — Progress Notes (Signed)
Patient ID: [name], female   DOB: 1951-02-14, 71 y.o.   MRN: [id number]    Progress Note from the Palliative Medicine Team at [facility]   Patient Name: Valerie Rodgers        Date: 11/15/2021 DOB: November 18, 1950  Age: 71 y.o. MRN#: [id number] Attending Physician: Domenic Polite, MD Primary Care Physician: Pcp, No Admit Date: 11/13/2021   Medical records reviewed   71 y.o. female   admitted on 11/13/2021 with past medical history of CHF, CKD, cirrhosis of the, liver COPD on chronic oxygen,Been bipolar disease, substance misuse disorder, who presented to [facility] ED with dyspnea and lower extremity edema.   CXR in ED showed mild diffuse interstitial prominence, indicative of either mild interstitial edema or sequela of CHF exacerbations.  She was tachypnea, BNP was 1828,Troponin 122 and UDS was positive for cocaine.   Patient faces treatment option,It is advanced directive decisions and anticipatory care needs.  This NP visited patient at the bedside as a follow up to  yesterday's [place].  Yesterday patient was obtunded,  today patient is alert and oriented x3 today.    Education offered today on her multiple co-morbidities and likely disease trajectory specific to end-stage heart failure.  She verbalizes understanding and speaks openly and honestly regarding her cocaine addiction, and its impact on her health.  Education offered to her regarding the importance of continued conversation regarding her advance care planning as it relates to living will and healthcare power of attorney.  Her main support person and only listed contact, she refers to him as her friend in [place], is [name].    She mentions that she would trust him as her healthcare power of attorney.  All of the above needs to be explored in more detail, however she is ready to have echocardiogram at bedside.  [name] agrees to further conversation with the palliative medicine team.  Questions and concerns  addressed   Discussed with Dr Newt Lukes NP  Palliative Medicine Team Team Phone # [phone] Pager [phone]

## 2021-11-15 NOTE — TOC Benefit Eligibility Note (Signed)
Patient Advocate Encounter  Insurance verification completed.    The patient is currently admitted and upon discharge could be taking Xarelto 15 mg.  The current 30 day co-pay is, $0.00.   The patient is insured through [employer] Medicare Part D     Zackerie Sara, CPhT Pharmacy Patient Advocate Specialist [facility] Patient Advocate Team Direct Number: [phone]  Fax: [phone]        

## 2021-11-15 NOTE — Progress Notes (Signed)
  Echocardiogram 2D Echocardiogram has been performed.  Valerie Rodgers 11/15/2021, 9:08 AM

## 2021-11-15 NOTE — Progress Notes (Signed)
Heart Failure Nurse Navigator Progress Note  Echo 06/2021--EF 25-30%, G3DD  Ongoing cocaine use  Followed by [facility]   Navigator available if needed  Meredith Staggers, RN, BSN, [facility] Heart Failure Navigator Heart & Vascular Care Navigation Team

## 2021-11-15 NOTE — Evaluation (Signed)
Clinical/Bedside Swallow Evaluation Patient Details  Name: ADDYSEN LOUTH MRN: [id number] Date of Birth: 04-12-1951  Today's Date: 11/15/2021 Time: SLP Start Time (ACUTE ONLY): 3875 SLP Stop Time (ACUTE ONLY): 0928 SLP Time Calculation (min) (ACUTE ONLY): 10 min  Past Medical History:  Past Medical History:  Diagnosis Date   Arthritis    Bipolar affective disorder (HCC)    Cataract    Past Surgical History:  Past Surgical History:  Procedure Laterality Date   ABDOMINAL HYSTERECTOMY     HPI:  71 yo female presenting 5/28 with SOB, LE swelling, and heart palpitations. Per chart pt reports cocaine use just prior to calling EMS. Pt found to have CHF exacerbation and in afib. PMH includes: CHF, cocaine abuse, bipolar, CKD, cirrhosis of liver, COPD on chronic 2L O2.    Assessment / Plan / Recommendation  Clinical Impression  Pt did not demonstrate concern for aspiration during assessment consuming thin, puree and solid consistencies. She was awake but intermittently falling asleep towards the end needing verbal stimuli. She has a weak cough with adequate oral-motor ROM and missing upper dentition and scattered lower. She says she states can eat solids without difficulty which she was able to demonstrate. No coughing or throat clearing present and she self fed but may need set up assist. Continue regular texture, thin liquids, straws allowed and pills with thin. No ST is needed at this time. SLP Visit Diagnosis: Dysphagia, unspecified (R13.10)    Aspiration Risk  Mild aspiration risk    Diet Recommendation Regular;Thin liquid   Liquid Administration via: Straw;Cup Medication Administration: Whole meds with liquid Supervision: Patient able to self feed Compensations: Minimize environmental distractions;Slow rate;Small sips/bites Postural Changes: Seated upright at 90 degrees    Other  Recommendations Oral Care Recommendations: Oral care BID    Recommendations for follow up therapy are  one component of a multi-disciplinary discharge planning process, led by the attending physician.  Recommendations may be updated based on patient status, additional functional criteria and insurance authorization.  Follow up Recommendations No SLP follow up      Assistance Recommended at Discharge None  Functional Status Assessment    Frequency and Duration            Prognosis        Swallow Study   General Date of Onset: 11/13/21 HPI: 71 yo female presenting 5/28 with SOB, LE swelling, and heart palpitations. Per chart pt reports cocaine use just prior to calling EMS. Pt found to have CHF exacerbation and in afib. PMH includes: CHF, cocaine abuse, bipolar, CKD, cirrhosis of liver, COPD on chronic 2L O2. Type of Study: Bedside Swallow Evaluation Previous Swallow Assessment: none Diet Prior to this Study: Regular;Thin liquids Temperature Spikes Noted: No Respiratory Status: Nasal cannula History of Recent Intubation: No Behavior/Cognition: Cooperative;Lethargic/Drowsy;Pleasant mood;Requires cueing Oral Cavity Assessment: Within Functional Limits Oral Care Completed by SLP: No Oral Cavity - Dentition: Missing dentition (no upper) Vision: Functional for self-feeding Self-Feeding Abilities: Able to feed self;Needs set up Patient Positioning: Upright in bed Baseline Vocal Quality: Normal Volitional Cough: Weak Volitional Swallow: Able to elicit    Oral/Motor/Sensory Function Overall Oral Motor/Sensory Function: Within functional limits   Ice Chips Ice chips: Not tested   Thin Liquid Thin Liquid: Within functional limits Presentation: Straw    Nectar Thick Nectar Thick Liquid: Not tested   Honey Thick Honey Thick Liquid: Not tested   Puree Puree: Within functional limits   Solid     Solid: Within functional limits  Royce Macadamia 11/15/2021,9:45 AM

## 2021-11-15 NOTE — Progress Notes (Addendum)
Heart Failure Stewardship Pharmacist Progress Note   PCP: Pcp, No PCP-Cardiologist: None    HPI:  71 yo F with PMH of CKD, cirrhosis of liver, COPD on chronic O2, bipolar, cocaine use, and systolic CHF who presented to [facility] ED 05/28 with dyspnea and LEE over past 3 days.    She was admitted 01/29 through 02/04 for an acute on chronic CHF exacerbation secondary to cocaine use.  Diuresed with net negative -1.11L at discharge weighing 160.6 lbs.  Echo on 01/30 during that admission revealed newly reduced LVEF of 25-30% with G3DD.  Stress test on 02/02 was negative for inducible ischemia with evidence of global hypokinesis and marked LVE.  She was discharged on Entresto, Corlanor, and Lasix 40 mg daily; spironolactone was initiated during hospital stay but discontinued with hyperkalemia of 5.2 (Lokelma given).  Her PTA amlodipine and HCTZ were discontinued.  Followed up with [facility] 02/16 where she admitted to continued cocaine use and reported that she was still taking the HCTZ and amlodipine and was nonadherent to Corlanor.    At 03/02 cardiology follow-up visit, she was still using cocaine but denied dyspnea, orthopnea or LEE.  Spironolactone 12.5 mg daily was added.  Still no beta-blockers with persistent cocaine use.    Now, she is admitted for another CHF exacerbation secondary to cocaine use.  CXR in the ED showed mild diffuse interstitial prominence, indicative of either mild interstitial edema or sequela of CHF exacerbations.  Echo on 05/30 revealed severely reduced EF of <20%, severely reduced RV function with elevated pulmonary pressures, moderate-severe biatrial dilation, and moderate-severe TR - all new since Jan admission.  The G3DD and elevated LVEDP remains the same.    Current HF Medications: Diuretic: furosemide IV 40 mg q12h  Prior to admission HF Medications: Not taking any PTA medications - most Rx's last filled in February. Entresto filled in March and  April.  Prescribed the following -  Diuretic: Lasix 40 mg daily ACE/ARB/ARNI: Entresto 24-26 mg BID Aldosterone Antagonist: Spironolactone 12.5 mg daily  Other: Corlanor 5 mg BID  Pertinent Lab Values: Serum creatinine 1.61 (baseline ~1.2), BUN 22, Potassium 3.7, Sodium 139, BNP 1828, Magnesium 1.7, A1c none on file  Vital Signs: Weight: 186 lbs (admission weight: 188 lbs) Blood pressure: 110-20/60-80  Heart rate: 80-90  I/O: -1.4L yesterday; net -331 mL  Medication Assistance / Insurance Benefits Check: Does the patient have prescription insurance?  Yes Type of insurance plan: UHC Medicare / Medicaid [place]:  Prior to admission outpatient pharmacy: [facility] Is the patient willing to use [facility] at discharge? Yes Is the patient willing to transition their outpatient pharmacy to utilize a [facility] outpatient pharmacy?   Pending    Assessment: 1. Acute on chronic systolic CHF (LVEF <16%), due to ongoing cocaine use. NYHA class II-III symptoms. - Continue furosemide IV 40 mg q12h - good response thus far but oxygen requirements increasing 2L > 5L .  - No beta-blocker with ongoing cocaine use and severe biventricular failure with EF <20% - Acute on chronic kidney injury limiting GDMT initiation - no MRA / SGLT2i / RAAS agent - No digoxin with AKI - would consider for RV support if renal function normalizes   Plan: 1) Medication changes recommended at this time: - Give potassium 20 mEq x1 to keep K > 4 - Give IV Mg 2g x1 to keep Mg >2 - Daily BMP and Mg - Consider adding SGLT2i if SCr improves to baseline  of 1.2 (eGFR currently 34) - Recommend [facility] before discharge with severely reduced LVEF  2) Patient assistance: - none pending  3)  Education  - To be completed prior to discharge  Laurey Arrow, PharmD PGY1 Pharmacy Resident 11/15/2021  8:21 AM

## 2021-11-15 NOTE — Progress Notes (Signed)
Physical Therapy Treatment Patient Details Name: Valerie Rodgers MRN: [id number] DOB: 04/12/1951 Today's Date: 11/15/2021   History of Present Illness The pt is a 71 yo female presenting 5/28 with SOB, LE swelling, and heart palpitations. Of note, pt reports cocaine use just prior to calling EMS. Pt found to have CHF exacerbation and in afib. PMH includes: CHF, cocaine abuse, bipolar, CKD, cirrhosis of liver, COPD on chronic 2L O2.    PT Comments    Patient progressing slowly towards PT goals. Awake and eager to get OOB upon PT arrival. Requires min A for bed mobility and Mod A for standing from EOB. Able to take a few steps to get to chair with Mod A for balance, RW management and safety. Bil knee instability noted. Fatigues quickly with SOB with minimal activity. Pt self limiting activity due to above. HR up to 128 bpm, A-fib. Difficult to get Sp02 due to poor pleth, on 4L.min 02 Valerie Rodgers. Pt with cognitive deficits relating to awareness, safety, memory and attention. If pt does not progress and friends are not able to provide assist, may need SNF at d/c. Will follow.   Recommendations for follow up therapy are one component of a multi-disciplinary discharge planning process, led by the attending physician.  Recommendations may be updated based on patient status, additional functional criteria and insurance authorization.  Follow Up Recommendations  Home health PT (if friends can provide assist, may need SNF)     Assistance Recommended at Discharge Frequent or constant Supervision/Assistance  Patient can return home with the following Assistance with cooking/housework;Direct supervision/assist for medications management;Direct supervision/assist for financial management;Assist for transportation;Help with stairs or ramp for entrance;A lot of help with walking and/or transfers;A lot of help with bathing/dressing/bathroom   Equipment Recommendations  None recommended by PT    Recommendations for  Other Services       Precautions / Restrictions Precautions Precautions: Fall;Other (comment) Precaution Comments: watch  O2, supposed to use O2 at baseline, pt states she has it but does not use because she doesn't know how, watch HR Restrictions Weight Bearing Restrictions: No     Mobility  Bed Mobility Overal bed mobility: Needs Assistance Bed Mobility: Supine to Sit     Supine to sit: Min assist, HOB elevated     General bed mobility comments: Min A to power to standing with use of rails. SOB noted.    Transfers Overall transfer level: Needs assistance Equipment used: Rolling walker (2 wheels) Transfers: Sit to/from Stand, Bed to chair/wheelchair/BSC Sit to Stand: Mod assist, Min guard           General transfer comment: Mod A to power to standing with pt pulling up on RW despite cues for hand placement, standing from chair x2, better able to push from arm rests, min guard. Step pivot from bed to chair with Min A with LEs locked out into extension, instability noted and assist with RW management. 2/4 DOE. Pt on 4L.    Ambulation/Gait                   Stairs             Wheelchair Mobility    Modified Rankin (Stroke Patients Only)       Balance Overall balance assessment: Needs assistance Sitting-balance support: Feet supported, No upper extremity supported Sitting balance-Leahy Scale: Good     Standing balance support: During functional activity Standing balance-Leahy Scale: Poor Standing balance comment: dependent on BUE support and Min  A for balance                            Cognition Arousal/Alertness: Awake/alert Behavior During Therapy: WFL for tasks assessed/performed Overall Cognitive Status: Impaired/Different from baseline Area of Impairment: Orientation, Safety/judgement, Memory, Awareness, Attention                 Orientation Level: Disoriented to, Situation Current Attention Level: Sustained Memory:  Decreased short-term memory   Safety/Judgement: Decreased awareness of safety, Decreased awareness of deficits Awareness: Intellectual   General Comments: Very pleasant. Distracted. "next year is going to be 2123"  Decreased awareness of deficits/safety.        Exercises      General Comments General comments (skin integrity, edema, etc.): HR up to 128 bpm, A-fib. Difficult to get Sp02 due to poor pleth, on 4L.min 02 Valerie Rodgers.      Pertinent Vitals/Pain Pain Assessment Pain Assessment: No/denies pain    Home Living                          Prior Function            PT Goals (current goals can now be found in the care plan section) Progress towards PT goals: Progressing toward goals (slowly)    Frequency    Min 3X/week      PT Plan Current plan remains appropriate    Co-evaluation              AM-PAC PT "6 Clicks" Mobility   Outcome Measure  Help needed turning from your back to your side while in a flat bed without using bedrails?: A Little Help needed moving from lying on your back to sitting on the side of a flat bed without using bedrails?: A Little Help needed moving to and from a bed to a chair (including a wheelchair)?: A Lot Help needed standing up from a chair using your arms (e.g., wheelchair or bedside chair)?: A Lot Help needed to walk in hospital room?: Total Help needed climbing 3-5 steps with a railing? : Total 6 Click Score: 12    End of Session Equipment Utilized During Treatment: Gait belt;Oxygen Activity Tolerance: Patient tolerated treatment well;Patient limited by fatigue Patient left: in chair;with call bell/phone within reach;with chair alarm set Nurse Communication: Mobility status PT Visit Diagnosis: Other abnormalities of gait and mobility (R26.89)     Time: [id number] PT Time Calculation (min) (ACUTE ONLY): 23 min  Charges:  $Therapeutic Activity: 23-37 mins                     Valerie Rodgers, PT, DPT Acute  Rehabilitation Services Secure chat preferred Office [phone]      Valerie Rodgers A Arshiya Jakes 11/15/2021, 4:09 PM

## 2021-11-15 NOTE — Progress Notes (Signed)
PROGRESS NOTE    Valerie Rodgers  [id number] DOB: 12/03/1950 DOA: 11/13/2021 PCP: Pcp, No  71 y.o. F with hx of chronic systolic and diastolic CHF, EF [id number] with grade 3 diastolic dysfunction, COPD on 2 L home O2, polysubstance abuse, ongoing cocaine use, bipolar disorder, liver cirrhosis, CKD 3 A presented to the ED with shortness of breath and leg swelling, had been using cocaine right before EMS was called. -In the ED she was tachypneic other vitals were stable, chest x-ray noted concerns for interstitial edema,, BNP was 1828, troponin was 122, UDS was positive for cocaine and EKG noted atrial flutter -She was started on amiodarone and IV heparin along with IV Lasix -Overnight agitation, got 1 mg of Ativan at 5:30 AM, has been obtunded and drowsy 5/29, now improving  Subjective: -Awake since yesterday evening, asking to go home, breathing better  Assessment and Plan:  Acute on chronic systolic and diastolic CHF -Echo [id number] noted EF of 25-30% with grade 3 diastolic dysfunction -Myoview 2/23 negative for ischemia. Fixed small anterior and moderate inferolateral defects: attenuation versus scar. -Complicated by ongoing cocaine abuse and poor compliance with meds -Continue IV Lasix, I's/O's inaccurate, cards following, restarted low-dose Coreg and BiDil today, monitor BP -Overall prognosis is poor in the setting of advanced cardiomyopathy, poor compliance and long history of ongoing cocaine abuse, Palliative consulted and following  Elevated troponin -Likely secondary to demand ischemia, flat trend do not suspect ACS -Recent Myoview as above was negative for ischemia  Acute metabolic encephalopathy -Multifactorial, secondary to Ativan given for agitation, decreased metabolism with liver cirrhosis -ABG without hypercarbia, ammonia level was normal, UDS with cocaine only, CT head is unremarkable, EtOH level was less than 10 -Awake today, answers most questions appropriately -Continue  thiamine -Increase activity, out of bed to chair, PT OT  Paroxysmal atrial flutter with RVR -Cards following -Started on IV amiodarone and heparin on admission -not a good long-term anticoagulation candidate considering noncompliance, polysubstance abuse and liver cirrhosis -Cards starting Xarelto today  COPD, chronic respiratory failure -On 2 L home O2 at baseline, continue nebs  Liver cirrhosis -Likely secondary to EtOH and polysubstance use  CKD 3a -Stable, monitor  Polysubstance abuse -Counseled again   DVT prophylaxis: IV heparin> xarelto Code Status: Full code Family Communication: No family at bedside Disposition Plan:   Consultants:  [place] [facility]   Procedures:   Antimicrobials:    Objective: Vitals:   11/15/21 0540 11/15/21 0720 11/15/21 0732 11/15/21 1054  BP: 113/74 112/79  115/65  Pulse: (!) 59 68  91  Resp: 16 18  17   Temp: (!) 97.4 F (36.3 C) (!) 97.4 F (36.3 C)  98.2 F (36.8 C)  TempSrc: Oral Oral  Oral  SpO2:  96% 94% 100%  Weight:      Height:        Intake/Output Summary (Last 24 hours) at 11/15/2021 1100 Last data filed at 11/15/2021 1053 Gross per 24 hour  Intake 840 ml  Output 1350 ml  Net -510 ml   Filed Weights   11/14/21 0037 11/14/21 0423 11/15/21 0029  Weight: 85.3 kg 85.6 kg 84.6 kg    Examination:  General exam: Chronically ill elderly female sitting up in bed, eyes closed, answers all my questions, oriented to self and place, mild cognitive deficits CVs: S1-S2, irregularly irregular Lungs: Decreased breath sounds to bases otherwise clear Abdomen: Soft, obese, nontender, bowel sounds present Extremities: 1+ edema  Neuro: Appropriate today, moves all extremities, no localizing signs Skin:  No rashes Psychiatry: flat affect    Data Reviewed:   CBC: Recent Labs  Lab 11/13/21 1843 11/14/21 0140 11/15/21 0312  WBC 5.9 6.1 6.2  NEUTROABS 3.4  --   --   HGB 14.2 14.5 13.6  HCT 45.0 45.3 42.6  MCV 88.9  87.3 87.5  PLT 167 151 [id number]   Basic Metabolic Panel: Recent Labs  Lab 11/13/21 1843 11/13/21 2106 11/14/21 0140 11/15/21 0312  NA 144  --  143 139  K 4.0  --  4.3 3.7  CL 114*  --  111 105  CO2 23  --  20* 24  GLUCOSE 103*  --  138* 127*  BUN 22  --  21 22  CREATININE 1.63*  --  1.63* 1.61*  CALCIUM 8.5*  --  8.6* 8.5*  MG 1.8  --  1.7  --   PHOS  --  2.1*  --   --    GFR: Estimated Creatinine Clearance: 33.3 mL/min (A) (by C-G formula based on SCr of 1.61 mg/dL (H)). Liver Function Tests: Recent Labs  Lab 11/13/21 1843 11/14/21 0140 11/15/21 0312  AST 41 45* 34  ALT 49* 51* 48*  ALKPHOS 80 80 71  BILITOT 0.8 0.8 0.5  PROT 5.5* 5.7* 5.4*  ALBUMIN 3.0* 3.1* 2.9*   No results for input(s): LIPASE, AMYLASE in the last 168 hours. Recent Labs  Lab 11/13/21 2307  AMMONIA 21   Coagulation Profile: Recent Labs  Lab 11/13/21 2307  INR 1.2   Cardiac Enzymes: Recent Labs  Lab 11/13/21 2106  CKTOTAL 96   BNP (last 3 results) No results for input(s): PROBNP in the last 8760 hours. HbA1C: No results for input(s): HGBA1C in the last 72 hours. CBG: No results for input(s): GLUCAP in the last 168 hours. Lipid Profile: No results for input(s): CHOL, HDL, LDLCALC, TRIG, CHOLHDL, LDLDIRECT in the last 72 hours. Thyroid Function Tests: Recent Labs    11/13/21 1843  TSH 3.570   Anemia Panel: No results for input(s): VITAMINB12, FOLATE, FERRITIN, TIBC, IRON, RETICCTPCT in the last 72 hours. Urine analysis:    Component Value Date/Time   COLORURINE STRAW (A) 11/14/2021 0458   APPEARANCEUR CLEAR 11/14/2021 0458   LABSPEC 1.006 11/14/2021 0458   PHURINE 5.0 11/14/2021 0458   GLUCOSEU NEGATIVE 11/14/2021 0458   HGBUR NEGATIVE 11/14/2021 0458   BILIRUBINUR NEGATIVE 11/14/2021 0458   KETONESUR NEGATIVE 11/14/2021 0458   PROTEINUR NEGATIVE 11/14/2021 0458   NITRITE NEGATIVE 11/14/2021 0458   LEUKOCYTESUR NEGATIVE 11/14/2021 0458   Sepsis  Labs: @LABRCNTIP (procalcitonin:4,lacticidven:4)  )No results found for this or any previous visit (from the past 240 hour(s)).   Radiology Studies: CT HEAD WO CONTRAST (5MM)  Result Date: 11/14/2021 CLINICAL DATA:  Mental status change.  Unknown cause. EXAM: CT HEAD WITHOUT CONTRAST TECHNIQUE: Contiguous axial images were obtained from the base of the skull through the vertex without intravenous contrast. RADIATION DOSE REDUCTION: This exam was performed according to the departmental dose-optimization program which includes automated exposure control, adjustment of the mA and/or kV according to patient size and/or use of iterative reconstruction technique. COMPARISON:  None Available. FINDINGS: Brain: No evidence of acute infarction, hemorrhage, hydrocephalus, extra-axial collection or mass lesion/mass effect. Mild cerebral atrophy. Vascular: No hyperdense vessel or unexpected calcification. Skull: Normal. Negative for fracture or focal lesion. Sinuses/Orbits: No acute finding. Other: None. IMPRESSION: No acute intracranial abnormality. Electronically Signed   By: Keane Police D.O.   On: 11/14/2021 11:50   DG Chest [facility]  1 View  Result Date: 11/13/2021 CLINICAL DATA:  Shortness of breath. EXAM: PORTABLE CHEST 1 VIEW COMPARISON:  Chest radiograph 07/19/2021. FINDINGS: Enlarged cardiac silhouette. Diffuse mild interstitial prominence. Left basilar opacities. No definite pleural effusions or visible pneumothorax. Biapical pleuroparenchymal scarring. No displaced fracture. IMPRESSION: 1. Left basilar atelectasis and/or consolidation. 2. Mild diffuse interstitial prominence could represent mild interstitial edema or the sequela of recurrent bouts of CHF. 3. Cardiomegaly. Electronically Signed   By: Margaretha Sheffield M.D.   On: 11/13/2021 19:14   ECHOCARDIOGRAM COMPLETE  Result Date: 11/15/2021    ECHOCARDIOGRAM REPORT   Patient Name:   Valerie Rodgers Date of Exam: 11/15/2021 Medical Rec #:  [id number]        Height:       64.0 in Accession #:    [id number]      Weight:       186.4 lb Date of Birth:  10-22-1950        BSA:          1.899 m Patient Age:    42 years        BP:           112/79 mmHg Patient Gender: F               HR:           71 bpm. Exam Location:  Inpatient Procedure: 2D Echo, Cardiac Doppler and Color Doppler Indications:    Congestive heart failure  History:        Patient has prior history of Echocardiogram examinations, most                 recent 07/18/2021. COPD, Arrythmias:Atrial Flutter,                 Signs/Symptoms:Shortness of Breath; Risk Factors:Hypertension.                 Substance abuse.  Sonographer:    Joette Catching RCS Referring Phys: [place]  1. Left ventricular ejection fraction, by estimation, is <20%. The left ventricle has severely decreased function. The left ventricle demonstrates global hypokinesis. The left ventricular internal cavity size was mildly dilated. Left ventricular diastolic parameters are consistent with Grade III diastolic dysfunction (restrictive). Elevated left ventricular end-diastolic pressure.  2. Right ventricular systolic function is severely reduced. The right ventricular size is moderately enlarged. There is moderately elevated pulmonary artery systolic pressure. The estimated right ventricular systolic pressure is [id number] mmHg.  3. Left atrial size was severely dilated.  4. Right atrial size was moderately dilated.  5. Annular dilatation. The mitral valve is normal in structure. Moderate mitral valve regurgitation.  6. Annular dilatation. Tricuspid valve regurgitation is moderate to severe.  7. The aortic valve is tricuspid. Aortic valve regurgitation is trivial. No aortic stenosis is present.  8. The inferior vena cava is dilated in size with <50% respiratory variability, suggesting right atrial pressure of 15 mmHg. Comparison(s): Compared to the study done on 07/18/2021, LVEF has further reduced from 25 to 30% to the present 10 to  15%. Right ventricular function was previously normal and the size was normal. Conclusion(s)/Recommendation(s): Findings consistent with non-ischemic cardiomyopathy. FINDINGS  Left Ventricle: Left ventricular ejection fraction, by estimation, is <20%. The left ventricle has severely decreased function. The left ventricle demonstrates global hypokinesis. The left ventricular internal cavity size was mildly dilated. There is no  left ventricular hypertrophy. Left ventricular diastolic parameters are consistent with Grade III diastolic dysfunction (restrictive).  Elevated left ventricular end-diastolic pressure. Right Ventricle: The right ventricular size is moderately enlarged. No increase in right ventricular wall thickness. Right ventricular systolic function is severely reduced. There is moderately elevated pulmonary artery systolic pressure. The tricuspid regurgitant velocity is 2.87 m/s, and with an assumed right atrial pressure of 15 mmHg, the estimated right ventricular systolic pressure is [id number] mmHg. Left Atrium: Left atrial size was severely dilated. Right Atrium: Right atrial size was moderately dilated. Pericardium: Trivial pericardial effusion is present. Mitral Valve: Annular dilatation. The mitral valve is normal in structure. Moderate mitral valve regurgitation. Tricuspid Valve: Annular dilatation. The tricuspid valve is normal in structure. Tricuspid valve regurgitation is moderate to severe. There is moderate prolapse of the tricuspid. Aortic Valve: The aortic valve is tricuspid. Aortic valve regurgitation is trivial. Aortic regurgitation PHT measures 425 msec. No aortic stenosis is present. Aortic valve mean gradient measures 4.5 mmHg. Aortic valve peak gradient measures 8.1 mmHg. Aortic valve area, by VTI measures 2.08 cm. Pulmonic Valve: The pulmonic valve was not well visualized. Pulmonic valve regurgitation is mild. Aorta: The aortic root is normal in size and structure. Venous: The inferior vena  cava is dilated in size with less than 50% respiratory variability, suggesting right atrial pressure of 15 mmHg. IAS/Shunts: No atrial level shunt detected by color flow Doppler. Additional Comments: There is a small pleural effusion in the left lateral region.  LEFT VENTRICLE PLAX 2D LVIDd:         6.80 cm      Diastology LVIDs:         6.00 cm      LV e' medial:    4.03 cm/s LV PW:         0.80 cm      LV E/e' medial:  31.3 LV IVS:        0.80 cm      LV e' lateral:   8.16 cm/s LVOT diam:     2.10 cm      LV E/e' lateral: 15.5 LV SV:         44 LV SV Index:   23 LVOT Area:     3.46 cm  LV Volumes (MOD) LV vol d, MOD A2C: 166.0 ml LV vol d, MOD A4C: 164.0 ml LV vol s, MOD A2C: 123.0 ml LV vol s, MOD A4C: 118.0 ml LV SV MOD A2C:     43.0 ml LV SV MOD A4C:     164.0 ml LV SV MOD BP:      49.9 ml RIGHT VENTRICLE          IVC RV Basal diam:  4.00 cm  IVC diam: 3.20 cm RV Mid diam:    2.40 cm LEFT ATRIUM              Index        RIGHT ATRIUM           Index LA diam:        4.30 cm  2.26 cm/m   RA Area:     26.70 cm LA Vol (A2C):   79.5 ml  41.87 ml/m  RA Volume:   98.70 ml  51.98 ml/m LA Vol (A4C):   110.0 ml 57.93 ml/m LA Biplane Vol: 93.6 ml  49.30 ml/m  AORTIC VALVE                     PULMONIC VALVE AV Area (Vmax):    2.43 cm  PV Vmax:          0.81 m/s AV Area (Vmean):   2.02 cm      PV Peak grad:     2.6 mmHg AV Area (VTI):     2.08 cm      PR End Diast Vel: 11.11 msec AV Vmax:           142.50 cm/s AV Vmean:          102.000 cm/s AV VTI:            0.210 m AV Peak Grad:      8.1 mmHg AV Mean Grad:      4.5 mmHg LVOT Vmax:         100.10 cm/s LVOT Vmean:        59.367 cm/s LVOT VTI:          0.126 m LVOT/AV VTI ratio: 0.60 AI PHT:            425 msec  AORTA Ao Root diam: 3.00 cm Ao Asc diam:  3.50 cm MITRAL VALVE                TRICUSPID VALVE MV Area (PHT): 7.44 cm     TR Peak grad:   32.9 mmHg MV Decel Time: 102 msec     TR Vmax:        287.00 cm/s MR Peak grad: 77.9 mmHg MR Mean grad: 45.0 mmHg      SHUNTS MR Vmax:      441.33 cm/s   Systemic VTI:  0.13 m MR Vmean:     319.0 cm/s    Systemic Diam: 2.10 cm MV E velocity: 126.33 cm/s MV A velocity: 25.70 cm/s MV E/A ratio:  4.92 Adrian Prows MD Electronically signed by Adrian Prows MD Signature Date/Time: 11/15/2021/10:15:29 AM    Final      Scheduled Meds:  amiodarone  200 mg Oral Daily   carvedilol  3.125 mg Oral BID WC   fluticasone furoate-vilanterol  1 puff Inhalation Daily   furosemide  40 mg Intravenous Q12H   isosorbide-hydrALAZINE  1 tablet Oral BID   nicotine  21 mg Transdermal Daily   rivaroxaban  15 mg Oral Q supper   thiamine injection  100 mg Intravenous Daily   Continuous Infusions:     LOS: 2 days    Time spent: 43min   Domenic Polite, MD [facility]   11/15/2021, 11:00 AM

## 2021-11-15 NOTE — Progress Notes (Addendum)
Heart Failure Navigation Team Progress Note  PCP: Pcp, No Primary Cardiologist: [facility] Admitted from: home  Past Medical History:  Diagnosis Date   Arthritis    Bipolar affective disorder (HCC)    Cataract     Social History   Socioeconomic History   Marital status: Widowed    Spouse name: Not on file   Number of children: 1   Years of education: Not on file   Highest education level: Not on file  Occupational History   Not on file  Tobacco Use   Smoking status: Some Days    Packs/day: 0.25    Years: 65.00    Pack years: 16.25    Types: Cigarettes   Smokeless tobacco: Never  Vaping Use   Vaping Use: Never used  Substance and Sexual Activity   Alcohol use: Yes    Comment: 1 pint weekly   Drug use: Yes    Types: Cocaine, Marijuana   Sexual activity: Not on file  Other Topics Concern   Not on file  Social History Narrative   Not on file   Social Determinants of Health   Financial Resource Strain: Not on file  Food Insecurity: Not on file  Transportation Needs: Not on file  Physical Activity: Not on file  Stress: Not on file  Social Connections: Not on file     Heart & Vascular Transition of Care Clinic follow-up: Scheduled for N/A [facility] Pt.  Immediate social needs: compliance  HF CSW attempted to visit Valerie Rodgers at bedside per request of Dr. Jomarie Longs and concern for readmission and compliance, however Valerie Rodgers would not awake to have a conversation. CSW will try again later at another time.   Valerie Rodgers, MSW, LCSW [phone] Heart Failure Social Worker

## 2021-11-15 NOTE — Progress Notes (Signed)
Mobility Specialist Progress Note:   11/15/21 1706  Mobility  Activity Transferred from chair to bed  Level of Assistance Minimal assist, patient does 75% or more  Assistive Device  (HHA)  Distance Ambulated (ft) 4 ft  Activity Response Tolerated well  $Mobility charge 1 Mobility   Pt needing to get back to bed.MinA to stand and step to bed. Left in bed with call bell in reach and all needs met.   [facility] Rhyker Silversmith Mobility Specialist

## 2021-11-15 NOTE — Plan of Care (Signed)
  Problem: Activity: Goal: Risk for activity intolerance will decrease Outcome: Progressing   Problem: Pain Managment: Goal: General experience of comfort will improve Outcome: Progressing   Problem: Safety: Goal: Ability to remain free from injury will improve Outcome: Progressing   

## 2021-11-15 NOTE — Plan of Care (Signed)

## 2021-11-15 NOTE — Progress Notes (Signed)
OT Cancellation Note  Patient Details Name: Valerie Rodgers MRN: [id number] DOB: 17-Mar-1951   Cancelled Treatment:    Reason Eval/Treat Not Completed: Patient's level of consciousness;Other (comment) (Attempted OT Eval x2 this date. Pt initially answering questions both sessions & then closes her eyes & becomes difficult to arouse and does not engage further. Does not follow commands. Spoke with RN and made aware. Will re-attempt as OT schedule allows.)  Alm Bustard, OTR/L 11/15/2021, 10:21 AM

## 2021-11-15 NOTE — Progress Notes (Signed)
Subjective:  Patient is drowsy, appears confused and drifts to sleep while talking, some of the words I could not make out.  Does state that she missed her appointments in our office that was scheduled previously.  Otherwise no specific complaints.   Intake/Output from previous day:  I/O last 3 completed shifts: In: 1068.6 [P.O.:600; I.V.:320.3; IV Piggyback:148.2] Out: 1400 [Urine:1400] No intake/output data recorded. Net IO Since Admission: -331.42 mL [11/15/21 0829]  Blood pressure 112/79, pulse 68, temperature (!) 97.4 F (36.3 C), temperature source Oral, resp. rate 18, height $RemoveBe'5\' 4"'XFxNsMRqn$  (1.626 m), weight 84.6 kg, SpO2 94 %. Physical Exam Constitutional:      General: She is in acute distress (Mild respiratory).     Appearance: She is obese.  Neck:     Vascular: JVD present. No carotid bruit.  Cardiovascular:     Rate and Rhythm: Normal rate. Rhythm irregular.     Pulses: Intact distal pulses.     Heart sounds: Murmur heard.  Blowing systolic murmur is present with a grade of 2/6 at the lower left sternal border radiating to the apex.    No gallop.  Pulmonary:     Effort: Pulmonary effort is normal.     Breath sounds: Examination of the right-lower field reveals rales. Examination of the left-lower field reveals decreased breath sounds and rales. Decreased breath sounds and rales present.  Abdominal:     General: Bowel sounds are normal.     Palpations: Abdomen is soft.  Musculoskeletal:     Right lower leg: Edema (2+) present.     Left lower leg: Edema (2+) present.   Lab Results: BMP BNP (last 3 results) Recent Labs    07/17/21 2206 07/20/21 0542 11/13/21 1843  BNP 2,729.9* 509.0* 1,828.7*    ProBNP (last 3 results) No results for input(s): PROBNP in the last 8760 hours.    Latest Ref Rng & Units 11/15/2021    3:12 AM 11/14/2021    1:40 AM 11/13/2021    6:43 PM  BMP  Glucose 70 - 99 mg/dL 127   138   103    BUN 8 - 23 mg/dL $Remove'22   21   22    'VOBpzre$ Creatinine 0.44 - 1.00  mg/dL 1.61   1.63   1.63    Sodium 135 - 145 mmol/L 139   143   144    Potassium 3.5 - 5.1 mmol/L 3.7   4.3   4.0    Chloride 98 - 111 mmol/L 105   111   114    CO2 22 - 32 mmol/L $RemoveB'24   20   23    'sZAuXquV$ Calcium 8.9 - 10.3 mg/dL 8.5   8.6   8.5        Latest Ref Rng & Units 11/15/2021    3:12 AM 11/14/2021    1:40 AM 11/13/2021    6:43 PM  Hepatic Function  Total Protein 6.5 - 8.1 g/dL 5.4   5.7   5.5    Albumin 3.5 - 5.0 g/dL 2.9   3.1   3.0    AST 15 - 41 U/L 34   45   41    ALT 0 - 44 U/L 48   51   49    Alk Phosphatase 38 - 126 U/L 71   80   80    Total Bilirubin 0.3 - 1.2 mg/dL 0.5   0.8   0.8        Latest Ref Rng &  Units 11/15/2021    3:12 AM 11/14/2021    1:40 AM 11/13/2021    6:43 PM  CBC  WBC 4.0 - 10.5 K/uL 6.2   6.1   5.9    Hemoglobin 12.0 - 15.0 g/dL 13.6   14.5   14.2    Hematocrit 36.0 - 46.0 % 42.6   45.3   45.0    Platelets 150 - 400 K/uL 169   151   167     Lipid Panel     Component Value Date/Time   CHOL 171 05/24/2009 2044   TRIG 92 05/24/2009 2044   HDL 56 05/24/2009 2044   CHOLHDL 3.1 Ratio 05/24/2009 2044   VLDL 18 05/24/2009 2044   LDLCALC 97 05/24/2009 2044   Cardiac Panel (last 3 results) Recent Labs    11/13/21 2106  CKTOTAL 96    HEMOGLOBIN A1C No results found for: HGBA1C, MPG TSH Recent Labs    11/13/21 1843  TSH 3.570   Imaging: CT HEAD WO CONTRAST (5MM)  Result Date: 11/14/2021 CLINICAL DATA:  Mental status change.  Unknown cause. EXAM: CT HEAD WITHOUT CONTRAST TECHNIQUE: Contiguous axial images were obtained from the base of the skull through the vertex without intravenous contrast. RADIATION DOSE REDUCTION: This exam was performed according to the departmental dose-optimization program which includes automated exposure control, adjustment of the mA and/or kV according to patient size and/or use of iterative reconstruction technique. COMPARISON:  None Available. FINDINGS: Brain: No evidence of acute infarction, hemorrhage, hydrocephalus,  extra-axial collection or mass lesion/mass effect. Mild cerebral atrophy. Vascular: No hyperdense vessel or unexpected calcification. Skull: Normal. Negative for fracture or focal lesion. Sinuses/Orbits: No acute finding. Other: None. IMPRESSION: No acute intracranial abnormality. Electronically Signed   By: Keane Police D.O.   On: 11/14/2021 11:50   DG Chest Port 1 View  Result Date: 11/13/2021 CLINICAL DATA:  Shortness of breath. EXAM: PORTABLE CHEST 1 VIEW COMPARISON:  Chest radiograph 07/19/2021. FINDINGS: Enlarged cardiac silhouette. Diffuse mild interstitial prominence. Left basilar opacities. No definite pleural effusions or visible pneumothorax. Biapical pleuroparenchymal scarring. No displaced fracture. IMPRESSION: 1. Left basilar atelectasis and/or consolidation. 2. Mild diffuse interstitial prominence could represent mild interstitial edema or the sequela of recurrent bouts of CHF. 3. Cardiomegaly. Electronically Signed   By: Margaretha Sheffield M.D.   On: 11/13/2021 19:14    Ultrasound abdomen 07/18/2021: 1. Subtle nodularity of the liver contour suggesting cirrhosis. No hepatic mass identified. 2. 3 mm echogenic focus in the gallbladder which may represent a polyp  Cardiac Studies:  EKG:  EKG 11/14/2021: Atypical atrial flutter with variable AV conduction.  Normal axis.  Poor R wave progression, cannot exclude anteroseptal infarct old.  Nonspecific abnormality.  Compared to 08/04/2021, wandering atrial pacemaker is now replaced by flutter.  Nuclear stress test 07/21/2021: 1. Negative for ischemia. Fixed small anterior and moderate inferolateral defects: attenuation versus scar. 2. Global hypokinesis with marked LVE. 3. Left ventricular ejection fraction 21% 4. Non invasive risk stratification*: High   Echocardiogram 11/15/2021:   1. Left ventricular ejection fraction, by estimation, is <20%. The left ventricle has severely decreased function. The left ventricle demonstrates global  hypokinesis. The left ventricular internal cavity size was mildly dilated. Left ventricular  diastolic parameters are consistent with Grade III diastolic dysfunction (restrictive). Elevated left ventricular end-diastolic pressure.  2. Right ventricular systolic function is severely reduced. The right ventricular size is moderately enlarged. There is moderately elevated pulmonary artery systolic pressure. The estimated right ventricular systolic pressure is  47.9 mmHg.  3. Left atrial size was severely dilated.  4. Right atrial size was moderately dilated.  5. Annular dilatation. The mitral valve is normal in structure. Moderate mitral valve regurgitation.  6. Annular dilatation. Tricuspid valve regurgitation is moderate to severe.  7. The aortic valve is tricuspid. Aortic valve regurgitation is trivial. No aortic stenosis is present.  8. The inferior vena cava is dilated in size with <50% respiratory variability, suggesting right atrial pressure of 15 mmHg.  Comparison(s): Compared to the study done on 07/18/2021, LVEF has further reduced from 25 to 30% to the present 10 to 15%. Right ventricular function was previously normal and the size was normal.  Conclusion(s)/Recommendation(s): Findings consistent with non-ischemic cardiomyopathy.   Conclusion(s)/Recommendation(s): Findings consistent with non-ischemic cardiomyopathy. Scheduled Meds:  fluticasone furoate-vilanterol  1 puff Inhalation Daily   furosemide  40 mg Intravenous Q12H   nicotine  21 mg Transdermal Daily   thiamine injection  100 mg Intravenous Daily   Continuous Infusions:  amiodarone 30 mg/hr (11/14/21 2239)   heparin 1,000 Units/hr (11/14/21 1751)   PRN Meds:.acetaminophen **OR** acetaminophen  Assessment   1.  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  Atypical atrial flutter with variable AV conduction. CHA2DS2-VASc Score is 4.  Yearly risk of stroke: 4.8% (A, F, CHF, HTN).  Score  of 1=0.6; 2=2.2; 3=3.2; 4=4.8; 5=7.2; 6=9.8; 7=>9.8) -(CHF; HTN; vasc disease DM,  Female = 1; Age <65 =0; 65-74 = 1,  >75 =2; stroke/embolism= 2).   3.  Substance use, with ongoing cocaine use and tobacco use disorder 4.  Acute on chronic systolic heart failure 5.  Chronic stage IIIb kidney disease 6.  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  Echocardiogram now revealing biventricular heart failure, further reduction in LVEF and moderately severe TR and MR which have all progressed significantly.  This is compared to January echocardiogram.  She is drowsy this morning, could not get much history out of her.  But for now we will try to optimize her medical care again, will start her on carvedilol (nonselective) 3.25 mg p.o. twice daily, BiDil 1 p.o. twice daily and hold for SBP <100 mmHg, continue IV Lasix and follow-up on total fluid balance.  I would like to start her on Xarelto 15 mg in the evening at least for now, in the hopes of cardioversion.  I do not think she is a good candidate for long-term anticoagulation, has history of noncompliance.  She was admitted in January 2023 with similar situation and cocaine use, she now presents back similarly having stopped all her medications.  Overall long-term prognosis is grim, palliative care is appropriate as well.  Her survival chances at 6 months are <50%.  She is not a candidate for advanced heart failure therapy.    Adrian Prows, MD, [facility] 11/15/2021, 8:29 AM Office: [phone] Fax: [phone] Pager: [phone]

## 2021-11-16 DIAGNOSIS — Z7189 Other specified counseling: Secondary | ICD-10-CM

## 2021-11-16 DIAGNOSIS — Z515 Encounter for palliative care: Secondary | ICD-10-CM | POA: Diagnosis not present

## 2021-11-16 DIAGNOSIS — I5043 Acute on chronic combined systolic (congestive) and diastolic (congestive) heart failure: Secondary | ICD-10-CM | POA: Diagnosis not present

## 2021-11-16 DIAGNOSIS — N1831 Chronic kidney disease, stage 3a: Secondary | ICD-10-CM | POA: Diagnosis not present

## 2021-11-16 DIAGNOSIS — J9621 Acute and chronic respiratory failure with hypoxia: Secondary | ICD-10-CM | POA: Diagnosis not present

## 2021-11-16 DIAGNOSIS — G9341 Metabolic encephalopathy: Secondary | ICD-10-CM | POA: Diagnosis not present

## 2021-11-16 LAB — BASIC METABOLIC PANEL
Anion gap: 4 — ABNORMAL LOW (ref 5–15)
Anion gap: 6 (ref 5–15)
BUN: 21 mg/dL (ref 8–23)
BUN: 19 mg/dL (ref 8–23)
CO2: 30 mmol/L (ref 22–32)
CO2: 31 mmol/L (ref 22–32)
Calcium: 8.3 mg/dL — ABNORMAL LOW (ref 8.9–10.3)
Calcium: 8.4 mg/dL — ABNORMAL LOW (ref 8.9–10.3)
Chloride: 106 mmol/L (ref 98–111)
Chloride: 106 mmol/L (ref 98–111)
Creatinine, Ser: 1.5 mg/dL — ABNORMAL HIGH (ref 0.44–1.00)
Creatinine, Ser: 1.41 mg/dL — ABNORMAL HIGH (ref 0.44–1.00)
GFR, Estimated: 37 mL/min — ABNORMAL LOW (ref >60)
GFR, Estimated: 40 mL/min — ABNORMAL LOW (ref >60)
Glucose, Bld: 94 mg/dL (ref 70–99)
Glucose, Bld: 93 mg/dL (ref 70–99)
Potassium: 3.2 mmol/L — ABNORMAL LOW (ref 3.5–5.1)
Potassium: 3.7 mmol/L (ref 3.5–5.1)
Sodium: 140 mmol/L (ref 135–145)
Sodium: 143 mmol/L (ref 135–145)

## 2021-11-16 LAB — CBC
HCT: 38.5 % (ref 36.0–46.0)
Hemoglobin: 12.5 g/dL (ref 12.0–15.0)
MCH: 27.8 pg (ref 26.0–34.0)
MCHC: 32.5 g/dL (ref 30.0–36.0)
MCV: 85.6 fL (ref 80.0–100.0)
Platelets: 170 10*3/uL (ref 150–400)
RBC: 4.5 MIL/uL (ref 3.87–5.11)
RDW: 15.8 % — ABNORMAL HIGH (ref 11.5–15.5)
WBC: 5.7 10*3/uL (ref 4.0–10.5)
nRBC: 0 % (ref 0.0–0.2)

## 2021-11-16 LAB — HEMOGLOBIN A1C
Hgb A1c MFr Bld: 6 % — ABNORMAL HIGH (ref 4.8–5.6)
Mean Plasma Glucose: 125.5 mg/dL

## 2021-11-16 LAB — MAGNESIUM: Magnesium: 1.6 mg/dL — ABNORMAL LOW (ref 1.7–2.4)

## 2021-11-16 MED ORDER — CARVEDILOL 3.125 MG PO TABS
3.1250 mg | ORAL_TABLET | Freq: Two times a day (BID) | ORAL | Status: DC
  Administered 2021-11-17: 3.125 mg via ORAL
  Filled 2021-11-16 (×2): qty 1

## 2021-11-16 MED ORDER — DAPAGLIFLOZIN PROPANEDIOL 10 MG PO TABS
10.0000 mg | ORAL_TABLET | Freq: Every day | ORAL | Status: DC
  Filled 2021-11-16: qty 1

## 2021-11-16 MED ORDER — POTASSIUM CHLORIDE CRYS ER 20 MEQ PO TBCR
40.0000 meq | EXTENDED_RELEASE_TABLET | ORAL | Status: DC
  Administered 2021-11-16: 40 meq via ORAL
  Filled 2021-11-16: qty 2

## 2021-11-16 MED ORDER — MAGNESIUM SULFATE 2 GM/50ML IV SOLN
2.0000 g | Freq: Once | INTRAVENOUS | Status: AC
Start: 2021-11-16 — End: 2021-11-16
  Administered 2021-11-16: 2 g via INTRAVENOUS
  Filled 2021-11-16: qty 50

## 2021-11-16 MED ORDER — POTASSIUM CHLORIDE CRYS ER 20 MEQ PO TBCR
40.0000 meq | EXTENDED_RELEASE_TABLET | ORAL | Status: DC
  Administered 2021-11-16 (×2): 40 meq via ORAL
  Filled 2021-11-16 (×2): qty 2

## 2021-11-16 NOTE — Assessment & Plan Note (Addendum)
Patient was admitted to the progressive care unit, she was placed on IV furosemide for aggressive diuresis, negative fluid balance was achieved, -8,981 ml, with significant improvement in her symptoms.   Echocardiogram with reduced LV systolic function with EF <20%, with global hypokinesis, mild LV enlarged cavity, severe reduction in RV systolic function, moderate enlargement in RV cavity, RVSP 47,9, severe LA dilatation. Moderate mitral regurgitation. Moderate to severe TR.   Elevated troponin due to heart failure exacerbation.   Patient will continue afterload reduction with Bidil and B blockade with carvedilol.  Continue with Ivabradine  Continue diuresis with torsemide.

## 2021-11-16 NOTE — Assessment & Plan Note (Signed)
Patient has been explained in detail anticoagulation for atrial fibrillation, she has refused oral anticoagulation.  Considering her cocaine and alcohol abuse she has a increased risk of falls and bleeding.   For now will continue rate control with carvedilol and have close follow up as outpatient.

## 2021-11-16 NOTE — Progress Notes (Signed)
This chaplain responded to PMT consult for creating/updating the Pt. Advance Directive:  HCPOA. The chaplain understands the PMT NP-Michele Ferolito completed AD education and filled out the document with the Pt. The Pt. AD is in her chart.   At the time of the visit, the Pt. shares with the chaplain she is cold and requests extra blankets and raising the temperature in the room. The Pt. is unable to remain awake and answer clarifying questions to proceed with a notary visit. The chaplain updated the Pt. RN-Alaina. This chaplain will attempt a re-visit.  Chaplain Stephanie Acre [phone]

## 2021-11-16 NOTE — Progress Notes (Signed)
Progress Note  Patient Name: Valerie Rodgers Date of Encounter: 11/16/2021  Attending physician: Tawni Millers Primary care provider: Pcp, No  Subjective: Valerie Rodgers is a 71 y.o. female who was seen and examined at bedside  More awake and alert compared to prior visits. Resting in bed comfortably. Mildly short of breath at rest. Continues to have bilateral lower extremity swelling. Denies angina pectoris Case discussed and reviewed with her nurse.  Objective: Temp:  [97.2 F (36.2 C)-97.5 F (36.4 C)] 97.4 F (36.3 C) (05/31 0439) Pulse Rate:  [48-98] 48 (05/31 0738) Cardiac Rhythm: Atrial flutter (05/30 1901) Resp:  [18-21] 21 (05/31 0738) BP: (84-129)/(57-101) 104/81 (05/31 0738) SpO2:  [97 %-100 %] 97 % (05/31 1412) Weight:  [84.1 kg] 84.1 kg (05/31 0439)  Intake/Output:  Intake/Output Summary (Last 24 hours) at 11/16/2021 1518 Last data filed at 11/16/2021 1007 Gross per 24 hour  Intake 1490.4 ml  Output 3600 ml  Net -2109.6 ml    Net IO Since Admission: -2,611.02 mL [11/16/21 1518]  Weights:  Filed Weights   11/14/21 0423 11/15/21 0029 11/16/21 0439  Weight: 85.6 kg 84.6 kg 84.1 kg    Telemetry: Personally reviewed.  Predominately atrial flutter, rate controlled  Physical examination: PHYSICAL EXAM:    11/16/2021    7:38 AM 11/16/2021    4:39 AM 11/15/2021   10:25 PM  Vitals with BMI  Weight  185 lbs 8 oz   BMI  99991111   Systolic 123456 Q000111Q 123456  Diastolic 81 [id number]  Pulse 48 83     CONSTITUTIONAL: Appears older than stated age, mildly disheveled, mild respiratory distress, hemodynamically stable, resting in bed SKIN: Skin is warm and dry. No rash noted. No cyanosis. No pallor. No jaundice HEAD: Normocephalic and atraumatic.  EYES: No scleral icterus MOUTH/THROAT: Moist oral membranes.  NECK: JVD present. No thyromegaly noted. No carotid bruits  CHEST Normal respiratory effort. No intercostal retractions  LUNGS: Clear to auscultation  bilaterally upper and midlung fields posteriorly.  No stridor.  Bibasilar expiratory wheezes and rales. CARDIOVASCULAR: Irregularly irregular, variable Q000111Q, holosystolic murmur at the left lower sternal border, no gallop.   ABDOMINAL: Soft, nontender, nondistended, positive bowel sounds in all 4 quadrants, no apparent ascites.  EXTREMITIES: Bilateral 2+ pitting edema mid shin, warm to touch HEMATOLOGIC: No significant bruising NEUROLOGIC: Oriented to person, place, and time. Nonfocal. Normal muscle tone.  PSYCHIATRIC: Normal mood and affect. Normal behavior. Cooperative  Lab Results: Hematology Recent Labs  Lab 11/14/21 0140 11/15/21 0312 11/16/21 0341  WBC 6.1 6.2 5.7  RBC 5.19* 4.87 4.50  HGB 14.5 13.6 12.5  HCT 45.3 42.6 38.5  MCV 87.3 87.5 85.6  MCH 27.9 27.9 27.8  MCHC 32.0 31.9 32.5  RDW 16.1* 15.9* 15.8*  PLT 151 169 170    Chemistry Recent Labs  Lab 11/13/21 1843 11/14/21 0140 11/15/21 0312 11/16/21 0341 11/16/21 1143  NA 144 143 139 140 143  K 4.0 4.3 3.7 3.2* 3.7  CL 114* 111 105 106 106  CO2 23 20* 24 30 31   GLUCOSE 103* 138* 127* 94 93  BUN 22 21 22 21 19   CREATININE 1.63* 1.63* 1.61* 1.50* 1.41*  CALCIUM 8.5* 8.6* 8.5* 8.3* 8.4*  PROT 5.5* 5.7* 5.4*  --   --   ALBUMIN 3.0* 3.1* 2.9*  --   --   AST 41 45* 34  --   --   ALT 49* 51* 48*  --   --   ALKPHOS 80  80 71  --   --   BILITOT 0.8 0.8 0.5  --   --   GFRNONAA 33* 33* 34* 37* 40*  ANIONGAP 7 12 10  4* 6     Cardiac Enzymes: Cardiac Panel (last 3 results) Recent Labs    11/13/21 1843 11/13/21 2106 11/13/21 2307  CKTOTAL  --  96  --   TROPONINIHS 122* 89* 116*    BNP (last 3 results) Recent Labs    07/17/21 2206 07/20/21 0542 11/13/21 1843  BNP 2,729.9* 509.0* 1,828.7*    ProBNP (last 3 results) No results for input(s): PROBNP in the last 8760 hours.   DDimer No results for input(s): DDIMER in the last 168 hours.   Hemoglobin A1c:  Lab Results  Component Value Date   HGBA1C  6.0 (H) 11/16/2021   MPG 125.5 11/16/2021    TSH  Recent Labs    11/13/21 1843  TSH 3.570    Lipid Panel     Component Value Date/Time   CHOL 171 05/24/2009 2044   TRIG 92 05/24/2009 2044   HDL 56 05/24/2009 2044   CHOLHDL 3.1 Ratio 05/24/2009 2044   VLDL 18 05/24/2009 2044   LDLCALC 97 05/24/2009 2044   Estimated Creatinine Clearance: 37.9 mL/min (A) (by C-G formula based on SCr of 1.41 mg/dL (H)).  Imaging: ECHOCARDIOGRAM COMPLETE  Result Date: 11/15/2021    ECHOCARDIOGRAM REPORT   Patient Name:   Valerie Rodgers Date of Exam: 11/15/2021 Medical Rec #:  [id number]       Height:       64.0 in Accession #:    [id number]      Weight:       186.4 lb Date of Birth:  Sep 18, 1950        BSA:          1.899 m Patient Age:    71 years        BP:           112/79 mmHg Patient Gender: F               HR:           71 bpm. Exam Location:  Inpatient Procedure: 2D Echo, Cardiac Doppler and Color Doppler Indications:    Congestive heart failure  History:        Patient has prior history of Echocardiogram examinations, most                 recent 07/18/2021. COPD, Arrythmias:Atrial Flutter,                 Signs/Symptoms:Shortness of Breath; Risk Factors:Hypertension.                 Substance abuse.  Sonographer:    Rodrigo Ran RCS Referring Phys: [id number] Y[address] [place] IMPRESSIONS  1. Left ventricular ejection fraction, by estimation, is <20%. The left ventricle has severely decreased function. The left ventricle demonstrates global hypokinesis. The left ventricular internal cavity size was mildly dilated. Left ventricular diastolic parameters are consistent with Grade III diastolic dysfunction (restrictive). Elevated left ventricular end-diastolic pressure.  2. Right ventricular systolic function is severely reduced. The right ventricular size is moderately enlarged. There is moderately elevated pulmonary artery systolic pressure. The estimated right ventricular systolic pressure is 47.9 mmHg.  3. Left atrial  size was severely dilated.  4. Right atrial size was moderately dilated.  5. Annular dilatation. The mitral valve is normal in structure. Moderate mitral valve regurgitation.  6.  Annular dilatation. Tricuspid valve regurgitation is moderate to severe.  7. The aortic valve is tricuspid. Aortic valve regurgitation is trivial. No aortic stenosis is present.  8. The inferior vena cava is dilated in size with <50% respiratory variability, suggesting right atrial pressure of 15 mmHg. Comparison(s): Compared to the study done on 07/18/2021, LVEF has further reduced from 25 to 30% to the present 10 to 15%. Right ventricular function was previously normal and the size was normal. Conclusion(s)/Recommendation(s): Findings consistent with non-ischemic cardiomyopathy. FINDINGS  Left Ventricle: Left ventricular ejection fraction, by estimation, is <20%. The left ventricle has severely decreased function. The left ventricle demonstrates global hypokinesis. The left ventricular internal cavity size was mildly dilated. There is no  left ventricular hypertrophy. Left ventricular diastolic parameters are consistent with Grade III diastolic dysfunction (restrictive). Elevated left ventricular end-diastolic pressure. Right Ventricle: The right ventricular size is moderately enlarged. No increase in right ventricular wall thickness. Right ventricular systolic function is severely reduced. There is moderately elevated pulmonary artery systolic pressure. The tricuspid regurgitant velocity is 2.87 m/s, and with an assumed right atrial pressure of 15 mmHg, the estimated right ventricular systolic pressure is [id number] mmHg. Left Atrium: Left atrial size was severely dilated. Right Atrium: Right atrial size was moderately dilated. Pericardium: Trivial pericardial effusion is present. Mitral Valve: Annular dilatation. The mitral valve is normal in structure. Moderate mitral valve regurgitation. Tricuspid Valve: Annular dilatation. The tricuspid  valve is normal in structure. Tricuspid valve regurgitation is moderate to severe. There is moderate prolapse of the tricuspid. Aortic Valve: The aortic valve is tricuspid. Aortic valve regurgitation is trivial. Aortic regurgitation PHT measures 425 msec. No aortic stenosis is present. Aortic valve mean gradient measures 4.5 mmHg. Aortic valve peak gradient measures 8.1 mmHg. Aortic valve area, by VTI measures 2.08 cm. Pulmonic Valve: The pulmonic valve was not well visualized. Pulmonic valve regurgitation is mild. Aorta: The aortic root is normal in size and structure. Venous: The inferior vena cava is dilated in size with less than 50% respiratory variability, suggesting right atrial pressure of 15 mmHg. IAS/Shunts: No atrial level shunt detected by color flow Doppler. Additional Comments: There is a small pleural effusion in the left lateral region.  LEFT VENTRICLE PLAX 2D LVIDd:         6.80 cm      Diastology LVIDs:         6.00 cm      LV e' medial:    4.03 cm/s LV PW:         0.80 cm      LV E/e' medial:  31.3 LV IVS:        0.80 cm      LV e' lateral:   8.16 cm/s LVOT diam:     2.10 cm      LV E/e' lateral: 15.5 LV SV:         44 LV SV Index:   23 LVOT Area:     3.46 cm  LV Volumes (MOD) LV vol d, MOD A2C: 166.0 ml LV vol d, MOD A4C: 164.0 ml LV vol s, MOD A2C: 123.0 ml LV vol s, MOD A4C: 118.0 ml LV SV MOD A2C:     43.0 ml LV SV MOD A4C:     164.0 ml LV SV MOD BP:      49.9 ml RIGHT VENTRICLE          IVC RV Basal diam:  4.00 cm  IVC diam: 3.20 cm RV Mid diam:  2.40 cm LEFT ATRIUM              Index        RIGHT ATRIUM           Index LA diam:        4.30 cm  2.26 cm/m   RA Area:     26.70 cm LA Vol (A2C):   79.5 ml  41.87 ml/m  RA Volume:   98.70 ml  51.98 ml/m LA Vol (A4C):   110.0 ml 57.93 ml/m LA Biplane Vol: 93.6 ml  49.30 ml/m  AORTIC VALVE                     PULMONIC VALVE AV Area (Vmax):    2.43 cm      PV Vmax:          0.81 m/s AV Area (Vmean):   2.02 cm      PV Peak grad:     2.6  mmHg AV Area (VTI):     2.08 cm      PR End Diast Vel: 11.11 msec AV Vmax:           142.50 cm/s AV Vmean:          102.000 cm/s AV VTI:            0.210 m AV Peak Grad:      8.1 mmHg AV Mean Grad:      4.5 mmHg LVOT Vmax:         100.10 cm/s LVOT Vmean:        59.367 cm/s LVOT VTI:          0.126 m LVOT/AV VTI ratio: 0.60 AI PHT:            425 msec  AORTA Ao Root diam: 3.00 cm Ao Asc diam:  3.50 cm MITRAL VALVE                TRICUSPID VALVE MV Area (PHT): 7.44 cm     TR Peak grad:   32.9 mmHg MV Decel Time: 102 msec     TR Vmax:        287.00 cm/s MR Peak grad: 77.9 mmHg MR Mean grad: 45.0 mmHg     SHUNTS MR Vmax:      441.33 cm/s   Systemic VTI:  0.13 m MR Vmean:     319.0 cm/s    Systemic Diam: 2.10 cm MV E velocity: 126.33 cm/s MV A velocity: 25.70 cm/s MV E/A ratio:  4.92 Adrian Prows MD Electronically signed by Adrian Prows MD Signature Date/Time: 11/15/2021/10:15:29 AM    Final      Ultrasound abdomen 07/18/2021: 1. Subtle nodularity of the liver contour suggesting cirrhosis. No hepatic mass identified. 2. 3 mm echogenic focus in the gallbladder which may represent a polyp   CARDIAC DATABASE: EKG:  EKG 11/14/2021: Atypical atrial flutter with variable AV conduction.  Normal axis.  Poor R wave progression, cannot exclude anteroseptal infarct old.  Nonspecific abnormality.  Compared to 08/04/2021, wandering atrial pacemaker is now replaced by flutter.   Nuclear stress test 07/21/2021: 1. Negative for ischemia. Fixed small anterior and moderate inferolateral defects: attenuation versus scar. 2. Global hypokinesis with marked LVE. 3. Left ventricular ejection fraction 21% 4. Non invasive risk stratification*: High    Echocardiogram 11/15/2021:   1. Left ventricular ejection fraction, by estimation, is <20%. The left ventricle has severely decreased function. The left ventricle demonstrates global hypokinesis. The  left ventricular internal cavity size was mildly dilated. Left ventricular  diastolic  parameters are consistent with Grade III diastolic dysfunction (restrictive). Elevated left ventricular end-diastolic pressure.  2. Right ventricular systolic function is severely reduced. The right ventricular size is moderately enlarged. There is moderately elevated pulmonary artery systolic pressure. The estimated right ventricular systolic pressure is [id number] mmHg.  3. Left atrial size was severely dilated.  4. Right atrial size was moderately dilated.  5. Annular dilatation. The mitral valve is normal in structure. Moderate mitral valve regurgitation.  6. Annular dilatation. Tricuspid valve regurgitation is moderate to severe.  7. The aortic valve is tricuspid. Aortic valve regurgitation is trivial. No aortic stenosis is present.  8. The inferior vena cava is dilated in size with <50% respiratory variability, suggesting right atrial pressure of 15 mmHg.   Comparison(s): Compared to the study done on 07/18/2021, LVEF has further reduced from 25 to 30% to the present 10 to 15%. Right ventricular function was previously normal and the size was normal.   Conclusion(s)/Recommendation(s): Findings consistent with non-ischemic cardiomyopathy.     Conclusion(s)/Recommendation(s): Findings consistent with non-ischemic cardiomyopathy.  Scheduled Meds:  amiodarone  200 mg Oral Daily   carvedilol  3.125 mg Oral BID WC   fluticasone furoate-vilanterol  1 puff Inhalation Daily   furosemide  40 mg Intravenous Q12H   isosorbide-hydrALAZINE  1 tablet Oral BID   nicotine  21 mg Transdermal Daily   rivaroxaban  15 mg Oral Q supper   thiamine injection  100 mg Intravenous Daily    Continuous Infusions:   PRN Meds: acetaminophen **OR** acetaminophen   IMPRESSION & RECOMMENDATIONS: KAYBRIE OZGA is a 71 y.o. African-American female whose past medical history and cardiac risk factors include: Presumed nonischemic cardiomyopathy, hypertension, COPD, bipolar disorder, recurrent cocaine abuse,  noncompliance, combined systolic and diastolic heart failure, advanced age, postmenopausal female.  Impression: Atypical atrial flutter, rate improving. Acute biventricular heart failure Recurrent cocaine use Elevated troponins likely secondary to supply demand ischemia in the setting of cardiomyopathy, atrial flutter, cocaine use, and heart failure exacerbation COPD on home O2. Acute on chronic kidney disease stage IIIb. Noncompliance  Plan: Atypical atrial flutter: Rate control: Carvedilol. Rhythm control: Amiodarone. Thromboembolic prophylaxis: Xarelto Telemetry notes underlying rhythm to be atrial flutter with improved ventricular rate. Transition from IV amiodarone to 200 mg p.o. daily. With regards to rate control strategy not a candidate for calcium channel blockers given her reduced LVEF or selective beta-blockers given her cocaine use.  We will cautiously use carvedilol for rate control strategy while here. CHA2DS2-VASc SCORE is 4 which correlates to 4.8% risk of stroke per year (CHF, HTN, Female, Age).  With regards to oral anticoagulation ([place]) for thromboembolic prophylaxis patient is educated of her risk per CHA2DS2-VASc SCORE. Discussed the risk, benefits, and alternative of [employer]. She is educated that [facility] would reduced her risk of thromboembolic events; however, she has illustrated on multiple occasion noncompliance and recurrent cocaine use so for those reasons I am concerned that risk of bleeding complications are greater than the benefit. She is leaning towards being on [facility] but I have asked her to consider the risk and benefits of [facility] prior to making her decision. She is advised to make an informed decision regarding anticoagulation given her risk of thromboembolic events and potential bleeding.  I asked if she would like me to discuss this with her next of kin (her son) but patient states they don't talk and would not like him involved. Patient states she  had a close friend [name] whom she trust but she does not provide consent to discuss [facility] management with him at this time.  For now she is on Xarelto while inpatient. Her nurse was present during this conversation.  Will discuss w/ her again tomorrow as well.   Acute biventricular heart failure:  Stage C, NYHA Class III.  Net IO Since Admission: -2,611.02 mL [11/16/21 1518] Continue IV Lasix.  Continue BiDil, Coreg w/ parameters  Her UOP over the last 24hr is improving monitor for now.  TED Hose  Recommend daily weight check, strict I/O's Will follow with you.   Acute Kidney Injury on CKD Stage IIIb:  Improving.  Monitor BUN and Cr.   Cocaine Use: Educated her on the importance of complete cessation.   Total time spent 35 minutes. Had a detail discussion regarding the risk/benefits/alternative to [facility] with the patient.  Plan of care discussed w/ nursing staff and primary team as well.   Patient's questions and concerns were addressed to her satisfaction. She voices understanding of the instructions provided during this encounter.   This note was created using a voice recognition software as a result there may be grammatical errors inadvertently enclosed that do not reflect the nature of this encounter. Every attempt is made to correct such errors.  Mechele Claude [facility]  Pager: [phone] Office: [phone] 11/16/2021, 3:18 PM

## 2021-11-16 NOTE — Hospital Course (Addendum)
Valerie Rodgers was admitted to the hospital with the working diagnosis of decompensated heart failure.   71 yo female with the past medical history of heart failure, cocaine use, alcohol abuse and atrial flutter who presented with dyspnea and edema. Reported worsening symptoms for the last 3 days, with palpitations, lower extremity edema and dyspnea. Positive cocaine use. On her initial physical examination her blood pressure was 109/83, HR 87, RR 23 and 02 saturation 100%, lungs with no wheezing or rhonchi, heart with S1 and S2 present and rhythmic, abdomen not distended, positive lower extremity edema.   Na 143, K 4,3 CL 111, bicarbonate 20, glucose 138, bun 21 cr 1,63 High sensitive troponin 89 and 116  Wbc 6,1 hgb 14,5 plt 151  UA SG 1,006,  Toxicology screen positive for cocaine.   Chest radiograph with cardiomegaly, positive hilar vascular congestion.   EKG 80 bpm, normal axis, normal qtc, atrial flutter rhythm, with no significant ST segment or T wave changes.   Patient was placed on furosemide for diuresis.  Heparin drip for anticoagulation and rate control with amiodarone.   Echocardiogram with significant reduction in systolic function.  Her volume status improved and was placed on guideline directed therapy for heart failure.   Patient declined oral anticoagulation for atrial fibrillation, considering her poor compliance, cocaine and alcohol abuse, she has an elevated risk of falls and bleeding.  Discontinue anticoagulation and follow up as outpatient.

## 2021-11-16 NOTE — Progress Notes (Signed)
Heart Failure Navigator Progress Note  Assessed for Heart & Vascular TOC clinic readiness.  Patient does not meet criteria due to established patient with [facility].     Rhae Hammock, BSN, Scientist, clinical (histocompatibility and immunogenetics) Only

## 2021-11-16 NOTE — Assessment & Plan Note (Signed)
Tobacco abuse.  Patient tested positive for cocaine on admission.

## 2021-11-16 NOTE — Progress Notes (Signed)
Progress Note   Patient: Valerie Rodgers [id number] DOB: [birth year] DOA: 11/13/2021     3 DOS: the patient was seen and examined on 11/16/2021   Brief hospital course: Valerie Rodgers was admitted to the hospital with the working diagnosis of decompensated heart failure.   71 yo female with the past medical history of heart failure, cocaine use, alcohol abuse and atrial flutter who presented with dyspnea and edema. Reported worsening symptoms for the last 3 days, with palpitations, lower extremity edema and dyspnea. Positive cocaine use. On her initial physical examination her blood pressure was 109/83, HR 87, RR 23 and 02 saturation 100%, lungs with no wheezing or rhonchi, heart with S1 and S2 present and rhythmic, abdomen not distended, positive lower extremity edema.   Na 143, K 4,3 CL 111, bicarbonate 20, glucose 138, bun 21 cr 1,63 High sensitive troponin 89 and 116  Wbc 6,1 hgb 14,5 plt 151  UA SG 1,006,  Toxicology screen positive for cocaine.   Chest radiograph with cardiomegaly, positive hilar vascular congestion.   EKG 80 bpm, normal axis, normal qtc, atrial flutter rhythm, with no significant ST segment or T wave changes.   Patient was placed on furosemide for diuresis.  Heparin drip for anticoagulation and rate control with amiodarone.   Echocardiogram with significant reduction in systolic function.   Assessment and Plan: * Acute on chronic combined systolic and diastolic CHF (congestive heart failure) (HCC) Echocardiogram with reduced LV systolic function with EF <20%, with global hypokinesis, mild LV enlarged cavity, severe reduction in RV systolic function, moderate enlargement in RV cavity, RVSP 47,9, severe LA dilatation. Moderate mitral regurgitation. Moderate to severe TR.   Urine output is 2,450 ml (note that input is 1,730 ml).  Blood pressure systolic is 123456 to 90 mmHg.  Elevated troponin due to heart failure exacerbation.   Plan to continue diuresis with  furosemide.  After load reduction with Bidil. .   Acute on chronic respiratory failure with hypoxia (HCC) Continue diuresis for acute pulmonary edema. Oxygen saturation is 100% on 2 L/min per [place] Continue oxymetry monitoring.    Chronic kidney disease, stage 3a (HCC) Hypokalemia.   Renal function with serum cr at 1,50 with K at 3,2 and serum bicarbonate at 30. Mg 1,6  Plan to continue K and Mg correction.  Diuresis with furosemide.  Follow up renal function and electrolytes in am.   Acute metabolic encephalopathy Patient is awake and alert. No confusion or agitation.  Continue neuro checks per unit protocol.   Atrial flutter, paroxysmal (HCC) Continue rate control with amiodarone and anticoagulation with rivaroxaban.  Continue telemetry monitoring.   Bipolar disorder ([place]) Will need outpatient follow up, currently off psychotropic medications.   Cocaine abuse (HCC) Tobacco abuse.  Patient tested positive for cocaine on admission.         Subjective: Patient is feeling better, but not back to her baseline, continue to have edema and dyspnea.   Physical Exam: Vitals:   11/15/21 2013 11/15/21 2225 11/16/21 0439 11/16/21 0738  BP: 101/69 (!) 113/101 (!) 129/101 104/81  Pulse:   83 (!) 48  Resp:    (!) 21  Temp:   (!) 97.4 F (36.3 C)   TempSrc:   Oral   SpO2:   100%   Weight:   84.1 kg   Height:       Neurology awake and alert. Positive anxiety  ENT with no pallor Cardiovascular with S1 and S2 present, with no gallops, or rubs,  positive systolic murmur at the right sternal border Positive JVD Positive lower extremity edema ++ pitting Respiratory with rales and wheezing, no rhonchi Abdomen not distended  Data Reviewed:    Family Communication: no family at the bedside   Disposition: Status is: Inpatient Remains inpatient appropriate because: heart failure   Planned Discharge Destination: Home    Author: Tawni Millers, MD 11/16/2021 12:30  PM  For on call review [email or web address].

## 2021-11-16 NOTE — Progress Notes (Signed)
Physical Therapy Treatment Patient Details Name: Valerie Rodgers MRN: [id number] DOB: 12-Feb-1951 Today's Date: 11/16/2021   History of Present Illness The pt is a 71 yo female presenting 5/28 with SOB, LE swelling, and heart palpitations. Of note, pt reports cocaine use just prior to calling EMS. Pt found to have CHF exacerbation and in afib. PMH includes: CHF, cocaine abuse, bipolar, CKD, cirrhosis of liver, COPD on chronic 2L O2.    PT Comments    The pt was seen for continued mobility progression, but presents with increased lethargy this morning. I was able to arouse with continued stimulation and change in position, but pt maintained eyes closed for much of session with flat affect and minimal verbalizations. The pt was able to complete sit-stand transfers and stand-pivot transfers with modA initially, but progressed to minG to stand and minA to pivot with continued cues for safety and reps. The pt will need supervision and assist for all mobility at current mobility status, if friends/family not able to provide she will need SNF rehab prior to return home.    VSS on 3L O2   Recommendations for follow up therapy are one component of a multi-disciplinary discharge planning process, led by the attending physician.  Recommendations may be updated based on patient status, additional functional criteria and insurance authorization.  Follow Up Recommendations  Home health PT (if friends can provide assist, may need SNF)     Assistance Recommended at Discharge Frequent or constant Supervision/Assistance  Patient can return home with the following Assistance with cooking/housework;Direct supervision/assist for medications management;Direct supervision/assist for financial management;Assist for transportation;Help with stairs or ramp for entrance;A lot of help with walking and/or transfers;A lot of help with bathing/dressing/bathroom   Equipment Recommendations  None recommended by PT     Recommendations for Other Services       Precautions / Restrictions Precautions Precautions: Fall;Other (comment) Precaution Comments: watch O2, supposed to use O2 at baseline, pt states she has it but does not use because she doesn't know how, watch HR Restrictions Weight Bearing Restrictions: No     Mobility  Bed Mobility Overal bed mobility: Needs Assistance Bed Mobility: Supine to Sit     Supine to sit: Min assist, HOB elevated     General bed mobility comments: initally maxA to LE as pt lethargic, minA when pt participating.    Transfers Overall transfer level: Needs assistance Equipment used: Rolling walker (2 wheels) Transfers: Sit to/from Stand, Bed to chair/wheelchair/BSC Sit to Stand: Mod assist, +2 physical assistance, Min guard   Step pivot transfers: Min assist       General transfer comment: initially modA to power up to standing, then progressed to minG to rise from [facility]. able to take small pivotal steps from bed-BSC and BSC-recliner with minA. pt with tendency to sit early and needs cues for safety    Ambulation/Gait               General Gait Details: limited to pivotal steps at this time      Balance Overall balance assessment: Needs assistance Sitting-balance support: Feet supported, No upper extremity supported Sitting balance-Leahy Scale: Good     Standing balance support: During functional activity Standing balance-Leahy Scale: Poor Standing balance comment: dependent on BUE support and Min A for balance                            Cognition Arousal/Alertness: Awake/alert Behavior During Therapy: [facility] for  tasks assessed/performed, Flat affect (difficult to engage at times) Overall Cognitive Status: Impaired/Different from baseline Area of Impairment: Orientation, Safety/judgement, Memory, Awareness, Attention                 Orientation Level: Disoriented to, Situation, Time Current Attention Level:  Sustained Memory: Decreased short-term memory   Safety/Judgement: Decreased awareness of safety, Decreased awareness of deficits Awareness: Intellectual   General Comments: Pt is poor historian. Speaks clearly at times, other times mumbles and garbled while other times does not answer and is difficult to arouse/engage.        Exercises      General Comments General comments (skin integrity, edema, etc.): VSS on 3L, pt reports SOB but SpO2 97%.      Pertinent Vitals/Pain Pain Assessment Pain Assessment: No/denies pain Faces Pain Scale: No hurt     PT Goals (current goals can now be found in the care plan section) Acute Rehab PT Goals Patient Stated Goal: return home tonight PT Goal Formulation: With patient Time For Goal Achievement: 11/28/21 Potential to Achieve Goals: Fair Progress towards PT goals: Progressing toward goals    Frequency    Min 3X/week      PT Plan Current plan remains appropriate       AM-PAC PT "6 Clicks" Mobility   Outcome Measure  Help needed turning from your back to your side while in a flat bed without using bedrails?: A Little Help needed moving from lying on your back to sitting on the side of a flat bed without using bedrails?: A Little Help needed moving to and from a bed to a chair (including a wheelchair)?: A Lot Help needed standing up from a chair using your arms (e.g., wheelchair or bedside chair)?: A Lot Help needed to walk in hospital room?: Total Help needed climbing 3-5 steps with a railing? : Total 6 Click Score: 12    End of Session Equipment Utilized During Treatment: Gait belt;Oxygen Activity Tolerance: Patient tolerated treatment well;Patient limited by fatigue Patient left: in chair;with call bell/phone within reach;with chair alarm set Nurse Communication: Mobility status PT Visit Diagnosis: Other abnormalities of gait and mobility (R26.89)     Time: 1751-0258 PT Time Calculation (min) (ACUTE ONLY): 32  min  Charges:  $Therapeutic Exercise: 8-22 mins                     Vickki Muff, PT, DPT   Acute Rehabilitation Department Pager #: [phone]   Ronnie Derby 11/16/2021, 11:36 AM

## 2021-11-16 NOTE — Assessment & Plan Note (Signed)
Pulmonary edema has improved, at the time of her discharge her oxygenation is 96% on 2 L/min per [place]. Plan to continue supplemental 02 per  at home.

## 2021-11-16 NOTE — Plan of Care (Signed)
  Problem: Clinical Measurements: Goal: Cardiovascular complication will be avoided Outcome: Progressing   Problem: Nutrition: Goal: Adequate nutrition will be maintained Outcome: Progressing   Problem: Coping: Goal: Level of anxiety will decrease Outcome: Progressing   Problem: Pain Managment: Goal: General experience of comfort will improve Outcome: Progressing   Problem: Safety: Goal: Ability to remain free from injury will improve Outcome: Progressing   

## 2021-11-16 NOTE — Evaluation (Signed)
Occupational Therapy Evaluation Patient Details Name: Valerie Rodgers MRN: [id number] DOB: [birth year] Today's Date: 11/16/2021   History of Present Illness The pt is a 71 yo female presenting 5/28 with SOB, LE swelling, and heart palpitations. Of note, pt reports cocaine use just prior to calling EMS. Pt found to have CHF exacerbation and in afib. PMH includes: CHF, cocaine abuse, bipolar, CKD, cirrhosis of liver, COPD on chronic 2L O2.   Clinical Impression    Pt was seen for OT assessment today with focus on bed moblity, grooming, bathing (after was noted to be wet in bed and unaware), sit <> stand transfers using RW, transfer to 3:1 and then chair. Pt participated in ADL session with increased encouragement to participate and verbal/tactile cues. Pt should benefit from acute OT to assist in maximizing independence with ADL and functional transfers. She is currently limited by cognitive deficits relating to awareness of deficits, safety, memory, initiation and attention. If pt does not progress and friend/family are not able to provide assist, may need SNF at d/c.     Recommendations for follow up therapy are one component of a multi-disciplinary discharge planning process, led by the attending physician.  Recommendations may be updated based on patient status, additional functional criteria and insurance authorization.   Follow Up Recommendations  Other (comment) (Dependent on pt level of support at home, may need SNF)    Assistance Recommended at Discharge Frequent or constant Supervision/Assistance  Patient can return home with the following A little help with walking and/or transfers;A lot of help with bathing/dressing/bathroom;Assistance with cooking/housework;Help with stairs or ramp for entrance    Functional Status Assessment  Patient has had a recent decline in their functional status and demonstrates the ability to make significant improvements in function in a reasonable and  predictable amount of time.  Equipment Recommendations  Other (comment) (Continue to assess)    Recommendations for Other Services       Precautions / Restrictions Precautions Precautions: Fall;Other (comment) Precaution Comments: watch O2, supposed to use O2 at baseline, pt states she has it but does not use because she doesn't know how, watch HR Restrictions Weight Bearing Restrictions: No      Mobility Bed Mobility Overal bed mobility: Needs Assistance Bed Mobility: Supine to Sit     Supine to sit: Min assist, HOB elevated Sit to supine: Min assist, HOB elevated   General bed mobility comments: initally maxA to LE as pt lethargic, minA when pt participating.    Transfers Overall transfer level: Needs assistance Equipment used: Rolling walker (2 wheels) Transfers: Sit to/from Stand, Bed to chair/wheelchair/BSC Sit to Stand: Mod assist, +2 physical assistance, Min guard     Step pivot transfers: Min assist     General transfer comment: initially modA to power up to standing, then progressed to minG to rise from [facility] able to take small pivotal steps from bed-BSC and BSC-recliner with minA. pt with tendency to sit early and needs cues for safety      Balance Overall balance assessment: Needs assistance Sitting-balance support: Feet supported, No upper extremity supported Sitting balance-Leahy Scale: Good   Standing balance support: During functional activity, Reliant on assistive device for balance Standing balance-Leahy Scale: Poor Standing balance comment: dependent on BUE support and Min A for balance     ADL either performed or assessed with clinical judgement   ADL Overall ADL's : Needs assistance/impaired Eating/Feeding: Set up;Bed level Eating/Feeding Details (indicate cue type and reason): Pt noted to spill food  on herself and in bed and appears unaware Grooming: Wash/dry face;Set up;Sitting;Minimal assistance Grooming Details (indicate cue type and  reason): Verbal and tactile cues to initiate Upper Body Bathing: Min guard;Sitting Upper Body Bathing Details (indicate cue type and reason): verbal/tactile cues to initiate while seated at EOB Lower Body Bathing: Sit to/from stand;Sitting/lateral leans;Moderate assistance Lower Body Bathing Details (indicate cue type and reason): Pt was noted to be wet/soakede in her bed with purwick container full (>1200 ml RN was made aware). Pt was unaware that she was wet and was difficult to arouse to assist to get cleaned up. She required moderate vc/tc's to engage in cleaning up Upper Body Dressing : Minimal assistance;Sitting   Lower Body Dressing: Maximal assistance;+2 for physical assistance;Sit to/from stand Lower Body Dressing Details (indicate cue type and reason): +2 physical assist to remove soiled/wet underwear and clean up pt while pt stood with RW Toilet Transfer: Minimal assistance;+2 for safety/equipment;+2 for physical assistance;Rolling walker (2 wheels);Ambulation;BSC/3in1 [employer] forces technical officer Details (indicate cue type and reason): Transfer from EOB to 3:1 and then to chair using RW Toileting- Clothing Manipulation and Hygiene: Maximal assistance;+2 for physical assistance;+2 for safety/equipment Toileting - Clothing Manipulation Details (indicate cue type and reason): +2 physical assist to remove soiled/wet underwear and clean up pt while pt stood with RW     Functional mobility during ADLs: Minimal assistance;+2 for safety/equipment;Rolling walker (2 wheels) General ADL Comments: Pt was seen for OT assessment today with focus on bed moblity, grooming, bathing (after was noted to be wet in bed and unaware), sit <> stand transfers using RW, transfer to 3:1 and then chair. Pt participated in ADL session with increased encouragement to participate and verbal/tactile cues. Pt should benefit from acute OT to assist in maximizing independence with ADL and functional transfers. She is currently limited  by cognitive deficits relating to awareness of deficits, safety awareness, memory, initiation and attention. If pt does not progress and friend/family are not able to provide assist, may need SNF at d/c.     Vision   Vision Assessment?: No apparent visual deficits            Pertinent Vitals/Pain Pain Assessment Pain Assessment: No/denies pain Faces Pain Scale: No hurt     Hand Dominance Right   Extremity/Trunk Assessment Upper Extremity Assessment Upper Extremity Assessment: Generalized weakness;Overall [facility] for tasks assessed   Lower Extremity Assessment Lower Extremity Assessment: Defer to PT evaluation;Overall WFL for tasks assessed       Communication Communication Communication: Expressive difficulties (mumbled and garbled at times, while other times clear)   Cognition Arousal/Alertness: Awake/alert Behavior During Therapy: WFL for tasks assessed/performed, Flat affect (difficult to engage at times) Overall Cognitive Status: Impaired/Different from baseline Area of Impairment: Orientation, Safety/judgement, Memory, Awareness, Attention   Orientation Level: Disoriented to, Situation, Time Current Attention Level: Sustained Memory: Decreased short-term memory   Safety/Judgement: Decreased awareness of safety, Decreased awareness of deficits Awareness: Intellectual   General Comments: Pt is poor historian. Speaks clearly at times, other times mumbles and garbled while other times does not answer and is difficult to arouse/engage.     General Comments  VSS on 3L, pt reports SOB but SpO2 97%            Home Living Family/patient expects to be discharged to:: Private residence Living Arrangements: Other relatives;Non-relatives/Friends (lives with cousin and a friend) Available Help at Discharge: Friend(s);Available PRN/intermittently Type of Home: House Home Access: Other (comment) (Unclear, pt not answering consistently)  Home Layout: Other (Comment)  (unclear, pt doesn't answer)   Additional Comments: pt unreliable historian      Prior Functioning/Environment Prior Level of Function : Independent/Modified Independent     Mobility Comments: pt reports she "sometimes" uses RW, sometimes nothing ADLs Comments: pt reports independence with ADLs and IADLs such as cooking        OT Problem List: Decreased safety awareness;Impaired balance (sitting and/or standing);Decreased activity tolerance;Decreased cognition;Cardiopulmonary status limiting activity;Decreased knowledge of precautions;Decreased knowledge of use of DME or AE      OT Treatment/Interventions: Self-care/ADL training;Patient/family education;Therapeutic activities;DME and/or AE instruction    OT Goals(Current goals can be found in the care plan section) Acute Rehab OT Goals Patient Stated Goal: None stated OT Goal Formulation: Patient unable to participate in goal setting Time For Goal Achievement: 11/30/21 Potential to Achieve Goals: Fair  OT Frequency: Min 2X/week    Co-evaluation PT/OT/SLP Co-Evaluation/Treatment: Yes Reason for Co-Treatment: Complexity of the patient's impairments (multi-system involvement);To address functional/ADL transfers;For patient/therapist safety PT goals addressed during session: Mobility/safety with mobility;Proper use of DME OT goals addressed during session: ADL's and self-care;Proper use of Adaptive equipment and DME      AM-PAC OT "6 Clicks" Daily Activity     Outcome Measure Help from another person eating meals?: A Little Help from another person taking care of personal grooming?: A Little Help from another person toileting, which includes using toliet, bedpan, or urinal?: A Lot Help from another person bathing (including washing, rinsing, drying)?: A Lot Help from another person to put on and taking off regular upper body clothing?: A Little Help from another person to put on and taking off regular lower body clothing?: A  Lot 6 Click Score: 15   End of Session Equipment Utilized During Treatment: Gait belt;Rolling walker (2 wheels);Oxygen Nurse Communication: Mobility status;Other (comment) (Pt up in chair, ADL's completed, wet bed and linens need to be changed)  Activity Tolerance: Patient limited by fatigue;Patient tolerated treatment well Patient left: in chair;with call bell/phone within reach;with chair alarm set  OT Visit Diagnosis: Muscle weakness (generalized) (M62.81);Unsteadiness on feet (R26.81)                Time: [id number] OT Time Calculation (min): 33 min Charges:  OT General Charges $OT Visit: 1 Visit OT Evaluation $OT Eval Moderate Complexity: 1 Mod  Maclovia Uher Beth Dixon, OTR/L 11/16/2021, 11:48 AM

## 2021-11-16 NOTE — Assessment & Plan Note (Signed)
Will need outpatient follow up, currently off psychotropic medications.

## 2021-11-16 NOTE — Progress Notes (Addendum)
Palliative Medicine Inpatient Follow Up Note  HPI: 71 y.o. female   admitted on 11/13/2021 with  past medical history of CHF, CKD, cirrhosis of the, liver COPD on chronic oxygen,Been bipolar disease, substance misuse disorder, who presented to [facility] ED on 528 with dyspnea and lower extremity edema. CXR in ED showed mild diffuse interstitial prominence, indicative of either mild interstitial edema or sequela of CHF exacerbations.  She was tachypnea, BNP was 1828,Troponin 122 and UDS was positive for cocaine. Currently patient is obtunded and does not have medical decision-making capacity. Patient faces treatment option decision, advanced directive decisions and anticipatory care needs.  Today's Discussion 11/16/2021  *Please note that this is a verbal dictation therefore any spelling or grammatical errors are due to the "[place] [employer]" system interpretation.  Chart reviewed inclusive of vital signs, progress notes, laboratory results, and diagnostic images.   I met with Valerie Rodgers at bedside this morning.  She was resting comfortably in no acute distress.  She shares with me that she is feeling anxious to get out of the hospital. Created space and opportunity for patient to explore thoughts feelings and fears regarding current medical situation.  I introduced the topic of a MOST form and completed this with her per the following wishes:  Cardiopulmonary Resuscitation: Do Not Attempt Resuscitation (DNR/No CPR)  Medical Interventions: Limited Additional Interventions: Use medical treatment, IV fluids and cardiac monitoring as indicated, DO NOT USE intubation or mechanical ventilation. May consider use of less invasive airway support such as BiPAP or CPAP. Also provide comfort measures. Transfer to the hospital if indicated. Avoid intensive care.   Antibiotics: Determine use of limitation of antibiotics when infection occurs  IV Fluids: IV fluids for a defined trial period  Feeding Tube: No  feeding tube   We also reviewed the importance of identifying a surrogate decision maker if patient should ever be incoherent.  Valerie Rodgers identifies her friend Valerie Rodgers to be the person she would wish to make decisions for her.  I shared that palliative care is a specialized care for people with serious illness.  We reviewed that it is appropriate for any age and at any stage of a serious illness.  We discussed that palliative care can be provided along with curative treatments.  Reviewed that palliative care is care directed by patient goals and aimed at improving quality of life for both the patient and their family.  I shared the difference between inpatient and outpatient palliative support.  Valerie Rodgers is agreeable to outpatient palliative support on discharge.  Questions and concerns addressed   Palliative Support Provided ______________________________________ Addendum:  I called and updated patients friend, Valerie Rodgers - he is aware of patients desire to name him as her surrogate decision maker and agrees to this.  Objective Assessment: Vital Signs Vitals:   11/16/21 0439 11/16/21 0738  BP: (!) 129/101 104/81  Pulse: 83 (!) 48  Resp:  (!) 21  Temp: (!) 97.4 F (36.3 C)   SpO2: 100%     Intake/Output Summary (Last 24 hours) at 11/16/2021 1218 Last data filed at 11/16/2021 1007 Gross per 24 hour  Intake 1730.4 ml  Output 3600 ml  Net -1869.6 ml   Last Weight  Most recent update: 11/16/2021  4:51 AM    Weight  84.1 kg (185 lb 8 oz)            Gen: Elderly African-American female in no acute distress HEENT: moist mucous membranes CV: Irregular rate and rhythm PULM:  On 2 L nasal cannula, breathing slightly labored ABD: soft/nontender EXT: No edema Neuro: Alert and oriented x3  SUMMARY OF RECOMMENDATIONS   DNAR/DNI  MOST Completed, paper copy placed onto the chart electric copy can be found in [facility]  DNR Form Completed, paper copy placed onto the chart electric copy  can be found in [place] chaplain assisting with completion of advance directives  Patient is open to OP Palliative support  Palliative care will continue to follow until discharge  Time Spent: 65  Billing based on MDM: High  Problems Addressed: One acute or chronic illness or injury that poses a threat to life or bodily function  Amount and/or Complexity of Data: Category 3:Discussion of management or test interpretation with external physician/other qualified health care professional/appropriate source (not separately reported)  Risks: Decision not to resuscitate or to de-escalate care because of poor prognosis ______________________________________________________________________________________ [place] Team Team Cell Phone: [phone] Please utilize secure chat with additional questions, if there is no response within 30 minutes please call the above phone number  Palliative Medicine Team providers are available by phone from 7am to 7pm daily and can be reached through the team cell phone.  Should this patient require assistance outside of these hours, please call the patient's attending physician.

## 2021-11-16 NOTE — Assessment & Plan Note (Signed)
Hypokalemia.   Renal function with serum cr at 1,50 with K at 3,2 and serum bicarbonate at 30. Mg 1,6  Plan to continue K and Mg correction.  Diuresis with furosemide.  Follow up renal function and electrolytes in am.

## 2021-11-16 NOTE — Assessment & Plan Note (Signed)
Patient is awake and alert. No confusion or agitation.  Encephalopathy has resolved.

## 2021-11-16 NOTE — Progress Notes (Signed)
Heart Failure Stewardship Pharmacist Progress Note   PCP: Pcp, No PCP-Cardiologist: None    HPI:  71 yo F with PMH of CKD, cirrhosis of liver, COPD on chronic O2, bipolar, cocaine use, and systolic CHF who presented to [facility] ED 05/28 with dyspnea and LEE over past 3 days.    She was admitted 01/29 through 02/04 for an acute on chronic CHF exacerbation secondary to cocaine use.  Diuresed with net negative -1.11L at discharge weighing 160.6 lbs.  Echo on 01/30 during that admission revealed newly reduced LVEF of 25-30% with G3DD.  Stress test on 02/02 was negative for inducible ischemia with evidence of global hypokinesis and marked LVE.  She was discharged on Entresto, Corlanor, and Lasix 40 mg daily; spironolactone was initiated during hospital stay but discontinued with hyperkalemia of 5.2 (Lokelma given).  Her PTA amlodipine and HCTZ were discontinued.  Followed up with [facility] 02/16 where she admitted to continued cocaine use and reported that she was still taking the HCTZ and amlodipine and was nonadherent to Corlanor.    At 03/02 cardiology follow-up visit, she was still using cocaine but denied dyspnea, orthopnea or LEE.  Spironolactone 12.5 mg daily was added.  Still no beta-blockers with persistent cocaine use.    Now, she is admitted for another CHF exacerbation secondary to cocaine use.  CXR in the ED showed mild diffuse interstitial prominence, indicative of either mild interstitial edema or sequela of CHF exacerbations.  Echo on 05/30 revealed severely reduced EF of <20%, severely reduced RV function with elevated pulmonary pressures, moderate-severe biatrial dilation, and moderate-severe TR - all new since Jan admission.  The G3DD and elevated LVEDP remains the same.    She remained volume overload with JVD and 2+ LEE evident on exam 05/30.  Carvedilol 3.125 mg BID was initiated and she received one dose before it was discontinued due to her severe biventricular failure and  fluid still on board.    Current HF Medications: Diuretic: furosemide IV 40 mg q12h  Prior to admission HF Medications: Not taking any PTA medications - most Rx's last filled in February. Entresto filled in March and April.  Prescribed the following -  Diuretic: Lasix 40 mg daily ACE/ARB/ARNI: Entresto 24-26 mg BID Aldosterone Antagonist: Spironolactone 12.5 mg daily  Other: Corlanor 5 mg BID  Pertinent Lab Values: Serum creatinine 1.61 > 1.5 (baseline ~1.2), BUN 21, Potassium 3.2, Sodium 140, BNP 1828, Magnesium 1.6, A1c 6%  Vital Signs: Weight: 185 lbs (admission weight: 188 lbs) Blood pressure: 100-20s/80-100s  Heart rate: 80-90 - HR occasionally dropping to 40-50s I/O: -1.5L yesterday; net 1.05 L  Medication Assistance / Insurance Benefits Check: Does the patient have prescription insurance?  Yes Type of insurance plan: UHC Medicare / Medicaid [place]:  Prior to admission outpatient [facility] Is the patient willing to use [facility] at discharge? Yes Is the patient willing to transition their outpatient pharmacy to utilize a [facility] outpatient pharmacy?   Pending    Assessment: 1. Acute on chronic systolic CHF (LVEF <97%), due to ongoing cocaine use. NYHA class II-III symptoms. Renal function improving slightly.  Down to 2L [place] from 5L yesterday.  Weight down 3 lbs since admission, only 1 lb yesterday.   - Furosemide IV 40 mg q12h - No beta-blocker with ongoing cocaine use and severe biventricular failure with EF <20% - Acute on chronic kidney injury limiting GDMT initiation - no MRA / SGLT2i / RAAS agent - No  digoxin with AKI - would consider for RV support if renal function normalizes - Significant hypokalemia (K 3.2) and hypomagnesemia (Mg 1.6)    Plan: 1) Medication changes recommended at this time: - INCREASE Lasix to IV 40 mg TID for additional diuresis  - Recheck BMP after 40 mEq x2 potassium and 2g IV Mg given per  overnight MD - Daily BMP and Mg - Consider adding SGLT2i if SCr improves to baseline of 1.2 (eGFR currently 37) - Recommend Life[facility] before discharge with severely reduced LVEF  2) Patient assistance: - none pending  3)  Education  - To be completed prior to discharge  Laurey Arrow, PharmD PGY1 Pharmacy Resident 11/16/2021  7:12 AM

## 2021-11-17 LAB — BASIC METABOLIC PANEL
Anion gap: 7 (ref 5–15)
BUN: 18 mg/dL (ref 8–23)
CO2: 30 mmol/L (ref 22–32)
Calcium: 8.7 mg/dL — ABNORMAL LOW (ref 8.9–10.3)
Chloride: 105 mmol/L (ref 98–111)
Creatinine, Ser: 1.43 mg/dL — ABNORMAL HIGH (ref 0.44–1.00)
GFR, Estimated: 39 mL/min — ABNORMAL LOW (ref >60)
Glucose, Bld: 96 mg/dL (ref 70–99)
Potassium: 4.1 mmol/L (ref 3.5–5.1)
Sodium: 142 mmol/L (ref 135–145)

## 2021-11-17 LAB — CBC
HCT: 40.3 % (ref 36.0–46.0)
Hemoglobin: 13.4 g/dL (ref 12.0–15.0)
MCH: 27.8 pg (ref 26.0–34.0)
MCHC: 33.3 g/dL (ref 30.0–36.0)
MCV: 83.6 fL (ref 80.0–100.0)
Platelets: 196 10*3/uL (ref 150–400)
RBC: 4.82 MIL/uL (ref 3.87–5.11)
RDW: 15.7 % — ABNORMAL HIGH (ref 11.5–15.5)
WBC: 5.7 10*3/uL (ref 4.0–10.5)
nRBC: 0 % (ref 0.0–0.2)

## 2021-11-17 LAB — MAGNESIUM: Magnesium: 1.9 mg/dL (ref 1.7–2.4)

## 2021-11-17 MED ORDER — IVABRADINE HCL 5 MG PO TABS
5.0000 mg | ORAL_TABLET | Freq: Two times a day (BID) | ORAL | Status: DC
  Administered 2021-11-17 – 2021-11-18 (×2): 5 mg via ORAL
  Filled 2021-11-17 (×4): qty 1

## 2021-11-17 MED ORDER — ATORVASTATIN CALCIUM 10 MG PO TABS
20.0000 mg | ORAL_TABLET | Freq: Every day | ORAL | Status: DC
  Administered 2021-11-18: 20 mg via ORAL
  Filled 2021-11-17: qty 2

## 2021-11-17 MED ORDER — MAGNESIUM SULFATE 2 GM/50ML IV SOLN
2.0000 g | Freq: Once | INTRAVENOUS | Status: AC
  Administered 2021-11-17: 2 g via INTRAVENOUS
  Filled 2021-11-17: qty 50

## 2021-11-17 MED ORDER — FUROSEMIDE 10 MG/ML IJ SOLN
40.0000 mg | Freq: Three times a day (TID) | INTRAMUSCULAR | Status: DC
  Administered 2021-11-17 – 2021-11-18 (×3): 40 mg via INTRAVENOUS
  Filled 2021-11-17 (×3): qty 4

## 2021-11-17 MED ORDER — AMIODARONE HCL 200 MG PO TABS
200.0000 mg | ORAL_TABLET | Freq: Two times a day (BID) | ORAL | Status: DC
  Administered 2021-11-17: 200 mg via ORAL
  Filled 2021-11-17: qty 1

## 2021-11-17 NOTE — Progress Notes (Signed)
This chaplain is present with the Pt. for notarizing of the Pt. Advance Directive: HCPOA and Living Will. The chaplain understands AD education was provided by the PMT. The Pt. was able to answer the chaplain's clarifying questions.  The chaplain is present with the Pt., notary, and witnesses for the notarizing of the Pt. AD.  The Pt. chose Lucilla Edin as her healthcare agent.  The chaplain gave the Pt. the original AD and one copy. The chaplain scanned the Pt. AD into her EMR.  This chaplain is available for F/U spiritual care as needed.  Chaplain Stephanie Acre [phone]

## 2021-11-17 NOTE — Progress Notes (Signed)
[facility] ([facility]) Hospital Liaison Note  Notified by TOC manager of patient/family request for [facility] palliative services at home after discharge.   [facility] hospital liaison will follow patient for discharge disposition.   Please call with any hospice or outpatient palliative care related questions.   Thank you for the opportunity to participate in this patient's care.   Shanita Wicker, LCSW [facility] Hospital Liaison [phone]  

## 2021-11-17 NOTE — Care Management Important Message (Signed)
Important Message  Patient Details  Name: Valerie Rodgers MRN: [id number] Date of Birth: 1950/11/27   Medicare Important Message Given:  Yes     Valerie, Rodgers 11/17/2021, 8:56 AM

## 2021-11-17 NOTE — Progress Notes (Signed)
Physical Therapy Treatment Patient Details Name: Valerie Rodgers MRN: [id number] DOB: 04-Oct-1950 Today's Date: 11/17/2021   History of Present Illness The pt is a 71 yo female presenting 5/28 with SOB, LE swelling, and heart palpitations. Of note, pt reports cocaine use just prior to calling EMS. Pt found to have CHF exacerbation and in afib. PMH includes: CHF, cocaine abuse, bipolar, CKD, cirrhosis of liver, COPD on chronic 2L O2.    PT Comments    PT returned due to nursing staff request to assist the pt back to bed. Upon arrival, chair alarm going off and pt states she just used [facility] on her own. The pt was safe, seated in recliner upon my arrival, but with lines tangled around her legs with pt stating she had not noticed she was tangled. The pt was able to complete sit-stand transfer from recliner with minG and pivot to bed with minA to steady and manage lines. Upon returning to bed, pt found to have 2 lighters and small container of marijuana in her purse, NT and RN alerted (see RN note for more detail). Pt educated on inherent danger of lighters or smoking with use of O2.     Recommendations for follow up therapy are one component of a multi-disciplinary discharge planning process, led by the attending physician.  Recommendations may be updated based on patient status, additional functional criteria and insurance authorization.  Follow Up Recommendations  Home health PT (if friends can provide assist, if not may need SNF)     Assistance Recommended at Discharge Frequent or constant Supervision/Assistance  Patient can return home with the following Assistance with cooking/housework;Direct supervision/assist for medications management;Direct supervision/assist for financial management;Assist for transportation;Help with stairs or ramp for entrance;A lot of help with walking and/or transfers;A lot of help with bathing/dressing/bathroom   Equipment Recommendations  None recommended by PT     Recommendations for Other Services       Precautions / Restrictions Precautions Precautions: Fall;Other (comment) Precaution Comments: watch O2, supposed to use O2 at baseline, pt states she has it but does not use because she doesn't know how, watch HR Restrictions Weight Bearing Restrictions: No     Mobility  Bed Mobility Overal bed mobility: Needs Assistance Bed Mobility: Sit to Supine     Supine to sit: Min guard Sit to supine: Min guard   General bed mobility comments: minG with increased time    Transfers Overall transfer level: Needs assistance Equipment used: Rolling walker (2 wheels) Transfers: Sit to/from Stand, Bed to chair/wheelchair/BSC Sit to Stand: Min assist   Step pivot transfers: Min assist       General transfer comment: minA to power up with cues for hand placement. slow to rise    Ambulation/Gait Ambulation/Gait assistance: Min assist Gait Distance (Feet): 20 Feet Assistive device: Rolling walker (2 wheels) Gait Pattern/deviations: Step-to pattern, Decreased stride length, Trunk flexed Gait velocity: decreased Gait velocity interpretation: <1.31 ft/sec, indicative of household ambulator   General Gait Details: limited to pivot back to bed due to pt pain      Balance Overall balance assessment: Needs assistance Sitting-balance support: Feet supported, No upper extremity supported Sitting balance-Leahy Scale: Good     Standing balance support: During functional activity, Reliant on assistive device for balance Standing balance-Leahy Scale: Poor Standing balance comment: dependent on BUE support and Min A for balance  Cognition Arousal/Alertness: Awake/alert Behavior During Therapy: WFL for tasks assessed/performed, Flat affect Overall Cognitive Status: Impaired/Different from baseline Area of Impairment: Orientation, Safety/judgement, Memory, Awareness, Attention                  Orientation Level: Disoriented to, Time, Situation Current Attention Level: Sustained Memory: Decreased short-term memory   Safety/Judgement: Decreased awareness of safety, Decreased awareness of deficits Awareness: Intellectual   General Comments: pt following cues but with poor safety awarness, pt reports she just got up to [facility] and used the bathroom by herself, lines tangled around her legs. once alerted to the problems, pt agreed there were safety concerns. did not notice on her own        Exercises      General Comments General comments (skin integrity, edema, etc.): pt found to have 2 lighters and small bag of weed during session, NT and RN alerted to collect substances      Pertinent Vitals/Pain Pain Assessment Pain Assessment: Faces Faces Pain Scale: Hurts a little bit Pain Location: LE stiffness from being in chair Pain Descriptors / Indicators: Discomfort Pain Intervention(s): Limited activity within patient's tolerance, Monitored during session, Repositioned     PT Goals (current goals can now be found in the care plan section) Acute Rehab PT Goals Patient Stated Goal: return home tonight PT Goal Formulation: With patient Time For Goal Achievement: 11/28/21 Potential to Achieve Goals: Fair Progress towards PT goals: Progressing toward goals    Frequency    Min 3X/week      PT Plan Current plan remains appropriate       AM-PAC PT "6 Clicks" Mobility   Outcome Measure  Help needed turning from your back to your side while in a flat bed without using bedrails?: A Little Help needed moving from lying on your back to sitting on the side of a flat bed without using bedrails?: A Little Help needed moving to and from a bed to a chair (including a wheelchair)?: A Lot Help needed standing up from a chair using your arms (e.g., wheelchair or bedside chair)?: A Lot Help needed to walk in hospital room?: A Lot Help needed climbing 3-5 steps with a railing? :  Total 6 Click Score: 13    End of Session Equipment Utilized During Treatment: Gait belt;Oxygen Activity Tolerance: Patient tolerated treatment well;Patient limited by fatigue Patient left: with call bell/phone within reach;in bed;with bed alarm set Nurse Communication: Mobility status PT Visit Diagnosis: Other abnormalities of gait and mobility (R26.89)     Time: [id number] PT Time Calculation (min) (ACUTE ONLY): 14 min  Charges:  $Therapeutic Exercise: 8-22 mins                     West Carbo, PT, DPT   Acute Rehabilitation Department Pager #: [phone]   Sandra Cockayne 11/17/2021, 3:41 PM

## 2021-11-17 NOTE — Progress Notes (Signed)
Physical Therapy Treatment Patient Details Name: Valerie Rodgers MRN: [id number] DOB: 03/22/1951 Today's Date: 11/17/2021   History of Present Illness The pt is a 71 yo female presenting 5/28 with SOB, LE swelling, and heart palpitations. Of note, pt reports cocaine use just prior to calling EMS. Pt found to have CHF exacerbation and in afib. PMH includes: CHF, cocaine abuse, bipolar, CKD, cirrhosis of liver, COPD on chronic 2L O2.    PT Comments    The pt presents more alert and engaged this morning, eager to get OOB. She was able to complete sit-stand transfers with minA and cues for hand placement. She then was able to complete short bout of ambulation in the room (~20 ft total) with x3 standing rest breaks due to fatigue. The pt was then able to complete x5 sit-stand transfers from recliner with minA to minG after seated rest. Will continue to benefit from skilled PT to progress functional activity tolerance and independence but is making good progress at this time.    Recommendations for follow up therapy are one component of a multi-disciplinary discharge planning process, led by the attending physician.  Recommendations may be updated based on patient status, additional functional criteria and insurance authorization.  Follow Up Recommendations  Home health PT (if friends can provide assist, if not may need SNF)     Assistance Recommended at Discharge Frequent or constant Supervision/Assistance  Patient can return home with the following Assistance with cooking/housework;Direct supervision/assist for medications management;Direct supervision/assist for financial management;Assist for transportation;Help with stairs or ramp for entrance;A lot of help with walking and/or transfers;A lot of help with bathing/dressing/bathroom   Equipment Recommendations  None recommended by PT    Recommendations for Other Services       Precautions / Restrictions Precautions Precautions: Fall;Other  (comment) Precaution Comments: watch O2, supposed to use O2 at baseline, pt states she has it but does not use because she doesn't know how, watch HR Restrictions Weight Bearing Restrictions: No     Mobility  Bed Mobility Overal bed mobility: Needs Assistance Bed Mobility: Supine to Sit     Supine to sit: Min guard     General bed mobility comments: minG with increased time    Transfers Overall transfer level: Needs assistance Equipment used: Rolling walker (2 wheels) Transfers: Sit to/from Stand, Bed to chair/wheelchair/BSC Sit to Stand: Min assist   Step pivot transfers: Min assist       General transfer comment: minA to power up with cues for hand placement. slow to rise    Ambulation/Gait Ambulation/Gait assistance: Min assist Gait Distance (Feet): 20 Feet Assistive device: Rolling walker (2 wheels) Gait Pattern/deviations: Step-to pattern, Decreased stride length, Trunk flexed Gait velocity: decreased Gait velocity interpretation: <1.31 ft/sec, indicative of household ambulator   General Gait Details: pt limited to total of 20 ft, x3 standing rest breaks due to fatigue. small steps with trunk flexed. dependent on RW      Balance Overall balance assessment: Needs assistance Sitting-balance support: Feet supported, No upper extremity supported Sitting balance-Leahy Scale: Good     Standing balance support: During functional activity, Reliant on assistive device for balance Standing balance-Leahy Scale: Poor Standing balance comment: dependent on BUE support and Min A for balance                            Cognition Arousal/Alertness: Awake/alert Behavior During Therapy: WFL for tasks assessed/performed, Flat affect Overall Cognitive Status: Impaired/Different from  baseline Area of Impairment: Orientation, Safety/judgement, Memory, Awareness, Attention                 Orientation Level: Disoriented to, Time, Situation Current Attention  Level: Sustained Memory: Decreased short-term memory   Safety/Judgement: Decreased awareness of safety, Decreased awareness of deficits Awareness: Intellectual   General Comments: pt able to follow cues, does not recall therapist from prior sessions. highly motivated at this time        Exercises      General Comments General comments (skin integrity, edema, etc.): VSS on 3L      Pertinent Vitals/Pain Pain Assessment Pain Assessment: No/denies pain     PT Goals (current goals can now be found in the care plan section) Acute Rehab PT Goals Patient Stated Goal: return home tonight PT Goal Formulation: With patient Time For Goal Achievement: 11/28/21 Potential to Achieve Goals: Fair Progress towards PT goals: Progressing toward goals    Frequency    Min 3X/week      PT Plan Current plan remains appropriate       AM-PAC PT "6 Clicks" Mobility   Outcome Measure  Help needed turning from your back to your side while in a flat bed without using bedrails?: A Little Help needed moving from lying on your back to sitting on the side of a flat bed without using bedrails?: A Little Help needed moving to and from a bed to a chair (including a wheelchair)?: A Lot Help needed standing up from a chair using your arms (e.g., wheelchair or bedside chair)?: A Lot Help needed to walk in hospital room?: A Lot Help needed climbing 3-5 steps with a railing? : Total 6 Click Score: 13    End of Session Equipment Utilized During Treatment: Gait belt;Oxygen Activity Tolerance: Patient tolerated treatment well;Patient limited by fatigue Patient left: in chair;with call bell/phone within reach;with chair alarm set Nurse Communication: Mobility status PT Visit Diagnosis: Other abnormalities of gait and mobility (R26.89)     Time: [id number] PT Time Calculation (min) (ACUTE ONLY): 19 min  Charges:  $Therapeutic Exercise: 8-22 mins                     Vickki Muff, PT, DPT   Acute  Rehabilitation Department Pager #: [phone]   Ronnie Derby 11/17/2021, 3:24 PM

## 2021-11-17 NOTE — Progress Notes (Addendum)
Progress Note   Patient: Valerie Rodgers [id number] DOB: 30-Jan-1951 DOA: 11/13/2021     4 DOS: the patient was seen and examined on 11/17/2021   Brief hospital course: Mrs. Blonder was admitted to the hospital with the working diagnosis of decompensated heart failure.   71 yo female with the past medical history of heart failure, cocaine use, alcohol abuse and atrial flutter who presented with dyspnea and edema. Reported worsening symptoms for the last 3 days, with palpitations, lower extremity edema and dyspnea. Positive cocaine use. On her initial physical examination her blood pressure was 109/83, HR 87, RR 23 and 02 saturation 100%, lungs with no wheezing or rhonchi, heart with S1 and S2 present and rhythmic, abdomen not distended, positive lower extremity edema.   Na 143, K 4,3 CL 111, bicarbonate 20, glucose 138, bun 21 cr 1,63 High sensitive troponin 89 and 116  Wbc 6,1 hgb 14,5 plt 151  UA SG 1,006,  Toxicology screen positive for cocaine.   Chest radiograph with cardiomegaly, positive hilar vascular congestion.   EKG 80 bpm, normal axis, normal qtc, atrial flutter rhythm, with no significant ST segment or T wave changes.   Patient was placed on furosemide for diuresis.  Heparin drip for anticoagulation and rate control with amiodarone.   Echocardiogram with significant reduction in systolic function.   Clinically improving volume status.  Assessment and Plan: * Acute on chronic combined systolic and diastolic CHF (congestive heart failure) (HCC) Echocardiogram with reduced LV systolic function with EF <20%, with global hypokinesis, mild LV enlarged cavity, severe reduction in RV systolic function, moderate enlargement in RV cavity, RVSP 47,9, severe LA dilatation. Moderate mitral regurgitation. Moderate to severe TR.   Urine output is 4,200 ml (note that input is 1,025ml).  Blood pressure systolic is [id number] to 123XX123 mmHg.  Elevated troponin due to heart failure exacerbation.    Continue with furosemide and Bidil with good toleration. Started on Ivabradine  Possible transition to oral diuretic therapy in the next 24 hrs.   Acute on chronic respiratory failure with hypoxia (HCC) Continue diuresis for acute pulmonary edema. Oxygen saturation is 100% on 2 L/min per [place] Continue oxymetry monitoring.    Chronic kidney disease, stage 3a (HCC) Hypokalemia, hypomagnesemia   Patient with improved volume status, renal function with serum cr 1,43 with K at 4,1 and serum bicarbonate at 30.  Mg 1,9  Add mag IV 2 g today Follow up renal function in am, continue with IV furosemide for diuresis.    Acute metabolic encephalopathy Patient is awake and alert. No confusion or agitation.  Continue neuro checks per unit protocol.   Atrial flutter, paroxysmal (HCC) Continue rate control with amiodarone and anticoagulation with rivaroxaban.  Continue telemetry monitoring.   Bipolar disorder ([place]) Will need outpatient follow up, currently off psychotropic medications.   Cocaine abuse (HCC) Tobacco abuse.  Patient tested positive for cocaine on admission.         Subjective: patient is feeling better, dyspnea is improving, no chest pain, tolerating po well.   Physical Exam: Vitals:   11/17/21 0427 11/17/21 0736 11/17/21 0806 11/17/21 1027  BP: 107/85  117/81 106/69  Pulse: 87  (!) 55 97  Resp: (!) 24  20   Temp: (!) 97.5 F (36.4 C)  98.7 F (37.1 C)   TempSrc: Oral  Oral   SpO2: 94% 96% 92%   Weight:      Height:       Neurology awake and alert ENT with  no pallor Cardiovascular with S1 and S2 present and rhythmic with no gallops or murmurs, irregularly irregular No JVD Lower extremity with + edema non pitting, compression stockings in place Respiratory with rales at bases bilaterally Abdomen not distended  Data Reviewed:    Family Communication: no family at the bedside   Disposition: Status is: Inpatient Remains inpatient appropriate  because: heart failure   Planned Discharge Destination: Home    Author: Tawni Millers, MD 11/17/2021 1:41 PM  For on call review [email or web address].

## 2021-11-17 NOTE — TOC Initial Note (Addendum)
Transition of Care [facility]) - Initial/Assessment Note    Patient Details  Name: Valerie Rodgers MRN: [id number] Date of Birth: 09-Nov-1950  Transition of Care [facility]) CM/SW Contact:    Marilu Favre, RN Phone Number: 11/17/2021, 12:41 PM  Clinical Narrative:                 PT recommendations HHPT if friends/family can provide assistance.   NCM spoke to friend [name] [id number] . NCM read PT note to [place]. Patient lives with two people, [name] states they can provide assistance. Patient has had [facility] in the past. Ennis Forts with [facility] accepted referral.    Also discussed palliative to follow at home. James in agreement . Referral given to Lonia Chimera Abundio Miu)  Expected Discharge Plan: [place] Barriers to Discharge: Continued Medical Work up   Patient Goals and CMS Choice Patient states their goals for this hospitalization and ongoing recovery are:: to return to home CMS Medicare.gov Compare Post Acute Care list provided to:: Patient Choice offered to / list presented to : Patient (Friend)  Expected Discharge Plan and Services Expected Discharge Plan: [place]   Discharge Planning Services: CM Consult   Living arrangements for the past 2 months: Single Family Home                 DME Arranged: N/A         HH Arranged: PT, OT HH Agency: [facility] Date HH Agency Contacted: 11/17/21 Time HH Agency Contacted: 41 Representative spoke with at [place]: Ennis Forts  Prior Living Arrangements/Services Living arrangements for the past 2 months: [place] with:: Roommate              Current home services: DME    Activities of Daily Living Home Assistive Devices/Equipment: None ADL Screening (condition at time of admission) Patient's cognitive ability adequate to safely complete daily activities?: Yes Is the patient deaf or have difficulty hearing?: No Does the patient have difficulty  seeing, even when wearing glasses/contacts?: No Does the patient have difficulty concentrating, remembering, or making decisions?: Yes Patient able to express need for assistance with ADLs?: Yes Does the patient have difficulty dressing or bathing?: Yes Independently performs ADLs?: Yes (appropriate for developmental age) Does the patient have difficulty walking or climbing stairs?: Yes Weakness of Legs: Both Weakness of Arms/Hands: Both  Permission Sought/Granted                  Emotional Assessment              Admission diagnosis:  Acute on chronic combined systolic and diastolic CHF (congestive heart failure) ([place]) [I50.43] Patient Active Problem List   Diagnosis Date Noted   Acute metabolic encephalopathy 0000000   Acute on chronic combined systolic and diastolic CHF (congestive heart failure) ([place]) 11/13/2021   Atrial flutter, paroxysmal ([place]) 11/13/2021   Elevated LFTs 08/10/2021   Hyperkalemia 07/22/2021   HFrEF (heart failure with reduced ejection fraction) ([place])    Acute congestive heart failure ([place]) 07/18/2021   Cocaine abuse ([place]) 07/18/2021   Acute cardiogenic pulmonary edema ([place]) 07/18/2021   Acute on chronic respiratory failure with hypoxia ([place]) 07/18/2021   Bipolar disorder ([place]) 07/18/2021   Elevated d-dimer 07/18/2021   Essential hypertension 07/18/2021   Chronic kidney disease, stage 3a ([place]) 07/18/2021   PCP:  Merryl Hacker, No Pharmacy:   [place], [place] - [address] [id number]  [place] [id number] Phone: [phone] Fax: [phone]  [id number] [place], [place] [facility] [address] [place] [address] Phone: [phone] Fax: [phone]  [facility] [address] [address] Phone: [phone] Fax: [phone]     Social Determinants of Health (SDOH) Interventions    Readmission Risk Interventions     View : No data to  display.

## 2021-11-17 NOTE — Progress Notes (Signed)
Around 0900 patient woke up from a brief nap asking to "have a smoke of crack". This RN explained that patient was in the hospital and that she was receiving medical treatment. Patient then started screaming about how she needed to "sell my chickens so i have the money to get some 'sossa' when I get out of here". This RN asked patient to clarify was "sossa" is and was told it was crack. Patient reoriented to situation and was educated on importance of staying clean from drugs.  This RN was called into the room by physical therapist because two lighters fell out of the patients purse while patient was moving. This RN collected the items to give to security. 5  Minutes later, the same physical therapist came out of patient room to tell this RN that the patient also had a sandwich bag with marijuana in it. This RN collected the bag and called security as well as alerted charge nurse. Security came to floor and took all three items away to dispose of.

## 2021-11-17 NOTE — Progress Notes (Signed)
Heart Failure Stewardship Pharmacist Progress Note   PCP: Pcp, No PCP-Cardiologist: None    HPI:  71 yo F with PMH of CKD, cirrhosis of liver, COPD on chronic O2, bipolar, cocaine use, and systolic CHF who presented to [facility] ED 05/28 with dyspnea and LEE over past 3 days.    She was admitted 01/29 through 02/04 for an acute on chronic CHF exacerbation secondary to cocaine use.  Diuresed with discharge weighing 160.6 lbs.  Echo on 01/30 during that admission revealed newly reduced LVEF of 25-30% with G3DD.  Stress test on 02/02 was negative for inducible ischemia with evidence of global hypokinesis and marked LVE.  She was discharged on Entresto, Corlanor, and Lasix 40 mg daily; spironolactone was initiated during hospital stay but discontinued with hyperkalemia of 5.2 (Lokelma given).  Her PTA amlodipine and HCTZ were discontinued.  Followed up with [facility] 02/16 where she admitted to continued cocaine use and reported that she was still taking the HCTZ and amlodipine and was nonadherent to Corlanor.    At 03/02 cardiology follow-up visit, she was still using cocaine but denied dyspnea, orthopnea or LEE.  Spironolactone 12.5 mg daily was added.  Still no beta-blockers with persistent cocaine use.    Now, she is admitted for another CHF exacerbation secondary to cocaine use.  CXR in the ED showed mild diffuse interstitial prominence, indicative of either mild interstitial edema or sequela of CHF exacerbations.  Echo on 05/30 revealed severely reduced EF of <20%, severely reduced RV function with elevated pulmonary pressures, moderate-severe biatrial dilation, and moderate-severe TR - all new since Jan admission.  The G3DD and elevated LVEDP remains the same.    She remained volume overload with JVD and 2+ LEE evident on exam 05/30.  Carvedilol 3.125 mg BID was initiated and she received one dose before it was discontinued due to her severe biventricular failure and fluid still on board. It  has since been restarted.  Current HF Medications: Diuretic: furosemide IV 40 mg q12h Beta Blocker: carvedilol 3.125 mg BID Other: BiDil 1 tab BID  Prior to admission HF Medications: Not taking any PTA medications - most Rx's last filled in February. Entresto filled in March and April.  Prescribed the following -  Diuretic: Lasix 40 mg daily ACE/ARB/ARNI: Entresto 24-26 mg BID Aldosterone Antagonist: Spironolactone 12.5 mg daily  Other: Corlanor 5 mg BID  Pertinent Lab Values: Serum creatinine 1.43 (baseline ~1.2), BUN 18, Potassium 4.1, Sodium 142, BNP 1828, Magnesium 1.9, A1c 6%  Vital Signs: Weight: 187 lbs (admission weight: 188 lbs) Blood pressure: 100-120s/80-100s  Heart rate: 80-90 - HR occasionally dropping to 40-50s I/O: -4.2L yesterday; net -5.7L  Medication Assistance / Insurance Benefits Check: Does the patient have prescription insurance?  Yes Type of insurance plan: UHC Medicare / Medicaid [place]:  [facility] Is the patient willing to use [place] at discharge? Yes Is the patient willing to transition their outpatient pharmacy to utilize a [facility] outpatient pharmacy?   Pending    Assessment: 1. Acute on chronic systolic CHF (LVEF <56%), due to ongoing cocaine use. NYHA class II-III symptoms. Renal function improving slightly.  Still on 2L [place].  Weight up 2 lbs today - likely inaccurate given output yesterday.   - Furosemide IV 40 mg q12h - Recommend stopping beta-blocker with ongoing cocaine use, volume overload, and severe biventricular failure with EF <20% - Acute on chronic kidney injury limiting GDMT initiation - no  MRA / SGLT2i / RAAS agent - Consider adding SGLT2i if SCr improves to baseline of 1.2 (eGFR currently 39) - No digoxin with AKI - would consider for RV support if renal function normalizes - Hypokalemia resolved, consider adding KCl 40 mEq x 2 if continuing with current  dose or higher dose of IV lasix. Recommend magnesium 2 g IV x 1 - Continue BiDil 1 tab BID - consider increasing to TID    Plan: 1) Medication changes recommended at this time: - INCREASE Lasix to IV 40 mg TID for additional diuresis  - ADD KCl 40 mEq x 2 doses + Magnesium 2 g IV x 1 - STOP carvedilol - INCREASE BiDil to 1 tab TID - Daily BMP and Mg  - Recommend Lifevest before discharge with severely reduced LVEF  2) Patient assistance: - None - has Medicaid  3)  Education  - To be completed prior to discharge  Kerby Nora, PharmD, BCPS Heart Failure Stewardship Pharmacist Phone [phone]  Please check [email or web address] for unit-specific pharmacist phone numbers

## 2021-11-17 NOTE — Progress Notes (Signed)
Progress Note  Patient Name: Valerie Rodgers Date of Encounter: 11/17/2021  Attending physician: Tawni Millers Primary care provider: Pcp, No  Subjective: Valerie Rodgers is a 71 y.o. female who was seen and examined at bedside  Shortness of breath improving Lower extremity swelling improving Denies angina pectoris Case discussed and reviewed with her nurse.  Objective: Temp:  [97.5 F (36.4 C)-98.7 F (37.1 C)] 97.8 F (36.6 C) (06/01 2001) Pulse Rate:  [55-97] 83 (06/01 2001) Cardiac Rhythm: Atrial fibrillation (06/01 1900) Resp:  [20-24] 20 (06/01 2001) BP: (93-132)/(53-114) 93/53 (06/01 2001) SpO2:  [92 %-100 %] 93 % (06/01 2001) Weight:  [85 kg] 85 kg (06/01 0237)  Intake/Output:  Intake/Output Summary (Last 24 hours) at 11/17/2021 2221 Last data filed at 11/17/2021 2000 Gross per 24 hour  Intake 480 ml  Output 4300 ml  Net -3820 ml    Net IO Since Admission: -6,961.02 mL [11/17/21 2221]  Weights:  Filed Weights   11/15/21 0029 11/16/21 0439 11/17/21 0237  Weight: 84.6 kg 84.1 kg 85 kg    Telemetry: Personally reviewed.  Predominately atrial flutter, rate controlled  Physical examination: PHYSICAL EXAM:    11/17/2021    8:01 PM 11/17/2021   10:27 AM 11/17/2021    8:06 AM  Vitals with BMI  Systolic 93 [id number]  Diastolic 53 69 81  Pulse 83 97 55    CONSTITUTIONAL: Appears older than stated age, disheveled, mild respiratory distress, hemodynamically stable, resting in bed SKIN: Skin is warm and dry. No rash noted. No cyanosis. No pallor. No jaundice HEAD: Normocephalic and atraumatic.  EYES: No scleral icterus MOUTH/THROAT: Moist oral membranes.  NECK: JVD present. No thyromegaly noted. No carotid bruits  CHEST Normal respiratory effort. No intercostal retractions  LUNGS: Clear to auscultation bilaterally upper and midlung fields posteriorly.  No stridor.  Bibasilar expiratory wheezes and rales. CARDIOVASCULAR: Irregularly irregular, variable  [id number], holosystolic murmur at the left lower sternal border, no gallop.   ABDOMINAL: Soft, nontender, nondistended, positive bowel sounds in all 4 quadrants, no apparent ascites.  EXTREMITIES: Bilateral 2+ pitting edema mid shin, warm to touch HEMATOLOGIC: No significant bruising NEUROLOGIC: Oriented to person, place, and time. Nonfocal. Normal muscle tone.  PSYCHIATRIC: Normal mood and affect. Normal behavior. Cooperative  Lab Results: Hematology Recent Labs  Lab 11/15/21 [phone] 11/16/21 0341 11/17/21 0355  WBC 6.2 5.7 5.7  RBC 4.87 4.50 4.82  HGB 13.6 12.5 13.4  HCT 42.6 38.5 40.3  MCV 87.5 85.6 83.6  MCH 27.9 27.8 27.8  MCHC 31.9 32.5 33.3  RDW 15.9* 15.8* 15.7*  PLT 169 170 196    Chemistry Recent Labs  Lab 11/13/21 1843 11/14/21 0140 11/15/21 0312 11/16/21 0341 11/16/21 1143 11/17/21 0355  NA 144 143 139 140 143 142  K 4.0 4.3 3.7 3.2* 3.7 4.1  CL 114* 111 105 106 106 105  CO2 23 20* 24 30 31 30   GLUCOSE 103* 138* 127* 94 93 96  BUN 22 21 22 21 19 18   CREATININE 1.63* 1.63* 1.61* 1.50* 1.41* 1.43*  CALCIUM 8.5* 8.6* 8.5* 8.3* 8.4* 8.7*  PROT 5.5* 5.7* 5.4*  --   --   --   ALBUMIN 3.0* 3.1* 2.9*  --   --   --   AST 41 45* 34  --   --   --   ALT 49* 51* 48*  --   --   --   ALKPHOS 80 80 71  --   --   --  BILITOT 0.8 0.8 0.5  --   --   --   GFRNONAA 33* 33* 34* 37* 40* 39*  ANIONGAP 7 12 10  4* 6 7     Cardiac Enzymes: Cardiac Panel (last 3 results) No results for input(s): CKTOTAL, CKMB, TROPONINIHS, RELINDX in the last 72 hours.   BNP (last 3 results) Recent Labs    07/17/21 2206 07/20/21 0542 11/13/21 1843  BNP 2,729.9* 509.0* 1,828.7*    ProBNP (last 3 results) No results for input(s): PROBNP in the last 8760 hours.   DDimer No results for input(s): DDIMER in the last 168 hours.   Hemoglobin A1c:  Lab Results  Component Value Date   HGBA1C 6.0 (H) 11/16/2021   MPG 125.5 11/16/2021    TSH  Recent Labs    11/13/21 1843  TSH 3.570     Lipid Panel     Component Value Date/Time   CHOL 171 05/24/2009 2044   TRIG 92 05/24/2009 2044   HDL 56 05/24/2009 2044   CHOLHDL 3.1 Ratio 05/24/2009 2044   VLDL 18 05/24/2009 2044   LDLCALC 97 05/24/2009 2044   Estimated Creatinine Clearance: 37.5 mL/min (A) (by C-G formula based on SCr of 1.43 mg/dL (H)).  Imaging: No results found.   Ultrasound abdomen 07/18/2021: 1. Subtle nodularity of the liver contour suggesting cirrhosis. No hepatic mass identified. 2. 3 mm echogenic focus in the gallbladder which may represent a polyp   CARDIAC DATABASE: EKG:  EKG 11/14/2021: Atypical atrial flutter with variable AV conduction.  Normal axis.  Poor R wave progression, cannot exclude anteroseptal infarct old.  Nonspecific abnormality.  Compared to 08/04/2021, wandering atrial pacemaker is now replaced by flutter.   Nuclear stress test 07/21/2021: 1. Negative for ischemia. Fixed small anterior and moderate inferolateral defects: attenuation versus scar. 2. Global hypokinesis with marked LVE. 3. Left ventricular ejection fraction 21% 4. Non invasive risk stratification*: High    Echocardiogram 11/15/2021:   1. Left ventricular ejection fraction, by estimation, is <20%. The left ventricle has severely decreased function. The left ventricle demonstrates global hypokinesis. The left ventricular internal cavity size was mildly dilated. Left ventricular  diastolic parameters are consistent with Grade III diastolic dysfunction (restrictive). Elevated left ventricular end-diastolic pressure.  2. Right ventricular systolic function is severely reduced. The right ventricular size is moderately enlarged. There is moderately elevated pulmonary artery systolic pressure. The estimated right ventricular systolic pressure is 47.9 mmHg.  3. Left atrial size was severely dilated.  4. Right atrial size was moderately dilated.  5. Annular dilatation. The mitral valve is normal in structure. Moderate mitral  valve regurgitation.  6. Annular dilatation. Tricuspid valve regurgitation is moderate to severe.  7. The aortic valve is tricuspid. Aortic valve regurgitation is trivial. No aortic stenosis is present.  8. The inferior vena cava is dilated in size with <50% respiratory variability, suggesting right atrial pressure of 15 mmHg.   Comparison(s): Compared to the study done on 07/18/2021, LVEF has further reduced from 25 to 30% to the present 10 to 15%. Right ventricular function was previously normal and the size was normal.   Conclusion(s)/Recommendation(s): Findings consistent with non-ischemic cardiomyopathy.     Conclusion(s)/Recommendation(s): Findings consistent with non-ischemic cardiomyopathy.  Scheduled Meds:  amiodarone  200 mg Oral BID   fluticasone furoate-vilanterol  1 puff Inhalation Daily   furosemide  40 mg Intravenous Q8H   isosorbide-hydrALAZINE  1 tablet Oral BID   ivabradine  5 mg Oral BID WC   nicotine  21 mg  Transdermal Daily   rivaroxaban  15 mg Oral Q supper   thiamine injection  100 mg Intravenous Daily    Continuous Infusions:   PRN Meds: acetaminophen **OR** acetaminophen   IMPRESSION & RECOMMENDATIONS: TOLANDA MIELCAREK is a 71 y.o. African-American female whose past medical history and cardiac risk factors include: Presumed nonischemic cardiomyopathy, hypertension, COPD, bipolar disorder, recurrent cocaine abuse, noncompliance, combined systolic and diastolic heart failure, advanced age, postmenopausal female.  Impression: Atrial flutter with variable conduction, rate improving. Acute biventricular heart failure Recurrent cocaine use Elevated troponins likely secondary to supply demand ischemia in the setting of cardiomyopathy, atrial flutter, cocaine use, and heart failure exacerbation COPD on home O2. Acute on chronic kidney disease stage IIIb. Noncompliance  Plan: Atypical atrial flutter: Rate control: NA. Rhythm control:  Amiodarone. Thromboembolic prophylaxis: Xarelto Telemetry notes underlying rhythm to be atrial flutter with variable conduction, improved ventricular rate. Increase amiodarone to 200 mg p.o. bid. With regards to rate control strategy not a candidate for calcium channel blockers given her reduced LVEF or selective beta-blockers given her cocaine use.  We will restart carvedilol after the acute exacerbation of CHF is improved.  Start ivabradine 5 mg p.o. twice daily.  CHA2DS2-VASc SCORE is 4 which correlates to 4.8% risk of stroke per year (CHF, HTN, Female, Age).  Patient has not decided with regards to oral anticoagulation as outpatient.  I have extensively expressed my concern that given her noncompliance and recurrent cocaine use she is at increased risk of bleeding; therefore, risks may be outweighing the benefits.  We will await her decision. For now she is on Xarelto while inpatient. Per nursing Staff updates me that when she wakes up abruptly from a nap she is asking to do crack and cocaine.  She is unstable on her feet which predisposes her to falls as well.  Asked nursing staff to document these events if they are care. Monitor for now.  Acute biventricular heart failure:  Stage C, NYHA Class III.  Net IO Since Admission: -6,961.02 mL [11/17/21 2221] Increase Lasix to 40 mg p.o. 3 times daily Continue BiDil. Start ivabradine. TED Hose  Recommend daily weight check, strict I/O's Will follow with you.  Check BMP in the morning.  Acute Kidney Injury on CKD Stage IIIb:  Improving.  Monitor BUN and Cr.   Cocaine Use: Educated her on the importance of complete cessation.   Total time spent 35 minutes.  Patient's questions and concerns were addressed to her satisfaction. She voices understanding of the instructions provided during this encounter.   This note was created using a voice recognition software as a result there may be grammatical errors inadvertently enclosed that do not  reflect the nature of this encounter. Every attempt is made to correct such errors.  Mechele Claude [facility]  Pager: [phone] Office: [phone] 11/17/2021, 10:21 PM

## 2021-11-18 ENCOUNTER — Other Ambulatory Visit (HOSPITAL_COMMUNITY): Payer: Self-pay

## 2021-11-18 LAB — LIPID PANEL
Cholesterol: 109 mg/dL (ref 0–200)
HDL: 44 mg/dL (ref >40)
LDL Cholesterol: 55 mg/dL (ref 0–99)
Total CHOL/HDL Ratio: 2.5 RATIO
Triglycerides: 49 mg/dL (ref <150)
VLDL: 10 mg/dL (ref 0–40)

## 2021-11-18 LAB — BASIC METABOLIC PANEL
Anion gap: 8 (ref 5–15)
BUN: 15 mg/dL (ref 8–23)
CO2: 36 mmol/L — ABNORMAL HIGH (ref 22–32)
Calcium: 8.6 mg/dL — ABNORMAL LOW (ref 8.9–10.3)
Chloride: 98 mmol/L (ref 98–111)
Creatinine, Ser: 1.33 mg/dL — ABNORMAL HIGH (ref 0.44–1.00)
GFR, Estimated: 43 mL/min — ABNORMAL LOW (ref >60)
Glucose, Bld: 94 mg/dL (ref 70–99)
Potassium: 3.4 mmol/L — ABNORMAL LOW (ref 3.5–5.1)
Sodium: 142 mmol/L (ref 135–145)

## 2021-11-18 LAB — MAGNESIUM: Magnesium: 2 mg/dL (ref 1.7–2.4)

## 2021-11-18 LAB — LDL CHOLESTEROL, DIRECT: Direct LDL: 47.3 mg/dL (ref 0–99)

## 2021-11-18 LAB — BRAIN NATRIURETIC PEPTIDE: B Natriuretic Peptide: 958.7 pg/mL — ABNORMAL HIGH (ref 0.0–100.0)

## 2021-11-18 MED ORDER — DAPAGLIFLOZIN PROPANEDIOL 10 MG PO TABS
10.0000 mg | ORAL_TABLET | Freq: Every day | ORAL | 0 refills | Status: AC
  Filled 2021-11-18: qty 30, 30d supply, fill #0

## 2021-11-18 MED ORDER — CARVEDILOL 3.125 MG PO TABS: 3.1250 mg | ORAL_TABLET | Freq: Two times a day (BID) | ORAL | Status: DC

## 2021-11-18 MED ORDER — ATORVASTATIN CALCIUM 20 MG PO TABS
20.0000 mg | ORAL_TABLET | Freq: Every day | ORAL | 0 refills | Status: AC
  Filled 2021-11-18: qty 30, 30d supply, fill #0

## 2021-11-18 MED ORDER — FUROSEMIDE 40 MG PO TABS
40.0000 mg | ORAL_TABLET | Freq: Every day | ORAL | Status: DC
  Administered 2021-11-18: 40 mg via ORAL
  Filled 2021-11-18: qty 1

## 2021-11-18 MED ORDER — ISOSORB DINITRATE-HYDRALAZINE 20-37.5 MG PO TABS
1.0000 | ORAL_TABLET | Freq: Two times a day (BID) | ORAL | 0 refills | Status: AC
  Filled 2021-11-18: qty 60, 30d supply, fill #0

## 2021-11-18 MED ORDER — POTASSIUM CHLORIDE CRYS ER 20 MEQ PO TBCR: 40.0000 meq | EXTENDED_RELEASE_TABLET | Freq: Once | ORAL | Status: DC

## 2021-11-18 MED ORDER — POTASSIUM CHLORIDE CRYS ER 20 MEQ PO TBCR
40.0000 meq | EXTENDED_RELEASE_TABLET | ORAL | Status: AC
  Administered 2021-11-18 (×2): 40 meq via ORAL
  Filled 2021-11-18 (×2): qty 2

## 2021-11-18 MED ORDER — DAPAGLIFLOZIN PROPANEDIOL 10 MG PO TABS
10.0000 mg | ORAL_TABLET | Freq: Every day | ORAL | Status: DC
Start: 2021-11-18 — End: 2021-11-18
  Administered 2021-11-18: 10 mg via ORAL
  Filled 2021-11-18: qty 1

## 2021-11-18 MED ORDER — IVABRADINE HCL 5 MG PO TABS
5.0000 mg | ORAL_TABLET | Freq: Two times a day (BID) | ORAL | 0 refills | Status: AC
  Filled 2021-11-18: qty 60, 30d supply, fill #0

## 2021-11-18 MED ORDER — CARVEDILOL 3.125 MG PO TABS
3.1250 mg | ORAL_TABLET | Freq: Two times a day (BID) | ORAL | 0 refills | Status: DC
  Filled 2021-11-18: qty 60, 30d supply, fill #0

## 2021-11-18 NOTE — TOC Progression Note (Signed)
Transition of Care [facility]) - Progression Note    Patient Details  Name: DONIS KOTOWSKI MRN: [id number] Date of Birth: September 16, 1950  [facility] of [facility]) CM/SW Contact  Erin Sons, [place] Phone Number: 11/18/2021, 4:11 PM  Clinical Narrative:     CSW notified by RN that pt is Dcing. She is unable to arrange a ride with family/friends and pt unsafe to use bus. CSW provided taxi voucher for transport to her home address listed in chart.   Expected Discharge Plan: Home w OP PT Barriers to Discharge: Continued Medical Work up  Expected Discharge Plan and Services Expected Discharge Plan: Home w OP PT   Discharge Planning Services: CM Consult   Living arrangements for the past 2 months: Single Family Home Expected Discharge Date: 11/18/21               DME Arranged: N/A         HH Arranged: PT, OT HH Agency: [facility] Date HH Agency Contacted: 11/17/21 Time HH Agency Contacted: 1240 Representative spoke with at [facility] Agency: Maia Petties rescinded offer. Pt set up with OP PT   Social Determinants of Health (SDOH) Interventions    Readmission Risk Interventions     View : No data to display.

## 2021-11-18 NOTE — Progress Notes (Signed)
Occupational Therapy Treatment Patient Details Name: Valerie Rodgers MRN: [id number] DOB: 1951-03-25 Today's Date: 11/18/2021   History of present illness The pt is a 71 yo female presenting 5/28 with SOB, LE swelling, and heart palpitations. Of note, pt reports cocaine use just prior to calling EMS. Pt found to have CHF exacerbation and in afib. PMH includes: CHF, cocaine abuse, bipolar, CKD, cirrhosis of liver, COPD on chronic 2L O2.   OT comments  Patient received in supine and agreeable to OT treatment. Patient able to get to EOB and donned socks without assistance. Patient was min assist for transfer to [facility] and clothing management and was able to perform toilet hygiene. Patient ambulated short distance to sink and performed grooming seated at sink. Patient performed static stand from recliner with HR increasing from 98 to 133. Patient discharge recommendations changed from SNF to outpatient rehab due to increased functional status. Patient to continue to be followed by acute OT.    Recommendations for follow up therapy are one component of a multi-disciplinary discharge planning process, led by the attending physician.  Recommendations may be updated based on patient status, additional functional criteria and insurance authorization.    Follow Up Recommendations  Outpatient OT    Assistance Recommended at Discharge Frequent or constant Supervision/Assistance  Patient can return home with the following  A little help with walking and/or transfers;A little help with bathing/dressing/bathroom;Assistance with cooking/housework;Help with stairs or ramp for entrance   Equipment Recommendations  Other (comment) (continue to assess)    Recommendations for Other Services      Precautions / Restrictions Precautions Precautions: Fall;Other (comment) Precaution Comments: watch O2, supposed to use O2 at baseline, pt states she has it but does not use because she doesn't know how, watch  HR Restrictions Weight Bearing Restrictions: No       Mobility Bed Mobility Overal bed mobility: Needs Assistance Bed Mobility: Supine to Sit     Supine to sit: Min guard     General bed mobility comments: minG with increased time    Transfers Overall transfer level: Needs assistance Equipment used: Rolling walker (2 wheels) Transfers: Sit to/from Stand, Bed to chair/wheelchair/BSC Sit to Stand: Min assist           General transfer comment: min assist to transfer to [facility] from EOB and ambualted short distance to chair at sink for grooming     Balance Overall balance assessment: Needs assistance Sitting-balance support: Feet supported, No upper extremity supported Sitting balance-Leahy Scale: Good     Standing balance support: During functional activity, Reliant on assistive device for balance Standing balance-Leahy Scale: Poor Standing balance comment: dependent on BUE support                           ADL either performed or assessed with clinical judgement   ADL Overall ADL's : Needs assistance/impaired     Grooming: Wash/dry hands;Wash/dry face;Oral care;Supervision/safety;Sitting Grooming Details (indicate cue type and reason): performed seated due to HR raised to 133 when standing             Lower Body Dressing: Supervision/safety;Sitting/lateral leans Lower Body Dressing Details (indicate cue type and reason): donned socks seated on EOB Toilet Transfer: Minimal assistance;BSC/3in1 Toilet Transfer Details (indicate cue type and reason): transferred to bSC from EOB Toileting- Clothing Manipulation and Hygiene: Minimal assistance Toileting - Clothing Manipulation Details (indicate cue type and reason): assistance with clothing management and patient able to perform toilet  hygiene seated       General ADL Comments: demonstrated progress with LB dressing and toileting    Extremity/Trunk Assessment              Vision        Perception     Praxis      Cognition Arousal/Alertness: Awake/alert Behavior During Therapy: WFL for tasks assessed/performed, Flat affect Overall Cognitive Status: Impaired/Different from baseline Area of Impairment: Orientation, Safety/judgement, Memory, Awareness, Attention                 Orientation Level: Disoriented to, Time, Situation Current Attention Level: Sustained Memory: Decreased short-term memory   Safety/Judgement: Decreased awareness of safety, Decreased awareness of deficits Awareness: Intellectual   General Comments: talkative, asking when she will be going home        Exercises      Shoulder Instructions       General Comments resting HR 98-100 stood for 30 seconds and increased to 133    Pertinent Vitals/ Pain       Pain Assessment Pain Assessment: Faces Faces Pain Scale: Hurts a little bit Pain Location: LE stiffness from being in chair Pain Descriptors / Indicators: Discomfort Pain Intervention(s): Monitored during session, Repositioned  Home Living                                          Prior Functioning/Environment              Frequency  Min 2X/week        Progress Toward Goals  OT Goals(current goals can now be found in the care plan section)  Progress towards OT goals: Progressing toward goals  Acute Rehab OT Goals Patient Stated Goal: go home OT Goal Formulation: With patient Time For Goal Achievement: 11/30/21 Potential to Achieve Goals: Fair ADL Goals Pt Will Perform Grooming: with modified independence;sitting Pt Will Perform Lower Body Dressing: sitting/lateral leans;sit to/from stand;with modified independence Pt Will Transfer to Toilet: with modified independence;ambulating;regular height toilet Pt Will Perform Toileting - Clothing Manipulation and hygiene: with modified independence;sitting/lateral leans;sit to/from stand Additional ADL Goal #1: Pt will be Mod I bed mobility in  preparation for increased participation with ADL's Additional ADL Goal #2: Pt will be Min A tub transfers with caregiver independent, using RW and 3:1 or chair  Plan Discharge plan remains appropriate    Co-evaluation                 AM-PAC OT "6 Clicks" Daily Activity     Outcome Measure   Help from another person eating meals?: None Help from another person taking care of personal grooming?: A Little Help from another person toileting, which includes using toliet, bedpan, or urinal?: A Little Help from another person bathing (including washing, rinsing, drying)?: A Little Help from another person to put on and taking off regular upper body clothing?: A Little Help from another person to put on and taking off regular lower body clothing?: A Little 6 Click Score: 19    End of Session Equipment Utilized During Treatment: Gait belt;Rolling walker (2 wheels);Oxygen  OT Visit Diagnosis: Muscle weakness (generalized) (M62.81);Unsteadiness on feet (R26.81)   Activity Tolerance Patient tolerated treatment well   Patient Left in chair;with call bell/phone within reach;with chair alarm set   Nurse Communication Mobility status        Time: 2423-5361 OT Time  Calculation (min): 34 min  Charges: OT General Charges $OT Visit: 1 Visit OT Treatments $Self Care/Home Management : 23-37 mins  Alfonse Flavors, OTA Acute Rehabilitation Services  Pager [phone] Office [phone]   Dewain Penning 11/18/2021, 2:05 PM

## 2021-11-18 NOTE — TOC Progression Note (Addendum)
Transition of Care [facility]) - Progression Note    Patient Details  Name: Valerie Rodgers MRN: [id number] Date of Birth: [birth year]  [facility] CM/SW Contact  Lequita Meadowcroft, Adria Devon, RN Phone Number: 11/18/2021, 9:48 AM  Clinical Narrative:     Rebecca Eaton with [facility]  has rescinded their acceptance of [facility] referral , due to patient having  sandwich bag with marijuana in it due to safety reasons.      NCM unable to establish home health services due to above   [employer] with [facility] awaiting response . [facility] can still see for OP palliative   Discussed OP PT with patient. Patient in agreement . Discussed locations patient in agreement for [facility] location. Order entered for MD signature and information placed on AVS   Expected Discharge Plan: Home w Home Health Services Barriers to Discharge: Continued Medical Work up  Expected Discharge Plan and Services Expected Discharge Plan: Home w Home Health Services   Discharge Planning Services: CM Consult   Living arrangements for the past 2 months: Single Family Home                 DME Arranged: N/A         HH Arranged: PT, OT HH Agency: [facility] Date HH Agency Contacted: 11/17/21 Time HH Agency Contacted: 1240 Representative spoke with at [facility] Agency: Rebecca Eaton   Social Determinants of Health (SDOH) Interventions    Readmission Risk Interventions     View : No data to display.

## 2021-11-18 NOTE — Progress Notes (Signed)
Progress Note  Patient Name: Valerie Rodgers Date of Encounter: 11/18/2021  Attending physician: No att. providers found Primary care provider: Pcp, No  Subjective: Valerie Rodgers is a 71 y.o. female who was seen and examined at bedside  Denies angina pectoris. Continues to diurese well -almost back to baseline. Remains in atrial flutter. Security was called yesterday as she was found to have marijuana and monitors in position -see nursing note. Case discussed and reviewed with her nurse.  Objective: Temp:  [97.4 F (36.3 C)-98.2 F (36.8 C)] 97.4 F (36.3 C) (06/02 0727) Pulse Rate:  [38-91] 89 (06/02 0504) Cardiac Rhythm: Atrial fibrillation (06/02 0700) Resp:  [16-20] 16 (06/02 0727) BP: (86-104)/(53-73) 104/73 (06/02 0729) SpO2:  [93 %-100 %] 96 % (06/02 0727) FiO2 (%):  [100 %] 100 % (06/02 0711) Weight:  [77.6 kg] 77.6 kg (06/02 0504)  Intake/Output:  Intake/Output Summary (Last 24 hours) at 11/18/2021 1634 Last data filed at 11/18/2021 1447 Gross per 24 hour  Intake 800 ml  Output 4350 ml  Net -3550 ml    Net IO Since Admission: [id number] mL [11/18/21 1634]  Weights:  Filed Weights   11/17/21 0237 11/18/21 0108 11/18/21 0504  Weight: 85 kg 77.6 kg 77.6 kg    Telemetry: Personally reviewed.  Predominately atrial flutter variable conduction, rate controlled  Physical examination: PHYSICAL EXAM:    11/18/2021    7:29 AM 11/18/2021    7:27 AM 11/18/2021    5:04 AM  Vitals with BMI  Weight   171 lbs 1 oz  BMI   [id number]  Systolic [id number] 86 94  Diastolic 73 67 69  Pulse   89    CONSTITUTIONAL: Appears older than stated age, disheveled, mild respiratory distress, hemodynamically stable, resting in bed SKIN: Skin is warm and dry. No rash noted. No cyanosis. No pallor. No jaundice HEAD: Normocephalic and atraumatic.  EYES: No scleral icterus MOUTH/THROAT: Moist oral membranes.  NECK: JVD present. No thyromegaly noted. No carotid bruits  CHEST Normal respiratory  effort. No intercostal retractions  LUNGS: Clear to auscultation bilaterally upper and midlung fields posteriorly.  No stridor.  Bibasilar expiratory wheezes and rales. CARDIOVASCULAR: Irregularly irregular, variable [id number], holosystolic murmur at the left lower sternal border, no gallop.   ABDOMINAL: Soft, nontender, nondistended, positive bowel sounds in all 4 quadrants, no apparent ascites.  EXTREMITIES: Trace trace bilateral pitting edema mid shin, warm to touch HEMATOLOGIC: No significant bruising NEUROLOGIC: Oriented to person, place, and time. Nonfocal. Normal muscle tone.  PSYCHIATRIC: Normal mood and affect. Normal behavior. Cooperative  Lab Results: Hematology Recent Labs  Lab 11/15/21 [phone] 11/16/21 0341 11/17/21 0355  WBC 6.2 5.7 5.7  RBC 4.87 4.50 4.82  HGB 13.6 12.5 13.4  HCT 42.6 38.5 40.3  MCV 87.5 85.6 83.6  MCH 27.9 27.8 27.8  MCHC 31.9 32.5 33.3  RDW 15.9* 15.8* 15.7*  PLT 169 170 196    Chemistry Recent Labs  Lab 11/13/21 1843 11/14/21 0140 11/15/21 0312 11/16/21 0341 11/16/21 1143 11/17/21 0355 11/18/21 0417  NA 144 143 139   < > 143 142 142  K 4.0 4.3 3.7   < > 3.7 4.1 3.4*  CL 114* 111 105   < > 106 105 98  CO2 23 20* 24   < > 31 30 36*  GLUCOSE 103* 138* 127*   < > 93 96 94  BUN 22 21 22    < > 19 18 15   CREATININE 1.63* 1.63* 1.61*   < >  1.41* 1.43* 1.33*  CALCIUM 8.5* 8.6* 8.5*   < > 8.4* 8.7* 8.6*  PROT 5.5* 5.7* 5.4*  --   --   --   --   ALBUMIN 3.0* 3.1* 2.9*  --   --   --   --   AST 41 45* 34  --   --   --   --   ALT 49* 51* 48*  --   --   --   --   ALKPHOS 80 80 71  --   --   --   --   BILITOT 0.8 0.8 0.5  --   --   --   --   GFRNONAA 33* 33* 34*   < > 40* 39* 43*  ANIONGAP 7 12 10    < > 6 7 8    < > = values in this interval not displayed.     Cardiac Enzymes: Cardiac Panel (last 3 results) No results for input(s): CKTOTAL, CKMB, TROPONINIHS, RELINDX in the last 72 hours.   BNP (last 3 results) Recent Labs    07/20/21 0542  11/13/21 1843 11/18/21 0417  BNP 509.0* 1,828.7* 958.7*    ProBNP (last 3 results) No results for input(s): PROBNP in the last 8760 hours.   DDimer No results for input(s): DDIMER in the last 168 hours.   Hemoglobin A1c:  Lab Results  Component Value Date   HGBA1C 6.0 (H) 11/16/2021   MPG 125.5 11/16/2021    TSH  Recent Labs    11/13/21 1843  TSH 3.570    Lipid Panel     Component Value Date/Time   CHOL 109 11/18/2021 0417   TRIG 49 11/18/2021 0417   HDL 44 11/18/2021 0417   CHOLHDL 2.5 11/18/2021 0417   VLDL 10 11/18/2021 0417   LDLCALC 55 11/18/2021 0417   LDLDIRECT 47.3 11/18/2021 0417   Estimated Creatinine Clearance: 38.6 mL/min (A) (by C-G formula based on SCr of 1.33 mg/dL (H)).  Imaging: No results found.   Ultrasound abdomen 07/18/2021: 1. Subtle nodularity of the liver contour suggesting cirrhosis. No hepatic mass identified. 2. 3 mm echogenic focus in the gallbladder which may represent a polyp   CARDIAC DATABASE: EKG:  EKG 11/14/2021: Atypical atrial flutter with variable AV conduction.  Normal axis.  Poor R wave progression, cannot exclude anteroseptal infarct old.  Nonspecific abnormality.  Compared to 08/04/2021, wandering atrial pacemaker is now replaced by flutter.   Nuclear stress test 07/21/2021: 1. Negative for ischemia. Fixed small anterior and moderate inferolateral defects: attenuation versus scar. 2. Global hypokinesis with marked LVE. 3. Left ventricular ejection fraction 21% 4. Non invasive risk stratification*: High    Echocardiogram 11/15/2021:   1. Left ventricular ejection fraction, by estimation, is <20%. The left ventricle has severely decreased function. The left ventricle demonstrates global hypokinesis. The left ventricular internal cavity size was mildly dilated. Left ventricular  diastolic parameters are consistent with Grade III diastolic dysfunction (restrictive). Elevated left ventricular end-diastolic pressure.  2. Right  ventricular systolic function is severely reduced. The right ventricular size is moderately enlarged. There is moderately elevated pulmonary artery systolic pressure. The estimated right ventricular systolic pressure is [id number] mmHg.  3. Left atrial size was severely dilated.  4. Right atrial size was moderately dilated.  5. Annular dilatation. The mitral valve is normal in structure. Moderate mitral valve regurgitation.  6. Annular dilatation. Tricuspid valve regurgitation is moderate to severe.  7. The aortic valve is tricuspid. Aortic valve regurgitation is trivial. No  aortic stenosis is present.  8. The inferior vena cava is dilated in size with <50% respiratory variability, suggesting right atrial pressure of 15 mmHg.   Comparison(s): Compared to the study done on 07/18/2021, LVEF has further reduced from 25 to 30% to the present 10 to 15%. Right ventricular function was previously normal and the size was normal.   Conclusion(s)/Recommendation(s): Findings consistent with non-ischemic cardiomyopathy.     Conclusion(s)/Recommendation(s): Findings consistent with non-ischemic cardiomyopathy.  Scheduled Meds:  atorvastatin  20 mg Oral QHS   carvedilol  3.125 mg Oral BID WC   dapagliflozin propanediol  10 mg Oral Daily   fluticasone furoate-vilanterol  1 puff Inhalation Daily   furosemide  40 mg Oral Daily   isosorbide-hydrALAZINE  1 tablet Oral BID   ivabradine  5 mg Oral BID WC   nicotine  21 mg Transdermal Daily   thiamine injection  100 mg Intravenous Daily    Continuous Infusions:   PRN Meds: acetaminophen **OR** acetaminophen   IMPRESSION & RECOMMENDATIONS: Valerie Rodgers is a 71 y.o. African-American female whose past medical history and cardiac risk factors include: Presumed nonischemic cardiomyopathy, hypertension, COPD, bipolar disorder, recurrent cocaine abuse, noncompliance, combined systolic and diastolic heart failure, advanced age, postmenopausal  female.  Impression: Atrial flutter with variable conduction, rate improving. Acute biventricular heart failure Recurrent cocaine use Elevated troponins likely secondary to supply demand ischemia in the setting of cardiomyopathy, atrial flutter, cocaine use, and heart failure exacerbation COPD on home O2. Acute on chronic kidney disease stage IIIb. Noncompliance  Plan: Atypical atrial flutter: Rate control: Coreg. Rhythm control: N/A. Thromboembolic prophylaxis: N/A Telemetry notes underlying rhythm to be atrial flutter with variable conduction, improved ventricular rate. Start Coreg 3.125 mg p.o. twice daily CHA2DS2-VASc SCORE is 4 which correlates to 4.8% risk of stroke per year (CHF, HTN, Female, Age).  Patient states that she has decided not to be on oral anticoagulation given the risks of bleeding.  She understands risk of stroke. Monitor for now.  Acute biventricular heart failure:  Stage C, NYHA Class II/III.  Net IO Since Admission: -[id number] mL [11/18/21 1634] Change IV Lasix to p.o. Start Grubbs. Continue BiDil. Continue ivabradine. Start low-dose carvedilol 3.125 mg p.o. twice daily BNP improving TED Hose  Recommend daily weight check, strict I/O's  Acute Kidney Injury on CKD Stage IIIb:  Improving.  Monitor BUN and Cr.   Cocaine Use: Educated her on the importance of complete cessation.   Total time spent 35 minutes.  Patient's questions and concerns were addressed to her satisfaction. She voices understanding of the instructions provided during this encounter.   This note was created using a voice recognition software as a result there may be grammatical errors inadvertently enclosed that do not reflect the nature of this encounter. Every attempt is made to correct such errors.  Mechele Claude [facility]  Pager: [phone] Office: [phone] 11/18/2021, 4:34 PM

## 2021-11-18 NOTE — Discharge Summary (Addendum)
Physician Discharge Summary   Patient: Valerie Rodgers MRN: [id number] DOB: [birth year]  Admit date:     11/13/2021  Discharge date: 11/18/21  Discharge Physician: Jimmy Picket Ashaun Gaughan   PCP: Pcp, No   Recommendations at discharge:    Plan to continue diuresis with torsemide Continue heart failure management with, dapagliflozin, carvedilol and Bidil. Follow up renal function as outpatient Patient has declined oral anticoagulation for atrial fibrillation. Considering her fall risk related to alcohol and cocaine use, she has a elevated risk of bleeding.   Discharge Diagnoses: Principal Problem:   Acute on chronic combined systolic and diastolic CHF (congestive heart failure) (HCC) Active Problems:   Acute on chronic respiratory failure with hypoxia (HCC)   Chronic kidney disease, stage 3a (HCC)   Acute metabolic encephalopathy   Atrial flutter, paroxysmal (HCC)   Bipolar disorder (HCC)   Cocaine abuse ([place])  Resolved Problems:   * No resolved hospital problems. [facility] Course: Valerie Rodgers was admitted to the hospital with the working diagnosis of decompensated heart failure.   71 yo female with the past medical history of heart failure, cocaine use, alcohol abuse and atrial flutter who presented with dyspnea and edema. Reported worsening symptoms for the last 3 days, with palpitations, lower extremity edema and dyspnea. Positive cocaine use. On her initial physical examination her blood pressure was 109/83, HR 87, RR 23 and 02 saturation 100%, lungs with no wheezing or rhonchi, heart with S1 and S2 present and rhythmic, abdomen not distended, positive lower extremity edema.   Na 143, K 4,3 CL 111, bicarbonate 20, glucose 138, bun 21 cr 1,63 High sensitive troponin 89 and 116  Wbc 6,1 hgb 14,5 plt 151  UA SG 1,006,  Toxicology screen positive for cocaine.   Chest radiograph with cardiomegaly, positive hilar vascular congestion.   EKG 80 bpm, normal axis, normal qtc, atrial  flutter rhythm, with no significant ST segment or T wave changes.   Patient was placed on furosemide for diuresis.  Heparin drip for anticoagulation and rate control with amiodarone.   Echocardiogram with significant reduction in systolic function.  Her volume status improved and was placed on guideline directed therapy for heart failure.   Patient declined oral anticoagulation for atrial fibrillation, considering her poor compliance, cocaine and alcohol abuse, she has an elevated risk of falls and bleeding.  Discontinue anticoagulation and follow up as outpatient.   Assessment and Plan: * Acute on chronic combined systolic and diastolic CHF (congestive heart failure) ([place]) Patient was admitted to the progressive care unit, she was placed on IV furosemide for aggressive diuresis, negative fluid balance was achieved, -8,981 ml, with significant improvement in her symptoms.   Echocardiogram with reduced LV systolic function with EF <20%, with global hypokinesis, mild LV enlarged cavity, severe reduction in RV systolic function, moderate enlargement in RV cavity, RVSP 47,9, severe LA dilatation. Moderate mitral regurgitation. Moderate to severe TR.   Elevated troponin due to heart failure exacerbation.   Patient will continue afterload reduction with Bidil and B blockade with carvedilol.  Continue with Ivabradine  Continue diuresis with torsemide.   Acute on chronic respiratory failure with hypoxia (HCC) Pulmonary edema has improved, at the time of her discharge her oxygenation is 96% on 2 L/min per [place]. Plan to continue supplemental 02 per [place] at home.     Chronic kidney disease, stage 3a (HCC) Hypokalemia, hypomagnesemia   Renal function with serum cr at 1,33, K is 3,4 and serum bicarbonate at 36.  Plan to give 40 meq Kcl x2 po before discharge and follow up renal function as outpatient.    Acute metabolic encephalopathy Patient is awake and alert. No confusion or agitation.   Encephalopathy has resolved.   Atrial flutter, paroxysmal ([place]) Patient has been explained in detail anticoagulation for atrial fibrillation, she has refused oral anticoagulation.  Considering her cocaine and alcohol abuse she has a increased risk of falls and bleeding.   For now will continue rate control with carvedilol and have close follow up as outpatient.   Bipolar disorder ([place]) Will need outpatient follow up, currently off psychotropic medications.   Cocaine abuse (HCC) Tobacco abuse.  Patient tested positive for cocaine on admission.          Consultants: cardiology  Procedures performed: none   Disposition: Home Diet recommendation:  Cardiac diet DISCHARGE MEDICATION: Allergies as of 11/18/2021   No Known Allergies      Medication List     STOP taking these medications    furosemide 40 MG tablet Commonly known as: LASIX   sacubitril-valsartan 24-26 MG Commonly known as: ENTRESTO   spironolactone 25 MG tablet Commonly known as: ALDACTONE       TAKE these medications    atorvastatin 20 MG tablet Commonly known as: LIPITOR Take 1 tablet (20 mg total) by mouth at bedtime.   carvedilol 3.125 MG tablet Commonly known as: COREG Take 1 tablet (3.125 mg total) by mouth 2 (two) times daily with a meal.   dapagliflozin propanediol 10 MG Tabs tablet Commonly known as: FARXIGA Take 1 tablet (10 mg total) by mouth daily. Start taking on: November 19, 2021   fluticasone furoate-vilanterol 200-25 MCG/ACT Aepb Commonly known as: BREO ELLIPTA Inhale 1 puff into the lungs daily.   isosorbide-hydrALAZINE 20-37.5 MG tablet Commonly known as: BIDIL Take 1 tablet by mouth 2 (two) times daily.   ivabradine 5 MG Tabs tablet Commonly known as: CORLANOR Take 1 tablet (5 mg total) by mouth 2 (two) times daily with a meal.   multivitamin with minerals Tabs tablet Take 1 tablet by mouth daily.        Follow-up Information     [facility] Follow  up.   Specialty: PALLIATIVE CARE Contact information: [place] [phone]        [facility] Follow up.   Specialty: Rehabilitation Contact information: [address] [id number] mc [place]        Lawerance Cruel C, PA-C Follow up on 11/28/2021.   Specialty: Cardiology Why: 1pm Contact information: [place] [address] [phone]                Discharge Exam: Danley Danker Weights   11/17/21 0237 11/18/21 0108 11/18/21 0504  Weight: 85 kg 77.6 kg 77.6 kg   BP 104/73 Comment: awake  Pulse 89   Temp (!) 97.4 F (36.3 C) (Axillary)   Resp 16   Ht 5\' 4"  (1.626 m)   Wt 77.6 kg   SpO2 96%   BMI 29.37 kg/m    Patient with no chest pain, no dyspnea or lower extremity edema  Neurology awake and alert ENT with no pallor Cardiovascular with S1 and S2 present and rhythmic with no gallops, rubs or murmurs No JVD No lower extremity edema Respiratory with no rales or wheezing Abdomen not distended No lower extremity edema   Condition at discharge: stable  The results of significant diagnostics  from this hospitalization (including imaging, microbiology, ancillary and laboratory) are listed below for reference.   Imaging Studies: CT HEAD WO CONTRAST (5MM)  Result Date: 11/14/2021 CLINICAL DATA:  Mental status change.  Unknown cause. EXAM: CT HEAD WITHOUT CONTRAST TECHNIQUE: Contiguous axial images were obtained from the base of the skull through the vertex without intravenous contrast. RADIATION DOSE REDUCTION: This exam was performed according to the departmental dose-optimization program which includes automated exposure control, adjustment of the mA and/or kV according to patient size and/or use of iterative reconstruction technique. COMPARISON:  None Available. FINDINGS: Brain: No evidence of acute infarction,  hemorrhage, hydrocephalus, extra-axial collection or mass lesion/mass effect. Mild cerebral atrophy. Vascular: No hyperdense vessel or unexpected calcification. Skull: Normal. Negative for fracture or focal lesion. Sinuses/Orbits: No acute finding. Other: None. IMPRESSION: No acute intracranial abnormality. Electronically Signed   By: Keane Police D.O.   On: 11/14/2021 11:50   DG Chest Port 1 View  Result Date: 11/13/2021 CLINICAL DATA:  Shortness of breath. EXAM: PORTABLE CHEST 1 VIEW COMPARISON:  Chest radiograph 07/19/2021. FINDINGS: Enlarged cardiac silhouette. Diffuse mild interstitial prominence. Left basilar opacities. No definite pleural effusions or visible pneumothorax. Biapical pleuroparenchymal scarring. No displaced fracture. IMPRESSION: 1. Left basilar atelectasis and/or consolidation. 2. Mild diffuse interstitial prominence could represent mild interstitial edema or the sequela of recurrent bouts of CHF. 3. Cardiomegaly. Electronically Signed   By: Margaretha Sheffield M.D.   On: 11/13/2021 19:14   ECHOCARDIOGRAM COMPLETE  Result Date: 11/15/2021    ECHOCARDIOGRAM REPORT   Patient Name:   TYEESHA HERIN Date of Exam: 11/15/2021 Medical Rec #:  [id number]       Height:       64.0 in Accession #:    [id number]      Weight:       186.4 lb Date of Birth:  March 14, 1951        BSA:          1.899 m Patient Age:    17 years        BP:           112/79 mmHg Patient Gender: F               HR:           71 bpm. Exam Location:  Inpatient Procedure: 2D Echo, Cardiac Doppler and Color Doppler Indications:    Congestive heart failure  History:        Patient has prior history of Echocardiogram examinations, most                 recent 07/18/2021. COPD, Arrythmias:Atrial Flutter,                 Signs/Symptoms:Shortness of Breath; Risk Factors:Hypertension.                 Substance abuse.  Sonographer:    Joette Catching RCS Referring Phys: [place]  1. Left ventricular ejection fraction, by  estimation, is <20%. The left ventricle has severely decreased function. The left ventricle demonstrates global hypokinesis. The left ventricular internal cavity size was mildly dilated. Left ventricular diastolic parameters are consistent with Grade III diastolic dysfunction (restrictive). Elevated left ventricular end-diastolic pressure.  2. Right ventricular systolic function is severely reduced. The right ventricular size is moderately enlarged. There is moderately elevated pulmonary artery systolic pressure. The estimated right ventricular systolic pressure is [id number] mmHg.  3. Left atrial size was severely dilated.  4. Right atrial  size was moderately dilated.  5. Annular dilatation. The mitral valve is normal in structure. Moderate mitral valve regurgitation.  6. Annular dilatation. Tricuspid valve regurgitation is moderate to severe.  7. The aortic valve is tricuspid. Aortic valve regurgitation is trivial. No aortic stenosis is present.  8. The inferior vena cava is dilated in size with <50% respiratory variability, suggesting right atrial pressure of 15 mmHg. Comparison(s): Compared to the study done on 07/18/2021, LVEF has further reduced from 25 to 30% to the present 10 to 15%. Right ventricular function was previously normal and the size was normal. Conclusion(s)/Recommendation(s): Findings consistent with non-ischemic cardiomyopathy. FINDINGS  Left Ventricle: Left ventricular ejection fraction, by estimation, is <20%. The left ventricle has severely decreased function. The left ventricle demonstrates global hypokinesis. The left ventricular internal cavity size was mildly dilated. There is no  left ventricular hypertrophy. Left ventricular diastolic parameters are consistent with Grade III diastolic dysfunction (restrictive). Elevated left ventricular end-diastolic pressure. Right Ventricle: The right ventricular size is moderately enlarged. No increase in right ventricular wall thickness. Right ventricular  systolic function is severely reduced. There is moderately elevated pulmonary artery systolic pressure. The tricuspid regurgitant velocity is 2.87 m/s, and with an assumed right atrial pressure of 15 mmHg, the estimated right ventricular systolic pressure is 47.9 mmHg. Left Atrium: Left atrial size was severely dilated. Right Atrium: Right atrial size was moderately dilated. Pericardium: Trivial pericardial effusion is present. Mitral Valve: Annular dilatation. The mitral valve is normal in structure. Moderate mitral valve regurgitation. Tricuspid Valve: Annular dilatation. The tricuspid valve is normal in structure. Tricuspid valve regurgitation is moderate to severe. There is moderate prolapse of the tricuspid. Aortic Valve: The aortic valve is tricuspid. Aortic valve regurgitation is trivial. Aortic regurgitation PHT measures 425 msec. No aortic stenosis is present. Aortic valve mean gradient measures 4.5 mmHg. Aortic valve peak gradient measures 8.1 mmHg. Aortic valve area, by VTI measures 2.08 cm. Pulmonic Valve: The pulmonic valve was not well visualized. Pulmonic valve regurgitation is mild. Aorta: The aortic root is normal in size and structure. Venous: The inferior vena cava is dilated in size with less than 50% respiratory variability, suggesting right atrial pressure of 15 mmHg. IAS/Shunts: No atrial level shunt detected by color flow Doppler. Additional Comments: There is a small pleural effusion in the left lateral region.  LEFT VENTRICLE PLAX 2D LVIDd:         6.80 cm      Diastology LVIDs:         6.00 cm      LV e' medial:    4.03 cm/s LV PW:         0.80 cm      LV E/e' medial:  31.3 LV IVS:        0.80 cm      LV e' lateral:   8.16 cm/s LVOT diam:     2.10 cm      LV E/e' lateral: 15.5 LV SV:         44 LV SV Index:   23 LVOT Area:     3.46 cm  LV Volumes (MOD) LV vol d, MOD A2C: 166.0 ml LV vol d, MOD A4C: 164.0 ml LV vol s, MOD A2C: 123.0 ml LV vol s, MOD A4C: 118.0 ml LV SV MOD A2C:     43.0  ml LV SV MOD A4C:     164.0 ml LV SV MOD BP:      49.9 ml RIGHT VENTRICLE  IVC RV Basal diam:  4.00 cm  IVC diam: 3.20 cm RV Mid diam:    2.40 cm LEFT ATRIUM              Index        RIGHT ATRIUM           Index LA diam:        4.30 cm  2.26 cm/m   RA Area:     26.70 cm LA Vol (A2C):   79.5 ml  41.87 ml/m  RA Volume:   98.70 ml  51.98 ml/m LA Vol (A4C):   110.0 ml 57.93 ml/m LA Biplane Vol: 93.6 ml  49.30 ml/m  AORTIC VALVE                     PULMONIC VALVE AV Area (Vmax):    2.43 cm      PV Vmax:          0.81 m/s AV Area (Vmean):   2.02 cm      PV Peak grad:     2.6 mmHg AV Area (VTI):     2.08 cm      PR End Diast Vel: 11.11 msec AV Vmax:           142.50 cm/s AV Vmean:          102.000 cm/s AV VTI:            0.210 m AV Peak Grad:      8.1 mmHg AV Mean Grad:      4.5 mmHg LVOT Vmax:         100.10 cm/s LVOT Vmean:        59.367 cm/s LVOT VTI:          0.126 m LVOT/AV VTI ratio: 0.60 AI PHT:            425 msec  AORTA Ao Root diam: 3.00 cm Ao Asc diam:  3.50 cm MITRAL VALVE                TRICUSPID VALVE MV Area (PHT): 7.44 cm     TR Peak grad:   32.9 mmHg MV Decel Time: 102 msec     TR Vmax:        287.00 cm/s MR Peak grad: 77.9 mmHg MR Mean grad: 45.0 mmHg     SHUNTS MR Vmax:      441.33 cm/s   Systemic VTI:  0.13 m MR Vmean:     319.0 cm/s    Systemic Diam: 2.10 cm MV E velocity: 126.33 cm/s MV A velocity: 25.70 cm/s MV E/A ratio:  4.92 Adrian Prows MD Electronically signed by Adrian Prows MD Signature Date/Time: 11/15/2021/10:15:29 AM    Final     Microbiology: Results for orders placed or performed during the hospital encounter of 07/17/21  Resp Panel by RT-PCR (Flu A&B, Covid) Nasopharyngeal Swab     Status: None   Collection Time: 07/17/21 10:08 PM   Specimen: Nasopharyngeal Swab; Nasopharyngeal(NP) swabs in vial transport medium  Result Value Ref Range Status   SARS Coronavirus 2 by RT PCR NEGATIVE NEGATIVE Final    Comment: (NOTE) SARS-CoV-2 target nucleic acids are NOT  DETECTED.  The SARS-CoV-2 RNA is generally detectable in upper respiratory specimens during the acute phase of infection. The lowest concentration of SARS-CoV-2 viral copies this assay can detect is 138 copies/mL. A negative result does not preclude SARS-Cov-2 infection and should not be used as the sole  basis for treatment or other patient management decisions. A negative result may occur with  improper specimen collection/handling, submission of specimen other than nasopharyngeal swab, presence of viral mutation(s) within the areas targeted by this assay, and inadequate number of viral copies(<138 copies/mL). A negative result must be combined with clinical observations, patient history, and epidemiological information. The expected result is Negative.  Fact Sheet for Patients:  EntrepreneurPulse.com.au  Fact Sheet for Healthcare Providers:  IncredibleEmployment.be  This test is no t yet approved or cleared by the [place] FDA and  has been authorized for detection and/or diagnosis of SARS-CoV-2 by FDA under an Emergency Use Authorization (EUA). This EUA will remain  in effect (meaning this test can be used) for the duration of the COVID-19 declaration under Section 564(b)(1) of the Act, 21 U.S.C.section 360bbb-3(b)(1), unless the authorization is terminated  or revoked sooner.       Influenza A by PCR NEGATIVE NEGATIVE Final   Influenza B by PCR NEGATIVE NEGATIVE Final    Comment: (NOTE) The Xpert Xpress SARS-CoV-2/FLU/RSV plus assay is intended as an aid in the diagnosis of influenza from Nasopharyngeal swab specimens and should not be used as a sole basis for treatment. Nasal washings and aspirates are unacceptable for Xpert Xpress SARS-CoV-2/FLU/RSV testing.  Fact Sheet for Patients: EntrepreneurPulse.com.au  Fact Sheet for Healthcare Providers: IncredibleEmployment.be  This test is not yet  approved or cleared by the [place] FDA and has been authorized for detection and/or diagnosis of SARS-CoV-2 by FDA under an Emergency Use Authorization (EUA). This EUA will remain in effect (meaning this test can be used) for the duration of the COVID-19 declaration under Section 564(b)(1) of the Act, 21 U.S.C. section 360bbb-3(b)(1), unless the authorization is terminated or revoked.  Performed at [facility], [place] [address], [place], [place] [address]   Culture, blood (routine x 2)     Status: None   Collection Time: 07/18/21  3:42 AM   Specimen: BLOOD  Result Value Ref Range Status   Specimen Description BLOOD SITE NOT SPECIFIED  Final   Special Requests   Final    BOTTLES DRAWN AEROBIC AND ANAEROBIC Blood Culture adequate volume   Culture   Final    NO GROWTH 5 DAYS Performed at [facility], [address] [place], [place] [address]    Report Status 07/23/2021 FINAL  Final  Culture, blood (routine x 2)     Status: None   Collection Time: 07/18/21  5:03 AM   Specimen: BLOOD RIGHT HAND  Result Value Ref Range Status   Specimen Description BLOOD RIGHT HAND  Final   Special Requests   Final    BOTTLES DRAWN AEROBIC AND ANAEROBIC Blood Culture adequate volume   Culture   Final    NO GROWTH 5 DAYS Performed at [facility], [place] [address], [place], [place] [address]    Report Status 07/23/2021 FINAL  Final    Labs: CBC: Recent Labs  Lab 11/13/21 1843 11/14/21 0140 11/15/21 0312 11/16/21 0341 11/17/21 0355  WBC 5.9 6.1 6.2 5.7 5.7  NEUTROABS 3.4  --   --   --   --   HGB 14.2 14.5 13.6 12.5 13.4  HCT 45.0 45.3 42.6 38.5 40.3  MCV 88.9 87.3 87.5 85.6 83.6  PLT 167 151 169 170 [id number]   Basic Metabolic Panel: Recent Labs  Lab 11/13/21 1843 11/13/21 2106 11/14/21 0140 11/15/21 [id number] 11/16/21 0341 11/16/21 1143 11/17/21 0355 11/18/21 0417  NA  144  --  143 139 140 143 142 142  K 4.0  --  4.3 3.7 3.2* 3.7 4.1 3.4*  CL 114*  --  111 105 106  106 105 98  CO2 23  --  20* 24 30 31 30  36*  GLUCOSE 103*  --  138* 127* 94 93 96 94  BUN 22  --  21 22 21 19 18 15   CREATININE 1.63*  --  1.63* 1.61* 1.50* 1.41* 1.43* 1.33*  CALCIUM 8.5*  --  8.6* 8.5* 8.3* 8.4* 8.7* 8.6*  MG 1.8  --  1.7  --  1.6*  --  1.9 2.0  PHOS  --  2.1*  --   --   --   --   --   --    Liver Function Tests: Recent Labs  Lab 11/13/21 1843 11/14/21 0140 11/15/21 0312  AST 41 45* 34  ALT 49* 51* 48*  ALKPHOS 80 80 71  BILITOT 0.8 0.8 0.5  PROT 5.5* 5.7* 5.4*  ALBUMIN 3.0* 3.1* 2.9*   CBG: No results for input(s): GLUCAP in the last 168 hours.  Discharge time spent: greater than 30 minutes.  Signed: Tawni Millers, MD [facility] 11/18/2021

## 2021-11-18 NOTE — TOC Progression Note (Addendum)
Transition of Care [facility]) - Progression Note    Patient Details  Name: Valerie Rodgers MRN: [id number] Date of Birth: 04-11-1951  Transition of Care [facility]) CM/SW [place], [place] Phone Number: 11/18/2021, 2:21 PM  Clinical Narrative:     Met with pt to discuss substance use and resources. Pt states she lives at home with two roommates. She reports freebase cocaine use daily. She also endorses smoking weed multiple times/week but not daily as well as having a few alcoholic drinks on the weekends. She expresses some desire to quit or reduce substance use but is not interested in inpatient treatment(she likely is not appropriate for this level of care as she would need to be independent in ADL's and mobility). Pt has some history of substance use tx and explains she had completed drug court in 2011 and had an 57-month period of abstinence where she was participating in [place] and [place]. [place] explains outpatient options for treatment and also provides pt with a current AA meeting list(her preferred group). Pt states she uses the bus or friends for transportation. CSW also explains difficulties for TOC to arrange [place]; pt is agreeable to OPPT if [facility] is unable to be arranged.    Expected Discharge Plan: [place] Barriers to Discharge: Continued Medical Work up  Expected Discharge Plan and Services Expected Discharge Plan: [place]   Discharge Planning Services: CM Consult   Living arrangements for the past 2 months: Single Family Home                 DME Arranged: N/A         HH Arranged: PT, OT HH Agency: [facility] Date [place]: 11/17/21 Time [place]: 23 Representative spoke with at [place]: [place] ([place]) Interventions    Readmission Risk Interventions     View : No data to display.

## 2021-11-18 NOTE — Progress Notes (Signed)
Heart Failure Stewardship Pharmacist Progress Note   PCP: Pcp, No PCP-Cardiologist: None    HPI:  71 yo F with PMH of CKD, cirrhosis of liver, COPD on chronic O2, bipolar, cocaine use, and systolic CHF who presented to [facility] ED 05/28 with dyspnea and LEE over past 3 days.    She was admitted 01/29 through 02/04 for an acute on chronic CHF exacerbation secondary to cocaine use.  Diuresed with discharge weighing 160.6 lbs.  Echo on 01/30 during that admission revealed newly reduced LVEF of 25-30% with G3DD.  Stress test on 02/02 was negative for inducible ischemia with evidence of global hypokinesis and marked LVE.  She was discharged on Entresto, Corlanor, and Lasix 40 mg daily; spironolactone was initiated during hospital stay but discontinued with hyperkalemia of 5.2 (Lokelma given).  Her PTA amlodipine and HCTZ were discontinued.  Followed up with [facility] 02/16 where she admitted to continued cocaine use and reported that she was still taking the HCTZ and amlodipine and was nonadherent to Corlanor.    At 03/02 cardiology follow-up visit, she was still using cocaine but denied dyspnea, orthopnea or LEE.  Spironolactone 12.5 mg daily was added.  Still no beta-blockers with persistent cocaine use.    Now, she is admitted for another CHF exacerbation secondary to cocaine use.  CXR in the ED showed mild diffuse interstitial prominence, indicative of either mild interstitial edema or sequela of CHF exacerbations.  Echo on 05/30 revealed severely reduced EF of <20%, severely reduced RV function with elevated pulmonary pressures, moderate-severe biatrial dilation, and moderate-severe TR - all new since Jan admission.  The G3DD and elevated LVEDP remains the same.    She remained volume overload with JVD and 2+ LEE evident on exam 05/30.  Carvedilol 3.125 mg BID was initiated before it was discontinued due to her severe biventricular failure and fluid still on board. It has since been restarted  for aflutter.  Current HF Medications: Diuretic: furosemide 40 mg PO daily Beta Blocker: carvedilol 3.125 mg BID SGLT2i: Farxiga 10 mg daily Other: BiDil 1 tab BID; Corlanor 5 mg BID  Prior to admission HF Medications: Not taking any PTA medications - most Rx's last filled in February. Entresto filled in March and April.  Prescribed the following -  Diuretic: Lasix 40 mg daily ACE/ARB/ARNI: Entresto 24-26 mg BID Aldosterone Antagonist: Spironolactone 12.5 mg daily  Other: Corlanor 5 mg BID  Pertinent Lab Values: Serum creatinine 1.33 (baseline ~1.2), BUN 15, Potassium 3.4, Sodium 142, BNP 1828, Magnesium 2.0, A1c 6%  Vital Signs: Weight: 171 lbs (admission weight: 188 lbs) Blood pressure: 100/70s  Heart rate: 80-90 - HR occasionally dropping to 40-50s (in afib) I/O: -3.8L yesterday; net -8.3L  Medication Assistance / Insurance Benefits Check: Does the patient have prescription insurance?  Yes Type of insurance plan: UHC Medicare / Medicaid [place] Access  Outpatient Pharmacy:  Prior to admission [facility] Is the patient willing to use [facility] TOC pharmacy at discharge? Yes Is the patient willing to transition their outpatient pharmacy to utilize a [facility] outpatient pharmacy?   Pending    Assessment: 1. Acute on chronic systolic CHF (LVEF <20%), due to ongoing cocaine use. NYHA class II symptoms. Renal function improving.  Still on 2L [place].  Weight down and I/O good - Off IV lasix - transitioned to PO 40 mg daily - Caution BB with biventricular failure - Continue Farxiga 10 mg daily - No digoxin for RV support with AKI and noncompliance  -  Continue BiDil 1 tab BID - Continue Corlanor 5 mg BID - Discussed LifeVest with cardiology given persistent and worsening LV dysfunction since last ECHO. Thought to not be a great candidate given noncompliance and ongoing drug use.   Plan: 1) Medication changes recommended at this time: - Continue current regimen  2)  Patient assistance: - None - has Medicaid - Corlanor, Farxiga, and BiDil covered with $0 copay  3)  Education  - To be completed prior to discharge  Sharen Hones, PharmD, BCPS Heart Failure Stewardship Pharmacist Phone [phone]  Please check [email or web address] for unit-specific pharmacist phone numbers

## 2021-11-18 NOTE — Progress Notes (Signed)
Mobility Specialist Progress Note:   11/18/21 1354  Mobility  Activity Transferred from chair to bed  Level of Assistance Minimal assist, patient does 75% or more  Assistive Device  (HHA)  Distance Ambulated (ft) 4 ft  Activity Response Tolerated well  $Mobility charge 1 Mobility   Pt received in chair wanting to get back to bed. No complaints of pain. Left in bed with call bell in reach and all needs met.   [facility] Cordial Mobility Specialist

## 2021-11-21 ENCOUNTER — Telehealth: Payer: Self-pay

## 2021-11-21 NOTE — Telephone Encounter (Signed)
Attempted to contact patient to schedule a Palliative Care consult appointment. No answer left a message to return call.  

## 2021-11-28 ENCOUNTER — Ambulatory Visit: Payer: Medicare Other | Admitting: Student

## 2021-11-29 ENCOUNTER — Telehealth: Payer: Self-pay

## 2021-11-29 NOTE — Telephone Encounter (Signed)
Spoke with patient and scheduled a telephonic Palliative Consult for 12/05/21 @ 2 PM.   Consent obtained; updated Netsmart, Team List and Epic.

## 2021-12-05 ENCOUNTER — Other Ambulatory Visit: Payer: Medicare Other | Admitting: Internal Medicine

## 2021-12-05 DIAGNOSIS — Z515 Encounter for palliative care: Secondary | ICD-10-CM

## 2021-12-05 DIAGNOSIS — I4892 Unspecified atrial flutter: Secondary | ICD-10-CM

## 2021-12-05 DIAGNOSIS — F1410 Cocaine abuse, uncomplicated: Secondary | ICD-10-CM

## 2021-12-05 DIAGNOSIS — N1831 Chronic kidney disease, stage 3a: Secondary | ICD-10-CM

## 2021-12-05 DIAGNOSIS — F319 Bipolar disorder, unspecified: Secondary | ICD-10-CM

## 2021-12-05 DIAGNOSIS — I5020 Unspecified systolic (congestive) heart failure: Secondary | ICD-10-CM

## 2021-12-05 DIAGNOSIS — J9611 Chronic respiratory failure with hypoxia: Secondary | ICD-10-CM

## 2021-12-05 MED ORDER — ADULT MULTIVITAMIN W/MINERALS CH: 1.0000 | ORAL_TABLET | Freq: Every day | ORAL | 3 refills | Status: AC

## 2021-12-05 NOTE — Progress Notes (Signed)
Therapist, nutritional Palliative Care Consult Note Telephone: [phone]  Fax: [phone]   Date of encounter: 12/05/21 12:11 PM PATIENT NAME: Valerie Rodgers [address] [place] [address]   [phone] (home) [phone] (work) DOB: 17-Sep-1950 MRN: [id number] PRIMARY CARE PROVIDER:    [facility]  REFERRING PROVIDER: Lamarr Lulas, NP at [facility]  RESPONSIBLE PARTY:    Contact Information     Name Relation Home Work Mobile   Valerie Rodgers [phone]          Due to the COVID-19 crisis, this visit was done via telemedicine from my office and it was initiated and consent by this patient and or family.  I connected with  Valerie Rodgers OR PROXY on 12/05/21 by a telemedicine application and verified that I am speaking with the correct person using two identifiers.  This patient did not have the ability to connect via a video-enabled capacity.   I discussed the limitations of evaluation and management by telemedicine. The patient expressed understanding and agreed to proceed.  Palliative Care was asked to follow this patient by consultation request of Valerie Lulas, NP to address advance care planning and complex medical decision making. This is the initial visit.                                     ASSESSMENT AND PLAN / RECOMMENDATIONS:   Advance Care Planning/Goals of Care: Goals include to maximize quality of life and symptom management. Patient/health care surrogate gave his/her permission to discuss.Our advance care planning conversation included a discussion about:    The value and importance of advance care planning  Experiences with loved ones who have been seriously ill or have died  Exploration of personal, cultural or spiritual beliefs that might influence medical decisions  Exploration of goals of care in the event of a sudden injury or illness  Identification  of a healthcare agent  Review and updating or  creation of an  advance directive document . Decision not to resuscitate or to de-escalate disease focused treatments due to poor prognosis. CODE STATUS:DNR after palliative consult at hospital Says if she dies, she's had a wonderful life.  She does not want CPR if her heart stops.  She would not want to go on a ventilator.  Her old and younger brother are deceased.  She loves her old home.  There's no place like home--wants to stay there forever.  There are blooming cherry trees outside  willing to go back to hospital, ok with IVFs and abx if got better from it.  No feeding tube.  She had been going to [facility].  She is now going to [facility] starting 6/29 for primary care.   Symptom Management/Plan: 1. HFrEF (heart failure with reduced ejection fraction) (HCC) -discussed importance of getting a new scale, weighing daily and reporting fluid weight gain, elevating feet, avoiding high sodium foods -f/u with cardio and establish with PCP  2. Atrial flutter, paroxysmal (HCC) -declined anticoagulation, monitor carefully for rate control, as well  3. Chronic kidney disease, stage 3a (HCC) -avoid nephrotoxic meds, requires close monitoring for diuretic adjustments  4. Chronic respiratory failure with hypoxia (HCC) -cont 2L O2 via [place] -avoid smoking and drug use  5. Cocaine abuse (HCC) -reports no use since hospitalization, but temptation is there--counseled about dangers in view of her CHF especially  6. Bipolar affective  disorder, remission status unspecified ([place]) -continue regular medications and f/u   7. Palliative care by specialist -initiated MOST discussion during phone call today--will complete form when in person     Follow up Palliative Care Visit: Palliative care will continue to follow for complex medical decision making, advance care planning, and clarification of goals. Return 2022-02-08   This visit was coded based on medical decision making (MDM). 16 mins on  acp plus phone call discussion about med conditions above.  PPS: 50%  HOSPICE ELIGIBILITY/DIAGNOSIS: HFrEF/not at this time  Chief Complaint: initial palliative phone consult  HISTORY OF PRESENT ILLNESS:  Valerie Rodgers is a 71 y.o. year old female  with chronic combined chf, cirrhosis, copd, chronic respiratory failure on 2L O2, afib, bipolar disorder, tobacco abuse, alcohol and cocaine use..   She is doing a lot better.  Her legs keep swelling up.  She has been taking the meds b/c she wants to know more about them and is reading.  They just elevated her legs and they are going down now.  She threw her scale out b/c it was going up.  She is using 2L of O2 as directed.  She slept better last night.    She is still smoking.  She is not wearing her oxygen.  She denies cocaine use.  She's hoping to make it through the whole week.     She wants to get a power chair so she can go to the store w/o relying on others to bring her.  She has two roommates now.    Is for another test in august on her heart.    History obtained from review of EMR, discussion with primary team, and interview with family, facility staff/caregiver and/or Valerie Rodgers.   I reviewed available labs, medications, imaging, studies and related documents from the EMR.  Records reviewed and summarized above.   ROS  Review of Systems  Constitutional:  Positive for activity change, appetite change and unexpected weight change.  Respiratory:  Positive for shortness of breath. Negative for chest tightness.   Cardiovascular:  Positive for leg swelling. Negative for chest pain.  Gastrointestinal:  Negative for abdominal pain.  Musculoskeletal:  Negative for gait problem.  Psychiatric/Behavioral:         Bipolar    There is no height or weight on file to calculate BMI. Wt Readings from Last 500 Encounters:  11/18/21 171 lb 1.2 oz (77.6 kg)  08/18/21 166 lb 6.4 oz (75.5 kg)  08/10/21 160 lb (72.6 kg)  08/04/21 171 lb (77.6  kg)  07/23/21 160 lb 14.4 oz (73 kg)  01/17/17 191 lb (86.6 kg)  01/04/16 190 lb (86.2 kg)  12/22/15 190 lb 12.8 oz (86.5 kg)    CURRENT PROBLEM LIST:  Patient Active Problem List   Diagnosis Date Noted   Acute metabolic encephalopathy 0000000   Acute on chronic combined systolic and diastolic CHF (congestive heart failure) ([place]) 11/13/2021   Atrial flutter, paroxysmal (HCC) 11/13/2021   Elevated LFTs 08/10/2021   Hyperkalemia 07/22/2021   HFrEF (heart failure with reduced ejection fraction) (HCC)    Acute congestive heart failure ([place]) 07/18/2021   Cocaine abuse ([place]) 07/18/2021   Acute cardiogenic pulmonary edema ([place]) 07/18/2021   Acute on chronic respiratory failure with hypoxia ([place]) 07/18/2021   Bipolar disorder ([place]) 07/18/2021   Elevated d-dimer 07/18/2021   Essential hypertension 07/18/2021   Chronic kidney disease, stage 3a ([place]) 07/18/2021   PAST MEDICAL HISTORY:  Active Ambulatory Problems  Diagnosis Date Noted   Acute congestive heart failure (HCC) 07/18/2021   Cocaine abuse (HCC) 07/18/2021   Acute cardiogenic pulmonary edema (HCC) 07/18/2021   Acute on chronic respiratory failure with hypoxia (HCC) 07/18/2021   Bipolar disorder (HCC) 07/18/2021   Elevated d-dimer 07/18/2021   Essential hypertension 07/18/2021   Chronic kidney disease, stage 3a (HCC) 07/18/2021   HFrEF (heart failure with reduced ejection fraction) (HCC)    Hyperkalemia 07/22/2021   Elevated LFTs 08/10/2021   Acute on chronic combined systolic and diastolic CHF (congestive heart failure) (HCC) 11/13/2021   Atrial flutter, paroxysmal (HCC) 11/13/2021   Acute metabolic encephalopathy 11/14/2021   Resolved Ambulatory Problems    Diagnosis Date Noted   No Resolved Ambulatory Problems   Past Medical History:  Diagnosis Date   Arthritis    Bipolar affective disorder (HCC)    Cataract    SOCIAL HX:  Social History   Tobacco Use   Smoking status: Some Days    Packs/day: 0.25     Years: 65.00    Total pack years: 16.25    Types: Cigarettes   Smokeless tobacco: Never  Substance Use Topics   Alcohol use: Yes    Comment: 1 pint weekly   FAMILY HX:  Family History  Problem Relation Age of Onset   Stroke Father    Colon cancer Neg Hx    Heart disease Neg Hx       ALLERGIES: No Known Allergies    PERTINENT MEDICATIONS:  Outpatient Encounter Medications as of 12/05/2021  Medication Sig   atorvastatin (LIPITOR) 20 MG tablet Take 1 tablet (20 mg total) by mouth at bedtime.   carvedilol (COREG) 3.125 MG tablet Take 1 tablet (3.125 mg total) by mouth 2 (two) times daily with a meal.   dapagliflozin propanediol (FARXIGA) 10 MG TABS tablet Take 1 tablet (10 mg total) by mouth daily.   fluticasone furoate-vilanterol (BREO ELLIPTA) 200-25 MCG/ACT AEPB Inhale 1 puff into the lungs daily. (Patient not taking: Reported on 11/13/2021)   isosorbide-hydrALAZINE (BIDIL) 20-37.5 MG tablet Take 1 tablet by mouth 2 (two) times daily.   ivabradine (CORLANOR) 5 MG TABS tablet Take 1 tablet (5 mg total) by mouth 2 (two) times daily with a meal.   Multiple Vitamin (MULTIVITAMIN WITH MINERALS) TABS tablet Take 1 tablet by mouth daily. (Patient not taking: Reported on 11/13/2021)   No facility-administered encounter medications on file as of 12/05/2021.    Thank you for the opportunity to participate in the care of Ms. Thorner.  The palliative care team will continue to follow. Please call our office at [phone] if we can be of additional assistance.   Bufford Spikes, DO  COVID-19 PATIENT SCREENING TOOL Asked and negative response unless otherwise noted:  Have you had symptoms of covid, tested positive or been in contact with someone with symptoms/positive test in the past 5-10 days?  NO

## 2021-12-19 ENCOUNTER — Encounter: Payer: Self-pay | Admitting: Internal Medicine

## 2022-01-24 ENCOUNTER — Encounter: Payer: Medicare Other | Admitting: Internal Medicine

## 2022-01-27 NOTE — Progress Notes (Signed)
This encounter was created in error - please disregard.  Arrived at pt's home for scheduled visit.  She was seated in depends and upper body clothing with a young man at a table in her home.  It appeared drug paraphenalia were present on the table.  She reported she had "tried calling us" due to a death in her family and planned to call back to reschedule.

## 2022-02-08 NOTE — Progress Notes (Deleted)
Cardiology Office Note:   Date:  02/08/2022  NAME:  Valerie Rodgers    MRN: [id number] DOB:  10-04-1950   PCP:  Pcp, No  Cardiologist:  None  Electrophysiologist:  None   Referring MD: Hillery Aldo, NP   No chief complaint on file. ***  History of Present Illness:   Valerie Rodgers is a 71 y.o. female with a hx of systolic heart failure, atrial flutter, cocaine abuse who is being seen today for the evaluation of some folic heart failure at the request of Hillery Aldo, NP.  Problem List Systolic HF -EF <20% 10/2021 -MPI normal 07/2021 2. Atrial flutter  -refuses AC 3. Cocaine abuse  4. CKD IIIb 5. HTN  Past Medical History: Past Medical History:  Diagnosis Date   Arthritis    Bipolar affective disorder (HCC)    Cataract     Past Surgical History: Past Surgical History:  Procedure Laterality Date   ABDOMINAL HYSTERECTOMY      Current Medications: No outpatient medications have been marked as taking for the 02/09/22 encounter (Appointment) with Sande Rives, MD.     Allergies:    Patient has no known allergies.   Social History: Social History   Socioeconomic History   Marital status: Widowed    Spouse name: Not on file   Number of children: 1   Years of education: Not on file   Highest education level: Not on file  Occupational History   Not on file  Tobacco Use   Smoking status: Some Days    Packs/day: 0.25    Years: 65.00    Total pack years: 16.25    Types: Cigarettes   Smokeless tobacco: Never  Vaping Use   Vaping Use: Never used  Substance and Sexual Activity   Alcohol use: Yes    Comment: 1 pint weekly   Drug use: Yes    Types: Cocaine, Marijuana   Sexual activity: Not on file  Other Topics Concern   Not on file  Social History Narrative   Not on file   Social Determinants of Health   Financial Resource Strain: Not on file  Food Insecurity: Not on file  Transportation Needs: Not on file  Physical Activity: Not on file   Stress: Not on file  Social Connections: Not on file     Family History: The patient's ***family history includes Stroke in her father. There is no history of Colon cancer or Heart disease.  ROS:   All other ROS reviewed and negative. Pertinent positives noted in the HPI.     EKGs/Labs/Other Studies Reviewed:   The following studies were personally reviewed by me today:  EKG:  EKG is *** ordered today.  The ekg ordered today demonstrates ***, and was personally reviewed by me.   TTE 11/15/2021  1. Left ventricular ejection fraction, by estimation, is <20%. The left  ventricle has severely decreased function. The left ventricle demonstrates  global hypokinesis. The left ventricular internal cavity size was mildly  dilated. Left ventricular  diastolic parameters are consistent with Grade III diastolic dysfunction  (restrictive). Elevated left ventricular end-diastolic pressure.   2. Right ventricular systolic function is severely reduced. The right  ventricular size is moderately enlarged. There is moderately elevated  pulmonary artery systolic pressure. The estimated right ventricular  systolic pressure is 47.9 mmHg.   3. Left atrial size was severely dilated.   4. Right atrial size was moderately dilated.   5. Annular dilatation. The mitral valve is  normal in structure. Moderate  mitral valve regurgitation.   6. Annular dilatation. Tricuspid valve regurgitation is moderate to  severe.   7. The aortic valve is tricuspid. Aortic valve regurgitation is trivial.  No aortic stenosis is present.   8. The inferior vena cava is dilated in size with <50% respiratory  variability, suggesting right atrial pressure of 15 mmHg.   Recent Labs: 11/13/2021: TSH 3.570 11/15/2021: ALT 48 11/17/2021: Hemoglobin 13.4; Platelets 196 11/18/2021: B Natriuretic Peptide 958.7; BUN 15; Creatinine, Ser 1.33; Magnesium 2.0; Potassium 3.4; Sodium 142   Recent Lipid Panel    Component Value Date/Time    CHOL 109 11/18/2021 0417   TRIG 49 11/18/2021 0417   HDL 44 11/18/2021 0417   CHOLHDL 2.5 11/18/2021 0417   VLDL 10 11/18/2021 0417   LDLCALC 55 11/18/2021 0417   LDLDIRECT 47.3 11/18/2021 0417    Physical Exam:   VS:  There were no vitals taken for this visit.   Wt Readings from Last 3 Encounters:  11/18/21 171 lb 1.2 oz (77.6 kg)  08/18/21 166 lb 6.4 oz (75.5 kg)  08/10/21 160 lb (72.6 kg)    General: Well nourished, well developed, in no acute distress Head: Atraumatic, normal size  Eyes: PEERLA, EOMI  Neck: Supple, no JVD Endocrine: No thryomegaly Cardiac: Normal S1, S2; RRR; no murmurs, rubs, or gallops Lungs: Clear to auscultation bilaterally, no wheezing, rhonchi or rales  Abd: Soft, nontender, no hepatomegaly  Ext: No edema, pulses 2+ Musculoskeletal: No deformities, BUE and BLE strength normal and equal Skin: Warm and dry, no rashes   Neuro: Alert and oriented to person, place, time, and situation, CNII-XII grossly intact, no focal deficits  Psych: Normal mood and affect   ASSESSMENT:   Valerie Rodgers is a 71 y.o. female who presents for the following: No diagnosis found.  PLAN:   There are no diagnoses linked to this encounter.  {Are you ordering a CV Procedure (e.g. stress test, cath, DCCV, TEE, etc)?   Press F2        :[id number]}  Disposition: No follow-ups on file.  Medication Adjustments/Labs and Tests Ordered: Current medicines are reviewed at length with the patient today.  Concerns regarding medicines are outlined above.  No orders of the defined types were placed in this encounter.  No orders of the defined types were placed in this encounter.   There are no Patient Instructions on file for this visit.   Time Spent with Patient: I have spent a total of *** minutes with patient reviewing hospital notes, telemetry, EKGs, labs and examining the patient as well as establishing an assessment and plan that was discussed with the patient.  > 50% of time  was spent in direct patient care.  Signed, Lenna Gilford. Flora Lipps, MD, [facility]  [facility]  [address] [place], [place] [address] [phone]  02/08/2022 3:53 PM

## 2022-02-09 ENCOUNTER — Ambulatory Visit: Payer: Medicare Other | Admitting: Cardiovascular Disease

## 2022-02-09 DIAGNOSIS — D6869 Other thrombophilia: Secondary | ICD-10-CM

## 2022-02-09 DIAGNOSIS — I484 Atypical atrial flutter: Secondary | ICD-10-CM

## 2022-02-09 DIAGNOSIS — I5042 Chronic combined systolic (congestive) and diastolic (congestive) heart failure: Secondary | ICD-10-CM

## 2022-03-12 ENCOUNTER — Emergency Department (HOSPITAL_COMMUNITY): Payer: Medicare Other

## 2022-03-12 ENCOUNTER — Other Ambulatory Visit: Payer: Self-pay

## 2022-03-12 ENCOUNTER — Inpatient Hospital Stay (HOSPITAL_COMMUNITY)
Admission: EM | Admit: 2022-03-12 | Discharge: 2022-03-20 | DRG: 291 | Disposition: A | Discharge status: Home Health | Payer: Medicare Other | Attending: Internal Medicine | Admitting: Internal Medicine

## 2022-03-12 ENCOUNTER — Encounter (HOSPITAL_COMMUNITY): Payer: Self-pay | Admitting: Emergency Medicine

## 2022-03-12 DIAGNOSIS — Z515 Encounter for palliative care: Secondary | ICD-10-CM | POA: Diagnosis not present

## 2022-03-12 DIAGNOSIS — I4891 Unspecified atrial fibrillation: Secondary | ICD-10-CM | POA: Diagnosis present

## 2022-03-12 DIAGNOSIS — I4892 Unspecified atrial flutter: Secondary | ICD-10-CM | POA: Diagnosis present

## 2022-03-12 DIAGNOSIS — L89322 Pressure ulcer of left buttock, stage 2: Secondary | ICD-10-CM | POA: Diagnosis present

## 2022-03-12 DIAGNOSIS — N1831 Chronic kidney disease, stage 3a: Secondary | ICD-10-CM | POA: Diagnosis present

## 2022-03-12 DIAGNOSIS — I071 Rheumatic tricuspid insufficiency: Secondary | ICD-10-CM | POA: Diagnosis present

## 2022-03-12 DIAGNOSIS — I498 Other specified cardiac arrhythmias: Secondary | ICD-10-CM | POA: Diagnosis present

## 2022-03-12 DIAGNOSIS — Z90710 Acquired absence of both cervix and uterus: Secondary | ICD-10-CM | POA: Diagnosis not present

## 2022-03-12 DIAGNOSIS — N1832 Chronic kidney disease, stage 3b: Secondary | ICD-10-CM | POA: Diagnosis present

## 2022-03-12 DIAGNOSIS — J9621 Acute and chronic respiratory failure with hypoxia: Secondary | ICD-10-CM | POA: Diagnosis present

## 2022-03-12 DIAGNOSIS — I501 Left ventricular failure: Secondary | ICD-10-CM

## 2022-03-12 DIAGNOSIS — F1410 Cocaine abuse, uncomplicated: Secondary | ICD-10-CM | POA: Diagnosis present

## 2022-03-12 DIAGNOSIS — I130 Hypertensive heart and chronic kidney disease with heart failure and stage 1 through stage 4 chronic kidney disease, or unspecified chronic kidney disease: Secondary | ICD-10-CM | POA: Diagnosis not present

## 2022-03-12 DIAGNOSIS — Z79899 Other long term (current) drug therapy: Secondary | ICD-10-CM

## 2022-03-12 DIAGNOSIS — N189 Chronic kidney disease, unspecified: Secondary | ICD-10-CM | POA: Diagnosis present

## 2022-03-12 DIAGNOSIS — L89329 Pressure ulcer of left buttock, unspecified stage: Secondary | ICD-10-CM | POA: Diagnosis present

## 2022-03-12 DIAGNOSIS — J441 Chronic obstructive pulmonary disease with (acute) exacerbation: Secondary | ICD-10-CM | POA: Diagnosis present

## 2022-03-12 DIAGNOSIS — F319 Bipolar disorder, unspecified: Secondary | ICD-10-CM | POA: Diagnosis present

## 2022-03-12 DIAGNOSIS — Z66 Do not resuscitate: Secondary | ICD-10-CM | POA: Diagnosis present

## 2022-03-12 DIAGNOSIS — I5023 Acute on chronic systolic (congestive) heart failure: Secondary | ICD-10-CM | POA: Diagnosis present

## 2022-03-12 DIAGNOSIS — F17210 Nicotine dependence, cigarettes, uncomplicated: Secondary | ICD-10-CM | POA: Diagnosis present

## 2022-03-12 DIAGNOSIS — R609 Edema, unspecified: Secondary | ICD-10-CM | POA: Diagnosis not present

## 2022-03-12 DIAGNOSIS — I440 Atrioventricular block, first degree: Secondary | ICD-10-CM | POA: Diagnosis present

## 2022-03-12 DIAGNOSIS — Z1152 Encounter for screening for COVID-19: Secondary | ICD-10-CM

## 2022-03-12 DIAGNOSIS — J449 Chronic obstructive pulmonary disease, unspecified: Secondary | ICD-10-CM | POA: Diagnosis present

## 2022-03-12 DIAGNOSIS — N179 Acute kidney failure, unspecified: Secondary | ICD-10-CM | POA: Diagnosis present

## 2022-03-12 DIAGNOSIS — Z823 Family history of stroke: Secondary | ICD-10-CM | POA: Diagnosis not present

## 2022-03-12 DIAGNOSIS — Z91148 Patient's other noncompliance with medication regimen for other reason: Secondary | ICD-10-CM

## 2022-03-12 DIAGNOSIS — Z9981 Dependence on supplemental oxygen: Secondary | ICD-10-CM | POA: Diagnosis not present

## 2022-03-12 DIAGNOSIS — E782 Mixed hyperlipidemia: Secondary | ICD-10-CM | POA: Diagnosis present

## 2022-03-12 DIAGNOSIS — Z91119 Patient's noncompliance with dietary regimen due to unspecified reason: Secondary | ICD-10-CM

## 2022-03-12 DIAGNOSIS — I10 Essential (primary) hypertension: Secondary | ICD-10-CM | POA: Diagnosis present

## 2022-03-12 DIAGNOSIS — I5043 Acute on chronic combined systolic (congestive) and diastolic (congestive) heart failure: Secondary | ICD-10-CM | POA: Diagnosis present

## 2022-03-12 DIAGNOSIS — Z7151 Drug abuse counseling and surveillance of drug abuser: Secondary | ICD-10-CM

## 2022-03-12 DIAGNOSIS — I471 Supraventricular tachycardia, unspecified: Secondary | ICD-10-CM | POA: Diagnosis present

## 2022-03-12 LAB — CBC WITH DIFFERENTIAL/PLATELET
Abs Immature Granulocytes: 0.04 10*3/uL (ref 0.00–0.07)
Basophils Absolute: 0 10*3/uL (ref 0.0–0.1)
Basophils Relative: 0 %
Eosinophils Absolute: 0 10*3/uL (ref 0.0–0.5)
Eosinophils Relative: 0 %
HCT: 42.2 % (ref 36.0–46.0)
Hemoglobin: 13.8 g/dL (ref 12.0–15.0)
Immature Granulocytes: 1 %
Lymphocytes Relative: 11 %
Lymphs Abs: 0.9 10*3/uL (ref 0.7–4.0)
MCH: 27.1 pg (ref 26.0–34.0)
MCHC: 32.7 g/dL (ref 30.0–36.0)
MCV: 82.9 fL (ref 80.0–100.0)
Monocytes Absolute: 0.5 10*3/uL (ref 0.1–1.0)
Monocytes Relative: 7 %
Neutro Abs: 6.6 10*3/uL (ref 1.7–7.7)
Neutrophils Relative %: 81 %
Platelets: 246 10*3/uL (ref 150–400)
RBC: 5.09 MIL/uL (ref 3.87–5.11)
RDW: 20.2 % — ABNORMAL HIGH (ref 11.5–15.5)
WBC: 8 10*3/uL (ref 4.0–10.5)
nRBC: 0.9 % — ABNORMAL HIGH (ref 0.0–0.2)

## 2022-03-12 LAB — COMPREHENSIVE METABOLIC PANEL
ALT: 25 U/L (ref 0–44)
AST: 33 U/L (ref 15–41)
Albumin: 3.3 g/dL — ABNORMAL LOW (ref 3.5–5.0)
Alkaline Phosphatase: 107 U/L (ref 38–126)
Anion gap: 15 (ref 5–15)
BUN: 23 mg/dL (ref 8–23)
CO2: 23 mmol/L (ref 22–32)
Calcium: 9 mg/dL (ref 8.9–10.3)
Chloride: 104 mmol/L (ref 98–111)
Creatinine, Ser: 1.53 mg/dL — ABNORMAL HIGH (ref 0.44–1.00)
GFR, Estimated: 36 mL/min — ABNORMAL LOW (ref >60)
Glucose, Bld: 115 mg/dL — ABNORMAL HIGH (ref 70–99)
Potassium: 4.7 mmol/L (ref 3.5–5.1)
Sodium: 142 mmol/L (ref 135–145)
Total Bilirubin: 2 mg/dL — ABNORMAL HIGH (ref 0.3–1.2)
Total Protein: 6.9 g/dL (ref 6.5–8.1)

## 2022-03-12 LAB — MAGNESIUM: Magnesium: 1.9 mg/dL (ref 1.7–2.4)

## 2022-03-12 LAB — BRAIN NATRIURETIC PEPTIDE: B Natriuretic Peptide: 3743.2 pg/mL — ABNORMAL HIGH (ref 0.0–100.0)

## 2022-03-12 MED ORDER — METHYLPREDNISOLONE SODIUM SUCC 125 MG IJ SOLR
60.0000 mg | Freq: Two times a day (BID) | INTRAMUSCULAR | Status: DC
  Administered 2022-03-13 (×2): 60 mg via INTRAVENOUS
  Filled 2022-03-12 (×2): qty 2

## 2022-03-12 MED ORDER — FUROSEMIDE 10 MG/ML IJ SOLN
40.0000 mg | Freq: Once | INTRAMUSCULAR | Status: AC
  Administered 2022-03-12: 40 mg via INTRAVENOUS
  Filled 2022-03-12: qty 4

## 2022-03-12 MED ORDER — NICOTINE 21 MG/24HR TD PT24
21.0000 mg | MEDICATED_PATCH | Freq: Every day | TRANSDERMAL | Status: DC
Start: 2022-03-13 — End: 2022-03-20
  Administered 2022-03-13 – 2022-03-20 (×8): 21 mg via TRANSDERMAL
  Filled 2022-03-12 (×8): qty 1

## 2022-03-12 MED ORDER — IPRATROPIUM-ALBUTEROL 0.5-2.5 (3) MG/3ML IN SOLN: 3.0000 mL | RESPIRATORY_TRACT | Status: DC | PRN

## 2022-03-12 MED ORDER — IPRATROPIUM-ALBUTEROL 0.5-2.5 (3) MG/3ML IN SOLN
3.0000 mL | Freq: Four times a day (QID) | RESPIRATORY_TRACT | Status: DC
  Administered 2022-03-13 (×2): 3 mL via RESPIRATORY_TRACT
  Filled 2022-03-12 (×2): qty 3

## 2022-03-12 NOTE — ED Provider Notes (Signed)
[facility] EMERGENCY DEPARTMENT Provider Note   CSN: [id number] Arrival date & time: 03/12/22  1720     History  Chief Complaint  Patient presents with   Leg Swelling   Weakness    Valerie Rodgers is a 71 y.o. female.  71 year old female brought in by EMS from home where she lives with roommates who reported she has not been out of bed for several days.  Patient states that her legs are swollen and weeping.  She is unclear if she has been compliant with her medications including her Lasix.  Denies chest pain or shortness of breath.  States that her mouth is very dry and she is very thirsty. Patient has home O2, unsure if she has been compliant with use, EMS concerned about non compliance and poor living conditions. History of cocaine use.        Home Medications Prior to Admission medications   Medication Sig Start Date End Date Taking? Authorizing Provider  atorvastatin (LIPITOR) 20 MG tablet Take 1 tablet (20 mg total) by mouth at bedtime. 11/18/21 12/18/21  Arrien, York Ram, MD  carvedilol (COREG) 3.125 MG tablet Take 1 tablet (3.125 mg total) by mouth 2 (two) times daily with a meal. 11/18/21 12/18/21  Arrien, York Ram, MD  ivabradine (CORLANOR) 5 MG TABS tablet Take 1 tablet (5 mg total) by mouth 2 (two) times daily with a meal. 11/18/21   Arrien, York Ram, MD  Multiple Vitamin (MULTIVITAMIN WITH MINERALS) TABS tablet Take 1 tablet by mouth daily. 12/05/21   Reed, Tiffany L, DO      Allergies    Patient has no known allergies.    Review of Systems   Review of Systems Negative except as per HPI Physical Exam Updated Vital Signs BP (!) 128/99   Pulse (!) 102   Temp 97.8 F (36.6 C) (Oral)   Resp (!) 23   Ht 5\' 4"  (1.626 m)   Wt 76 kg   SpO2 95%   BMI 28.76 kg/m  Physical Exam Vitals and nursing note reviewed.  Constitutional:      General: She is not in acute distress.    Appearance: She is well-developed. She is not diaphoretic.   HENT:     Head: Normocephalic and atraumatic.     Mouth/Throat:     Mouth: Mucous membranes are dry.  Cardiovascular:     Rate and Rhythm: Normal rate and regular rhythm.     Heart sounds: Normal heart sounds.  Pulmonary:     Effort: Pulmonary effort is normal.     Breath sounds: Normal breath sounds.  Abdominal:     Palpations: Abdomen is soft.     Tenderness: There is no abdominal tenderness.  Musculoskeletal:     Cervical back: Neck supple.     Right lower leg: Edema present.     Left lower leg: Edema present.  Skin:    General: Skin is warm and dry.     Findings: No erythema.  Neurological:     Mental Status: She is alert and oriented to person, place, and time.  Psychiatric:        Behavior: Behavior normal.     ED Results / Procedures / Treatments   Labs (all labs ordered are listed, but only abnormal results are displayed) Labs Reviewed  CBC WITH DIFFERENTIAL/PLATELET - Abnormal; Notable for the following components:      Result Value   RDW 20.2 (*)    nRBC 0.9 (*)  All other components within normal limits  COMPREHENSIVE METABOLIC PANEL - Abnormal; Notable for the following components:   Glucose, Bld 115 (*)    Creatinine, Ser 1.53 (*)    Albumin 3.3 (*)    Total Bilirubin 2.0 (*)    GFR, Estimated 36 (*)    All other components within normal limits  BRAIN NATRIURETIC PEPTIDE - Abnormal; Notable for the following components:   B Natriuretic Peptide 3,743.2 (*)    All other components within normal limits  MAGNESIUM    EKG None  Radiology DG Chest Port 1 View  Result Date: 03/12/2022 CLINICAL DATA:  Leg swelling EXAM: PORTABLE CHEST 1 VIEW COMPARISON:  11/13/2021 FINDINGS: Cardiomegaly with vascular congestion and bilateral airspace disease compatible with edema/CHF. No visible effusions or acute bony abnormality. IMPRESSION: Mild to moderate CHF Electronically Signed   By: Rolm Baptise M.D.   On: 03/12/2022 19:24    Procedures Procedures     Medications Ordered in ED Medications  furosemide (LASIX) injection 40 mg (40 mg Intravenous Given 03/12/22 1919)    ED Course/ Medical Decision Making/ A&P                           Medical Decision Making Amount and/or Complexity of Data Reviewed Labs: ordered. Radiology: ordered.  Risk Prescription drug management. Decision regarding hospitalization.   This patient presents to the ED for concern of lower extremity swelling, this involves an extensive number of treatment options, and is a complaint that carries with it a high risk of complications and morbidity.  The differential diagnosis includes but not limited to CHF, medication non compliance, DVT   Co morbidities that complicate the patient evaluation  Cocaine abuse, CHF with EF <20%, HTN, CKD, a flutter   Additional history obtained:  Additional history obtained from EMS notes poor living conditions, unable to care for self and medically non compliant- assistance requested from [facility] External records from outside source obtained and reviewed including prior echo dated 11/15/21 EF <20%   Lab Tests:  I Ordered, and personally interpreted labs.  The pertinent results include: CBC without significant findings.  CMP with creatinine of 1.53, known CKD.  Magnesium within normal limits.  BNP is elevated at 3,743.2   Imaging Studies ordered:  I ordered imaging studies including chest x-ray I independently visualized and interpreted imaging which showed CHF I agree with the radiologist interpretation   Cardiac Monitoring: / EKG:  Pending at time of admission    Consultations Obtained:  I requested consultation with the hospitalist, Dr. Cyd Silence,  and discussed lab and imaging findings as well as pertinent plan - they recommend: admission    Problem List / ED Course / Critical interventions / Medication management  71 year old female presents with concern for lower extremity pain secondary to swelling in her  legs, thought to be related to her CHF.  Her BNP is elevated, her chest x-ray suggest CHF although her lungs are clear and she does not report chest pain or shortness of breath.  Patient was given IV Lasix with plan to admit for further diuresis. I ordered medication including Lasix for edema Reevaluation of the patient after these medicines showed that the patient stayed the same I have reviewed the patients home medicines and have made adjustments as needed   Social Determinants of Health:  Poor living condition, non compliant    Test / Admission - Considered:  Admit for further managment  Final Clinical Impression(s) / ED Diagnoses Final diagnoses:  Peripheral edema  Acute on chronic combined systolic and diastolic congestive heart failure [facility])    Rx / DC Orders ED Discharge Orders     None         Jeannie Fend, PA-C 03/12/22 2201    Loetta Rough, MD 03/16/22 [phone]

## 2022-03-12 NOTE — H&P (Signed)
History and Physical    Patient: Valerie Rodgers MRN: [id number] DOA: 03/12/2022  Date of Service: the patient was seen and examined on 03/13/2022  Patient coming from: Home via EMS  Chief Complaint:  Chief Complaint  Patient presents with   Leg Swelling   Weakness    HPI:   71 year old female with past medical history of systolic and diastolic congestive heart failure (Echo 10/2021 EF < 20% with G3DD) atrial flutter, COPD, hypertension, bipolar disorder, ongoing cocaine abuse, chronic kidney disease stage IIIb presenting to [facility] emergency department via EMS with complaints of bilateral lower extremity swelling and weakness.  Patient explains that for the past several days she has been experiencing progressively worsening shortness of breath.  Shortness of breath associated with intense generalized weakness and persisting bilateral lower extremity edema.  Shortness of breath and weakness are worse with any degree of exertion and only minimally improved with rest.  Patient denies associated chest pain.  Patient denies fevers, cough, sick contacts or contact with known COVID-19 infection.    Patient does endorse ongoing cocaine use.  Patient reports that she uses cocaine regularly with last use being earlier today.  Patient also admits to "a little bit" of regular alcohol use although she is willing to quantify how much.  Patient states that in the past several days her severe weakness and shortness of breath have been so severe that patient has been unable to get out of bed.  Patient's symptoms have continued to worsen to the point where where EMS has been contacted who brought the patient into [facility] emergency room for evaluation.  Upon evaluation in the emergency department patient is found to be short of breath with significant bilateral lower extremity pitting edema and chest x-ray consistent with substantial pulmonary edema.  BNP is found to be markedly  elevated.  40 mg of intravenous Lasix was administered and the hospitalist group is now been called to assess the patient for admission to the hospital.  Review of Systems: Review of Systems  Constitutional:  Positive for malaise/fatigue.  Respiratory:  Positive for shortness of breath.   Cardiovascular:  Positive for leg swelling.  Neurological:  Positive for weakness.  All other systems reviewed and are negative.    Past Medical History:  Diagnosis Date   Arthritis    Bipolar affective disorder ([place])    Cataract     Past Surgical History:  Procedure Laterality Date   ABDOMINAL HYSTERECTOMY      Social History:  reports that she has been smoking cigarettes. She has a 16.25 pack-year smoking history. She has never used smokeless tobacco. She reports current alcohol use. She reports current drug use. Drugs: Cocaine and Marijuana.  No Known Allergies  Family History  Problem Relation Age of Onset   Stroke Father    Colon cancer Neg Hx    Heart disease Neg Hx     Prior to Admission medications   Medication Sig Start Date End Date Taking? Authorizing Provider  atorvastatin (LIPITOR) 20 MG tablet Take 1 tablet (20 mg total) by mouth at bedtime. 11/18/21 12/18/21  Arrien, Jimmy Picket, MD  carvedilol (COREG) 3.125 MG tablet Take 1 tablet (3.125 mg total) by mouth 2 (two) times daily with a meal. 11/18/21 12/18/21  Arrien, Jimmy Picket, MD  ivabradine (CORLANOR) 5 MG TABS tablet Take 1 tablet (5 mg total) by mouth 2 (two) times daily with a meal. 11/18/21   Arrien, Jimmy Picket, MD  Multiple Vitamin (  MULTIVITAMIN WITH MINERALS) TABS tablet Take 1 tablet by mouth daily. 12/05/21   Gayland Curry, DO    Physical Exam:  Vitals:   03/12/22 1733 03/12/22 1751 03/12/22 1922 03/12/22 2245  BP:  120/89 (!) 128/99 (!) 138/96  Pulse:  98 (!) 102 97  Resp:   (!) 23 20  Temp: 97.8 F (36.6 C)     TempSrc: Oral     SpO2:  100% 95% 91%  Weight:      Height:        Constitutional:  Awake alert and oriented x3, is in respiratory distress. Skin: 2 cm circumferential ulceration noted on the left buttock with clean base with pink granulation tissue.  No discharge noted.  Patient additionally has other small subcentimeter ulcerations on the anterior surface of the left lower extremity.  Patient possesses a peau d'orange appearance of the skin of the bilateral lower extremities.   Eyes: Pupils are equally reactive to light.  No evidence of scleral icterus or conjunctival pallor.  ENMT: Dry mucous membranes noted.  Posterior pharynx clear of any exudate or lesions.   Neck: normal, supple, no masses, no thyromegaly.  No evidence of jugular venous distension.   Respiratory: clear to auscultation bilaterally, no wheezing, no crackles. Normal respiratory effort. No accessory muscle use.  Cardiovascular: Tachycardic rate with regular rhythm no murmurs / rubs / gallops.  Extensive bilateral lower extremity pitting edema that tracks to the thighs.  2+ pedal pulses. No carotid bruits.  Chest:   Nontender without crepitus or deformity.   Back:   Nontender without crepitus or deformity. Abdomen: Abdomen is soft and nontender.  No evidence of intra-abdominal masses.  Positive bowel sounds noted in all quadrants.   Musculoskeletal: No joint deformity upper and lower extremities. Good ROM, no contractures. Normal muscle tone.  Neurologic: CN 2-12 grossly intact. Sensation intact.  Patient moving all 4 extremities spontaneously.  Patient is following all commands.  Patient is responsive to verbal stimuli.   Psychiatric: Patient exhibits anxious mood with labile affect.  Patient seems to possess insight as to their current situation.    Data Reviewed:  I have personally reviewed and interpreted labs, imaging.  Significant findings are   Lab Results  Component Value Date   WBC 8.0 03/12/2022   HGB 15.3 (H) 03/13/2022   HCT 45.0 03/13/2022   MCV 82.9 03/12/2022   PLT 246 03/12/2022   Lab  Results  Component Value Date   K 4.5 03/13/2022   Lab Results  Component Value Date   BUN 23 03/12/2022   Lab Results  Component Value Date   CREATININE 1.53 (H) 03/12/2022    CXR:   Chest X-ray was personally reviewed.  [employer] bilateral diffuse patchy infiltrates consistent with acute pulmonary edema.  Cardiomegaly, possible left pleural effusion.     EKG: Personally reviewed.  Rhythm is sinus tachycardia with heart rate of 103 bpm.  No dynamic ST segment changes appreciated.    Assessment and Plan: Acute on chronic combined systolic and diastolic CHF (congestive heart failure) (HCC) Patient presenting with symptoms of dyspnea on exertion weakness and peripheral edema consistent with acute on chronic  congestive heart failure Work-up reveals elevated BNP and evidence of acute pulmonary edema on chest imaging This particular exacerbation is likely secondary to ongoing cocaine abuse and medication/dietary noncompliance  Overall prognosis is guarded Patient has been placed on intravenous diuretics with Lasix twice daily  strict input and output monitoring Supplemental oxygen associated hypoxia.  Obtaining  ABG and will initiate noninvasive positive pressure ventilation if indicated Monitoring renal function and electrolytes with serial chemistires Daily weights Monitoring patient on telemetry     COPD with acute exacerbation ([place]) Physical examination additionally suggestive of COPD exacerbation Patient is exhibiting prolonged aspiratory phase and expiratory wheezing on exam Patient has a known longstanding history of smoking and COPD Placing patient on aggressive bronchodilator therapy Intravenous systemic steroids initiated Obtaining ABG Supplemental oxygen and remainder of treatment as noted above  Acute cardiogenic pulmonary edema (HCC) Significant bilateral patchy infiltrates consistent with acute cardiogenic pulmonary edema on chest x-ray Secondary to acute systolic  and diastolic congestive heart failure Remainder of assessment and plan as above  Cocaine abuse ([place]) Ongoing cocaine abuse is likely playing a major role in the patient's repeated bouts of acute heart failure Counseling patient to urgently quit this habit as patient is very ill as a result  Atrial flutter, paroxysmal (HCC) Rate is mostly controlled at this time Continue rate controlling therapy with ivabradine and will resume home regimen of Coreg once it has been 24 hours or more since the patient's last dose of cocaine use Patient is not on anticoagulation per outpatient regimen and therefore will not be placed on anticoagulation here Monitoring on telemetry  Chronic kidney disease, stage 3b (HCC) Strict intake and output monitoring Creatinine near baseline Minimizing nephrotoxic agents as much as possible Serial chemistries to monitor renal function and electrolytes   Essential hypertension Mostly normotensive We will resume Coreg once over 24 hours out from last use of cocaine as noted above As needed intravenous antihypertensives for markedly elevated blood pressure  Mixed hyperlipidemia Continuing home regimen of lipid lowering therapy.   Nicotine dependence, cigarettes, uncomplicated Patient is being counseled daily on smoking cessation. Providing patient with nicotine replacement therapy during this hospitalization.    Decubitus ulcer of left buttock Present on admission Daily dressing changes Wound care consultation placed       Code Status:  Full code  code status decision has been confirmed with: patient Family Communication: deferred   Consults: Day team to consult cardiology [facility]) in AM.   Severity of Illness:  The appropriate patient status for this patient is INPATIENT. Inpatient status is judged to be reasonable and necessary in order to provide the required intensity of service to ensure the patient's safety. The patient's  presenting symptoms, physical exam findings, and initial radiographic and laboratory data in the context of their chronic comorbidities is felt to place them at high risk for further clinical deterioration. Furthermore, it is not anticipated that the patient will be medically stable for discharge from the hospital within 2 midnights of admission.   * I certify that at the point of admission it is my clinical judgment that the patient will require inpatient hospital care spanning beyond 2 midnights from the point of admission due to high intensity of service, high risk for further deterioration and high frequency of surveillance required.*  Author:  Vernelle Emerald MD  03/13/2022 12:38 AM
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
## 2022-03-13 DIAGNOSIS — J449 Chronic obstructive pulmonary disease, unspecified: Secondary | ICD-10-CM | POA: Diagnosis present

## 2022-03-13 DIAGNOSIS — I10 Essential (primary) hypertension: Secondary | ICD-10-CM | POA: Diagnosis not present

## 2022-03-13 DIAGNOSIS — E782 Mixed hyperlipidemia: Secondary | ICD-10-CM | POA: Diagnosis not present

## 2022-03-13 DIAGNOSIS — L89329 Pressure ulcer of left buttock, unspecified stage: Secondary | ICD-10-CM | POA: Diagnosis present

## 2022-03-13 DIAGNOSIS — J441 Chronic obstructive pulmonary disease with (acute) exacerbation: Secondary | ICD-10-CM | POA: Diagnosis present

## 2022-03-13 DIAGNOSIS — I5023 Acute on chronic systolic (congestive) heart failure: Secondary | ICD-10-CM | POA: Diagnosis not present

## 2022-03-13 LAB — I-STAT ARTERIAL BLOOD GAS, ED
Acid-base deficit: 1 mmol/L (ref 0.0–2.0)
Bicarbonate: 23.5 mmol/L (ref 20.0–28.0)
Calcium, Ion: 1.15 mmol/L (ref 1.15–1.40)
HCT: 45 % (ref 36.0–46.0)
Hemoglobin: 15.3 g/dL — ABNORMAL HIGH (ref 12.0–15.0)
O2 Saturation: 89 %
Patient temperature: 97.8
Potassium: 4.5 mmol/L (ref 3.5–5.1)
Sodium: 141 mmol/L (ref 135–145)
TCO2: 25 mmol/L (ref 22–32)
pCO2 arterial: 36.1 mmHg (ref 32–48)
pH, Arterial: 7.42 (ref 7.35–7.45)
pO2, Arterial: 54 mmHg — ABNORMAL LOW (ref 83–108)

## 2022-03-13 LAB — CBC WITH DIFFERENTIAL/PLATELET
Abs Immature Granulocytes: 0.03 10*3/uL (ref 0.00–0.07)
Basophils Absolute: 0 10*3/uL (ref 0.0–0.1)
Basophils Relative: 0 %
Eosinophils Absolute: 0 10*3/uL (ref 0.0–0.5)
Eosinophils Relative: 0 %
HCT: 41.5 % (ref 36.0–46.0)
Hemoglobin: 13.6 g/dL (ref 12.0–15.0)
Immature Granulocytes: 0 %
Lymphocytes Relative: 8 %
Lymphs Abs: 0.6 10*3/uL — ABNORMAL LOW (ref 0.7–4.0)
MCH: 27.4 pg (ref 26.0–34.0)
MCHC: 32.8 g/dL (ref 30.0–36.0)
MCV: 83.7 fL (ref 80.0–100.0)
Monocytes Absolute: 0.3 10*3/uL (ref 0.1–1.0)
Monocytes Relative: 5 %
Neutro Abs: 6.6 10*3/uL (ref 1.7–7.7)
Neutrophils Relative %: 87 %
Platelets: 197 10*3/uL (ref 150–400)
RBC: 4.96 MIL/uL (ref 3.87–5.11)
RDW: 19.9 % — ABNORMAL HIGH (ref 11.5–15.5)
WBC: 7.6 10*3/uL (ref 4.0–10.5)
nRBC: 1.2 % — ABNORMAL HIGH (ref 0.0–0.2)

## 2022-03-13 LAB — COMPREHENSIVE METABOLIC PANEL
ALT: 24 U/L (ref 0–44)
AST: 29 U/L (ref 15–41)
Albumin: 3.3 g/dL — ABNORMAL LOW (ref 3.5–5.0)
Alkaline Phosphatase: 101 U/L (ref 38–126)
Anion gap: 14 (ref 5–15)
BUN: 27 mg/dL — ABNORMAL HIGH (ref 8–23)
CO2: 25 mmol/L (ref 22–32)
Calcium: 9.1 mg/dL (ref 8.9–10.3)
Chloride: 104 mmol/L (ref 98–111)
Creatinine, Ser: 1.68 mg/dL — ABNORMAL HIGH (ref 0.44–1.00)
GFR, Estimated: 32 mL/min — ABNORMAL LOW (ref >60)
Glucose, Bld: 114 mg/dL — ABNORMAL HIGH (ref 70–99)
Potassium: 4.3 mmol/L (ref 3.5–5.1)
Sodium: 143 mmol/L (ref 135–145)
Total Bilirubin: 1.8 mg/dL — ABNORMAL HIGH (ref 0.3–1.2)
Total Protein: 6.8 g/dL (ref 6.5–8.1)

## 2022-03-13 LAB — MAGNESIUM: Magnesium: 2 mg/dL (ref 1.7–2.4)

## 2022-03-13 LAB — TROPONIN I (HIGH SENSITIVITY): Troponin I (High Sensitivity): 83 ng/L — ABNORMAL HIGH (ref <18)

## 2022-03-13 LAB — LACTIC ACID, PLASMA
Lactic Acid, Venous: 2.8 mmol/L (ref 0.5–1.9)
Lactic Acid, Venous: 2.9 mmol/L (ref 0.5–1.9)

## 2022-03-13 LAB — SARS CORONAVIRUS 2 BY RT PCR: SARS Coronavirus 2 by RT PCR: NEGATIVE

## 2022-03-13 MED ORDER — POLYETHYLENE GLYCOL 3350 17 G PO PACK: 17.0000 g | PACK | Freq: Every day | ORAL | Status: DC | PRN

## 2022-03-13 MED ORDER — ISOSORB DINITRATE-HYDRALAZINE 20-37.5 MG PO TABS: 1.0000 | ORAL_TABLET | Freq: Three times a day (TID) | ORAL | Status: DC

## 2022-03-13 MED ORDER — HYDRALAZINE HCL 20 MG/ML IJ SOLN: 10.0000 mg | Freq: Four times a day (QID) | INTRAMUSCULAR | Status: DC | PRN

## 2022-03-13 MED ORDER — IVABRADINE HCL 5 MG PO TABS
5.0000 mg | ORAL_TABLET | Freq: Two times a day (BID) | ORAL | Status: DC
  Administered 2022-03-13 – 2022-03-20 (×12): 5 mg via ORAL
  Filled 2022-03-13 (×16): qty 1

## 2022-03-13 MED ORDER — FUROSEMIDE 10 MG/ML IJ SOLN
20.0000 mg | Freq: Once | INTRAMUSCULAR | Status: AC
  Administered 2022-03-13: 20 mg via INTRAVENOUS
  Filled 2022-03-13: qty 2

## 2022-03-13 MED ORDER — ONDANSETRON HCL 4 MG/2ML IJ SOLN: 4.0000 mg | Freq: Four times a day (QID) | INTRAMUSCULAR | Status: DC | PRN

## 2022-03-13 MED ORDER — DAPAGLIFLOZIN PROPANEDIOL 10 MG PO TABS
10.0000 mg | ORAL_TABLET | Freq: Every day | ORAL | Status: DC
  Administered 2022-03-13 – 2022-03-20 (×8): 10 mg via ORAL
  Filled 2022-03-13 (×8): qty 1

## 2022-03-13 MED ORDER — ENOXAPARIN SODIUM 40 MG/0.4ML IJ SOSY
40.0000 mg | PREFILLED_SYRINGE | Freq: Every day | INTRAMUSCULAR | Status: DC
  Administered 2022-03-13 – 2022-03-20 (×8): 40 mg via SUBCUTANEOUS
  Filled 2022-03-13 (×8): qty 0.4

## 2022-03-13 MED ORDER — ADULT MULTIVITAMIN W/MINERALS CH
1.0000 | ORAL_TABLET | Freq: Every day | ORAL | Status: DC
  Administered 2022-03-14 – 2022-03-20 (×7): 1 via ORAL
  Filled 2022-03-13 (×7): qty 1

## 2022-03-13 MED ORDER — CARVEDILOL 3.125 MG PO TABS
3.1250 mg | ORAL_TABLET | Freq: Two times a day (BID) | ORAL | Status: DC
  Administered 2022-03-13 – 2022-03-14 (×2): 3.125 mg via ORAL
  Filled 2022-03-13 (×2): qty 1

## 2022-03-13 MED ORDER — ATORVASTATIN CALCIUM 10 MG PO TABS
20.0000 mg | ORAL_TABLET | Freq: Every day | ORAL | Status: DC
  Administered 2022-03-13 – 2022-03-19 (×8): 20 mg via ORAL
  Filled 2022-03-13 (×8): qty 2

## 2022-03-13 MED ORDER — FUROSEMIDE 10 MG/ML IJ SOLN
40.0000 mg | Freq: Two times a day (BID) | INTRAMUSCULAR | Status: DC
  Administered 2022-03-13: 40 mg via INTRAVENOUS
  Filled 2022-03-13: qty 4

## 2022-03-13 MED ORDER — ISOSORB DINITRATE-HYDRALAZINE 20-37.5 MG PO TABS
1.0000 | ORAL_TABLET | Freq: Two times a day (BID) | ORAL | Status: DC
  Administered 2022-03-13 (×2): 1 via ORAL
  Filled 2022-03-13 (×2): qty 1

## 2022-03-13 MED ORDER — ACETAMINOPHEN 650 MG RE SUPP: 650.0000 mg | Freq: Four times a day (QID) | RECTAL | Status: DC | PRN

## 2022-03-13 MED ORDER — ONDANSETRON HCL 4 MG PO TABS: 4.0000 mg | ORAL_TABLET | Freq: Four times a day (QID) | ORAL | Status: DC | PRN

## 2022-03-13 MED ORDER — ACETAMINOPHEN 325 MG PO TABS
650.0000 mg | ORAL_TABLET | Freq: Four times a day (QID) | ORAL | Status: DC | PRN
  Administered 2022-03-14 – 2022-03-19 (×6): 650 mg via ORAL
  Filled 2022-03-13 (×7): qty 2

## 2022-03-13 MED ORDER — FUROSEMIDE 10 MG/ML IJ SOLN
60.0000 mg | Freq: Two times a day (BID) | INTRAMUSCULAR | Status: DC
  Administered 2022-03-14 – 2022-03-17 (×7): 60 mg via INTRAVENOUS
  Filled 2022-03-13 (×8): qty 6

## 2022-03-13 NOTE — Assessment & Plan Note (Deleted)
.   Patient presenting with symptoms of dyspnea on exertion weakness and peripheral edema consistent with acute on chronic  congestive heart failure . Work-up reveals elevated BNP and evidence of acute pulmonary edema on chest imaging . This particular exacerbation is likely secondary to ongoing cocaine abuse and medication/dietary noncompliance  . Overall prognosis is guarded . Patient has been placed on intravenous diuretics with Lasix twice daily  . strict input and output monitoring . Supplemental oxygen associated hypoxia.  Obtaining ABG and will initiate noninvasive positive pressure ventilation if indicated . Monitoring renal function and electrolytes with serial chemistires . Daily weights . Monitoring patient on telemetry

## 2022-03-13 NOTE — Progress Notes (Signed)
Heart Failure Navigator Progress Note  Assessed for Heart & Vascular TOC clinic readiness.  Patient does not meet criteria due to [facility] cardiology patient.     Boneta Standre, BSN, RN Heart Failure Nurse Navigator Secure Chat Only   

## 2022-03-13 NOTE — Assessment & Plan Note (Deleted)
   Physical examination additionally suggestive of COPD exacerbation  Patient is exhibiting prolonged aspiratory phase and expiratory wheezing on exam  Patient has a known longstanding history of smoking and COPD  Placing patient on aggressive bronchodilator therapy  Intravenous systemic steroids initiated  Obtaining ABG  Supplemental oxygen and remainder of treatment as noted above

## 2022-03-13 NOTE — Assessment & Plan Note (Addendum)
Echocardiogram with reduced LV systolic function with EF <20% with global hypokinesis, RV systolic function with severe reduction, RV moderate enlargement, RVSP 47.9 mmHg. Severe dilatation of LA, moderate dilatation of RA, moderate MR. Moderate to severe TR.   Urine output is not documented, but patient clinically continue with volume overload.   Plan to continue diuresis with furosemide, increase dose to 60 mg bid. On Ivabradine  Add SGLT 2 inh to augment diuresis and resume Bidil and carvedilol.   Acute on chronic hypoxemic respiratory failure, cardiogenic pulmonary edema (acute). Continue diuresis, continue supplemental -02 per [place] to keep 02 saturation 92% or greater.

## 2022-03-13 NOTE — Assessment & Plan Note (Signed)
.   Patient is being counseled daily on smoking cessation. . Providing patient with nicotine replacement therapy during this hospitalization.   

## 2022-03-13 NOTE — Assessment & Plan Note (Signed)
Patient with no wheezing or increase sputum production COPD exacerbation has been ruled out  Plan to continue with bronchodilator  Discontinue systemic corticosteroids Continue supplemental 02 per [place] to keep 02 saturation 92% or greater.

## 2022-03-13 NOTE — Progress Notes (Signed)
Progress Note   Patient: Valerie Rodgers IOX:[id number] DOB: 09-08-1950 DOA: 03/12/2022     1 DOS: the patient was seen and examined on 03/13/2022   Brief hospital course: Mrs. Madaris was admitted to the hospital with the working diagnosis of decompensated heart failure.   71 yo female with the past medical history of heart failure, atrial fibrillation, COPD, hypertension, CKD stage 3b, and polysubstance abuse who presented with edema and weakness. Reported several days of worsening dyspnea, associated with lower extremity edema. Patient developed worsening generalized weakness and not able to get out of bed. Continue to use cocaine regularly including the day of admission. Because worsening symptoms EMS was called and patient was brought to the hospital. On her initial physical examination her blood pressure was 1128/99, HR 98, RR 23 and 02 saturation 91%, lungs with no wheezing or rhonchi, heart with S1 and S2 present with no gallops or murmurs, abdomen with no distention and positive lower extremity edema.   Na 142, K 4,7 CL 104 bicarbonate at 23 glucose 115 bun 23 and cr 1,53 BNP 3,743 High sensitive troponin 83  Lactic acid 2,8  Wbc 8,0 hgb 13,8 plt 246  Sars covid 19 negative   Chest radiograph with cardiomegaly, bilateral symmetric interstitial infiltrates with bilateral hilar vascular congestion   EKG 103 bpm, normal axis, first degree AV block, sinus rhythm with low voltage, PAC,  with no significant ST segment or T wave changes.    Assessment and Plan: * Acute on chronic systolic CHF (congestive heart failure) (HCC) Echocardiogram with reduced LV systolic function with EF <20% with global hypokinesis, RV systolic function with severe reduction, RV moderate enlargement, RVSP 47.9 mmHg. Severe dilatation of LA, moderate dilatation of RA, moderate MR. Moderate to severe TR.   Urine output is not documented, but patient clinically continue with volume overload.   Plan to continue  diuresis with furosemide, increase dose to 60 mg bid. On Ivabradine  Add SGLT 2 inh to augment diuresis and resume Bidil and carvedilol.   Acute on chronic hypoxemic respiratory failure, cardiogenic pulmonary edema (acute). Continue diuresis, continue supplemental -02 per [place] to keep 02 saturation 92% or greater.   COPD (chronic obstructive pulmonary disease) ([place]) Patient with no wheezing or increase sputum production COPD exacerbation has been ruled out  Plan to continue with bronchodilator  Discontinue systemic corticosteroids Continue supplemental 02 per [place] to keep 02 saturation 92% or greater.   Atrial flutter, paroxysmal ([place]) Patient with ivabradine and resume carvedilol for further better rate control.  Patient not anticoagulated, she has declined anticoagulation in the past. Considered high risk for bleeding due to alcohol and cocaine use.    Chronic kidney disease, stage 3b ([place]) Renal function with serum cr at 1,68, K 4,3 and serum bicarbonate aet 25. Plan to continue diuresis with furosemide for volume overload, follow up renal function in am. Avoid hypotension and nephrotoxic medications.   Mixed hyperlipidemia Continuing home regimen of lipid lowering therapy.   Essential hypertension Continue diuresis and will resume Bidil.   Decubitus ulcer of left buttock Present on admission Daily dressing changes Wound care consultation placed  Nicotine dependence, cigarettes, uncomplicated Patient is being counseled daily on smoking cessation. Providing patient with nicotine replacement therapy during this hospitalization.    Cocaine abuse ([place]) Ongoing cocaine abuse is likely playing a major role in the patient's repeated bouts of acute heart failure Counseling patient to urgently quit this habit as patient is very ill as a result  Subjective: Patient with no chest pain, dyspnea has improved but not back to baseline,  Physical Exam: Vitals:   03/13/22  0529 03/13/22 0812 03/13/22 0837 03/13/22 1218  BP: 108/71 (!) 127/101  122/80  Pulse: 98 97 98 94  Resp: (!) 24 18 16 19   Temp: 98.6 F (37 C) 97.9 F (36.6 C)  97.8 F (36.6 C)  TempSrc: Oral Oral  Oral  SpO2: 96% 99% 96% 97%  Weight:      Height:        Neurology awake and alert, deconditioned and ill looking appearing ENT with mild pallor Cardiovascular with S1 and S2 present and regular with extra beats, no gallops, positive systolic murmur at the apex Respiratory with bilateral rales, with no wheezing.  Abdomen with no distention Positive lower extremity edema ++  Data Reviewed:    Family Communication: no family at the bedside   Disposition: Status is: Inpatient Remains inpatient appropriate because: heart failure decompensation   Planned Discharge Destination: Home     Author: Tawni Millers, MD 03/13/2022 2:13 PM  For on call review [email or web address].

## 2022-03-13 NOTE — Assessment & Plan Note (Addendum)
Patient with ivabradine and resume carvedilol for further better rate control.  Patient not anticoagulated, she has declined anticoagulation in the past. Considered high risk for bleeding due to alcohol and cocaine use.

## 2022-03-13 NOTE — Assessment & Plan Note (Signed)
Renal function with serum cr at 1,68, K 4,3 and serum bicarbonate aet 25. Plan to continue diuresis with furosemide for volume overload, follow up renal function in am. Avoid hypotension and nephrotoxic medications.

## 2022-03-13 NOTE — Assessment & Plan Note (Deleted)
Strict intake and output monitoring Creatinine near baseline Minimizing nephrotoxic agents as much as possible Serial chemistries to monitor renal function and electrolytes  

## 2022-03-13 NOTE — Consult Note (Signed)
WOC Nurse Consult Note: Reason for Consult: Consult requested for left lower buttock Wound type: Stage 2 pressure injury to left lower buttock, 100% red and moist, 2.2X2X.1cm Pressure Injury POA: Yes; noted as present on admission on nursing flow sheet by the bedside nurse Dressing procedure/placement/frequency: Topical treatment orders provided for bedside nurses to perform as follows to protect and promote healing: Foam dressing to left lower buttock, change Q 3 days or PRN soiling. Please re-consult if further assistance is needed.  Thank-you,  Julien Girt MSN, [place], [place], [place], [place]

## 2022-03-13 NOTE — Assessment & Plan Note (Addendum)
Hold on Bidil, and continue diuresis.  Hold on carvedilol.

## 2022-03-13 NOTE — Assessment & Plan Note (Signed)
   Ongoing cocaine abuse is likely playing a major role in the patient's repeated bouts of acute heart failure  Counseling patient to urgently quit this habit as patient is very ill as a result

## 2022-03-13 NOTE — Assessment & Plan Note (Signed)
   Significant bilateral patchy infiltrates consistent with acute cardiogenic pulmonary edema on chest x-ray  Secondary to acute systolic and diastolic congestive heart failure  Remainder of assessment and plan as above

## 2022-03-13 NOTE — Hospital Course (Signed)
Mrs. Hemsley was admitted to the hospital with the working diagnosis of decompensated heart failure.   71 yo female with the past medical history of heart failure, atrial fibrillation, COPD, hypertension, CKD stage 3b, and polysubstance abuse who presented with edema and weakness. Reported several days of worsening dyspnea, associated with lower extremity edema. Patient developed worsening generalized weakness and not able to get out of bed. Continue to use cocaine regularly including the day of admission. Because worsening symptoms EMS was called and patient was brought to the hospital. On her initial physical examination her blood pressure was 1128/99, HR 98, RR 23 and 02 saturation 91%, lungs with no wheezing or rhonchi, heart with S1 and S2 present with no gallops or murmurs, abdomen with no distention and positive lower extremity edema.   Na 142, K 4,7 CL 104 bicarbonate at 23 glucose 115 bun 23 and cr 1,53 BNP 3,743 High sensitive troponin 83  Lactic acid 2,8  Wbc 8,0 hgb 13,8 plt 246  Sars covid 19 negative   Chest radiograph with cardiomegaly, bilateral symmetric interstitial infiltrates with bilateral hilar vascular congestion   EKG 103 bpm, normal axis, first degree AV block, sinus rhythm with low voltage, PAC,  with no significant ST segment or T wave changes.   Patient has been placed on furosemide for diuresis Her blood pressure has been low.

## 2022-03-13 NOTE — Assessment & Plan Note (Signed)
.   Continuing home regimen of lipid lowering therapy.  

## 2022-03-13 NOTE — Assessment & Plan Note (Signed)
   Present on admission  Daily dressing changes  Wound care consultation placed

## 2022-03-14 DIAGNOSIS — N179 Acute kidney failure, unspecified: Secondary | ICD-10-CM | POA: Diagnosis not present

## 2022-03-14 DIAGNOSIS — E782 Mixed hyperlipidemia: Secondary | ICD-10-CM | POA: Diagnosis not present

## 2022-03-14 DIAGNOSIS — I5023 Acute on chronic systolic (congestive) heart failure: Secondary | ICD-10-CM | POA: Diagnosis not present

## 2022-03-14 DIAGNOSIS — N189 Chronic kidney disease, unspecified: Secondary | ICD-10-CM

## 2022-03-14 DIAGNOSIS — J449 Chronic obstructive pulmonary disease, unspecified: Secondary | ICD-10-CM | POA: Diagnosis not present

## 2022-03-14 LAB — BASIC METABOLIC PANEL
Anion gap: 6 (ref 5–15)
BUN: 35 mg/dL — ABNORMAL HIGH (ref 8–23)
CO2: 30 mmol/L (ref 22–32)
Calcium: 8.3 mg/dL — ABNORMAL LOW (ref 8.9–10.3)
Chloride: 106 mmol/L (ref 98–111)
Creatinine, Ser: 1.44 mg/dL — ABNORMAL HIGH (ref 0.44–1.00)
GFR, Estimated: 39 mL/min — ABNORMAL LOW (ref >60)
Glucose, Bld: 178 mg/dL — ABNORMAL HIGH (ref 70–99)
Potassium: 4 mmol/L (ref 3.5–5.1)
Sodium: 142 mmol/L (ref 135–145)

## 2022-03-14 LAB — MAGNESIUM: Magnesium: 1.8 mg/dL (ref 1.7–2.4)

## 2022-03-14 MED ORDER — MAGNESIUM SULFATE 2 GM/50ML IV SOLN
2.0000 g | Freq: Once | INTRAVENOUS | Status: AC
  Administered 2022-03-14: 2 g via INTRAVENOUS
  Filled 2022-03-14: qty 50

## 2022-03-14 MED ORDER — POLYETHYLENE GLYCOL 3350 17 G PO PACK
17.0000 g | PACK | Freq: Two times a day (BID) | ORAL | Status: DC
  Administered 2022-03-15 – 2022-03-20 (×6): 17 g via ORAL
  Filled 2022-03-14 (×9): qty 1

## 2022-03-14 MED ORDER — BISACODYL 5 MG PO TBEC
5.0000 mg | DELAYED_RELEASE_TABLET | Freq: Once | ORAL | Status: AC
  Administered 2022-03-14: 5 mg via ORAL
  Filled 2022-03-14: qty 1

## 2022-03-14 MED ORDER — MIDODRINE HCL 5 MG PO TABS
5.0000 mg | ORAL_TABLET | Freq: Three times a day (TID) | ORAL | Status: DC
  Administered 2022-03-14 – 2022-03-16 (×5): 5 mg via ORAL
  Filled 2022-03-14 (×5): qty 1

## 2022-03-14 NOTE — Progress Notes (Signed)
Mobility Specialist - Progress Note   03/14/22 1247  Mobility  Activity Transferred from bed to chair  Level of Assistance Minimal assist, patient does 75% or more  Assistive Device Front wheel walker  Activity Response Tolerated well  $Mobility charge 1 Mobility   Pt was received in bed and agreeable to transfer. No complaint throughout transfer.Pt was left in chair with all needs met and chair alarm on.  Larey Seat

## 2022-03-14 NOTE — Progress Notes (Signed)
   03/14/22 1236  Assess: MEWS Score  Temp 97.9 F (36.6 C)  BP (!) 96/58  MAP (mmHg) 69  Pulse Rate 80  ECG Heart Rate 82  Resp (!) 22  SpO2 93 %  O2 Device Nasal Cannula  Assess: MEWS Score  MEWS Temp 0  MEWS Systolic 1  MEWS Pulse 0  MEWS RR 1  MEWS LOC 0  MEWS Score 2  MEWS Score Color Yellow  Assess: if the MEWS score is Yellow or Red  Were vital signs taken at a resting state? Yes  Focused Assessment No change from prior assessment  Does the patient meet 2 or more of the SIRS criteria? Yes  Does the patient have a confirmed or suspected source of infection? No  MEWS guidelines implemented *See Row Information* Yes  Take Vital Signs  Increase Vital Sign Frequency  Yellow: Q 2hr X 2 then Q 4hr X 2, if remains yellow, continue Q 4hrs  Escalate  MEWS: Escalate Yellow: discuss with charge nurse/RN and consider discussing with provider and RRT  Notify: Charge Nurse/RN  Name of Charge Nurse/RN Notified Linda, RN  Date Charge Nurse/RN Notified 03/14/22  Time Charge Nurse/RN Notified 1253  Document  Patient Outcome Other (Comment) (stable on floor)  Progress note created (see row info) Yes  Assess: SIRS CRITERIA  SIRS Temperature  0  SIRS Pulse 0  SIRS Respirations  1  SIRS WBC 1  SIRS Score Sum  2

## 2022-03-14 NOTE — Progress Notes (Signed)
Progress Note   Patient: Valerie Rodgers DGU:[id number] DOB: Jun 02, 1951 DOA: 03/12/2022     2 DOS: the patient was seen and examined on 03/14/2022   Brief hospital course: Valerie Rodgers was admitted to the hospital with the working diagnosis of decompensated heart failure.   71 yo female with the past medical history of heart failure, atrial fibrillation, COPD, hypertension, CKD stage 3b, and polysubstance abuse who presented with edema and weakness. Reported several days of worsening dyspnea, associated with lower extremity edema. Patient developed worsening generalized weakness and not able to get out of bed. Continue to use cocaine regularly including the day of admission. Because worsening symptoms EMS was called and patient was brought to the hospital. On her initial physical examination her blood pressure was 1128/99, HR 98, RR 23 and 02 saturation 91%, lungs with no wheezing or rhonchi, heart with S1 and S2 present with no gallops or murmurs, abdomen with no distention and positive lower extremity edema.   Na 142, K 4,7 CL 104 bicarbonate at 23 glucose 115 bun 23 and cr 1,53 BNP 3,743 High sensitive troponin 83  Lactic acid 2,8  Wbc 8,0 hgb 13,8 plt 246  Sars covid 19 negative   Chest radiograph with cardiomegaly, bilateral symmetric interstitial infiltrates with bilateral hilar vascular congestion   EKG 103 bpm, normal axis, first degree AV block, sinus rhythm with low voltage, PAC,  with no significant ST segment or T wave changes.   Patient has been placed on furosemide for diuresis Her blood pressure has been low.    Assessment and Plan: * Acute on chronic systolic CHF (congestive heart failure) (HCC) Echocardiogram with reduced LV systolic function with EF <20% with global hypokinesis, RV systolic function with severe reduction, RV moderate enlargement, RVSP 47.9 mmHg. Severe dilatation of LA, moderate dilatation of RA, moderate MR. Moderate to severe TR.   Urine output 956 cc   Systolic blood pressure 94 to 105 mmHg.   Plan to hold on carvedilol and add midodrine for blood pressure support Hold on bidil due to hypotension Continue diuresis with furosemide and empagliflozin    Acute on chronic hypoxemic respiratory failure, cardiogenic pulmonary edema (acute). 02 saturation 96 % on 3 L.min per [place].  Continue diuresis, continue supplemental -02 per [place] to keep 02 saturation 92% or greater.   COPD (chronic obstructive pulmonary disease) ([place]) Patient with no wheezing or increase sputum production COPD exacerbation has been ruled out  Plan to continue with bronchodilator  Continue supplemental 02 per [place] to keep 02 saturation 92% or greater.   Atrial flutter, paroxysmal ([place]) Continue with ivabradine  Hold on carvedilol due to risk of hypotension.  Patient not anticoagulated, she has declined anticoagulation in the past. Considered high risk for bleeding due to alcohol and cocaine use.    Acute kidney injury superimposed on chronic kidney disease ([place]) CKD stage 3b hypomagnesemia   Renal function with serum cr at 1,44 with K at 4,0 and serum bicarbonate at 30. Add 2 g mag sulfate today  Plan to continue diuresis with furosemide  Follow up renal function in am, avoid hypotension and nephrotoxic medications.   Mixed hyperlipidemia Continuing home regimen of lipid lowering therapy.   Essential hypertension Hold on Bidil, and continue diuresis.  Hold on carvedilol.   Decubitus ulcer of left buttock Present on admission Daily dressing changes Wound care consultation placed  Nicotine dependence, cigarettes, uncomplicated Patient is being counseled daily on smoking cessation. Providing patient with nicotine replacement therapy during this  hospitalization.    Cocaine abuse ([place]) Ongoing cocaine abuse is likely playing a major role in the patient's repeated bouts of acute heart failure Counseling patient to urgently quit this habit as patient is very  ill as a result        Subjective: Patient with improvement in dyspnea, no chest pain, lower extremity edema have been improving   Physical Exam: Vitals:   03/14/22 1236 03/14/22 1300 03/14/22 1330 03/14/22 1400  BP: (!) 96/58 (!) 94/55 96/64 105/77  Pulse: 80     Resp: (!) 22     Temp: 97.9 F (36.6 C)     TempSrc: Oral     SpO2: 93%     Weight:      Height:       Neurology awake and alert ENT with no pallor Cardiovascular with S1 and S2 present, irregularly irregular with no gallops or murmurs Respiratory with scattered rales with no wheezing Abdomen with no distention  Positive lower extremity edema + to ++  Data Reviewed:    Family Communication: no family at the bedside   Disposition: Status is: Inpatient Remains inpatient appropriate because: heart failure   Planned Discharge Destination: Home  Author: Tawni Millers, MD 03/14/2022 2:43 PM  For on call review [email or web address].

## 2022-03-14 NOTE — Progress Notes (Signed)
CSW received consult for patient. CSW spoke with patient at bedside. CSW offered patient outpatient substance use treatment services resources. Patient accepted. All questions answered. No further questions reported at this time.

## 2022-03-14 NOTE — Progress Notes (Signed)
Care assumed for pt at this time. Handout received from [employer]. Pt is sleeping but arouses to voice. BP low. MD notified

## 2022-03-15 DIAGNOSIS — I5023 Acute on chronic systolic (congestive) heart failure: Secondary | ICD-10-CM | POA: Diagnosis not present

## 2022-03-15 LAB — BASIC METABOLIC PANEL
Anion gap: 8 (ref 5–15)
BUN: 37 mg/dL — ABNORMAL HIGH (ref 8–23)
CO2: 31 mmol/L (ref 22–32)
Calcium: 8.5 mg/dL — ABNORMAL LOW (ref 8.9–10.3)
Chloride: 104 mmol/L (ref 98–111)
Creatinine, Ser: 1.42 mg/dL — ABNORMAL HIGH (ref 0.44–1.00)
GFR, Estimated: 39 mL/min — ABNORMAL LOW (ref >60)
Glucose, Bld: 117 mg/dL — ABNORMAL HIGH (ref 70–99)
Potassium: 3.6 mmol/L (ref 3.5–5.1)
Sodium: 143 mmol/L (ref 135–145)

## 2022-03-15 LAB — MAGNESIUM: Magnesium: 2.1 mg/dL (ref 1.7–2.4)

## 2022-03-15 MED ORDER — POTASSIUM CHLORIDE CRYS ER 20 MEQ PO TBCR
40.0000 meq | EXTENDED_RELEASE_TABLET | Freq: Once | ORAL | Status: AC
  Administered 2022-03-15: 40 meq via ORAL
  Filled 2022-03-15: qty 2

## 2022-03-15 NOTE — Progress Notes (Signed)
PROGRESS NOTE    Valerie Rodgers  OMV:[id number] DOB: 03-28-1951 DOA: 03/12/2022 PCP: Pcp, No  71 yo female with the past medical history of heart failure, atrial fibrillation, COPD, hypertension, CKD stage 3b, and polysubstance abuse, cocaine abuse who presented with edema and weakness. -In the ED labs noted creatinine of 1.5, BNP 3743, troponin 83, chest x-ray noted cardiomegaly, bilateral interstitial infiltrates and vascular congestion -Started on diuretics, limited by soft BP   Subjective: -Feels tired, breathing starting to improve  Assessment and Plan:  Acute on chronic systolic CHF ) -Echo  with reduced LV systolic function with EF <20% with global hypokinesis, RV systolic function with severe reduction, RV moderate enlargement, moderate MR. Moderate to severe TR.  -Complicated by longstanding cocaine abuse -1.2 L negative, urine output inaccurate -Blood pressure has been soft, started on midodrine yesterday, carvedilol, BiDil on hold -Continue IV Lasix and empagliflozin -Followed by palliative care as well  COPD (chronic obstructive pulmonary disease) (HCC) -Continue bronchodilator therapy, supplemental O2  Wandering pacemaker Continue with ivabradine  -Carvedilol on hold, in sinus rhythm  Acute kidney injury on CKD stage 3b  hypomagnesemia  -Creatinine stable, now 1.42, avoid hypotension, monitor with diuresis  Essential hypertension -Meds as noted above  Decubitus ulcer of left buttock Chronic leg wounds -Present on admission, wound care following, continue dressing changes  Nicotine dependence, cigarettes, uncomplicated -Counseled  Cocaine abuse ([place]) Ongoing cocaine abuse is likely playing a major role in the patient's repeated bouts of acute heart failure Counseled at length again -TOC consult  DVT prophylaxis: Lovenox Code Status: Full code Family Communication: Discussed with patient in detail, no family at bedside Disposition Plan: Home likely 2 to  3 days  Consultants:    Procedures:   Antimicrobials:    Objective: Vitals:   03/14/22 1954 03/14/22 2346 03/15/22 0445 03/15/22 0834  BP: 111/61 (!) 100/55 92/77 120/69  Pulse: 66 71  66  Resp: 17 18 20 20   Temp: 98.4 F (36.9 C) 98.3 F (36.8 C) 98.2 F (36.8 C) (!) 97.3 F (36.3 C)  TempSrc: Oral Oral Oral Oral  SpO2: 90% 92% 93% (!) 89%  Weight:   90 kg   Height:        Intake/Output Summary (Last 24 hours) at 03/15/2022 1202 Last data filed at 03/14/2022 2024 Gross per 24 hour  Intake 50 ml  Output 600 ml  Net -550 ml   Filed Weights   03/12/22 1732 03/14/22 0335 03/15/22 0445  Weight: 76 kg 86.1 kg 90 kg    Examination:  General exam: Chronically ill disheveled female laying in bed, AAO x3 HEENT: Positive JVD CVS: S1-S2, irregularly irregular rhythm Lungs: Decreased breath sounds the bases, few Rales Abdomen: Soft, nontender, bowel sounds present Extremities: 1+ edema with wounds on both legs  Skin: As above  Psychiatry:  Mood & affect appropriate.     Data Reviewed:   CBC: Recent Labs  Lab 03/12/22 1754 03/13/22 0010 03/13/22 0504  WBC 8.0  --  7.6  NEUTROABS 6.6  --  6.6  HGB 13.8 15.3* 13.6  HCT 42.2 45.0 41.5  MCV 82.9  --  83.7  PLT 246  --  628   Basic Metabolic Panel: Recent Labs  Lab 03/12/22 1754 03/13/22 0010 03/13/22 0504 03/14/22 0232 03/15/22 0321  NA 142 141 143 142 143  K 4.7 4.5 4.3 4.0 3.6  CL 104  --  104 106 104  CO2 23  --  25 30 31   GLUCOSE  115*  --  114* 178* 117*  BUN 23  --  27* 35* 37*  CREATININE 1.53*  --  1.68* 1.44* 1.42*  CALCIUM 9.0  --  9.1 8.3* 8.5*  MG 1.9  --  2.0 1.8 2.1   GFR: Estimated Creatinine Clearance: 38.9 mL/min (A) (by C-G formula based on SCr of 1.42 mg/dL (H)). Liver Function Tests: Recent Labs  Lab 03/12/22 1754 03/13/22 0504  AST 33 29  ALT 25 24  ALKPHOS 107 101  BILITOT 2.0* 1.8*  PROT 6.9 6.8  ALBUMIN 3.3* 3.3*   No results for input(s): "LIPASE", "AMYLASE" in  the last 168 hours. No results for input(s): "AMMONIA" in the last 168 hours. Coagulation Profile: No results for input(s): "INR", "PROTIME" in the last 168 hours. Cardiac Enzymes: No results for input(s): "CKTOTAL", "CKMB", "CKMBINDEX", "TROPONINI" in the last 168 hours. BNP (last 3 results) No results for input(s): "PROBNP" in the last 8760 hours. HbA1C: No results for input(s): "HGBA1C" in the last 72 hours. CBG: No results for input(s): "GLUCAP" in the last 168 hours. Lipid Profile: No results for input(s): "CHOL", "HDL", "LDLCALC", "TRIG", "CHOLHDL", "LDLDIRECT" in the last 72 hours. Thyroid Function Tests: No results for input(s): "TSH", "T4TOTAL", "FREET4", "T3FREE", "THYROIDAB" in the last 72 hours. Anemia Panel: No results for input(s): "VITAMINB12", "FOLATE", "FERRITIN", "TIBC", "IRON", "RETICCTPCT" in the last 72 hours. Urine analysis:    Component Value Date/Time   COLORURINE STRAW (A) 11/14/2021 0458   APPEARANCEUR CLEAR 11/14/2021 0458   LABSPEC 1.006 11/14/2021 0458   PHURINE 5.0 11/14/2021 0458   GLUCOSEU NEGATIVE 11/14/2021 0458   HGBUR NEGATIVE 11/14/2021 0458   BILIRUBINUR NEGATIVE 11/14/2021 0458   KETONESUR NEGATIVE 11/14/2021 0458   PROTEINUR NEGATIVE 11/14/2021 0458   NITRITE NEGATIVE 11/14/2021 0458   LEUKOCYTESUR NEGATIVE 11/14/2021 0458   Sepsis Labs: @LABRCNTIP (procalcitonin:4,lacticidven:4)  ) Recent Results (from the past 240 hour(s))  SARS Coronavirus 2 by RT PCR (hospital order, performed in [facility] hospital lab) *cepheid single result test* Anterior Nasal Swab     Status: None   Collection Time: 03/12/22 11:40 PM   Specimen: Anterior Nasal Swab  Result Value Ref Range Status   SARS Coronavirus 2 by RT PCR NEGATIVE NEGATIVE Final    Comment: (NOTE) SARS-CoV-2 target nucleic acids are NOT DETECTED.  The SARS-CoV-2 RNA is generally detectable in upper and lower respiratory specimens during the acute phase of infection. The  lowest concentration of SARS-CoV-2 viral copies this assay can detect is 250 copies / mL. A negative result does not preclude SARS-CoV-2 infection and should not be used as the sole basis for treatment or other patient management decisions.  A negative result may occur with improper specimen collection / handling, submission of specimen other than nasopharyngeal swab, presence of viral mutation(s) within the areas targeted by this assay, and inadequate number of viral copies (<250 copies / mL). A negative result must be combined with clinical observations, patient history, and epidemiological information.  Fact Sheet for Patients:   https://www.patel.info/  Fact Sheet for Healthcare Providers: https://hall.com/  This test is not yet approved or  cleared by the [place] FDA and has been authorized for detection and/or diagnosis of SARS-CoV-2 by FDA under an Emergency Use Authorization (EUA).  This EUA will remain in effect (meaning this test can be used) for the duration of the COVID-19 declaration under Section 564(b)(1) of the Act, 21 U.S.C. section 360bbb-3(b)(1), unless the authorization is terminated or revoked sooner.  Performed at [facility]  Hospital Lab, [place] [address], [place], [place] [address]      Radiology Studies: No results found.   Scheduled Meds:  atorvastatin  20 mg Oral QHS   dapagliflozin propanediol  10 mg Oral Daily   enoxaparin (LOVENOX) injection  40 mg Subcutaneous Daily   furosemide  60 mg Intravenous BID   ivabradine  5 mg Oral BID WC   midodrine  5 mg Oral TID WC   multivitamin with minerals  1 tablet Oral Daily   nicotine  21 mg Transdermal Daily   polyethylene glycol  17 g Oral BID   potassium chloride  40 mEq Oral Once   Continuous Infusions:   LOS: 3 days    Time spent: 28min   Domenic Polite, MD [facility]   03/15/2022, 12:02 PM

## 2022-03-15 NOTE — Plan of Care (Signed)

## 2022-03-15 NOTE — Progress Notes (Signed)
Mobility Specialist - Progress Note   03/15/22 1426  Mobility  Activity Contraindicated/medical hold   Pt on hold d/t being lethargic. RN advised.  Larey Seat

## 2022-03-16 DIAGNOSIS — I5023 Acute on chronic systolic (congestive) heart failure: Secondary | ICD-10-CM | POA: Diagnosis not present

## 2022-03-16 LAB — BASIC METABOLIC PANEL
Anion gap: 10 (ref 5–15)
BUN: 32 mg/dL — ABNORMAL HIGH (ref 8–23)
CO2: 30 mmol/L (ref 22–32)
Calcium: 8.6 mg/dL — ABNORMAL LOW (ref 8.9–10.3)
Chloride: 102 mmol/L (ref 98–111)
Creatinine, Ser: 1.22 mg/dL — ABNORMAL HIGH (ref 0.44–1.00)
GFR, Estimated: 47 mL/min — ABNORMAL LOW (ref >60)
Glucose, Bld: 105 mg/dL — ABNORMAL HIGH (ref 70–99)
Potassium: 4 mmol/L (ref 3.5–5.1)
Sodium: 142 mmol/L (ref 135–145)

## 2022-03-16 MED ORDER — MIDODRINE HCL 5 MG PO TABS
10.0000 mg | ORAL_TABLET | Freq: Two times a day (BID) | ORAL | Status: DC
  Administered 2022-03-16: 10 mg via ORAL
  Filled 2022-03-16: qty 2

## 2022-03-16 MED ORDER — POTASSIUM CHLORIDE CRYS ER 20 MEQ PO TBCR
40.0000 meq | EXTENDED_RELEASE_TABLET | Freq: Once | ORAL | Status: AC
  Administered 2022-03-16: 40 meq via ORAL
  Filled 2022-03-16: qty 2

## 2022-03-16 NOTE — Care Management Important Message (Signed)
Important Message  Patient Details  Name: Valerie Rodgers MRN: [id number] Date of Birth: 03-09-1951   Medicare Important Message Given:  Yes     Hannah Beat 03/16/2022, 12:44 PM

## 2022-03-16 NOTE — Progress Notes (Signed)
Mobility Specialist - Progress Note   03/16/22 1420  Mobility  Activity Ambulated with assistance in hallway  Level of Assistance Contact guard assist, steadying assist  Assistive Device Front wheel walker  Distance Ambulated (ft) 50 ft  Activity Response Tolerated well  $Mobility charge 1 Mobility   Pt was received in bed and agreeable to mobility. No complaints throughout ambulation. Pt was returned to bed with all needs met and bed alarm on.  Larey Seat

## 2022-03-16 NOTE — Progress Notes (Signed)
PROGRESS NOTE    Valerie Rodgers  HYI:[id number] DOB: 17-Sep-1950 DOA: 03/12/2022 PCP: Pcp, No  71 yo female with the past medical history of heart failure, atrial fibrillation, COPD, hypertension, CKD stage 3b, and polysubstance abuse, cocaine abuse who presented with edema and weakness. -In the ED labs noted creatinine of 1.5, BNP 3743, troponin 83, chest x-ray noted cardiomegaly, bilateral interstitial infiltrates and vascular congestion -Started on diuretics, limited by soft BP   Subjective: -Feels tired, breathing starting to improve  Assessment and Plan:  Acute on chronic systolic CHF ) -Echo  with reduced LV systolic function with EF <20% with global hypokinesis, RV systolic function with severe reduction, RV moderate enlargement, moderate MR. Moderate to severe TR.  -Complicated by longstanding cocaine abuse, poor compliance with medications - 2.4 L negative, urine output inaccurate -Blood pressure has been soft, started on midodrine by Dr. Cathlean Sauer, carvedilol, BiDil on hold -Continue IV Lasix at least 1 more day, continue empagliflozin -Followed by palliative care as well  COPD (chronic obstructive pulmonary disease) ([place]) -Continue bronchodilator therapy, supplemental O2  Wandering pacemaker Continue with ivabradine  -Carvedilol on hold, in sinus rhythm  Acute kidney injury on CKD stage 3b  hypomagnesemia  -Creatinine improving, avoid hypotension, monitor with diuresis  Essential hypertension -Meds as noted above  Decubitus ulcer of left buttock Chronic leg wounds -Present on admission, wound care following, continue dressing changes  Nicotine dependence, cigarettes, uncomplicated -Counseled  Cocaine abuse ([place]) Ongoing cocaine abuse is likely playing a major role in the patient's repeated bouts of acute heart failure Counseled at length again -TOC consulted, outpatient resources offered  DVT prophylaxis: Lovenox Code Status: Full code Family Communication:  Discussed with patient in detail, no family at bedside Disposition Plan: Home likely 48 hours  Consultants:    Procedures:   Antimicrobials:    Objective: Vitals:   03/15/22 1608 03/15/22 1730 03/15/22 1958 03/16/22 0543  BP: 107/76 106/63 110/76 109/80  Pulse: 74  77 68  Resp: 18  (!) 24 20  Temp: (!) 97.4 F (36.3 C)  98.3 F (36.8 C) 98 F (36.7 C)  TempSrc: Oral  Oral Oral  SpO2: 92%  97% 95%  Weight:    91.7 kg  Height:        Intake/Output Summary (Last 24 hours) at 03/16/2022 1143 Last data filed at 03/16/2022 0252 Gross per 24 hour  Intake 117 ml  Output 1300 ml  Net -1183 ml   Filed Weights   03/14/22 0335 03/15/22 0445 03/16/22 0543  Weight: 86.1 kg 90 kg 91.7 kg    Examination:  General exam: Chronically ill disheveled female laying in bed, AAOx3, no distress HEENT: Positive JVD CVS: S1-S2, irregularly irregular rhythm Lungs: Few basilar rales, otherwise clear Abdomen: Soft, nontender, bowel sounds present Extremities: Trace edema with wounds on both legs  Skin: As above  Psychiatry:  Mood & affect appropriate.     Data Reviewed:   CBC: Recent Labs  Lab 03/12/22 1754 03/13/22 0010 03/13/22 0504  WBC 8.0  --  7.6  NEUTROABS 6.6  --  6.6  HGB 13.8 15.3* 13.6  HCT 42.2 45.0 41.5  MCV 82.9  --  83.7  PLT 246  --  786   Basic Metabolic Panel: Recent Labs  Lab 03/12/22 1754 03/13/22 0010 03/13/22 0504 03/14/22 0232 03/15/22 0321 03/16/22 0245  NA 142 141 143 142 143 142  K 4.7 4.5 4.3 4.0 3.6 4.0  CL 104  --  104 106 104 102  CO2 23  --  25 30 31 30   GLUCOSE 115*  --  114* 178* 117* 105*  BUN 23  --  27* 35* 37* 32*  CREATININE 1.53*  --  1.68* 1.44* 1.42* 1.22*  CALCIUM 9.0  --  9.1 8.3* 8.5* 8.6*  MG 1.9  --  2.0 1.8 2.1  --    GFR: Estimated Creatinine Clearance: 45.7 mL/min (A) (by C-G formula based on SCr of 1.22 mg/dL (H)). Liver Function Tests: Recent Labs  Lab 03/12/22 1754 03/13/22 0504  AST 33 29  ALT 25 24   ALKPHOS 107 101  BILITOT 2.0* 1.8*  PROT 6.9 6.8  ALBUMIN 3.3* 3.3*   No results for input(s): "LIPASE", "AMYLASE" in the last 168 hours. No results for input(s): "AMMONIA" in the last 168 hours. Coagulation Profile: No results for input(s): "INR", "PROTIME" in the last 168 hours. Cardiac Enzymes: No results for input(s): "CKTOTAL", "CKMB", "CKMBINDEX", "TROPONINI" in the last 168 hours. BNP (last 3 results) No results for input(s): "PROBNP" in the last 8760 hours. HbA1C: No results for input(s): "HGBA1C" in the last 72 hours. CBG: No results for input(s): "GLUCAP" in the last 168 hours. Lipid Profile: No results for input(s): "CHOL", "HDL", "LDLCALC", "TRIG", "CHOLHDL", "LDLDIRECT" in the last 72 hours. Thyroid Function Tests: No results for input(s): "TSH", "T4TOTAL", "FREET4", "T3FREE", "THYROIDAB" in the last 72 hours. Anemia Panel: No results for input(s): "VITAMINB12", "FOLATE", "FERRITIN", "TIBC", "IRON", "RETICCTPCT" in the last 72 hours. Urine analysis:    Component Value Date/Time   COLORURINE STRAW (A) 11/14/2021 0458   APPEARANCEUR CLEAR 11/14/2021 0458   LABSPEC 1.006 11/14/2021 0458   PHURINE 5.0 11/14/2021 0458   GLUCOSEU NEGATIVE 11/14/2021 0458   HGBUR NEGATIVE 11/14/2021 0458   BILIRUBINUR NEGATIVE 11/14/2021 0458   KETONESUR NEGATIVE 11/14/2021 0458   PROTEINUR NEGATIVE 11/14/2021 0458   NITRITE NEGATIVE 11/14/2021 0458   LEUKOCYTESUR NEGATIVE 11/14/2021 0458   Sepsis Labs: @LABRCNTIP (procalcitonin:4,lacticidven:4)  ) Recent Results (from the past 240 hour(s))  SARS Coronavirus 2 by RT PCR (hospital order, performed in [facility] lab) *cepheid single result test* Anterior Nasal Swab     Status: None   Collection Time: 03/12/22 11:40 PM   Specimen: Anterior Nasal Swab  Result Value Ref Range Status   SARS Coronavirus 2 by RT PCR NEGATIVE NEGATIVE Final    Comment: (NOTE) SARS-CoV-2 target nucleic acids are NOT DETECTED.  The SARS-CoV-2  RNA is generally detectable in upper and lower respiratory specimens during the acute phase of infection. The lowest concentration of SARS-CoV-2 viral copies this assay can detect is 250 copies / mL. A negative result does not preclude SARS-CoV-2 infection and should not be used as the sole basis for treatment or other patient management decisions.  A negative result may occur with improper specimen collection / handling, submission of specimen other than nasopharyngeal swab, presence of viral mutation(s) within the areas targeted by this assay, and inadequate number of viral copies (<250 copies / mL). A negative result must be combined with clinical observations, patient history, and epidemiological information.  Fact Sheet for Patients:   https://www.patel.info/  Fact Sheet for Healthcare Providers: https://hall.com/  This test is not yet approved or  cleared by the [place] FDA and has been authorized for detection and/or diagnosis of SARS-CoV-2 by FDA under an Emergency Use Authorization (EUA).  This EUA will remain in effect (meaning this test can be used) for the duration of the COVID-19 declaration under Section 564(b)(1) of the  Act, 21 U.S.C. section 360bbb-3(b)(1), unless the authorization is terminated or revoked sooner.  Performed at [facility], [place] [address] [place], [place] [address]      Radiology Studies: No results found.   Scheduled Meds:  atorvastatin  20 mg Oral QHS   dapagliflozin propanediol  10 mg Oral Daily   enoxaparin (LOVENOX) injection  40 mg Subcutaneous Daily   furosemide  60 mg Intravenous BID   ivabradine  5 mg Oral BID WC   midodrine  5 mg Oral TID WC   multivitamin with minerals  1 tablet Oral Daily   nicotine  21 mg Transdermal Daily   polyethylene glycol  17 g Oral BID   Continuous Infusions:   LOS: 4 days    Time spent: 71min   Domenic Polite, MD [facility]   03/16/2022, 11:43 AM

## 2022-03-17 DIAGNOSIS — I5023 Acute on chronic systolic (congestive) heart failure: Secondary | ICD-10-CM | POA: Diagnosis not present

## 2022-03-17 LAB — BASIC METABOLIC PANEL
Anion gap: 11 (ref 5–15)
BUN: 29 mg/dL — ABNORMAL HIGH (ref 8–23)
CO2: 33 mmol/L — ABNORMAL HIGH (ref 22–32)
Calcium: 8.7 mg/dL — ABNORMAL LOW (ref 8.9–10.3)
Chloride: 96 mmol/L — ABNORMAL LOW (ref 98–111)
Creatinine, Ser: 1.1 mg/dL — ABNORMAL HIGH (ref 0.44–1.00)
GFR, Estimated: 53 mL/min — ABNORMAL LOW (ref >60)
Glucose, Bld: 130 mg/dL — ABNORMAL HIGH (ref 70–99)
Potassium: 3.7 mmol/L (ref 3.5–5.1)
Sodium: 140 mmol/L (ref 135–145)

## 2022-03-17 LAB — GLUCOSE, CAPILLARY
Glucose-Capillary: 103 mg/dL — ABNORMAL HIGH (ref 70–99)
Glucose-Capillary: 125 mg/dL — ABNORMAL HIGH (ref 70–99)

## 2022-03-17 LAB — MAGNESIUM: Magnesium: 1.8 mg/dL (ref 1.7–2.4)

## 2022-03-17 MED ORDER — POTASSIUM CHLORIDE CRYS ER 20 MEQ PO TBCR
40.0000 meq | EXTENDED_RELEASE_TABLET | Freq: Two times a day (BID) | ORAL | Status: AC
  Administered 2022-03-17 (×2): 40 meq via ORAL
  Filled 2022-03-17 (×2): qty 2

## 2022-03-17 MED ORDER — MAGNESIUM SULFATE 2 GM/50ML IV SOLN
2.0000 g | Freq: Once | INTRAVENOUS | Status: AC
  Administered 2022-03-17: 2 g via INTRAVENOUS
  Filled 2022-03-17: qty 50

## 2022-03-17 MED ORDER — FUROSEMIDE 10 MG/ML IJ SOLN
40.0000 mg | Freq: Two times a day (BID) | INTRAMUSCULAR | Status: DC
  Administered 2022-03-17 – 2022-03-18 (×2): 40 mg via INTRAVENOUS
  Filled 2022-03-17 (×2): qty 4

## 2022-03-17 MED ORDER — MIDODRINE HCL 5 MG PO TABS
5.0000 mg | ORAL_TABLET | Freq: Two times a day (BID) | ORAL | Status: DC
  Administered 2022-03-17 – 2022-03-19 (×5): 5 mg via ORAL
  Filled 2022-03-17 (×5): qty 1

## 2022-03-17 MED ORDER — CARVEDILOL 3.125 MG PO TABS
3.1250 mg | ORAL_TABLET | Freq: Two times a day (BID) | ORAL | Status: DC
  Administered 2022-03-17 – 2022-03-20 (×5): 3.125 mg via ORAL
  Filled 2022-03-17 (×5): qty 1

## 2022-03-17 MED ORDER — METOPROLOL TARTRATE 12.5 MG HALF TABLET: 12.5000 mg | ORAL_TABLET | Freq: Two times a day (BID) | ORAL | Status: DC

## 2022-03-17 NOTE — Plan of Care (Signed)

## 2022-03-17 NOTE — TOC Progression Note (Signed)
Transition of Care [facility]) - Progression Note    Patient Details  Name: Valerie Rodgers MRN: [id number] Date of Birth: 1950/09/16  [facility] CM/SW [place], [place] Phone Number: 03/17/2022, 3:36 PM  Clinical Narrative:      CSW received consult for patient. CSW following to speak with patient when better oriented. CSW awaiting PT/OT recommendations. CSW will continue to follow and assist with patients dc planning needs.  Expected Discharge Plan: Skilled Nursing Facility Barriers to Discharge: Continued Medical Work up  Expected Discharge Plan and Services Expected Discharge Plan: [place] In-house Referral: Clinical Social Work   Post Acute Care Choice: [employer]                                         Social Determinants of Health (SDOH) Interventions    Readmission Risk Interventions    03/17/2022    2:55 PM  Readmission Risk Prevention Plan  HRI or Home Care Consult Complete  Social Work Consult for [place] Planning/Counseling Complete  Palliative Care Screening Not Applicable  Medication Review [employer]) Referral to Pharmacy

## 2022-03-17 NOTE — Progress Notes (Signed)
PROGRESS NOTE    Valerie Rodgers  [id number] DOB: [birth year] DOA: 03/12/2022 PCP: Pcp, No  71 yo female with the past medical history of heart failure, atrial fibrillation, COPD, hypertension, CKD stage 3b, and polysubstance abuse, cocaine abuse who presented with edema and weakness. -In the ED labs noted creatinine of 1.5, BNP 3743, troponin 83, chest x-ray noted cardiomegaly, bilateral interstitial infiltrates and vascular congestion -Started on diuretics, limited by soft BP   Subjective: -Feels tired, breathing starting to improve  Assessment and Plan:  Acute on chronic systolic CHF ) -Echo  with reduced LV systolic function with EF <20% with global hypokinesis, RV systolic function with severe reduction, RV moderate enlargement, moderate MR. Moderate to severe TR.  -Complicated by longstanding cocaine abuse, poor compliance with medications, limited insight -She is 4.4 L negative -Blood pressure has been soft, started on midodrine by Dr. Cathlean Sauer, decrease midodrine dose -Sinus tachycardia and SVT noted, will restart carvedilol, BiDil on hold -Continue IV Lasix 1 more day decrease dose to 40 Mg twice daily, continue empagliflozin -Followed by palliative care as well as outpatient  COPD/chronic respiratory failure on 2 L home O2 -Continue bronchodilator therapy, supplemental O2  SVT Continue with ivabradine  -Carvedilol resumed  Acute kidney injury on CKD stage 3b  hypomagnesemia  -Creatinine improving, avoid hypotension, monitor with diuresis  Essential hypertension -Meds as noted above  Decubitus ulcer of left buttock Chronic leg wounds -Present on admission, wound care following, continue dressing changes  Nicotine dependence, cigarettes, uncomplicated -Counseled  Cocaine abuse ([place]) Ongoing cocaine abuse is likely playing a major role in the patient's repeated bouts of acute heart failure Counseled at length again -TOC consulted, outpatient resources  offered  DVT prophylaxis: Lovenox Code Status: Full code Family Communication: Discussed with patient in detail, no family at bedside Disposition Plan: Home likely 1 to 2 days  Consultants:    Procedures:   Antimicrobials:    Objective: Vitals:   03/16/22 1227 03/16/22 2023 03/17/22 0500 03/17/22 0536  BP: 109/80 110/78  (!) 120/92  Pulse: 97 68  87  Resp: 20     Temp: 99.2 F (37.3 C) 98.9 F (37.2 C)  98.7 F (37.1 C)  TempSrc: Oral Oral  Oral  SpO2: 93% 94%  94%  Weight:   91.6 kg   Height:        Intake/Output Summary (Last 24 hours) at 03/17/2022 1124 Last data filed at 03/16/2022 1900 Gross per 24 hour  Intake --  Output 2000 ml  Net -2000 ml   Filed Weights   03/15/22 0445 03/16/22 0543 03/17/22 0500  Weight: 90 kg 91.7 kg 91.6 kg    Examination:  General exam: Chronically ill disheveled female laying in bed, AAO x2, cognitive deficits, no distress HEENT: Positive JVD CVS: S1-S2, regular rhythm Lungs: Few basilar rales otherwise clear Abdomen: Soft, nontender, bowel sounds present Extremities: Trace edema, wounds on both legs, left leg with dressing Skin: As above  Psychiatry:  Mood & affect appropriate.     Data Reviewed:   CBC: Recent Labs  Lab 03/12/22 1754 03/13/22 0010 03/13/22 0504  WBC 8.0  --  7.6  NEUTROABS 6.6  --  6.6  HGB 13.8 15.3* 13.6  HCT 42.2 45.0 41.5  MCV 82.9  --  83.7  PLT 246  --  [id number]   Basic Metabolic Panel: Recent Labs  Lab 03/12/22 1754 03/13/22 0010 03/13/22 0504 03/14/22 0232 03/15/22 0321 03/16/22 0245 03/17/22 0147  NA 142   < >  143 142 143 142 140  K 4.7   < > 4.3 4.0 3.6 4.0 3.7  CL 104  --  104 106 104 102 96*  CO2 23  --  25 30 31 30  33*  GLUCOSE 115*  --  114* 178* 117* 105* 130*  BUN 23  --  27* 35* 37* 32* 29*  CREATININE 1.53*  --  1.68* 1.44* 1.42* 1.22* 1.10*  CALCIUM 9.0  --  9.1 8.3* 8.5* 8.6* 8.7*  MG 1.9  --  2.0 1.8 2.1  --  1.8   < > = values in this interval not displayed.    GFR: Estimated Creatinine Clearance: 50.7 mL/min (A) (by C-G formula based on SCr of 1.1 mg/dL (H)). Liver Function Tests: Recent Labs  Lab 03/12/22 1754 03/13/22 0504  AST 33 29  ALT 25 24  ALKPHOS 107 101  BILITOT 2.0* 1.8*  PROT 6.9 6.8  ALBUMIN 3.3* 3.3*   No results for input(s): "LIPASE", "AMYLASE" in the last 168 hours. No results for input(s): "AMMONIA" in the last 168 hours. Coagulation Profile: No results for input(s): "INR", "PROTIME" in the last 168 hours. Cardiac Enzymes: No results for input(s): "CKTOTAL", "CKMB", "CKMBINDEX", "TROPONINI" in the last 168 hours. BNP (last 3 results) No results for input(s): "PROBNP" in the last 8760 hours. HbA1C: No results for input(s): "HGBA1C" in the last 72 hours. CBG: Recent Labs  Lab 03/17/22 0802  GLUCAP 103*   Lipid Profile: No results for input(s): "CHOL", "HDL", "LDLCALC", "TRIG", "CHOLHDL", "LDLDIRECT" in the last 72 hours. Thyroid Function Tests: No results for input(s): "TSH", "T4TOTAL", "FREET4", "T3FREE", "THYROIDAB" in the last 72 hours. Anemia Panel: No results for input(s): "VITAMINB12", "FOLATE", "FERRITIN", "TIBC", "IRON", "RETICCTPCT" in the last 72 hours. Urine analysis:    Component Value Date/Time   COLORURINE STRAW (A) 11/14/2021 0458   APPEARANCEUR CLEAR 11/14/2021 0458   LABSPEC 1.006 11/14/2021 0458   PHURINE 5.0 11/14/2021 0458   GLUCOSEU NEGATIVE 11/14/2021 0458   HGBUR NEGATIVE 11/14/2021 0458   BILIRUBINUR NEGATIVE 11/14/2021 0458   KETONESUR NEGATIVE 11/14/2021 0458   PROTEINUR NEGATIVE 11/14/2021 0458   NITRITE NEGATIVE 11/14/2021 0458   LEUKOCYTESUR NEGATIVE 11/14/2021 0458   Sepsis Labs: @LABRCNTIP (procalcitonin:4,lacticidven:4)  ) Recent Results (from the past 240 hour(s))  SARS Coronavirus 2 by RT PCR (hospital order, performed in [facility] hospital lab) *cepheid single result test* Anterior Nasal Swab     Status: None   Collection Time: 03/12/22 11:40 PM   Specimen:  Anterior Nasal Swab  Result Value Ref Range Status   SARS Coronavirus 2 by RT PCR NEGATIVE NEGATIVE Final    Comment: (NOTE) SARS-CoV-2 target nucleic acids are NOT DETECTED.  The SARS-CoV-2 RNA is generally detectable in upper and lower respiratory specimens during the acute phase of infection. The lowest concentration of SARS-CoV-2 viral copies this assay can detect is 250 copies / mL. A negative result does not preclude SARS-CoV-2 infection and should not be used as the sole basis for treatment or other patient management decisions.  A negative result may occur with improper specimen collection / handling, submission of specimen other than nasopharyngeal swab, presence of viral mutation(s) within the areas targeted by this assay, and inadequate number of viral copies (<250 copies / mL). A negative result must be combined with clinical observations, patient history, and epidemiological information.  Fact Sheet for Patients:   RoadLapTop.co.za  Fact Sheet for Healthcare Providers: http://kim-miller.com/  This test is not yet approved or  cleared by  the [employer] and has been authorized for detection and/or diagnosis of SARS-CoV-2 by FDA under an Emergency Use Authorization (EUA).  This EUA will remain in effect (meaning this test can be used) for the duration of the COVID-19 declaration under Section 564(b)(1) of the Act, 21 U.S.C. section 360bbb-3(b)(1), unless the authorization is terminated or revoked sooner.  Performed at [facility], [place] [address] [place], [place] [address]      Radiology Studies: No results found.   Scheduled Meds:  atorvastatin  20 mg Oral QHS   dapagliflozin propanediol  10 mg Oral Daily   enoxaparin (LOVENOX) injection  40 mg Subcutaneous Daily   furosemide  60 mg Intravenous BID   ivabradine  5 mg Oral BID WC   midodrine  5 mg Oral BID WC   multivitamin with minerals  1 tablet Oral  Daily   nicotine  21 mg Transdermal Daily   polyethylene glycol  17 g Oral BID   potassium chloride  40 mEq Oral BID   Continuous Infusions:   LOS: 5 days    Time spent: 21min   Domenic Polite, MD [facility]   03/17/2022, 11:24 AM

## 2022-03-17 NOTE — Plan of Care (Signed)
  Problem: Education: Goal: Ability to verbalize understanding of medication therapies will improve Outcome: Progressing   Problem: Activity: Goal: Capacity to carry out activities will improve Outcome: Progressing   Problem: Cardiac: Goal: Ability to achieve and maintain adequate cardiopulmonary perfusion will improve Outcome: Progressing   Problem: Clinical Measurements: Goal: Respiratory complications will improve Outcome: Progressing   Problem: Activity: Goal: Risk for activity intolerance will decrease Outcome: Progressing   Problem: Nutrition: Goal: Adequate nutrition will be maintained Outcome: Progressing   Problem: Pain Managment: Goal: General experience of comfort will improve Outcome: Progressing   Problem: Safety: Goal: Ability to remain free from injury will improve Outcome: Progressing   Problem: Skin Integrity: Goal: Risk for impaired skin integrity will decrease Outcome: Progressing

## 2022-03-17 NOTE — Evaluation (Signed)
Physical Therapy Evaluation Patient Details Name: Valerie Rodgers MRN: [id number] DOB: 1950/07/16 Today's Date: 03/17/2022  History of Present Illness  Pt is 71 year old presented to [facility] on  9/24 with acute on chronic CHF. PMH - afib, CHF, cocaine abuse, bipolar, CKD, cirrhosis of liver, COPD on chronic 2L O2.  Clinical Impression  Pt presents to PT with poor mobility due to decr balance, decr strength, decr cognition and decr functional activity tolerance. Little support in pt's prior living situation. Recommend SNF for further rehab.        Recommendations for follow up therapy are one component of a multi-disciplinary discharge planning process, led by the attending physician.  Recommendations may be updated based on patient status, additional functional criteria and insurance authorization.  Follow Up Recommendations Skilled nursing-short term rehab (<3 hours/day) Can patient physically be transported by private vehicle: Yes    Assistance Recommended at Discharge Frequent or constant Supervision/Assistance  Patient can return home with the following  A lot of help with walking and/or transfers;A lot of help with bathing/dressing/bathroom;Direct supervision/assist for medications management;Assistance with cooking/housework;Assist for transportation;Help with stairs or ramp for entrance    Equipment Recommendations Other (comment) (To be determined)  Recommendations for Other Services       Functional Status Assessment Patient has had a recent decline in their functional status and demonstrates the ability to make significant improvements in function in a reasonable and predictable amount of time.     Precautions / Restrictions Precautions Precautions: Fall;Other (comment) Precaution Comments: watch SpO2      Mobility  Bed Mobility Overal bed mobility: Needs Assistance Bed Mobility: Supine to Sit, Sit to Supine     Supine to sit: Min assist, HOB elevated Sit to supine: Mod  assist   General bed mobility comments: Assist to bring legs off of bed and elevate trunk into sitting    Transfers Overall transfer level: Needs assistance Equipment used: Rolling walker (2 wheels) Transfers: Sit to/from Stand Sit to Stand: Mod assist, +2 safety/equipment           General transfer comment: Assist to bring hips up and for balance. Pt with very wide base and rotating to the left coming to stand.    Ambulation/Gait             Pre-gait activities: Took a few shuffling/sliding steps along the side of the bed with mod assist. General Gait Details: Unable to attempt due to poor cognition and weakness and impulsivity  Stairs            Wheelchair Mobility    Modified Rankin (Stroke Patients Only)       Balance Overall balance assessment: Needs assistance Sitting-balance support: No upper extremity supported, Feet supported Sitting balance-Leahy Scale: Fair     Standing balance support: Bilateral upper extremity supported, During functional activity, Reliant on assistive device for balance Standing balance-Leahy Scale: Poor Standing balance comment: Walker and min assist for static standing                             Pertinent Vitals/Pain Pain Assessment Pain Assessment: No/denies pain    Home Living Family/patient expects to be discharged to:: Other (Comment)                   Additional Comments: Pt has been living at a boarding house but can not return there because her room is on the first floor and the  bathroom is on the second floor and she has not been able to get upstairs to the bathroom    Prior Function Prior Level of Function : Patient poor historian/Family not available             Mobility Comments: Per last admission it appears she has been using a rolling walker       Hand Dominance   Dominant Hand: Right    Extremity/Trunk Assessment   Upper Extremity Assessment Upper Extremity Assessment:  Defer to OT evaluation    Lower Extremity Assessment Lower Extremity Assessment: Generalized weakness       Communication   Communication: Expressive difficulties (mumbles making it difficult to hear/understand)  Cognition Arousal/Alertness: Awake/alert Behavior During Therapy: Impulsive Overall Cognitive Status: No family/caregiver present to determine baseline cognitive functioning Area of Impairment: Attention, Memory, Following commands, Safety/judgement, Awareness, Problem solving                   Current Attention Level: Sustained Memory: Decreased recall of precautions, Decreased short-term memory Following Commands: Follows one step commands inconsistently, Follows one step commands with increased time Safety/Judgement: Decreased awareness of safety, Decreased awareness of deficits Awareness: Intellectual Problem Solving: Slow processing, Requires verbal cues, Requires tactile cues, Difficulty sequencing, Decreased initiation          General Comments General comments (skin integrity, edema, etc.): Room entered and pt had O2 out of nose. SpO2 80%. Replaced O2 at 2L with SpO2 rising to >90%    Exercises     Assessment/Plan    PT Assessment Patient needs continued PT services  PT Problem List Decreased strength;Decreased activity tolerance;Decreased balance;Decreased mobility;Decreased cognition;Decreased knowledge of use of DME;Decreased safety awareness       PT Treatment Interventions DME instruction;Gait training;Functional mobility training;Therapeutic activities;Therapeutic exercise;Balance training;Patient/family education;Cognitive remediation    PT Goals (Current goals can be found in the Care Plan section)  Acute Rehab PT Goals Patient Stated Goal: not stated PT Goal Formulation: Patient unable to participate in goal setting Time For Goal Achievement: 03/31/22 Potential to Achieve Goals: Fair    Frequency Min 3X/week     Co-evaluation                AM-PAC PT "6 Clicks" Mobility  Outcome Measure Help needed turning from your back to your side while in a flat bed without using bedrails?: A Little Help needed moving from lying on your back to sitting on the side of a flat bed without using bedrails?: A Little Help needed moving to and from a bed to a chair (including a wheelchair)?: Total Help needed standing up from a chair using your arms (e.g., wheelchair or bedside chair)?: A Lot Help needed to walk in hospital room?: Total Help needed climbing 3-5 steps with a railing? : Total 6 Click Score: 11    End of Session Equipment Utilized During Treatment: Oxygen Activity Tolerance: Patient limited by fatigue Patient left: in bed;with nursing/sitter in room (Nurse tech getting pt cleaned up)   PT Visit Diagnosis: Unsteadiness on feet (R26.81);Other abnormalities of gait and mobility (R26.89);Muscle weakness (generalized) (M62.81)    Time: 1530-1550 PT Time Calculation (min) (ACUTE ONLY): 20 min   Charges:   PT Evaluation $PT Eval Moderate Complexity: 1 Mod          [facility] PT Acute Rehabilitation Services Office [phone]   Angelina Ok [facility] 03/17/2022, 4:19 PM

## 2022-03-17 NOTE — TOC Initial Note (Addendum)
Transition of Care [facility]) - Initial/Assessment Note    Patient Details  Name: Valerie Rodgers MRN: [id number] Date of Birth: 1950/10/24  Transition of [facility]) CM/SW Contact:    Bethena Roys, RN Phone Number: 03/17/2022, 2:57 PM  Clinical Narrative:   Case Manager received a call from the [place] regarding patients home situation.  Patient's friend [name] states that the patient cannot return to her boarding home. Patient unable to get to her bathroom on the second floor and is defecating on herself. Friend states that the patient stays on the 1st floor without a bathroom. Friend states best place for the patient to return to is a SNF. Case Manager reached out to the [place] and MD- MD placed PT/OT orders for recommendations. CSW to speak with the patient regarding the above information.              Expected Discharge Plan: Skilled Nursing Facility Barriers to Discharge: Continued Medical Work up    Expected Discharge Plan and Services Expected Discharge Plan: [place] In-house Referral: Clinical Social Work   Post Acute Care Choice: [place]     Prior Living Arrangements/Services   Lives with::  (lives in a boarding home.) Patient language and need for interpreter reviewed:: Yes        Need for Family Participation in Patient Care: Yes (Comment) Care giver support system in place?: Yes (comment)   Criminal Activity/Legal Involvement Pertinent to Current Situation/Hospitalization: No - Comment as needed  Admission diagnosis:  Peripheral edema [R60.9] Acute on chronic combined systolic and diastolic congestive heart failure ([place]) [I50.43] Acute on chronic systolic CHF (congestive heart failure) ([place]) [I50.23] Patient Active Problem List   Diagnosis Date Noted   COPD (chronic obstructive pulmonary disease) ([place]) 03/13/2022   Decubitus ulcer of left buttock 03/13/2022   Acute kidney injury superimposed on chronic kidney disease ([place])  03/12/2022   Mixed hyperlipidemia 03/12/2022   Nicotine dependence, cigarettes, uncomplicated 40/01/6760   Acute on chronic systolic CHF (congestive heart failure) ([place]) 11/13/2021   Atrial flutter, paroxysmal ([place]) 11/13/2021   Elevated LFTs 08/10/2021   HFrEF (heart failure with reduced ejection fraction) ([place])    Cocaine abuse ([place]) 07/18/2021   Bipolar disorder ([place]) 07/18/2021   Essential hypertension 07/18/2021   PCP:  Merryl Hacker, No Pharmacy:   [facility], [place] [address] [place] [address] Phone: [phone] Fax: [phone]  [id number] [place] [place] [address] [place] [address] Phone: [phone] Fax: [phone]  [facility] [address] [place] [address] Phone: [phone] Fax: [phone]  Readmission Risk Interventions    03/17/2022    2:55 PM  Readmission Risk Prevention Plan  [place] or Home Care Consult Complete  Social Work Consult for [place] Planning/Counseling Complete  Palliative Care Screening Not Applicable  Medication Review Press photographer) Referral to Pharmacy

## 2022-03-18 DIAGNOSIS — I5023 Acute on chronic systolic (congestive) heart failure: Secondary | ICD-10-CM | POA: Diagnosis not present

## 2022-03-18 LAB — CBC
HCT: 39.1 % (ref 36.0–46.0)
Hemoglobin: 12.6 g/dL (ref 12.0–15.0)
MCH: 26.5 pg (ref 26.0–34.0)
MCHC: 32.2 g/dL (ref 30.0–36.0)
MCV: 82.3 fL (ref 80.0–100.0)
Platelets: 200 10*3/uL (ref 150–400)
RBC: 4.75 MIL/uL (ref 3.87–5.11)
RDW: 18.7 % — ABNORMAL HIGH (ref 11.5–15.5)
WBC: 8.1 10*3/uL (ref 4.0–10.5)
nRBC: 0.2 % (ref 0.0–0.2)

## 2022-03-18 LAB — BASIC METABOLIC PANEL
Anion gap: 9 (ref 5–15)
BUN: 27 mg/dL — ABNORMAL HIGH (ref 8–23)
CO2: 37 mmol/L — ABNORMAL HIGH (ref 22–32)
Calcium: 8.6 mg/dL — ABNORMAL LOW (ref 8.9–10.3)
Chloride: 97 mmol/L — ABNORMAL LOW (ref 98–111)
Creatinine, Ser: 1.08 mg/dL — ABNORMAL HIGH (ref 0.44–1.00)
GFR, Estimated: 55 mL/min — ABNORMAL LOW (ref >60)
Glucose, Bld: 111 mg/dL — ABNORMAL HIGH (ref 70–99)
Potassium: 4.2 mmol/L (ref 3.5–5.1)
Sodium: 143 mmol/L (ref 135–145)

## 2022-03-18 LAB — GLUCOSE, CAPILLARY: Glucose-Capillary: 103 mg/dL — ABNORMAL HIGH (ref 70–99)

## 2022-03-18 LAB — MAGNESIUM: Magnesium: 1.8 mg/dL (ref 1.7–2.4)

## 2022-03-18 MED ORDER — FUROSEMIDE 10 MG/ML IJ SOLN
20.0000 mg | Freq: Two times a day (BID) | INTRAMUSCULAR | Status: DC
  Administered 2022-03-18: 20 mg via INTRAVENOUS
  Filled 2022-03-18: qty 2

## 2022-03-18 NOTE — TOC Initial Note (Addendum)
Transition of Care [facility]) - Initial/Assessment Note    Patient Details  Name: Valerie Rodgers MRN: [id number] Date of Birth: 1950-08-29  [facility]) CM/SW Contact:    Inis Sizer, LCSW Phone Number: 03/18/2022, 1:46 PM  Clinical Narrative:                 1:40pm: CSW spoke with patient to discuss PT recommendations for SNF. Patient is adamant that she is not going to a facility at discharge that she wants caregivers to come see her in her home. Patient adamant she can care for herself and does not need SNF. Patient states she has neighbors that help her if necessary.  CSW spoke with [place] at [facility] who states the agency cannot accept the patient.  CSW spoke with Laurelyn Sickle at [facility] to request agency accept patient. Patient will be reviewed by agency administration and Laurelyn Sickle will return call to CSW.  2:45pm: CSW spoke with [place] of [facility] who states the agency cannot accept patient.  CSW spoke with Elnita Maxwell at [facility] who states the agency can accept the patient for services.  Expected Discharge Plan: Home w Home Health Services Barriers to Discharge: Continued Medical Work up   Patient Goals and CMS Choice Patient states their goals for this hospitalization and ongoing recovery are:: Go home!      Expected Discharge Plan and Services Expected Discharge Plan: Home w Home Health Services In-house Referral: Clinical Social Work   Post Acute Care Choice: Home Health Living arrangements for the past 2 months: Boarding House                                      Prior Living Arrangements/Services Living arrangements for the past 2 months: [employer] Lives with::  (Boarding house) Patient language and need for interpreter reviewed:: Yes Do you feel safe going back to the place where you live?: Yes      Need for Family Participation in Patient Care: Yes (Comment) Care giver support system in place?: Yes (comment)   Criminal Activity/Legal  Involvement Pertinent to Current Situation/Hospitalization: No - Comment as needed  Activities of Daily Living      Permission Sought/Granted                  Emotional Assessment Appearance:: Appears stated age Attitude/Demeanor/Rapport: Engaged Affect (typically observed): Accepting, Calm, Pleasant Orientation: : Oriented to Self, Oriented to Place, Oriented to  Time Alcohol / Substance Use: Illicit Drugs Psych Involvement: No (comment)  Admission diagnosis:  Peripheral edema [R60.9] Acute on chronic combined systolic and diastolic congestive heart failure (HCC) [I50.43] Acute on chronic systolic CHF (congestive heart failure) (HCC) [I50.23] Patient Active Problem List   Diagnosis Date Noted   COPD (chronic obstructive pulmonary disease) (HCC) 03/13/2022   Decubitus ulcer of left buttock 03/13/2022   Acute kidney injury superimposed on chronic kidney disease (HCC) 03/12/2022   Mixed hyperlipidemia 03/12/2022   Nicotine dependence, cigarettes, uncomplicated 03/12/2022   Acute on chronic systolic CHF (congestive heart failure) (HCC) 11/13/2021   Atrial flutter, paroxysmal (HCC) 11/13/2021   Elevated LFTs 08/10/2021   HFrEF (heart failure with reduced ejection fraction) (HCC)    Cocaine abuse (HCC) 07/18/2021   Bipolar disorder (HCC) 07/18/2021   Essential hypertension 07/18/2021   PCP:  Pcp, No Pharmacy:   [facility] - [place], [place] - [address] [place]  [place] [address] Phone: [phone] Fax: [phone]  [id number] [place], [place]  [facility] [place] [address] Phone: [phone] Fax: [phone]  [facility] [address] [place] [address] Phone: [phone] Fax: [phone]     Social Determinants of Health (SDOH) Interventions    Readmission Risk Interventions    03/17/2022    2:55 PM  Readmission Risk Prevention Plan  HRI or [place] Complete  Social Work Consult for [place] Planning/Counseling Complete  Palliative Care Screening Not Applicable  Medication Review Press photographer) Referral to Pharmacy

## 2022-03-18 NOTE — Plan of Care (Signed)

## 2022-03-18 NOTE — Progress Notes (Signed)
PROGRESS NOTE    Valerie Rodgers  EYC:[id number] DOB: Mar 26, 1951 DOA: 03/12/2022 PCP: Pcp, No  71 yo female with the past medical history of heart failure, atrial fibrillation, COPD, hypertension, CKD stage 3b, and polysubstance abuse, cocaine abuse who presented with edema and weakness. -In the ED labs noted creatinine of 1.5, BNP 3743, troponin 83, chest x-ray noted cardiomegaly, bilateral interstitial infiltrates and vascular congestion -Started on diuretics, limited by soft BP   Subjective: -Feels okay, declines rehab  Assessment and Plan:  Acute on chronic systolic CHF ) -Echo  with reduced LV systolic function with EF <20% with global hypokinesis, RV systolic function with severe reduction, RV moderate enlargement, moderate MR. Moderate to severe TR.  -Complicated by longstanding cocaine abuse, poor compliance with medications, limited insight -She is 6.5 L negative, blood pressure is soft we will decrease Lasix dose today -started on midodrine by Dr. Cathlean Sauer earlier in the week -Sinus tachycardia and SVT noted, started carvedilol, BiDil on hold -continue empagliflozin -Followed by palliative care as well as outpatient  S DOH -Limited by polysubstance abuse/cocaine, poor compliance with meds, poor health literacy, TOC team was also called by patient's friend who reported that she cannot return to her boardinghouse as she is unable to get to her bathroom on the second floor and defecates on herself frequently -TOC following, PT OT eval completed, SNF recommended, she will need some more convincing to consider rehab  COPD/chronic respiratory failure on 2 L home O2 -Continue bronchodilator therapy, supplemental O2  SVT Continue with ivabradine  -Carvedilol resumed  Acute kidney injury on CKD stage 3b  hypomagnesemia  -Creatinine improving, avoid hypotension, monitor with diuresis  Essential hypertension -Meds as noted above  Decubitus ulcer of left buttock Chronic leg  wounds -Present on admission, wound care following, continue dressing changes  Nicotine dependence, cigarettes, uncomplicated -Counseled  Cocaine abuse ([place]) Ongoing cocaine abuse is likely playing a major role in the patient's repeated bouts of acute heart failure Counseled at length again -TOC consulted, outpatient resources offered  DVT prophylaxis: Lovenox Code Status: Full code Family Communication: Discussed with patient in detail, no family at bedside Disposition Plan: To be determined, likely Monday  Consultants:    Procedures:   Antimicrobials:    Objective: Vitals:   03/17/22 2004 03/17/22 2256 03/18/22 0342 03/18/22 0345  BP: 125/83 108/73 99/69   Pulse: 61 87 62   Resp: 20 20 20    Temp: 97.8 F (36.6 C) 98 F (36.7 C) 98.1 F (36.7 C)   TempSrc: Oral Oral Oral   SpO2: 97% 91% 93%   Weight:    84.4 kg  Height:        Intake/Output Summary (Last 24 hours) at 03/18/2022 1117 Last data filed at 03/18/2022 0342 Gross per 24 hour  Intake 360.34 ml  Output 2450 ml  Net -2089.66 ml   Filed Weights   03/16/22 0543 03/17/22 0500 03/18/22 0345  Weight: 91.7 kg 91.6 kg 84.4 kg    Examination:  General exam: Chronically ill disheveled female laying in bed, AAO x2, cognitive deficits, no distress HEENT: Positive JVD CVS: S1-S2, regular rhythm Lungs: Improving air movement, rare basilar rales Abdomen: Soft, nontender, bowel sounds present Extremities: Trace edema,wounds on both legs, left leg with dressing Skin: As above  Psychiatry:  Mood & affect appropriate.     Data Reviewed:   CBC: Recent Labs  Lab 03/12/22 1754 03/13/22 0010 03/13/22 0504 03/18/22 0201  WBC 8.0  --  7.6 8.1  NEUTROABS  6.6  --  6.6  --   HGB 13.8 15.3* 13.6 12.6  HCT 42.2 45.0 41.5 39.1  MCV 82.9  --  83.7 82.3  PLT 246  --  197 [id number]   Basic Metabolic Panel: Recent Labs  Lab 03/13/22 0504 03/14/22 0232 03/15/22 0321 03/16/22 0245 03/17/22 0147 03/18/22 0201  NA  143 142 143 142 140 143  K 4.3 4.0 3.6 4.0 3.7 4.2  CL 104 106 104 102 96* 97*  CO2 25 30 31 30  33* 37*  GLUCOSE 114* 178* 117* 105* 130* 111*  BUN 27* 35* 37* 32* 29* 27*  CREATININE 1.68* 1.44* 1.42* 1.22* 1.10* 1.08*  CALCIUM 9.1 8.3* 8.5* 8.6* 8.7* 8.6*  MG 2.0 1.8 2.1  --  1.8 1.8   GFR: Estimated Creatinine Clearance: 49.5 mL/min (A) (by C-G formula based on SCr of 1.08 mg/dL (H)). Liver Function Tests: Recent Labs  Lab 03/12/22 1754 03/13/22 0504  AST 33 29  ALT 25 24  ALKPHOS 107 101  BILITOT 2.0* 1.8*  PROT 6.9 6.8  ALBUMIN 3.3* 3.3*   No results for input(s): "LIPASE", "AMYLASE" in the last 168 hours. No results for input(s): "AMMONIA" in the last 168 hours. Coagulation Profile: No results for input(s): "INR", "PROTIME" in the last 168 hours. Cardiac Enzymes: No results for input(s): "CKTOTAL", "CKMB", "CKMBINDEX", "TROPONINI" in the last 168 hours. BNP (last 3 results) No results for input(s): "PROBNP" in the last 8760 hours. HbA1C: No results for input(s): "HGBA1C" in the last 72 hours. CBG: Recent Labs  Lab 03/17/22 0802 03/17/22 1607 03/18/22 0720  GLUCAP 103* 125* 103*   Lipid Profile: No results for input(s): "CHOL", "HDL", "LDLCALC", "TRIG", "CHOLHDL", "LDLDIRECT" in the last 72 hours. Thyroid Function Tests: No results for input(s): "TSH", "T4TOTAL", "FREET4", "T3FREE", "THYROIDAB" in the last 72 hours. Anemia Panel: No results for input(s): "VITAMINB12", "FOLATE", "FERRITIN", "TIBC", "IRON", "RETICCTPCT" in the last 72 hours. Urine analysis:    Component Value Date/Time   COLORURINE STRAW (A) 11/14/2021 0458   APPEARANCEUR CLEAR 11/14/2021 0458   LABSPEC 1.006 11/14/2021 0458   PHURINE 5.0 11/14/2021 0458   GLUCOSEU NEGATIVE 11/14/2021 0458   HGBUR NEGATIVE 11/14/2021 0458   BILIRUBINUR NEGATIVE 11/14/2021 0458   KETONESUR NEGATIVE 11/14/2021 0458   PROTEINUR NEGATIVE 11/14/2021 0458   NITRITE NEGATIVE 11/14/2021 0458   LEUKOCYTESUR  NEGATIVE 11/14/2021 0458   Sepsis Labs: @LABRCNTIP (procalcitonin:4,lacticidven:4)  ) Recent Results (from the past 240 hour(s))  SARS Coronavirus 2 by RT PCR (hospital order, performed in [facility] hospital lab) *cepheid single result test* Anterior Nasal Swab     Status: None   Collection Time: 03/12/22 11:40 PM   Specimen: Anterior Nasal Swab  Result Value Ref Range Status   SARS Coronavirus 2 by RT PCR NEGATIVE NEGATIVE Final    Comment: (NOTE) SARS-CoV-2 target nucleic acids are NOT DETECTED.  The SARS-CoV-2 RNA is generally detectable in upper and lower respiratory specimens during the acute phase of infection. The lowest concentration of SARS-CoV-2 viral copies this assay can detect is 250 copies / mL. A negative result does not preclude SARS-CoV-2 infection and should not be used as the sole basis for treatment or other patient management decisions.  A negative result may occur with improper specimen collection / handling, submission of specimen other than nasopharyngeal swab, presence of viral mutation(s) within the areas targeted by this assay, and inadequate number of viral copies (<250 copies / mL). A negative result must be combined with clinical observations, patient  history, and epidemiological information.  Fact Sheet for Patients:   https://www.patel.info/  Fact Sheet for Healthcare Providers: https://hall.com/  This test is not yet approved or  cleared by the [place] FDA and has been authorized for detection and/or diagnosis of SARS-CoV-2 by FDA under an Emergency Use Authorization (EUA).  This EUA will remain in effect (meaning this test can be used) for the duration of the COVID-19 declaration under Section 564(b)(1) of the Act, 21 U.S.C. section 360bbb-3(b)(1), unless the authorization is terminated or revoked sooner.  Performed at [facility], [place] [address], [place], [place] [address]       Radiology Studies: No results found.   Scheduled Meds:  atorvastatin  20 mg Oral QHS   carvedilol  3.125 mg Oral BID WC   dapagliflozin propanediol  10 mg Oral Daily   enoxaparin (LOVENOX) injection  40 mg Subcutaneous Daily   furosemide  40 mg Intravenous BID   ivabradine  5 mg Oral BID WC   midodrine  5 mg Oral BID WC   multivitamin with minerals  1 tablet Oral Daily   nicotine  21 mg Transdermal Daily   polyethylene glycol  17 g Oral BID   Continuous Infusions:   LOS: 6 days    Time spent: 38min   Domenic Polite, MD [facility]   03/18/2022, 11:17 AM

## 2022-03-18 NOTE — Evaluation (Signed)
Occupational Therapy Evaluation Patient Details Name: Valerie Rodgers MRN: [id number] DOB: 16-Apr-1951 Today's Date: 03/18/2022   History of Present Illness Pt is 71 year old presented to [facility] on  9/24 with acute on chronic CHF. PMH - afib, CHF, cocaine abuse, bipolar, CKD, cirrhosis of liver, COPD on chronic 2L O2.   Clinical Impression   Moriya was evaluated s/p the above admission list - PLOF and home set up difficult to obtain as pt is not a reliable historian. Upon evaluation pt demonstrated functional limitations due to impaired cognition, generalized weakness, BLE pain, decreased activity tolerance and balance. Overall she needed mod A for bed mobility, max A to stand with RW and min A to pivot to recliner. Due to deficits listed below she also requires up to max A for ADLs. She will benefit from OT acutely. Recommend d/c to SNF for continued rehab and safety.      Recommendations for follow up therapy are one component of a multi-disciplinary discharge planning process, led by the attending physician.  Recommendations may be updated based on patient status, additional functional criteria and insurance authorization.   Follow Up Recommendations  Skilled nursing-short term rehab (<3 hours/day)    Assistance Recommended at Discharge Frequent or constant Supervision/Assistance  Patient can return home with the following A lot of help with walking and/or transfers;A lot of help with bathing/dressing/bathroom;Assistance with cooking/housework;Direct supervision/assist for medications management;Direct supervision/assist for financial management;Assist for transportation;Help with stairs or ramp for entrance    Functional Status Assessment  Patient has had a recent decline in their functional status and demonstrates the ability to make significant improvements in function in a reasonable and predictable amount of time.  Equipment Recommendations  None recommended by OT    Recommendations for  Other Services       Precautions / Restrictions Precautions Precautions: Fall;Other (comment) Precaution Comments: watch SpO2, baseline 2-4L Restrictions Weight Bearing Restrictions: No      Mobility Bed Mobility Overal bed mobility: Needs Assistance Bed Mobility: Supine to Sit     Supine to sit: Min assist     General bed mobility comments: increased time and effort needed    Transfers Overall transfer level: Needs assistance Equipment used: Rolling walker (2 wheels) Transfers: Sit to/from Stand Sit to Stand: Max assist                  Balance Overall balance assessment: Needs assistance Sitting-balance support: No upper extremity supported, Feet supported Sitting balance-Leahy Scale: Fair     Standing balance support: Bilateral upper extremity supported, During functional activity, Reliant on assistive device for balance Standing balance-Leahy Scale: Poor                             ADL either performed or assessed with clinical judgement   ADL Overall ADL's : Needs assistance/impaired Eating/Feeding: Independent;Sitting   Grooming: Set up;Sitting   Upper Body Bathing: Minimal assistance;Sitting   Lower Body Bathing: Sit to/from stand;Maximal assistance   Upper Body Dressing : Minimal assistance;Sitting   Lower Body Dressing: Maximal assistance;Sit to/from stand   Toilet Transfer: Moderate assistance;Ambulation;Rolling walker (2 wheels)   Toileting- Clothing Manipulation and Hygiene: Maximal assistance;Sit to/from stand       Functional mobility during ADLs: Maximal assistance General ADL Comments: assist due to generalized weakness and impaired cognition. On 2L [place] with increased WOB noted throughout     Vision Baseline Vision/History: 2 Legally blind;1 Wears glasses Vision  Assessment?: No apparent visual deficits     Perception     Praxis      Pertinent Vitals/Pain Pain Assessment Pain Assessment: No/denies pain      Hand Dominance Right   Extremity/Trunk Assessment Upper Extremity Assessment Upper Extremity Assessment: Generalized weakness   Lower Extremity Assessment Lower Extremity Assessment: Generalized weakness   Cervical / Trunk Assessment Cervical / Trunk Assessment: Kyphotic   Communication Communication Communication: Expressive difficulties   Cognition Arousal/Alertness: Awake/alert Behavior During Therapy: Impulsive Overall Cognitive Status: No family/caregiver present to determine baseline cognitive functioning Area of Impairment: Attention, Memory, Following commands, Safety/judgement, Awareness, Problem solving                   Current Attention Level: Sustained Memory: Decreased recall of precautions, Decreased short-term memory Following Commands: Follows one step commands inconsistently, Follows one step commands with increased time Safety/Judgement: Decreased awareness of safety, Decreased awareness of deficits Awareness: Intellectual Problem Solving: Slow processing, Requires verbal cues, Requires tactile cues, Difficulty sequencing, Decreased initiation General Comments: Pt reporting that she lives at home with her cousins who assist her sometimes, however hcart states she is from a boarding house. Pt had poor attention and is self distracting, needs simple one step repetitive cues     General Comments  VSS on 2L, increased WOB with any movement    Exercises     Shoulder Instructions      Home Living Family/patient expects to be discharged to:: Other (Comment)                                 Additional Comments: Pt has been living at a boarding house but can not return there because her room is on the first floor and the bathroom is on the second floor and she has not been able to get upstairs to the bathroom      Prior Functioning/Environment Prior Level of Function : Patient poor historian/Family not available              Mobility Comments: Per last admission it appears she has been using a rolling walker ADLs Comments: States her cousins "sometimes" help her - pt not a reliable historian        OT Problem List: Decreased strength;Decreased range of motion;Decreased activity tolerance;Impaired balance (sitting and/or standing);Decreased cognition;Decreased safety awareness;Decreased knowledge of use of DME or AE;Decreased knowledge of precautions;Cardiopulmonary status limiting activity      OT Treatment/Interventions: Self-care/ADL training;Therapeutic exercise;DME and/or AE instruction;Therapeutic activities;Patient/family education;Balance training    OT Goals(Current goals can be found in the care plan section) Acute Rehab OT Goals Patient Stated Goal: to go home OT Goal Formulation: With patient Time For Goal Achievement: 04/01/22 Potential to Achieve Goals: Good ADL Goals Pt Will Perform Grooming: with min guard assist;standing Pt Will Perform Upper Body Dressing: Independently Pt Will Perform Lower Body Dressing: with min assist;sit to/from stand Pt Will Transfer to Toilet: with min assist;ambulating Pt Will Perform Toileting - Clothing Manipulation and hygiene: with modified independence;sitting/lateral leans  OT Frequency: Min 2X/week    Co-evaluation              AM-PAC OT "6 Clicks" Daily Activity     Outcome Measure Help from another person eating meals?: None Help from another person taking care of personal grooming?: A Little Help from another person toileting, which includes using toliet, bedpan, or urinal?: A Lot Help from another person bathing (  including washing, rinsing, drying)?: A Lot Help from another person to put on and taking off regular upper body clothing?: A Little Help from another person to put on and taking off regular lower body clothing?: A Lot 6 Click Score: 16   End of Session Equipment Utilized During Treatment: Gait belt;Rolling walker (2  wheels);Oxygen Nurse Communication: Mobility status  Activity Tolerance: Patient tolerated treatment well Patient left: in chair;with call bell/phone within reach;with chair alarm set  OT Visit Diagnosis: Unsteadiness on feet (R26.81);Other abnormalities of gait and mobility (R26.89);Muscle weakness (generalized) (M62.81);History of falling (Z91.81)                Time: 9509-3267 OT Time Calculation (min): 22 min Charges:  OT General Charges $OT Visit: 1 Visit OT Evaluation $OT Eval Moderate Complexity: 1 Mod    Kyson Kupper D Causey 03/18/2022, 11:46 AM

## 2022-03-19 DIAGNOSIS — I5023 Acute on chronic systolic (congestive) heart failure: Secondary | ICD-10-CM | POA: Diagnosis not present

## 2022-03-19 LAB — BASIC METABOLIC PANEL
Anion gap: 8 (ref 5–15)
BUN: 28 mg/dL — ABNORMAL HIGH (ref 8–23)
CO2: 37 mmol/L — ABNORMAL HIGH (ref 22–32)
Calcium: 8.2 mg/dL — ABNORMAL LOW (ref 8.9–10.3)
Chloride: 98 mmol/L (ref 98–111)
Creatinine, Ser: 1.15 mg/dL — ABNORMAL HIGH (ref 0.44–1.00)
GFR, Estimated: 51 mL/min — ABNORMAL LOW (ref >60)
Glucose, Bld: 95 mg/dL (ref 70–99)
Potassium: 4 mmol/L (ref 3.5–5.1)
Sodium: 143 mmol/L (ref 135–145)

## 2022-03-19 LAB — MAGNESIUM: Magnesium: 1.8 mg/dL (ref 1.7–2.4)

## 2022-03-19 MED ORDER — MIDODRINE HCL 5 MG PO TABS
10.0000 mg | ORAL_TABLET | Freq: Two times a day (BID) | ORAL | Status: DC
  Administered 2022-03-19 – 2022-03-20 (×2): 10 mg via ORAL
  Filled 2022-03-19 (×2): qty 2

## 2022-03-19 NOTE — Progress Notes (Addendum)
PROGRESS NOTE    Valerie Rodgers  SWN:[id number] DOB: [birth year] DOA: 03/12/2022 PCP: Pcp, No  71 yo female with the past medical history of heart failure, atrial fibrillation, COPD, hypertension, CKD stage 3b, and polysubstance abuse, cocaine abuse who presented with edema and weakness. -In the ED labs noted creatinine of 1.5, BNP 3743, troponin 83, chest x-ray noted cardiomegaly, bilateral interstitial infiltrates and vascular congestion -Started on diuretics, limited by soft BP   Subjective: -Feels okay, declines rehab  Assessment and Plan:  Acute on chronic systolic CHF ) -Echo  with reduced LV systolic function with EF <20% with global hypokinesis, RV systolic function with severe reduction, RV moderate enlargement, moderate MR. Moderate to severe TR.  -Complicated by longstanding cocaine abuse, poor compliance with medications, extremely limited insight -She is 6.9 L negative, blood pressure is soft we will hold IV Lasix today -started on midodrine by Dr. Cathlean Sauer earlier in the week -Sinus tachycardia and SVT noted, started carvedilol, BiDil on hold -continue empagliflozin -Followed by palliative care as well as outpatient  S DOH -Limited by polysubstance abuse/cocaine, poor compliance with meds, poor health literacy, TOC team was also called by patient's friend who reported that she cannot return to her boardinghouse as she is unable to get to her bathroom on the second floor and defecates on herself frequently -TOC following, PT OT eval completed, SNF recommended, she will need some more convincing to consider rehab, high risk of quick readmission  COPD/chronic respiratory failure on 2 L home O2 -Continue bronchodilator therapy, supplemental O2  SVT Continue with ivabradine  -Carvedilol resumed  Acute kidney injury on CKD stage 3b  hypomagnesemia  -Creatinine improving, avoid hypotension, monitor with diuresis  Essential hypertension -Meds as noted above  Decubitus  ulcer of left buttock Chronic leg wounds -Present on admission, wound care following, continue dressing changes  Nicotine dependence, cigarettes, uncomplicated -Counseled  Cocaine abuse ([place]) Ongoing cocaine abuse is likely playing a major role in the patient's repeated bouts of acute heart failure Counseled at length again -TOC consulted, outpatient resources offered  DVT prophylaxis: Lovenox Code Status: Full code Family Communication: Discussed with patient in detail, no family at bedside Disposition Plan: To be determined, likely Monday  Consultants:    Procedures:   Antimicrobials:    Objective: Vitals:   03/18/22 1919 03/19/22 0009 03/19/22 0500 03/19/22 0537  BP: (!) 89/62 (!) 88/61  (!) 84/54  Pulse: 61 69  93  Resp: 19 17  20   Temp: 98.1 F (36.7 C) 98 F (36.7 C)  98.1 F (36.7 C)  TempSrc: Oral Oral  Oral  SpO2: 97% 96%  93%  Weight:   79.3 kg   Height:        Intake/Output Summary (Last 24 hours) at 03/19/2022 1032 Last data filed at 03/19/2022 0500 Gross per 24 hour  Intake 240 ml  Output 600 ml  Net -360 ml   Filed Weights   03/17/22 0500 03/18/22 0345 03/19/22 0500  Weight: 91.6 kg 84.4 kg 79.3 kg    Examination:  General exam: Chronically ill disheveled female laying in bed, AAO x2, cognitive deficits, no distress HEENT: Positive JVD CVS: S1-S2, regular rhythm Lungs: Clear anteriorly, decreased at the bases Abdomen: Soft, nontender, bowel sounds present  Extremities: Trace edema,wounds on both legs, left leg with dressing Skin: As above  Psychiatry: Extremely poor insight and judgment    Data Reviewed:   CBC: Recent Labs  Lab 03/12/22 1754 03/13/22 0010 03/13/22 0504 03/18/22 0201  WBC 8.0  --  7.6 8.1  NEUTROABS 6.6  --  6.6  --   HGB 13.8 15.3* 13.6 12.6  HCT 42.2 45.0 41.5 39.1  MCV 82.9  --  83.7 82.3  PLT 246  --  197 [id number]   Basic Metabolic Panel: Recent Labs  Lab 03/14/22 0232 03/15/22 0321 03/16/22 0245  03/17/22 0147 03/18/22 0201 03/19/22 0115 03/19/22 0330  NA 142 143 142 140 143 143  --   K 4.0 3.6 4.0 3.7 4.2 4.0  --   CL 106 104 102 96* 97* 98  --   CO2 30 31 30  33* 37* 37*  --   GLUCOSE 178* 117* 105* 130* 111* 95  --   BUN 35* 37* 32* 29* 27* 28*  --   CREATININE 1.44* 1.42* 1.22* 1.10* 1.08* 1.15*  --   CALCIUM 8.3* 8.5* 8.6* 8.7* 8.6* 8.2*  --   MG 1.8 2.1  --  1.8 1.8  --  1.8   GFR: Estimated Creatinine Clearance: 45 mL/min (A) (by C-G formula based on SCr of 1.15 mg/dL (H)). Liver Function Tests: Recent Labs  Lab 03/12/22 1754 03/13/22 0504  AST 33 29  ALT 25 24  ALKPHOS 107 101  BILITOT 2.0* 1.8*  PROT 6.9 6.8  ALBUMIN 3.3* 3.3*   No results for input(s): "LIPASE", "AMYLASE" in the last 168 hours. No results for input(s): "AMMONIA" in the last 168 hours. Coagulation Profile: No results for input(s): "INR", "PROTIME" in the last 168 hours. Cardiac Enzymes: No results for input(s): "CKTOTAL", "CKMB", "CKMBINDEX", "TROPONINI" in the last 168 hours. BNP (last 3 results) No results for input(s): "PROBNP" in the last 8760 hours. HbA1C: No results for input(s): "HGBA1C" in the last 72 hours. CBG: Recent Labs  Lab 03/17/22 0802 03/17/22 1607 03/18/22 0720  GLUCAP 103* 125* 103*   Lipid Profile: No results for input(s): "CHOL", "HDL", "LDLCALC", "TRIG", "CHOLHDL", "LDLDIRECT" in the last 72 hours. Thyroid Function Tests: No results for input(s): "TSH", "T4TOTAL", "FREET4", "T3FREE", "THYROIDAB" in the last 72 hours. Anemia Panel: No results for input(s): "VITAMINB12", "FOLATE", "FERRITIN", "TIBC", "IRON", "RETICCTPCT" in the last 72 hours. Urine analysis:    Component Value Date/Time   COLORURINE STRAW (A) 11/14/2021 0458   APPEARANCEUR CLEAR 11/14/2021 0458   LABSPEC 1.006 11/14/2021 0458   PHURINE 5.0 11/14/2021 0458   GLUCOSEU NEGATIVE 11/14/2021 0458   HGBUR NEGATIVE 11/14/2021 0458   BILIRUBINUR NEGATIVE 11/14/2021 0458   KETONESUR NEGATIVE  11/14/2021 0458   PROTEINUR NEGATIVE 11/14/2021 0458   NITRITE NEGATIVE 11/14/2021 0458   LEUKOCYTESUR NEGATIVE 11/14/2021 0458   Sepsis Labs: @LABRCNTIP (procalcitonin:4,lacticidven:4)  ) Recent Results (from the past 240 hour(s))  SARS Coronavirus 2 by RT PCR (hospital order, performed in [facility] hospital lab) *cepheid single result test* Anterior Nasal Swab     Status: None   Collection Time: 03/12/22 11:40 PM   Specimen: Anterior Nasal Swab  Result Value Ref Range Status   SARS Coronavirus 2 by RT PCR NEGATIVE NEGATIVE Final    Comment: (NOTE) SARS-CoV-2 target nucleic acids are NOT DETECTED.  The SARS-CoV-2 RNA is generally detectable in upper and lower respiratory specimens during the acute phase of infection. The lowest concentration of SARS-CoV-2 viral copies this assay can detect is 250 copies / mL. A negative result does not preclude SARS-CoV-2 infection and should not be used as the sole basis for treatment or other patient management decisions.  A negative result may occur with improper specimen collection / handling,  submission of specimen other than nasopharyngeal swab, presence of viral mutation(s) within the areas targeted by this assay, and inadequate number of viral copies (<250 copies / mL). A negative result must be combined with clinical observations, patient history, and epidemiological information.  Fact Sheet for Patients:   https://www.patel.info/  Fact Sheet for Healthcare Providers: https://hall.com/  This test is not yet approved or  cleared by the [place] FDA and has been authorized for detection and/or diagnosis of SARS-CoV-2 by FDA under an Emergency Use Authorization (EUA).  This EUA will remain in effect (meaning this test can be used) for the duration of the COVID-19 declaration under Section 564(b)(1) of the Act, 21 U.S.C. section 360bbb-3(b)(1), unless the authorization is terminated  or revoked sooner.  Performed at [facility], [place] [address] [place], [place] [address]      Radiology Studies: No results found.   Scheduled Meds:  atorvastatin  20 mg Oral QHS   carvedilol  3.125 mg Oral BID WC   dapagliflozin propanediol  10 mg Oral Daily   enoxaparin (LOVENOX) injection  40 mg Subcutaneous Daily   ivabradine  5 mg Oral BID WC   midodrine  10 mg Oral BID WC   multivitamin with minerals  1 tablet Oral Daily   nicotine  21 mg Transdermal Daily   polyethylene glycol  17 g Oral BID   Continuous Infusions:   LOS: 7 days    Time spent: 19min   Domenic Polite, MD [facility]   03/19/2022, 10:32 AM

## 2022-03-20 ENCOUNTER — Other Ambulatory Visit (HOSPITAL_COMMUNITY): Payer: Self-pay

## 2022-03-20 LAB — COMPREHENSIVE METABOLIC PANEL
ALT: 15 U/L (ref 0–44)
AST: 21 U/L (ref 15–41)
Albumin: 2.5 g/dL — ABNORMAL LOW (ref 3.5–5.0)
Alkaline Phosphatase: 87 U/L (ref 38–126)
Anion gap: 11 (ref 5–15)
BUN: 25 mg/dL — ABNORMAL HIGH (ref 8–23)
CO2: 36 mmol/L — ABNORMAL HIGH (ref 22–32)
Calcium: 8.2 mg/dL — ABNORMAL LOW (ref 8.9–10.3)
Chloride: 97 mmol/L — ABNORMAL LOW (ref 98–111)
Creatinine, Ser: 1.14 mg/dL — ABNORMAL HIGH (ref 0.44–1.00)
GFR, Estimated: 51 mL/min — ABNORMAL LOW (ref >60)
Glucose, Bld: 91 mg/dL (ref 70–99)
Potassium: 3.8 mmol/L (ref 3.5–5.1)
Sodium: 144 mmol/L (ref 135–145)
Total Bilirubin: 0.9 mg/dL (ref 0.3–1.2)
Total Protein: 5.3 g/dL — ABNORMAL LOW (ref 6.5–8.1)

## 2022-03-20 LAB — CBC
HCT: 34.6 % — ABNORMAL LOW (ref 36.0–46.0)
Hemoglobin: 11.1 g/dL — ABNORMAL LOW (ref 12.0–15.0)
MCH: 27.1 pg (ref 26.0–34.0)
MCHC: 32.1 g/dL (ref 30.0–36.0)
MCV: 84.6 fL (ref 80.0–100.0)
Platelets: 154 10*3/uL (ref 150–400)
RBC: 4.09 MIL/uL (ref 3.87–5.11)
RDW: 18.7 % — ABNORMAL HIGH (ref 11.5–15.5)
WBC: 5.6 10*3/uL (ref 4.0–10.5)
nRBC: 0 % (ref 0.0–0.2)

## 2022-03-20 MED ORDER — MIDODRINE HCL 10 MG PO TABS
10.0000 mg | ORAL_TABLET | Freq: Two times a day (BID) | ORAL | 0 refills | Status: AC
  Filled 2022-03-20: qty 60, 30d supply, fill #0

## 2022-03-20 MED ORDER — POTASSIUM CHLORIDE CRYS ER 20 MEQ PO TBCR
40.0000 meq | EXTENDED_RELEASE_TABLET | Freq: Once | ORAL | Status: AC
  Administered 2022-03-20: 40 meq via ORAL
  Filled 2022-03-20: qty 2

## 2022-03-20 MED ORDER — MAGNESIUM SULFATE 2 GM/50ML IV SOLN
2.0000 g | Freq: Once | INTRAVENOUS | Status: AC
  Administered 2022-03-20: 2 g via INTRAVENOUS
  Filled 2022-03-20: qty 50

## 2022-03-20 MED ORDER — FUROSEMIDE 40 MG PO TABS
40.0000 mg | ORAL_TABLET | Freq: Every day | ORAL | Status: DC
  Administered 2022-03-20: 40 mg via ORAL
  Filled 2022-03-20: qty 1

## 2022-03-20 NOTE — TOC Progression Note (Signed)
Transition of Care [facility]) - Progression Note    Patient Details  Name: Valerie Rodgers MRN: [id number] Date of Birth: 05-Nov-1950  [facility] CM/SW [place], [place] Phone Number: 03/20/2022, 11:13 AM  Clinical Narrative:      Patient's friend [name] has concerns about patient returning to boarding home. Patient unable to get to her bathroom on the second floor and is defecating on herself. Patients friend has hygeine concerns for patient.Patients friend reports there are roaches in the home.Friend states that the patient stays on the 1st floor without a bathroom.  Patient is adamant that she is not going to a facility at discharge that she wants caregivers to come see her in her home. Patient adamant she can care for herself and does not need SNF. Patient reports she has neighbors that can help if needed.desired to return to the boarding house with home health at discharge.CSW made report to APS. TOC will continue to follow and assist with patients dc planning needs.  Expected Discharge Plan: [place] Barriers to Discharge: Continued Medical Work up  Expected Discharge Plan and Services Expected Discharge Plan: [place] In-house Referral: Clinical Social Work   Post Acute Care Choice: [place] arrangements for the past 2 months: [place] Determinants of Health (SDOH) Interventions    Readmission Risk Interventions    03/17/2022    2:55 PM  Readmission Risk Prevention Plan  HRI or Home Care Consult Complete  Social Work Consult for [place] Planning/Counseling Complete  Palliative Care Screening Not Applicable  Medication Review [employer]) Referral to Pharmacy

## 2022-03-20 NOTE — Discharge Summary (Signed)
Physician Discharge Summary  Valerie Rodgers SWF:[id number] DOB: July 10, 1950 DOA: 03/12/2022  PCP: Merryl Hacker, No  Admit date: 03/12/2022 Discharge date: 03/20/2022  Time spent: 45 minutes  Recommendations for Outpatient Follow-up:  PCP at [facility] on 10/9 Home health PT RN, social work Cardiology Dr. Einar Gip in 1 to 2 weeks, resume Aldactone at follow-up if blood pressure tolerates High risk of quick readmission   Discharge Diagnoses:  Principal Problem:   Acute on chronic systolic CHF (congestive heart failure) ([place]) Noncompliance Polysubstance, cocaine abuse   COPD (chronic obstructive pulmonary disease) (HCC)   Atrial flutter, paroxysmal (HCC)   Acute kidney injury superimposed on chronic kidney disease ([place])   Mixed hyperlipidemia   Essential hypertension   Decubitus ulcer of left buttock   Nicotine dependence, cigarettes, uncomplicated   Cocaine abuse [facility])   Discharge Condition: improved  Diet recommendation: Low sodium, heart healthy  Filed Weights   03/18/22 0345 03/19/22 0500 03/20/22 0625  Weight: 84.4 kg 79.3 kg 79.2 kg    History of present illness:  71 yo female with the past medical history of heart failure, atrial fibrillation, COPD, hypertension, CKD stage 3b, and polysubstance abuse, cocaine abuse who presented with edema and weakness. -In the ED labs noted creatinine of 1.5, BNP 3743, troponin 83, chest x-ray noted cardiomegaly, bilateral interstitial infiltrates and vascular congestion -Started on diuretics, limited by soft BP  Hospital Course:   Acute on chronic systolic CHF ) -Echo  with reduced LV systolic function with EF <20% with global hypokinesis, RV systolic function with severe reduction, RV moderate enlargement, moderate MR. Moderate to severe TR.  -Complicated by longstanding cocaine abuse, poor compliance with medications, extremely limited insight -She is 6.9 L negative, blood pressure is soft we will hold IV Lasix today -started on  midodrine by Dr. Cathlean Sauer earlier in the week -Sinus tachycardia and SVT noted, started carvedilol, BiDil on hold -continue empagliflozin -Followed by palliative care as well as outpatient   S DOH -Limited by polysubstance abuse/cocaine, poor compliance with meds, poor health literacy, TOC team was also called by patient's friend who reported that she cannot return to her boardinghouse as she is unable to get to her bathroom on the second floor and defecates on herself frequently -Continues to adamantly decline rehab or placement, exhibits poor judgment but still has capacity to make her decisions -Followed by [facility], APS report was filed, discharged back to her boardinghouse, home health services set up and PCP follow-up arranged   COPD/chronic respiratory failure on 2 L home O2 -Continue bronchodilator therapy, supplemental O2   SVT Continue with ivabradine  -Carvedilol resumed   Acute kidney injury on CKD stage 3b  hypomagnesemia  -Creatinine improving, avoid hypotension, monitor with diuresis   Essential hypertension -Meds as noted above   Decubitus ulcer of left buttock Chronic leg wounds -Present on admission, wound care following, continue dressing changes   Nicotine dependence, cigarettes, uncomplicated -Counseled   Cocaine abuse ([place]) Ongoing cocaine abuse is likely playing a major role in the patient's repeated bouts of acute heart failure Counseled at length again -TOC consulted, outpatient resources offered  Discharge Exam: Vitals:   03/20/22 1100 03/20/22 1200  BP:  104/69  Pulse:    Resp:  (!) 22  Temp:    SpO2: 94% 97%   General exam: Chronically ill disheveled female laying in bed, AAO x2, cognitive deficits, no distress HEENT: Positive JVD CVS: S1-S2, regular rhythm Lungs: Clear anteriorly, decreased at the bases Abdomen: Soft, nontender, bowel  sounds present  Extremities: Trace edema,wounds on both legs, left leg with dressing Skin: As above   Psychiatry: Extremely poor insight and judgment  Discharge Instructions    Allergies as of 03/20/2022   No Known Allergies      Medication List     STOP taking these medications    isosorbide-hydrALAZINE 20-37.5 MG tablet Commonly known as: BIDIL   spironolactone 25 MG tablet Commonly known as: ALDACTONE       TAKE these medications    atorvastatin 20 MG tablet Commonly known as: LIPITOR Take 1 tablet (20 mg total) by mouth at bedtime.   calcitRIOL 0.25 MCG capsule Commonly known as: ROCALTROL Take 0.25 mcg by mouth daily.   carvedilol 3.125 MG tablet Commonly known as: COREG Take 1 tablet (3.125 mg total) by mouth 2 (two) times daily with a meal.   Corlanor 5 MG Tabs tablet Generic drug: ivabradine Take 1 tablet (5 mg total) by mouth 2 (two) times daily with a meal.   Farxiga 10 MG Tabs tablet Generic drug: dapagliflozin propanediol Take 10 mg by mouth every morning.   furosemide 40 MG tablet Commonly known as: LASIX Take 40 mg by mouth daily.   midodrine 10 MG tablet Commonly known as: PROAMATINE Take 1 tablet (10 mg total) by mouth 2 (two) times daily with a meal.   multivitamin with minerals Tabs tablet Take 1 tablet by mouth daily.   potassium chloride 10 MEQ tablet Commonly known as: KLOR-CON Take 10 mEq by mouth daily.       No Known Allergies  Follow-up Information     [facility] Follow up.   Why: [facility] Contact information: [address] [place] [address] [phone]         Cipriano Mile, NP Follow up on 03/27/2022.   Why: [facility] PCP will see the patient at 1240 pm for hospital follow up appointmen. Office will provide the transportation and they will pick her up at 1220 from her address. Contact information: [place] [address] [phone]                  The results of significant diagnostics from this hospitalization (including imaging, microbiology,  ancillary and laboratory) are listed below for reference.    Significant Diagnostic Studies: DG Chest Port 1 View  Result Date: 03/12/2022 CLINICAL DATA:  Leg swelling EXAM: PORTABLE CHEST 1 VIEW COMPARISON:  11/13/2021 FINDINGS: Cardiomegaly with vascular congestion and bilateral airspace disease compatible with edema/CHF. No visible effusions or acute bony abnormality. IMPRESSION: Mild to moderate CHF Electronically Signed   By: Rolm Baptise M.D.   On: 03/12/2022 19:24    Microbiology: Recent Results (from the past 240 hour(s))  SARS Coronavirus 2 by RT PCR (hospital order, performed in [facility] hospital lab) *cepheid single result test* Anterior Nasal Swab     Status: None   Collection Time: 03/12/22 11:40 PM   Specimen: Anterior Nasal Swab  Result Value Ref Range Status   SARS Coronavirus 2 by RT PCR NEGATIVE NEGATIVE Final    Comment: (NOTE) SARS-CoV-2 target nucleic acids are NOT DETECTED.  The SARS-CoV-2 RNA is generally detectable in upper and lower respiratory specimens during the acute phase of infection. The lowest concentration of SARS-CoV-2 viral copies this assay can detect is 250 copies / mL. A negative result does not preclude SARS-CoV-2 infection and should not be used as the sole basis for treatment or other patient management decisions.  A negative  result may occur with improper specimen collection / handling, submission of specimen other than nasopharyngeal swab, presence of viral mutation(s) within the areas targeted by this assay, and inadequate number of viral copies (<250 copies / mL). A negative result must be combined with clinical observations, patient history, and epidemiological information.  Fact Sheet for Patients:   https://www.patel.info/  Fact Sheet for Healthcare Providers: https://hall.com/  This test is not yet approved or  cleared by the [place] FDA and has been authorized for detection and/or  diagnosis of SARS-CoV-2 by FDA under an Emergency Use Authorization (EUA).  This EUA will remain in effect (meaning this test can be used) for the duration of the COVID-19 declaration under Section 564(b)(1) of the Act, 21 U.S.C. section 360bbb-3(b)(1), unless the authorization is terminated or revoked sooner.  Performed at [facility], [place] [address] [place], [place] [address]      Labs: Basic Metabolic Panel: Recent Labs  Lab 03/14/22 0232 03/15/22 0321 03/16/22 0245 03/17/22 0147 03/18/22 0201 03/19/22 0115 03/19/22 0330 03/20/22 0343  NA 142 143 142 140 143 143  --  144  K 4.0 3.6 4.0 3.7 4.2 4.0  --  3.8  CL 106 104 102 96* 97* 98  --  97*  CO2 30 31 30  33* 37* 37*  --  36*  GLUCOSE 178* 117* 105* 130* 111* 95  --  91  BUN 35* 37* 32* 29* 27* 28*  --  25*  CREATININE 1.44* 1.42* 1.22* 1.10* 1.08* 1.15*  --  1.14*  CALCIUM 8.3* 8.5* 8.6* 8.7* 8.6* 8.2*  --  8.2*  MG 1.8 2.1  --  1.8 1.8  --  1.8  --    Liver Function Tests: Recent Labs  Lab 03/20/22 0343  AST 21  ALT 15  ALKPHOS 87  BILITOT 0.9  PROT 5.3*  ALBUMIN 2.5*   No results for input(s): "LIPASE", "AMYLASE" in the last 168 hours. No results for input(s): "AMMONIA" in the last 168 hours. CBC: Recent Labs  Lab 03/18/22 0201 03/20/22 0343  WBC 8.1 5.6  HGB 12.6 11.1*  HCT 39.1 34.6*  MCV 82.3 84.6  PLT 200 154   Cardiac Enzymes: No results for input(s): "CKTOTAL", "CKMB", "CKMBINDEX", "TROPONINI" in the last 168 hours. BNP: BNP (last 3 results) Recent Labs    11/13/21 1843 11/18/21 0417 03/12/22 1754  BNP 1,828.7* 958.7* 3,743.2*    ProBNP (last 3 results) No results for input(s): "PROBNP" in the last 8760 hours.  CBG: Recent Labs  Lab 03/17/22 0802 03/17/22 1607 03/18/22 0720  GLUCAP 103* 125* 103*       Signed:  Domenic Polite MD.  [facility] 03/20/2022, 12:01 PM

## 2022-03-20 NOTE — Progress Notes (Signed)
Reviewed d/c instructions with pt and pt's family, no diistress noted, pt eating lunch first before d/c, continues on O2 2l [place], pt will be transported on 2l [place]

## 2022-03-20 NOTE — TOC Progression Note (Signed)
Transition of Care [facility]) - Progression Note    Patient Details  Name: [name] MRN: [id number] Date of Birth: 22-Dec-1950  Transition of [facility]) CM/SW Contact  Graves-Bigelow, Ocie Cornfield, RN Phone Number: 03/20/2022, 11:51 AM  Clinical Narrative: Case Manager did reach out to [facility] and they can add RN, and CSW services to the PT order. MD is aware to place the additional orders. [facility] has been ordered for the patient via [employer] and delivered to the room. CSW placed the APS report to follow up outpatient. Patient states her friend will provide transportation home. Case Manager did schedule a hospital follow-up appointment for the patient with NP [facility]. Appointment has been placed on the AVS. No further needs identified from this Case Manager.   Expected Discharge Plan: [place] Barriers to Discharge: Continued Medical Work up  Expected Discharge Plan and Services Expected Discharge Plan: [place] In-house Referral: Clinical Social Work   Post Acute Care Choice: [place] arrangements for the past 2 months: [place]    Readmission Risk Interventions    03/17/2022    2:55 PM  Readmission Risk Prevention Plan  [place] or Home Care Consult Complete  Social Work Consult for [place] Planning/Counseling Complete  Palliative Care Screening Not Applicable  Medication Review Press photographer) Referral to Pharmacy

## 2022-03-20 NOTE — Progress Notes (Signed)
During the assessment that was completed on 03/18/2022 with the patient, CSW was not made aware of any safety concerns at home. Patient was clear that she is able to care for herself independently without issue and desired to return to the boarding house with home health at discharge.  Madilyn Fireman, MSW, LCSW Transitions of Care  Clinical Social Worker II [phone]

## 2022-03-20 NOTE — Progress Notes (Signed)
Physical Therapy Treatment Patient Details Name: Valerie Rodgers MRN: [id number] DOB: [birth year] Today's Date: 03/20/2022   History of Present Illness Pt is 71 year old presented to [facility] on  9/24 with acute on chronic CHF. PMH - afib, CHF, cocaine abuse, bipolar, CKD, cirrhosis of liver, COPD on chronic 2L O2.    PT Comments    Patient progressing slowly towards PT goals. Session focused on pericare/toileting and ambulation. Requires mod A to stand from all surfaces and Min A for gait training with use of RW for support. Pt with dyspnea on exertion with any activity needing frequent rest breaks. Sp02 dropped to mid 80s on RA with activity, donned 2-3L/min 02 [place] and able to maintain Sp02 in 90s. Pt eager to return home. Requires max assist for pericare and clean up in bathroom. Pt refusing SNF despite this being the recommendation of PT so discharge recommendation updated to HHPT. Pt with open wounds on bil shins anteriorly and posteriorly. RN in room to change bandage due to getting stool on other ones. Will follow.    Recommendations for follow up therapy are one component of a multi-disciplinary discharge planning process, led by the attending physician.  Recommendations may be updated based on patient status, additional functional criteria and insurance authorization.  Follow Up Recommendations  Home health PT (pt refusing SNF) Can patient physically be transported by private vehicle: Yes   Assistance Recommended at Discharge Frequent or constant Supervision/Assistance  Patient can return home with the following A lot of help with walking and/or transfers;A lot of help with bathing/dressing/bathroom;Direct supervision/assist for medications management;Assistance with cooking/housework;Assist for transportation;Help with stairs or ramp for entrance   Equipment Recommendations  None recommended by PT    Recommendations for Other Services       Precautions / Restrictions  Precautions Precautions: Fall;Other (comment) Precaution Comments: watch SpO2, baseline 2-4L Restrictions Weight Bearing Restrictions: No     Mobility  Bed Mobility Overal bed mobility: Needs Assistance Bed Mobility: Supine to Sit     Supine to sit: HOB elevated, Mod assist     General bed mobility comments: ASsist with LEs and trunk to get to EOB, step by step cues for sequencing as pt reaching for therapist's arms to pull her up.    Transfers Overall transfer level: Needs assistance Equipment used: Rolling walker (2 wheels) Transfers: Sit to/from Stand Sit to Stand: Mod assist           General transfer comment: mod A to power to standing with cues for hand placement/technique and use of momentum. Stood from [employer], from low toilet x3. Declined tx to chair.    Ambulation/Gait Ambulation/Gait assistance: Min assist Gait Distance (Feet): 18 Feet (x2 bouts) Assistive device: Rolling walker (2 wheels) Gait Pattern/deviations: Step-to pattern, Step-through pattern, Wide base of support, Trunk flexed, Decreased stride length Gait velocity: decreased     General Gait Details: Slow, mildly unsteady gait wtih wide BoS and use of RW, 1 seated rest break. 2/4 DOE noted. Sp02 dropped to mid 80s on RA, donned 3L.   Stairs             Wheelchair Mobility    Modified Rankin (Stroke Patients Only)       Balance Overall balance assessment: Needs assistance Sitting-balance support: No upper extremity supported, Feet supported Sitting balance-Leahy Scale: Fair     Standing balance support: During functional activity, Reliant on assistive device for balance, Bilateral upper extremity supported Standing balance-Leahy Scale: Poor  Cognition Arousal/Alertness: Awake/alert Behavior During Therapy: Impulsive Overall Cognitive Status: No family/caregiver present to determine baseline cognitive functioning                                  General Comments: POor attention, self distracting but able to be redirected, needs simple cues and repetition at times.        Exercises      General Comments General comments (skin integrity, edema, etc.): Increased WOB with any movement. Sp02 dropped to mid 80s on RA during session, donned 2-3L/min 02 [place].      Pertinent Vitals/Pain Pain Assessment Pain Assessment: Faces Faces Pain Scale: Hurts even more Pain Location: wounds on BLEs Pain Descriptors / Indicators: Sore, Tender Pain Intervention(s): Monitored during session, Repositioned    Home Living                          Prior Function            PT Goals (current goals can now be found in the care plan section) Progress towards PT goals: Progressing toward goals    Frequency    Min 3X/week      PT Plan Discharge plan needs to be updated    Co-evaluation              AM-PAC PT "6 Clicks" Mobility   Outcome Measure  Help needed turning from your back to your side while in a flat bed without using bedrails?: A Lot Help needed moving from lying on your back to sitting on the side of a flat bed without using bedrails?: A Lot Help needed moving to and from a bed to a chair (including a wheelchair)?: A Lot Help needed standing up from a chair using your arms (e.g., wheelchair or bedside chair)?: A Lot Help needed to walk in hospital room?: A Little Help needed climbing 3-5 steps with a railing? : Total 6 Click Score: 12    End of Session Equipment Utilized During Treatment: Oxygen;Gait belt Activity Tolerance: Patient limited by fatigue Patient left: in bed;with call bell/phone within reach;with bed alarm set;with nursing/sitter in room Nurse Communication: Mobility status PT Visit Diagnosis: Unsteadiness on feet (R26.81);Other abnormalities of gait and mobility (R26.89);Muscle weakness (generalized) (M62.81)     Time: 0272-5366 PT Time Calculation (min) (ACUTE  ONLY): 35 min  Charges:  $Gait Training: 8-22 mins $Therapeutic Activity: 8-22 mins                     Marisa Severin, PT, DPT Acute Rehabilitation Services Secure chat preferred Office [place] 03/20/2022, 12:36 PM

## 2022-03-25 ENCOUNTER — Inpatient Hospital Stay (HOSPITAL_COMMUNITY): Payer: Medicare Other

## 2022-03-25 ENCOUNTER — Inpatient Hospital Stay (HOSPITAL_COMMUNITY)
Admission: EM | Admit: 2022-03-25 | Discharge: 2022-04-06 | DRG: 871 | Disposition: A | Discharge status: Skilled Nursing Facility | Payer: Medicare Other | Attending: Internal Medicine | Admitting: Internal Medicine

## 2022-03-25 ENCOUNTER — Emergency Department (HOSPITAL_COMMUNITY): Payer: Medicare Other

## 2022-03-25 ENCOUNTER — Other Ambulatory Visit: Payer: Self-pay

## 2022-03-25 ENCOUNTER — Encounter (HOSPITAL_COMMUNITY): Payer: Self-pay | Admitting: Emergency Medicine

## 2022-03-25 DIAGNOSIS — J9622 Acute and chronic respiratory failure with hypercapnia: Secondary | ICD-10-CM | POA: Diagnosis present

## 2022-03-25 DIAGNOSIS — Z1152 Encounter for screening for COVID-19: Secondary | ICD-10-CM

## 2022-03-25 DIAGNOSIS — L97929 Non-pressure chronic ulcer of unspecified part of left lower leg with unspecified severity: Secondary | ICD-10-CM | POA: Diagnosis present

## 2022-03-25 DIAGNOSIS — I4892 Unspecified atrial flutter: Secondary | ICD-10-CM | POA: Diagnosis not present

## 2022-03-25 DIAGNOSIS — K7460 Unspecified cirrhosis of liver: Secondary | ICD-10-CM | POA: Diagnosis present

## 2022-03-25 DIAGNOSIS — J9621 Acute and chronic respiratory failure with hypoxia: Secondary | ICD-10-CM | POA: Diagnosis present

## 2022-03-25 DIAGNOSIS — G9340 Encephalopathy, unspecified: Secondary | ICD-10-CM | POA: Diagnosis present

## 2022-03-25 DIAGNOSIS — L0390 Cellulitis, unspecified: Secondary | ICD-10-CM | POA: Diagnosis present

## 2022-03-25 DIAGNOSIS — L03116 Cellulitis of left lower limb: Secondary | ICD-10-CM | POA: Diagnosis present

## 2022-03-25 DIAGNOSIS — L03119 Cellulitis of unspecified part of limb: Secondary | ICD-10-CM | POA: Diagnosis not present

## 2022-03-25 DIAGNOSIS — I5042 Chronic combined systolic (congestive) and diastolic (congestive) heart failure: Secondary | ICD-10-CM | POA: Diagnosis present

## 2022-03-25 DIAGNOSIS — Z66 Do not resuscitate: Secondary | ICD-10-CM | POA: Diagnosis present

## 2022-03-25 DIAGNOSIS — I5020 Unspecified systolic (congestive) heart failure: Secondary | ICD-10-CM | POA: Diagnosis present

## 2022-03-25 DIAGNOSIS — A400 Sepsis due to streptococcus, group A: Secondary | ICD-10-CM | POA: Diagnosis present

## 2022-03-25 DIAGNOSIS — E785 Hyperlipidemia, unspecified: Secondary | ICD-10-CM | POA: Diagnosis present

## 2022-03-25 DIAGNOSIS — R7989 Other specified abnormal findings of blood chemistry: Secondary | ICD-10-CM | POA: Diagnosis present

## 2022-03-25 DIAGNOSIS — G928 Other toxic encephalopathy: Secondary | ICD-10-CM | POA: Diagnosis present

## 2022-03-25 DIAGNOSIS — F17210 Nicotine dependence, cigarettes, uncomplicated: Secondary | ICD-10-CM | POA: Diagnosis present

## 2022-03-25 DIAGNOSIS — I959 Hypotension, unspecified: Secondary | ICD-10-CM | POA: Diagnosis not present

## 2022-03-25 DIAGNOSIS — F319 Bipolar disorder, unspecified: Secondary | ICD-10-CM | POA: Diagnosis present

## 2022-03-25 DIAGNOSIS — I130 Hypertensive heart and chronic kidney disease with heart failure and stage 1 through stage 4 chronic kidney disease, or unspecified chronic kidney disease: Secondary | ICD-10-CM | POA: Diagnosis present

## 2022-03-25 DIAGNOSIS — N1832 Chronic kidney disease, stage 3b: Secondary | ICD-10-CM | POA: Diagnosis present

## 2022-03-25 DIAGNOSIS — F1410 Cocaine abuse, uncomplicated: Secondary | ICD-10-CM | POA: Diagnosis present

## 2022-03-25 DIAGNOSIS — R7881 Bacteremia: Secondary | ICD-10-CM

## 2022-03-25 DIAGNOSIS — R6520 Severe sepsis without septic shock: Secondary | ICD-10-CM | POA: Diagnosis present

## 2022-03-25 DIAGNOSIS — I2489 Other forms of acute ischemic heart disease: Secondary | ICD-10-CM | POA: Diagnosis present

## 2022-03-25 DIAGNOSIS — E669 Obesity, unspecified: Secondary | ICD-10-CM | POA: Diagnosis present

## 2022-03-25 DIAGNOSIS — J849 Interstitial pulmonary disease, unspecified: Secondary | ICD-10-CM | POA: Diagnosis present

## 2022-03-25 DIAGNOSIS — M1990 Unspecified osteoarthritis, unspecified site: Secondary | ICD-10-CM | POA: Diagnosis present

## 2022-03-25 DIAGNOSIS — Z90710 Acquired absence of both cervix and uterus: Secondary | ICD-10-CM

## 2022-03-25 DIAGNOSIS — I872 Venous insufficiency (chronic) (peripheral): Secondary | ICD-10-CM | POA: Diagnosis present

## 2022-03-25 DIAGNOSIS — I480 Paroxysmal atrial fibrillation: Secondary | ICD-10-CM | POA: Diagnosis present

## 2022-03-25 DIAGNOSIS — Z6831 Body mass index (BMI) 31.0-31.9, adult: Secondary | ICD-10-CM

## 2022-03-25 DIAGNOSIS — Z79899 Other long term (current) drug therapy: Secondary | ICD-10-CM

## 2022-03-25 DIAGNOSIS — Z9981 Dependence on supplemental oxygen: Secondary | ICD-10-CM

## 2022-03-25 DIAGNOSIS — I878 Other specified disorders of veins: Secondary | ICD-10-CM | POA: Diagnosis present

## 2022-03-25 DIAGNOSIS — A419 Sepsis, unspecified organism: Secondary | ICD-10-CM | POA: Diagnosis not present

## 2022-03-25 DIAGNOSIS — K8050 Calculus of bile duct without cholangitis or cholecystitis without obstruction: Secondary | ICD-10-CM | POA: Diagnosis present

## 2022-03-25 DIAGNOSIS — N1831 Chronic kidney disease, stage 3a: Secondary | ICD-10-CM | POA: Diagnosis present

## 2022-03-25 DIAGNOSIS — L89329 Pressure ulcer of left buttock, unspecified stage: Secondary | ICD-10-CM | POA: Diagnosis present

## 2022-03-25 DIAGNOSIS — L89322 Pressure ulcer of left buttock, stage 2: Secondary | ICD-10-CM | POA: Diagnosis present

## 2022-03-25 DIAGNOSIS — J441 Chronic obstructive pulmonary disease with (acute) exacerbation: Secondary | ICD-10-CM | POA: Diagnosis present

## 2022-03-25 DIAGNOSIS — R7401 Elevation of levels of liver transaminase levels: Secondary | ICD-10-CM | POA: Diagnosis not present

## 2022-03-25 DIAGNOSIS — R400 Somnolence: Secondary | ICD-10-CM | POA: Diagnosis not present

## 2022-03-25 DIAGNOSIS — L03115 Cellulitis of right lower limb: Secondary | ICD-10-CM | POA: Diagnosis present

## 2022-03-25 DIAGNOSIS — R4182 Altered mental status, unspecified: Secondary | ICD-10-CM | POA: Diagnosis not present

## 2022-03-25 LAB — COMPREHENSIVE METABOLIC PANEL
ALT: 47 U/L — ABNORMAL HIGH (ref 0–44)
AST: 61 U/L — ABNORMAL HIGH (ref 15–41)
Albumin: 2.9 g/dL — ABNORMAL LOW (ref 3.5–5.0)
Alkaline Phosphatase: 125 U/L (ref 38–126)
Anion gap: 12 (ref 5–15)
BUN: 24 mg/dL — ABNORMAL HIGH (ref 8–23)
CO2: 28 mmol/L (ref 22–32)
Calcium: 8.2 mg/dL — ABNORMAL LOW (ref 8.9–10.3)
Chloride: 104 mmol/L (ref 98–111)
Creatinine, Ser: 1.16 mg/dL — ABNORMAL HIGH (ref 0.44–1.00)
GFR, Estimated: 50 mL/min — ABNORMAL LOW (ref >60)
Glucose, Bld: 84 mg/dL (ref 70–99)
Potassium: 4.2 mmol/L (ref 3.5–5.1)
Sodium: 144 mmol/L (ref 135–145)
Total Bilirubin: 1.5 mg/dL — ABNORMAL HIGH (ref 0.3–1.2)
Total Protein: 6.6 g/dL (ref 6.5–8.1)

## 2022-03-25 LAB — I-STAT ARTERIAL BLOOD GAS, ED
Acid-Base Excess: 8 mmol/L — ABNORMAL HIGH (ref 0.0–2.0)
Acid-Base Excess: 7 mmol/L — ABNORMAL HIGH (ref 0.0–2.0)
Bicarbonate: 36.3 mmol/L — ABNORMAL HIGH (ref 20.0–28.0)
Bicarbonate: 34.3 mmol/L — ABNORMAL HIGH (ref 20.0–28.0)
Calcium, Ion: 1.1 mmol/L — ABNORMAL LOW (ref 1.15–1.40)
Calcium, Ion: 1.11 mmol/L — ABNORMAL LOW (ref 1.15–1.40)
HCT: 41 % (ref 36.0–46.0)
HCT: 39 % (ref 36.0–46.0)
Hemoglobin: 13.9 g/dL (ref 12.0–15.0)
Hemoglobin: 13.3 g/dL (ref 12.0–15.0)
O2 Saturation: 36 %
O2 Saturation: 100 %
Patient temperature: 98.1
Patient temperature: 98.1
Potassium: 3.6 mmol/L (ref 3.5–5.1)
Potassium: 3.7 mmol/L (ref 3.5–5.1)
Sodium: 144 mmol/L (ref 135–145)
Sodium: 142 mmol/L (ref 135–145)
TCO2: 38 mmol/L — ABNORMAL HIGH (ref 22–32)
TCO2: 36 mmol/L — ABNORMAL HIGH (ref 22–32)
pCO2 arterial: 68.3 mmHg (ref 32–48)
pCO2 arterial: 58.2 mmHg — ABNORMAL HIGH (ref 32–48)
pH, Arterial: 7.331 — ABNORMAL LOW (ref 7.35–7.45)
pH, Arterial: 7.377 (ref 7.35–7.45)
pO2, Arterial: 23 mmHg — CL (ref 83–108)
pO2, Arterial: 224 mmHg — ABNORMAL HIGH (ref 83–108)

## 2022-03-25 LAB — CBC WITH DIFFERENTIAL/PLATELET
Abs Immature Granulocytes: 0.18 10*3/uL — ABNORMAL HIGH (ref 0.00–0.07)
Basophils Absolute: 0 10*3/uL (ref 0.0–0.1)
Basophils Relative: 0 %
Eosinophils Absolute: 0 10*3/uL (ref 0.0–0.5)
Eosinophils Relative: 0 %
HCT: 39.8 % (ref 36.0–46.0)
Hemoglobin: 12.3 g/dL (ref 12.0–15.0)
Immature Granulocytes: 1 %
Lymphocytes Relative: 3 %
Lymphs Abs: 0.6 10*3/uL — ABNORMAL LOW (ref 0.7–4.0)
MCH: 27.4 pg (ref 26.0–34.0)
MCHC: 30.9 g/dL (ref 30.0–36.0)
MCV: 88.6 fL (ref 80.0–100.0)
Monocytes Absolute: 1.5 10*3/uL — ABNORMAL HIGH (ref 0.1–1.0)
Monocytes Relative: 8 %
Neutro Abs: 17 10*3/uL — ABNORMAL HIGH (ref 1.7–7.7)
Neutrophils Relative %: 88 %
Platelets: UNDETERMINED 10*3/uL (ref 150–400)
RBC: 4.49 MIL/uL (ref 3.87–5.11)
RDW: 19.9 % — ABNORMAL HIGH (ref 11.5–15.5)
WBC: 19.2 10*3/uL — ABNORMAL HIGH (ref 4.0–10.5)
nRBC: 0 % (ref 0.0–0.2)

## 2022-03-25 LAB — BRAIN NATRIURETIC PEPTIDE: B Natriuretic Peptide: 1852.5 pg/mL — ABNORMAL HIGH (ref 0.0–100.0)

## 2022-03-25 LAB — MAGNESIUM: Magnesium: 1.9 mg/dL (ref 1.7–2.4)

## 2022-03-25 LAB — LACTIC ACID, PLASMA
Lactic Acid, Venous: 2.9 mmol/L (ref 0.5–1.9)
Lactic Acid, Venous: 2 mmol/L (ref 0.5–1.9)

## 2022-03-25 LAB — SARS CORONAVIRUS 2 BY RT PCR: SARS Coronavirus 2 by RT PCR: NEGATIVE

## 2022-03-25 LAB — TROPONIN I (HIGH SENSITIVITY)
Troponin I (High Sensitivity): 272 ng/L (ref <18)
Troponin I (High Sensitivity): 412 ng/L (ref <18)

## 2022-03-25 MED ORDER — VANCOMYCIN HCL 1750 MG/350ML IV SOLN
1750.0000 mg | Freq: Once | INTRAVENOUS | Status: DC
  Administered 2022-03-25: 1750 mg via INTRAVENOUS
  Filled 2022-03-25: qty 350

## 2022-03-25 MED ORDER — SODIUM CHLORIDE 0.9% FLUSH
3.0000 mL | Freq: Two times a day (BID) | INTRAVENOUS | Status: DC
  Administered 2022-03-25 – 2022-04-06 (×20): 3 mL via INTRAVENOUS

## 2022-03-25 MED ORDER — ATORVASTATIN CALCIUM 10 MG PO TABS
20.0000 mg | ORAL_TABLET | Freq: Every day | ORAL | Status: DC
  Administered 2022-03-26 – 2022-03-29 (×4): 20 mg via ORAL
  Filled 2022-03-25 (×4): qty 2

## 2022-03-25 MED ORDER — ACETAMINOPHEN 650 MG RE SUPP: 650.0000 mg | Freq: Four times a day (QID) | RECTAL | Status: DC | PRN

## 2022-03-25 MED ORDER — IPRATROPIUM-ALBUTEROL 0.5-2.5 (3) MG/3ML IN SOLN
3.0000 mL | Freq: Four times a day (QID) | RESPIRATORY_TRACT | Status: DC
  Administered 2022-03-25 – 2022-03-26 (×2): 3 mL via RESPIRATORY_TRACT
  Filled 2022-03-25 (×2): qty 3

## 2022-03-25 MED ORDER — LACTATED RINGERS IV SOLN: INTRAVENOUS | Status: DC

## 2022-03-25 MED ORDER — VANCOMYCIN HCL IN DEXTROSE 1-5 GM/200ML-% IV SOLN: 1000.0000 mg | Freq: Once | INTRAVENOUS | Status: DC

## 2022-03-25 MED ORDER — CARVEDILOL 3.125 MG PO TABS
3.1250 mg | ORAL_TABLET | Freq: Two times a day (BID) | ORAL | Status: DC
  Administered 2022-03-26 – 2022-03-30 (×8): 3.125 mg via ORAL
  Filled 2022-03-25 (×8): qty 1

## 2022-03-25 MED ORDER — SODIUM CHLORIDE 0.9 % IV SOLN: 1.0000 g | INTRAVENOUS | Status: DC

## 2022-03-25 MED ORDER — SODIUM CHLORIDE 0.9 % IV BOLUS
1000.0000 mL | Freq: Once | INTRAVENOUS | Status: AC
  Administered 2022-03-25: 1000 mL via INTRAVENOUS

## 2022-03-25 MED ORDER — SODIUM CHLORIDE 0.9 % IV SOLN
2.0000 g | Freq: Once | INTRAVENOUS | Status: AC
  Administered 2022-03-25: 2 g via INTRAVENOUS
  Filled 2022-03-25: qty 20

## 2022-03-25 MED ORDER — ONDANSETRON HCL 4 MG PO TABS: 4.0000 mg | ORAL_TABLET | Freq: Four times a day (QID) | ORAL | Status: DC | PRN

## 2022-03-25 MED ORDER — METHYLPREDNISOLONE SODIUM SUCC 125 MG IJ SOLR
125.0000 mg | Freq: Two times a day (BID) | INTRAMUSCULAR | Status: DC
  Administered 2022-03-25 – 2022-03-26 (×3): 125 mg via INTRAVENOUS
  Filled 2022-03-25 (×3): qty 2

## 2022-03-25 MED ORDER — VANCOMYCIN HCL 1250 MG/250ML IV SOLN: 1250.0000 mg | INTRAVENOUS | Status: DC

## 2022-03-25 MED ORDER — ALBUTEROL SULFATE (2.5 MG/3ML) 0.083% IN NEBU
2.5000 mg | INHALATION_SOLUTION | RESPIRATORY_TRACT | Status: DC | PRN
  Administered 2022-03-30: 2.5 mg via RESPIRATORY_TRACT
  Filled 2022-03-25: qty 3

## 2022-03-25 MED ORDER — IVABRADINE HCL 5 MG PO TABS
5.0000 mg | ORAL_TABLET | Freq: Two times a day (BID) | ORAL | Status: DC
  Administered 2022-03-26 – 2022-03-30 (×9): 5 mg via ORAL
  Filled 2022-03-25 (×11): qty 1

## 2022-03-25 MED ORDER — FUROSEMIDE 40 MG PO TABS
40.0000 mg | ORAL_TABLET | Freq: Every day | ORAL | Status: DC
  Administered 2022-03-26: 40 mg via ORAL
  Filled 2022-03-25: qty 1

## 2022-03-25 MED ORDER — ONDANSETRON HCL 4 MG/2ML IJ SOLN: 4.0000 mg | Freq: Four times a day (QID) | INTRAMUSCULAR | Status: DC | PRN

## 2022-03-25 MED ORDER — ACETAMINOPHEN 325 MG PO TABS
650.0000 mg | ORAL_TABLET | Freq: Four times a day (QID) | ORAL | Status: DC | PRN
  Administered 2022-03-29: 650 mg via ORAL
  Filled 2022-03-25: qty 2

## 2022-03-25 MED ORDER — VANCOMYCIN HCL IN DEXTROSE 1-5 GM/200ML-% IV SOLN: 1000.0000 mg | INTRAVENOUS | Status: DC

## 2022-03-25 MED ORDER — SENNOSIDES-DOCUSATE SODIUM 8.6-50 MG PO TABS
1.0000 | ORAL_TABLET | Freq: Every evening | ORAL | Status: DC | PRN
  Administered 2022-03-26: 1 via ORAL
  Filled 2022-03-25: qty 1

## 2022-03-25 NOTE — ED Notes (Signed)
Patient returned to room from Ultrasound.  

## 2022-03-25 NOTE — H&P (Signed)
History and Physical    Valerie Rodgers [id number] DOB: Jan 24, 1951 DOA: 03/25/2022  PCP: Pcp, No   Patient coming from: Home   Chief Complaint: Tremor   HPI: Valerie Rodgers is a pleasant 71 y.o. female with medical history significant for COPD, chronic hypoxic respiratory failure, chronic combined systolic and diastolic CHF, substance abuse, chronic wounds, and atrial fibrillation not on anticoagulation who presents with tremor and somnolence.  Patient had reportedly not been out of bed today at her boardinghouse, was noted to have a new tremor, and EMS was called.  She is somnolent in the ED but can be woken and will answer a few basic questions before returning to sleep.  She denies chest pain, fever, chills, or abdominal pain.  ED Course: Upon arrival to the ED, patient is found to be afebrile, saturating well on 5 L/min of supplemental oxygen, tachypneic, and with systolic blood pressure of 99 and greater.  EKG features atrial fibrillation with PVC.  Chest x-ray with stable cardiomegaly and chronic interstitial lung disease.  Amster panel notable for creatinine 1.16 and mild elevation in transaminases and bilirubin.  CBC notable for leukocytosis to 19,200 and clumped platelets.  Troponin is 272 and BNP 1853.  Blood cultures were collected in the ED and the patient was given 1 L of normal saline, vancomycin, and Rocephin.  Review of Systems:  History limited by patient's clinical condition.  Past Medical History:  Diagnosis Date   Arthritis    Bipolar affective disorder ([place])    Cataract     Past Surgical History:  Procedure Laterality Date   ABDOMINAL HYSTERECTOMY      Social History:   reports that she has been smoking cigarettes. She has a 16.25 pack-year smoking history. She has never used smokeless tobacco. She reports current alcohol use. She reports current drug use. Drugs: Cocaine and Marijuana.  No Known Allergies  Family History  Problem Relation Age of Onset    Stroke Father    Colon cancer Neg Hx    Heart disease Neg Hx      Prior to Admission medications   Medication Sig Start Date End Date Taking? Authorizing Provider  atorvastatin (LIPITOR) 20 MG tablet Take 1 tablet (20 mg total) by mouth at bedtime. 11/18/21 12/18/21  Arrien, Jimmy Picket, MD  calcitRIOL (ROCALTROL) 0.25 MCG capsule Take 0.25 mcg by mouth daily. 01/13/22   [provider]  carvedilol (COREG) 3.125 MG tablet Take 1 tablet (3.125 mg total) by mouth 2 (two) times daily with a meal. 11/18/21 12/18/21  Arrien, Jimmy Picket, MD  FARXIGA 10 MG TABS tablet Take 10 mg by mouth every morning. 01/12/22   [provider]  furosemide (LASIX) 40 MG tablet Take 40 mg by mouth daily. 03/05/22   [provider]  ivabradine (CORLANOR) 5 MG TABS tablet Take 1 tablet (5 mg total) by mouth 2 (two) times daily with a meal. 11/18/21   Arrien, Jimmy Picket, MD  midodrine (PROAMATINE) 10 MG tablet Take 1 tablet (10 mg total) by mouth 2 (two) times daily with a meal. 03/20/22   Domenic Polite, MD  Multiple Vitamin (MULTIVITAMIN WITH MINERALS) TABS tablet Take 1 tablet by mouth daily. 12/05/21   Reed, Tiffany L, DO  potassium chloride (KLOR-CON) 10 MEQ tablet Take 10 mEq by mouth daily. 03/05/22   [provider]    Physical Exam: Vitals:   03/25/22 1700 03/25/22 1844 03/25/22 1900 03/25/22 2020  BP: 118/76 117/82 99/72 113/89  Pulse:  94 92 91  Resp: 17 16 16  (!) 21  Temp:    99.3 F (37.4 C)  TempSrc:    Rectal  SpO2: 98% 98%  100%  Weight:      Height:        Constitutional: NAD, no pallor or diaphoresis  Eyes: PERTLA, lids and conjunctivae normal ENMT: Mucous membranes are moist. Posterior pharynx clear of any exudate or lesions.   Neck: supple, no masses  Respiratory: Labored respirations with prolonged expiratory phase. Wheezing.  Cardiovascular: Rate ~100 and irregularly irregular. No significant JVD. Abdomen: No distension, soft, tender in RUQ  without rebound pain or guarding. Bowel sounds active.  Musculoskeletal: no clubbing / cyanosis. No joint deformity upper and lower extremities.   Skin: Superficial lower leg wounds b/l with serosanguineous drainage and surrounding erythema and heat; no crepitus or fluctuance. Warm, dry, well-perfused. Neurologic: CN 2-12 grossly intact. Sensation to light touch intact. Strength 5/5 in all 4 limbs. Sleeping, wakes to loud voice or pain and oriented to person and place.  Psychiatric: Pleasant. Cooperative.    Labs and Imaging on Admission: I have personally reviewed following labs and imaging studies  CBC: Recent Labs  Lab 03/20/22 0343 03/25/22 1733 03/25/22 1824 03/25/22 1858  WBC 5.6 19.2*  --   --   NEUTROABS  --  17.0*  --   --   HGB 11.1* 12.3 13.9 13.3  HCT 34.6* 39.8 41.0 39.0  MCV 84.6 88.6  --   --   PLT 154 PLATELET CLUMPS NOTED ON SMEAR, UNABLE TO ESTIMATE  --   --    Basic Metabolic Panel: Recent Labs  Lab 03/19/22 0115 03/19/22 0330 03/20/22 0343 03/25/22 1733 03/25/22 1824 03/25/22 1858  NA 143  --  144 144 144 142  K 4.0  --  3.8 4.2 3.6 3.7  CL 98  --  97* 104  --   --   CO2 37*  --  36* 28  --   --   GLUCOSE 95  --  91 84  --   --   BUN 28*  --  25* 24*  --   --   CREATININE 1.15*  --  1.14* 1.16*  --   --   CALCIUM 8.2*  --  8.2* 8.2*  --   --   MG  --  1.8  --  1.9  --   --    GFR: Estimated Creatinine Clearance: 44.6 mL/min (A) (by C-G formula based on SCr of 1.16 mg/dL (H)). Liver Function Tests: Recent Labs  Lab 03/20/22 0343 03/25/22 1733  AST 21 61*  ALT 15 47*  ALKPHOS 87 125  BILITOT 0.9 1.5*  PROT 5.3* 6.6  ALBUMIN 2.5* 2.9*   No results for input(s): "LIPASE", "AMYLASE" in the last 168 hours. No results for input(s): "AMMONIA" in the last 168 hours. Coagulation Profile: No results for input(s): "INR", "PROTIME" in the last 168 hours. Cardiac Enzymes: No results for input(s): "CKTOTAL", "CKMB", "CKMBINDEX", "TROPONINI" in the  last 168 hours. BNP (last 3 results) No results for input(s): "PROBNP" in the last 8760 hours. HbA1C: No results for input(s): "HGBA1C" in the last 72 hours. CBG: No results for input(s): "GLUCAP" in the last 168 hours. Lipid Profile: No results for input(s): "CHOL", "HDL", "LDLCALC", "TRIG", "CHOLHDL", "LDLDIRECT" in the last 72 hours. Thyroid Function Tests: No results for input(s): "TSH", "T4TOTAL", "FREET4", "T3FREE", "THYROIDAB" in the last 72 hours. Anemia Panel: No results for input(s): "VITAMINB12", "FOLATE", "FERRITIN", "  TIBC", "IRON", "RETICCTPCT" in the last 72 hours. Urine analysis:    Component Value Date/Time   COLORURINE STRAW (A) 11/14/2021 0458   APPEARANCEUR CLEAR 11/14/2021 0458   LABSPEC 1.006 11/14/2021 0458   PHURINE 5.0 11/14/2021 0458   GLUCOSEU NEGATIVE 11/14/2021 0458   HGBUR NEGATIVE 11/14/2021 0458   BILIRUBINUR NEGATIVE 11/14/2021 0458   KETONESUR NEGATIVE 11/14/2021 0458   PROTEINUR NEGATIVE 11/14/2021 0458   NITRITE NEGATIVE 11/14/2021 0458   LEUKOCYTESUR NEGATIVE 11/14/2021 0458   Sepsis Labs: @LABRCNTIP (procalcitonin:4,lacticidven:4) ) Recent Results (from the past 240 hour(s))  SARS Coronavirus 2 by RT PCR (hospital order, performed in [facility] hospital lab) *cepheid single result test* Anterior Nasal Swab     Status: None   Collection Time: 03/25/22  5:42 PM   Specimen: Anterior Nasal Swab  Result Value Ref Range Status   SARS Coronavirus 2 by RT PCR NEGATIVE NEGATIVE Final    Comment: (NOTE) SARS-CoV-2 target nucleic acids are NOT DETECTED.  The SARS-CoV-2 RNA is generally detectable in upper and lower respiratory specimens during the acute phase of infection. The lowest concentration of SARS-CoV-2 viral copies this assay can detect is 250 copies / mL. A negative result does not preclude SARS-CoV-2 infection and should not be used as the sole basis for treatment or other patient management decisions.  A negative result may occur  with improper specimen collection / handling, submission of specimen other than nasopharyngeal swab, presence of viral mutation(s) within the areas targeted by this assay, and inadequate number of viral copies (<250 copies / mL). A negative result must be combined with clinical observations, patient history, and epidemiological information.  Fact Sheet for Patients:   https://www.patel.info/  Fact Sheet for Healthcare Providers: https://hall.com/  This test is not yet approved or  cleared by the [place] FDA and has been authorized for detection and/or diagnosis of SARS-CoV-2 by FDA under an Emergency Use Authorization (EUA).  This EUA will remain in effect (meaning this test can be used) for the duration of the COVID-19 declaration under Section 564(b)(1) of the Act, 21 U.S.C. section 360bbb-3(b)(1), unless the authorization is terminated or revoked sooner.  Performed at [facility], [place] [address] [place], [place] [address]      Radiological Exams on Admission: DG Chest Port 1 View  Result Date: 03/25/2022 CLINICAL DATA:  Tremors. EXAM: PORTABLE CHEST 1 VIEW COMPARISON:  March 12, 2022 FINDINGS: Stable moderate to marked severity cardiac silhouette enlargement is noted. Stable, mild to moderate severity diffusely increased interstitial lung markings are seen. Mild atelectasis and/or infiltrate is suspected along the infrahilar region on the left. There is no evidence of a pleural effusion or pneumothorax. The visualized skeletal structures are unremarkable. IMPRESSION: 1. Stable moderate to marked severity cardiac silhouette enlargement. 2. Chronic interstitial lung disease with mild left infrahilar atelectasis and/or infiltrate. Electronically Signed   By: Virgina Norfolk M.D.   On: 03/25/2022 18:54    EKG: Independently reviewed. Atrial fibrillation, PVC.   Assessment/Plan   1. COPD exacerbation; acute on chronic  hypoxic and hypercarbic respiratory failure  - Presents with somnolence and tremor and found to be hypoxic and hypercarbic with tachypnea, labored breathing, and wheezes  - Check sputum culture, start systemic steroids and antibiotic, schedule duonebs and use additional albuterol as needed, use BiPAP for now    2. Cellulitis  - She has chronic leg wounds and appears to have secondary cellulitis without fluctuance or crepitus  - Blood cultures were collected in ED and  antibiotics started  - Treat with Rocephin, continue wound care, follow cultures and clinical course    3. Elevated LFTs  - She has mild elevation in transaminases and bilirubin  - There is mild RUQ tenderness on exam  - Check viral hepatitis panel and RUQ [place], trend LFTs    4. Chronic combined systolic & diastolic CHF  - EF was <59% with grade 3 diastolic dysfunction in May 2023  - She appears to be compensated, wt is down >5 kg from time of recent admission for acute CHF  - Continue Lasix and Coreg, monitor wt and I/Os    5. CKD IIIa  - SCR is 1.16 on admission, appears close to baseline  - Renally-dose medications, monitor   6. Elevated troponin  - Troponin is elevated 272 in ED without chest pain  - Continue cardiac monitoring, trend troponin, continue beta-blocker, check echocardiogram    7. Acute encephalopathy  - Somnolent in ED, wakes to painful stimuli and answers some basic questions before quickly returning to sleep - No focal neurologic deficits identified  - She is hypercarbic and hx of substance abuse  - Ammonia level is pending  - Start BiPAP, check UDS, follow-up ammonia level    8. PAF  - Not anticoagulated, continue Coreg    DVT prophylaxis: SCDs  Code Status: DNR  Level of Care: Level of care: Progressive Family Communication: None present  Disposition Plan: Patient is from: home  Anticipated d/c is to: TBD Anticipated d/c date is: 03/29/22  Patient currently: Pending improved respiratory  status and mental status, repeat troponin, echocardiogram, transition to oral medications  Consults called: none  Admission status: Inpatient     Vianne Bulls, MD [facility]  03/25/2022, 9:27 PM

## 2022-03-25 NOTE — ED Provider Notes (Signed)
[facility] EMERGENCY DEPARTMENT Provider Note   CSN: [id number] Arrival date & time: 03/25/22  1511     History  Chief Complaint  Patient presents with   Tremors    Valerie Rodgers is a 71 y.o. female.  Patient brought to the emergency department by [facility].  EMS reported they were called to patient's residence because she was having tremors.  They reported that patient has not been out of bed today.  Patient lives at a boarding house.  Patient has a past medical history of congestive heart failure and COPD.  Patient is noted to have weeping sores to bilateral lower legs.  Patient is unable to assist in history she is awake and responds but is unable to provide history        Home Medications Prior to Admission medications   Medication Sig Start Date End Date Taking? Authorizing Provider  atorvastatin (LIPITOR) 20 MG tablet Take 1 tablet (20 mg total) by mouth at bedtime. 11/18/21 12/18/21  Arrien, Jimmy Picket, MD  calcitRIOL (ROCALTROL) 0.25 MCG capsule Take 0.25 mcg by mouth daily. 01/13/22   [provider]  carvedilol (COREG) 3.125 MG tablet Take 1 tablet (3.125 mg total) by mouth 2 (two) times daily with a meal. 11/18/21 12/18/21  Arrien, Jimmy Picket, MD  FARXIGA 10 MG TABS tablet Take 10 mg by mouth every morning. 01/12/22   [provider]  furosemide (LASIX) 40 MG tablet Take 40 mg by mouth daily. 03/05/22   [provider]  ivabradine (CORLANOR) 5 MG TABS tablet Take 1 tablet (5 mg total) by mouth 2 (two) times daily with a meal. 11/18/21   Arrien, Jimmy Picket, MD  midodrine (PROAMATINE) 10 MG tablet Take 1 tablet (10 mg total) by mouth 2 (two) times daily with a meal. 03/20/22   Domenic Polite, MD  Multiple Vitamin (MULTIVITAMIN WITH MINERALS) TABS tablet Take 1 tablet by mouth daily. 12/05/21   Reed, Tiffany L, DO  potassium chloride (KLOR-CON) 10 MEQ tablet Take 10 mEq by mouth daily. 03/05/22   [provider]      Allergies    Patient has no known allergies.    Review of Systems   Review of Systems  Unable to perform ROS: Mental status change  All other systems reviewed and are negative.   Physical Exam Updated Vital Signs BP 113/89 (BP Location: Left Arm)   Pulse 91   Temp 99.3 F (37.4 C) (Rectal)   Resp (!) 21   Ht 5\' 4"  (1.626 m)   Wt 79 kg   SpO2 100%   BMI 29.90 kg/m  Physical Exam Vitals and nursing note reviewed.  Constitutional:      Appearance: She is well-developed.     Comments: Patient is alert but not oriented.  She falls asleep during history  HENT:     Head: Normocephalic.     Right Ear: External ear normal.     Left Ear: External ear normal.     Nose: Nose normal.     Mouth/Throat:     Mouth: Mucous membranes are dry.  Cardiovascular:     Rate and Rhythm: Normal rate and regular rhythm.  Pulmonary:     Effort: Pulmonary effort is normal.  Abdominal:     General: There is no distension.  Musculoskeletal:        General: Swelling and tenderness present.     Cervical back: Normal range of motion.     Comments:  Open red sores bilateral lower extremities below the knees  Skin:    Findings: Erythema present.  Neurological:     Mental Status: She is alert and oriented to person, place, and time.     ED Results / Procedures / Treatments   Labs (all labs ordered are listed, but only abnormal results are displayed) Labs Reviewed  CBC WITH DIFFERENTIAL/PLATELET - Abnormal; Notable for the following components:      Result Value   WBC 19.2 (*)    RDW 19.9 (*)    Neutro Abs 17.0 (*)    Lymphs Abs 0.6 (*)    Monocytes Absolute 1.5 (*)    Abs Immature Granulocytes 0.18 (*)    All other components within normal limits  COMPREHENSIVE METABOLIC PANEL - Abnormal; Notable for the following components:   BUN 24 (*)    Creatinine, Ser 1.16 (*)    Calcium 8.2 (*)    Albumin 2.9 (*)    AST 61 (*)    ALT 47 (*)    Total Bilirubin 1.5 (*)     GFR, Estimated 50 (*)    All other components within normal limits  BRAIN NATRIURETIC PEPTIDE - Abnormal; Notable for the following components:   B Natriuretic Peptide 1,852.5 (*)    All other components within normal limits  LACTIC ACID, PLASMA - Abnormal; Notable for the following components:   Lactic Acid, Venous 2.9 (*)    All other components within normal limits  I-STAT ARTERIAL BLOOD GAS, ED - Abnormal; Notable for the following components:   pH, Arterial 7.331 (*)    pCO2 arterial 68.3 (*)    pO2, Arterial 23 (*)    Bicarbonate 36.3 (*)    TCO2 38 (*)    Acid-Base Excess 8.0 (*)    Calcium, Ion 1.10 (*)    All other components within normal limits  I-STAT ARTERIAL BLOOD GAS, ED - Abnormal; Notable for the following components:   pCO2 arterial 58.2 (*)    pO2, Arterial 224 (*)    Bicarbonate 34.3 (*)    TCO2 36 (*)    Acid-Base Excess 7.0 (*)    Calcium, Ion 1.11 (*)    All other components within normal limits  TROPONIN I (HIGH SENSITIVITY) - Abnormal; Notable for the following components:   Troponin I (High Sensitivity) 272 (*)    All other components within normal limits  SARS CORONAVIRUS 2 BY RT PCR  CULTURE, BLOOD (ROUTINE X 2)  CULTURE, BLOOD (ROUTINE X 2)  MAGNESIUM  URINALYSIS, ROUTINE W REFLEX MICROSCOPIC  RAPID URINE DRUG SCREEN, HOSP PERFORMED  ETHANOL  AMMONIA  LACTIC ACID, PLASMA  TROPONIN I (HIGH SENSITIVITY)    EKG None  Radiology DG Chest Port 1 View  Result Date: 03/25/2022 CLINICAL DATA:  Tremors. EXAM: PORTABLE CHEST 1 VIEW COMPARISON:  March 12, 2022 FINDINGS: Stable moderate to marked severity cardiac silhouette enlargement is noted. Stable, mild to moderate severity diffusely increased interstitial lung markings are seen. Mild atelectasis and/or infiltrate is suspected along the infrahilar region on the left. There is no evidence of a pleural effusion or pneumothorax. The visualized skeletal structures are unremarkable. IMPRESSION: 1.  Stable moderate to marked severity cardiac silhouette enlargement. 2. Chronic interstitial lung disease with mild left infrahilar atelectasis and/or infiltrate. Electronically Signed   By: Aram Candela M.D.   On: 03/25/2022 18:54    Procedures .Critical Care  Performed by: Elson Areas, PA-C Authorized by: Elson Areas, PA-C   Critical care  provider statement:    Critical care time (minutes):  30   Critical care start time:  03/25/2022 6:30 AM   Critical care end time:  03/25/2022 8:42 PM   Critical care time was exclusive of:  Separately billable procedures and treating other patients   Critical care was necessary to treat or prevent imminent or life-threatening deterioration of the following conditions:  Cardiac failure and respiratory failure   Critical care was time spent personally by me on the following activities:  Development of treatment plan with patient or surrogate, discussions with consultants, evaluation of patient's response to treatment, examination of patient, ordering and review of laboratory studies, ordering and review of radiographic studies, ordering and performing treatments and interventions, pulse oximetry, re-evaluation of patient's condition and review of old charts   Care discussed with: admitting provider   Comments:     Hospitalist consulted for admission     Medications Ordered in ED Medications  cefTRIAXone (ROCEPHIN) 2 g in sodium chloride 0.9 % 100 mL IVPB (2 g Intravenous New Bag/Given 03/25/22 2022)  vancomycin (VANCOREADY) IVPB 1750 mg/350 mL (has no administration in time range)  sodium chloride 0.9 % bolus 1,000 mL (0 mLs Intravenous Stopped 03/25/22 1844)    ED Course/ Medical Decision Making/ A&P                           Medical Decision Making Patient sent to the hospital by roommate who reports she has not been out of bed today.  Patient has weeping sores on her lower legs.  Amount and/or Complexity of Data Reviewed Independent  Historian: EMS External Data Reviewed: notes.    Details: Hospitalist notes from previous admission reviewed Labs: ordered. Decision-making details documented in ED Course.    Details: Patient has an elevated lactic acid of 2.9.  Troponin is elevated at 272.  Patient has a BNP of 1852. Radiology: ordered and independent interpretation performed. Decision-making details documented in ED Course.    Details: Chest x-ray shows chronic interstitial disease mild infrahilar atelectasis or infiltrate  Risk Prescription drug management. Decision regarding hospitalization. Risk Details: MDM started IV fluids on patient as she appears dehydrated. After reviewing patient's history of congestive heart failure, chest x-ray and BNP stopped patient's IV fluids sepsis protocol was initiated patient was given IV antibiotics.  Hospitalist is consulted for admission   MDM: Patient had an episode where she become hypoxic to the 27s.  RN was able to really position patient.  ABG was obtained.  His laboratory evaluations returned patient has an elevated lactic acid and an elevated white blood cell count I suspect this is secondary to infection to bilateral lower legs        Final Clinical Impression(s) / ED Diagnoses Final diagnoses:  Bilateral cellulitis of lower leg    Rx / DC Orders ED Discharge Orders     None         Osie Cheeks 03/25/22 2043    Sloan Leiter, DO 03/25/22 2324

## 2022-03-25 NOTE — Sepsis Progress Note (Signed)
Elink following for Sepsis Protocol 

## 2022-03-25 NOTE — ED Notes (Signed)
Pt placed on veni mask at 50% along with .  Will monitor for changes.

## 2022-03-25 NOTE — ED Triage Notes (Signed)
Pt to ER via EMS from home with c/o tremors.  Pt voices no c/o pain.  Pt arrives with bilateral lower extremities sloughing skin and weeping.  Pt is noncompliant with medications and oxygen.

## 2022-03-25 NOTE — ED Notes (Signed)
This NT and [facility] EMT cleaned patient up, changed linens and pads. Patient had a BM, and drainage from legs. [place] in place with a brief holding it due to movement. Patient moved up, 2 pillows and a sheet for covering. RN notified.

## 2022-03-25 NOTE — Progress Notes (Addendum)
Pharmacy Antibiotic Note  Valerie Rodgers is a 71 y.o. female for which pharmacy has been consulted for vancomycin dosing for cellulitis.  Patient with a history of HF, AF, COPD, HTN, CKD, polysubstance abuse. Patient presenting with tremors.  SCr 1.16  WBC 19.2; LA 2.9; T 99.3; HR 91; RR 21  Plan: Ceftriaxone per MD Vancomycin 1750 mg once then 1250 mg q24hr (eAUC 552) unless change in renal function Trend WBC, Fever, Renal function F/u cultures, clinical course, WBC, fever De-escalate when able Levels at steady state  Height: 5\' 4"  (162.6 cm) Weight: 79 kg (174 lb 2.6 oz) IBW/kg (Calculated) : 54.7  No data recorded.  Recent Labs  Lab 03/19/22 0115 03/20/22 0343 03/25/22 1733 03/25/22 1741  WBC  --  5.6 19.2*  --   CREATININE 1.15* 1.14* 1.16*  --   LATICACIDVEN  --   --   --  2.9*    Estimated Creatinine Clearance: 44.6 mL/min (A) (by C-G formula based on SCr of 1.16 mg/dL (H)).    No Known Allergies  Antimicrobials this admission: vancomycin 10/7 >>  rocephin 10/7 >>   Microbiology results: Pending  Thank you for allowing pharmacy to be a part of this patient's care.  Lorelei Pont, PharmD, BCPS 03/25/2022 8:06 PM ED Clinical Pharmacist -  [phone]

## 2022-03-25 NOTE — ED Notes (Signed)
Notified ED provider Alyse Low PA-C of patient's critical labs: lactic 2.9 and troponin 272.

## 2022-03-25 NOTE — ED Notes (Signed)
Patient transported to Ultrasound 

## 2022-03-26 ENCOUNTER — Inpatient Hospital Stay (HOSPITAL_COMMUNITY): Payer: Medicare Other

## 2022-03-26 DIAGNOSIS — J9622 Acute and chronic respiratory failure with hypercapnia: Secondary | ICD-10-CM | POA: Diagnosis not present

## 2022-03-26 DIAGNOSIS — R6520 Severe sepsis without septic shock: Secondary | ICD-10-CM | POA: Diagnosis not present

## 2022-03-26 DIAGNOSIS — A419 Sepsis, unspecified organism: Secondary | ICD-10-CM

## 2022-03-26 DIAGNOSIS — R7881 Bacteremia: Secondary | ICD-10-CM

## 2022-03-26 DIAGNOSIS — L03119 Cellulitis of unspecified part of limb: Secondary | ICD-10-CM | POA: Diagnosis not present

## 2022-03-26 DIAGNOSIS — J9621 Acute and chronic respiratory failure with hypoxia: Secondary | ICD-10-CM | POA: Diagnosis not present

## 2022-03-26 LAB — BLOOD CULTURE ID PANEL (REFLEXED) - BCID2

## 2022-03-26 LAB — HEPATITIS PANEL, ACUTE
HCV Ab: NONREACTIVE
Hep A IgM: NONREACTIVE
Hep B C IgM: NONREACTIVE
Hepatitis B Surface Ag: NONREACTIVE

## 2022-03-26 LAB — TROPONIN I (HIGH SENSITIVITY): Troponin I (High Sensitivity): 381 ng/L (ref <18)

## 2022-03-26 LAB — COMPREHENSIVE METABOLIC PANEL
ALT: 42 U/L (ref 0–44)
AST: 44 U/L — ABNORMAL HIGH (ref 15–41)
Albumin: 2.7 g/dL — ABNORMAL LOW (ref 3.5–5.0)
Alkaline Phosphatase: 114 U/L (ref 38–126)
Anion gap: 7 (ref 5–15)
BUN: 26 mg/dL — ABNORMAL HIGH (ref 8–23)
CO2: 32 mmol/L (ref 22–32)
Calcium: 8.1 mg/dL — ABNORMAL LOW (ref 8.9–10.3)
Chloride: 105 mmol/L (ref 98–111)
Creatinine, Ser: 1.21 mg/dL — ABNORMAL HIGH (ref 0.44–1.00)
GFR, Estimated: 48 mL/min — ABNORMAL LOW (ref >60)
Glucose, Bld: 180 mg/dL — ABNORMAL HIGH (ref 70–99)
Potassium: 4 mmol/L (ref 3.5–5.1)
Sodium: 144 mmol/L (ref 135–145)
Total Bilirubin: 1 mg/dL (ref 0.3–1.2)
Total Protein: 5.9 g/dL — ABNORMAL LOW (ref 6.5–8.1)

## 2022-03-26 LAB — ECHOCARDIOGRAM COMPLETE
Calc EF: 32.8 %
Height: 64 in
S' Lateral: 4.7 cm
Single Plane A2C EF: 33.8 %
Single Plane A4C EF: 28.9 %
Weight: 2853.63 oz

## 2022-03-26 LAB — URINALYSIS, ROUTINE W REFLEX MICROSCOPIC
Bilirubin Urine: NEGATIVE
Glucose, UA: NEGATIVE mg/dL
Hgb urine dipstick: NEGATIVE
Ketones, ur: NEGATIVE mg/dL
Leukocytes,Ua: NEGATIVE
Nitrite: NEGATIVE
Protein, ur: 100 mg/dL — AB
Specific Gravity, Urine: 1.023 (ref 1.005–1.030)
pH: 5 (ref 5.0–8.0)

## 2022-03-26 LAB — CBC
HCT: 36.3 % (ref 36.0–46.0)
Hemoglobin: 11.7 g/dL — ABNORMAL LOW (ref 12.0–15.0)
MCH: 27 pg (ref 26.0–34.0)
MCHC: 32.2 g/dL (ref 30.0–36.0)
MCV: 83.8 fL (ref 80.0–100.0)
Platelets: 163 10*3/uL (ref 150–400)
RBC: 4.33 MIL/uL (ref 3.87–5.11)
RDW: 19 % — ABNORMAL HIGH (ref 11.5–15.5)
WBC: 15.2 10*3/uL — ABNORMAL HIGH (ref 4.0–10.5)
nRBC: 0 % (ref 0.0–0.2)

## 2022-03-26 LAB — RAPID URINE DRUG SCREEN, HOSP PERFORMED
Amphetamines: NOT DETECTED
Barbiturates: NOT DETECTED
Benzodiazepines: NOT DETECTED
Cocaine: POSITIVE — AB
Opiates: NOT DETECTED
Tetrahydrocannabinol: NOT DETECTED

## 2022-03-26 LAB — MRSA NEXT GEN BY PCR, NASAL: MRSA by PCR Next Gen: NOT DETECTED

## 2022-03-26 LAB — LACTIC ACID, PLASMA: Lactic Acid, Venous: 2.1 mmol/L (ref 0.5–1.9)

## 2022-03-26 LAB — AMMONIA: Ammonia: 48 umol/L — ABNORMAL HIGH (ref 9–35)

## 2022-03-26 LAB — ETHANOL: Alcohol, Ethyl (B): <10 mg/dL (ref <10)

## 2022-03-26 LAB — MAGNESIUM: Magnesium: 1.9 mg/dL (ref 1.7–2.4)

## 2022-03-26 MED ORDER — MAGNESIUM SULFATE IN D5W 1-5 GM/100ML-% IV SOLN
1.0000 g | Freq: Once | INTRAVENOUS | Status: AC
  Administered 2022-03-26: 1 g via INTRAVENOUS
  Filled 2022-03-26: qty 100

## 2022-03-26 MED ORDER — PERFLUTREN LIPID MICROSPHERE
1.0000 mL | INTRAVENOUS | Status: AC | PRN
  Administered 2022-03-26: 4 mL via INTRAVENOUS

## 2022-03-26 MED ORDER — FENTANYL CITRATE PF 50 MCG/ML IJ SOSY
12.5000 ug | PREFILLED_SYRINGE | INTRAMUSCULAR | Status: DC | PRN
  Administered 2022-03-26 – 2022-03-28 (×4): 12.5 ug via INTRAVENOUS
  Filled 2022-03-26 (×4): qty 1

## 2022-03-26 MED ORDER — CEFAZOLIN SODIUM-DEXTROSE 2-4 GM/100ML-% IV SOLN
2.0000 g | Freq: Three times a day (TID) | INTRAVENOUS | Status: DC
  Administered 2022-03-26 – 2022-03-27 (×4): 2 g via INTRAVENOUS
  Filled 2022-03-26 (×4): qty 100

## 2022-03-26 NOTE — Plan of Care (Signed)
  Problem: Clinical Measurements: Goal: Ability to maintain clinical measurements within normal limits will improve Outcome: Progressing Goal: Respiratory complications will improve Outcome: Progressing Goal: Cardiovascular complication will be avoided Outcome: Progressing   

## 2022-03-26 NOTE — Progress Notes (Signed)
Pt had 17 beat run of VT.  Pt currently somnolent, bp 90/70 (75), HR 86 afib.  Dr. Nevada Crane paged.

## 2022-03-26 NOTE — Progress Notes (Signed)
PHARMACY - PHYSICIAN COMMUNICATION CRITICAL VALUE ALERT - BLOOD CULTURE IDENTIFICATION (BCID)  Valerie Rodgers is an 71 y.o. female who presented to [facility] on 03/25/2022 with a chief complaint of tremor and somnolence  Assessment:  1 out of 4 blood cultures positive for strep pyogenes. Likely contaminant  Name of physician (or Provider) Contacted: Eliseo Squires  Current antibiotics: Rocephin  Changes to prescribed antibiotics recommended:  Patient is on recommended antibiotics - No changes needed  Results for orders placed or performed during the hospital encounter of 03/25/22  Blood Culture ID Panel (Reflexed) (Collected: 03/25/2022  5:38 PM)  Result Value Ref Range   Enterococcus faecalis NOT DETECTED NOT DETECTED   Enterococcus Faecium NOT DETECTED NOT DETECTED   Listeria monocytogenes NOT DETECTED NOT DETECTED   Staphylococcus species NOT DETECTED NOT DETECTED   Staphylococcus aureus (BCID) NOT DETECTED NOT DETECTED   Staphylococcus epidermidis NOT DETECTED NOT DETECTED   Staphylococcus lugdunensis NOT DETECTED NOT DETECTED   Streptococcus species DETECTED (A) NOT DETECTED   Streptococcus agalactiae NOT DETECTED NOT DETECTED   Streptococcus pneumoniae NOT DETECTED NOT DETECTED   Streptococcus pyogenes DETECTED (A) NOT DETECTED   A.calcoaceticus-baumannii NOT DETECTED NOT DETECTED   Bacteroides fragilis NOT DETECTED NOT DETECTED   Enterobacterales NOT DETECTED NOT DETECTED   Enterobacter cloacae complex NOT DETECTED NOT DETECTED   Escherichia coli NOT DETECTED NOT DETECTED   Klebsiella aerogenes NOT DETECTED NOT DETECTED   Klebsiella oxytoca NOT DETECTED NOT DETECTED   Klebsiella pneumoniae NOT DETECTED NOT DETECTED   Proteus species NOT DETECTED NOT DETECTED   Salmonella species NOT DETECTED NOT DETECTED   Serratia marcescens NOT DETECTED NOT DETECTED   Haemophilus influenzae NOT DETECTED NOT DETECTED   Neisseria meningitidis NOT DETECTED NOT DETECTED   Pseudomonas aeruginosa  NOT DETECTED NOT DETECTED   Stenotrophomonas maltophilia NOT DETECTED NOT DETECTED   Candida albicans NOT DETECTED NOT DETECTED   Candida auris NOT DETECTED NOT DETECTED   Candida glabrata NOT DETECTED NOT DETECTED   Candida krusei NOT DETECTED NOT DETECTED   Candida parapsilosis NOT DETECTED NOT DETECTED   Candida tropicalis NOT DETECTED NOT DETECTED   Cryptococcus neoformans/gattii NOT DETECTED NOT DETECTED    Corinda Gubler 03/26/2022  12:56 PM

## 2022-03-26 NOTE — Plan of Care (Signed)
  Problem: Clinical Measurements: Goal: Ability to maintain clinical measurements within normal limits will improve Outcome: Progressing Goal: Diagnostic test results will improve Outcome: Progressing Goal: Respiratory complications will improve Outcome: Progressing Goal: Cardiovascular complication will be avoided Outcome: Progressing   Problem: Nutrition: Goal: Adequate nutrition will be maintained Outcome: Progressing   

## 2022-03-26 NOTE — Progress Notes (Signed)
Echocardiogram 2D Echocardiogram has been performed.  Fidel Levy 03/26/2022, 4:00 PM

## 2022-03-26 NOTE — Consult Note (Signed)
[facility]    Date of Admission:  03/25/2022     Reason for Consult: group a Strep bacteremia    Referring Provider: Angelica Ran    Abx: 10/07-c ceftriaxone  10/07-8 vancomycin       Assessment: 71 yo female ?substance abuse, with copd on o2, CHF, substance abuse, chronic wounds, afib not on anticoag, admitted with severe sepsis (ams/slight lft elevation) found to have GAS bacteremia complicated by copd exacerbation and troponinemia   Uds with cocaine.   Group a strep bacteremia. Source thought to hbe leg wounds associated cellulitis/wound infection Clinically no sign of nec-fasc  This is so far not severe infection in terms of GAS complication, and already clinically improving. I do not feel linezolid is needed at this time  Low burden GAS bacteremia and not a typical agent of IE or metastatic pyogenic focus so at this point given lack of symptomatology to suggest metastatic infection no need for tte or other w/u  Slight lft up likely sepsis, no tenderness on my exam or prior prodrome to suggest biliary source sepsis.   Plan: Will change abx ceftriaxone to cefazolin, high dose for bacteremia 2 gram iv q8hr If continued improvement in another 1-2 days could change cefadroxil to finish 10 day course. Keeping a little broad given the chronic wound infection Discussed with dr Eliseo Squires  I spent 75 minute reviewing data/chart, and coordinating care and >50% direct face to face time providing counseling/discussing diagnostics/treatment plan with patient       ------------------------------------------------ Principal Problem:   Acute on chronic respiratory failure with hypoxia and hypercapnia (HCC) Active Problems:   HFrEF (heart failure with reduced ejection fraction) (HCC)   Elevated LFTs   Atrial flutter, paroxysmal (HCC)   Stage 3a chronic kidney disease (CKD) ([place])   COPD with acute exacerbation ([place])   Decubitus ulcer of left  buttock   Elevated troponin   Cellulitis   Acute encephalopathy    HPI: Valerie Rodgers is a 71 y.o. female with copd on o2, CHF, substance abuse, chronic wounds, afib not on anticoag, admitted with severe sepsis (ams) found to have GAS bacteremia complicated by copd exacerbation and troponinemia   Patient able to answer some yes no question but poor historian although she is rather alert  From chart it appears she was for the 24 hours prior to admission not as alert and gotten out of bed so brought in by ambulance for this  She really doesn't know why she is here  I am unclear how many liter of oxygen she is on at home  In the ed: Required 5 liters o2 via [place] (brief bipap needed overnight), afebrile, not hypotensive Leukocytosis 20 Cr 1.1 Slight lft elevation Cxr questions of pulm opacity vs atelectasis Ruq [place] slight thickening gall bladder Started on vanc/ceftriaxone Deescalated to just ceftriaxone after bcx returns with GAS and no mrsa Uds actually has cocaine  Per nursing staff has been eating well/alert Just legs pain and no abd pain/n/v/cough/dyspnea  Wbc down Cr stable  Slight trop up but trending down as well; no chest pain  Family History  Problem Relation Age of Onset   Stroke Father    Colon cancer Neg Hx    Heart disease Neg Hx     Social History   Tobacco Use   Smoking status: Some Days    Packs/day: 0.25    Years: 65.00    Total pack years:  16.25    Types: Cigarettes   Smokeless tobacco: Never  Vaping Use   Vaping Use: Never used  Substance Use Topics   Alcohol use: Yes    Comment: 1 pint weekly   Drug use: Yes    Types: Cocaine, Marijuana    No Known Allergies  Review of Systems: ROS All Other ROS was negative, except mentioned above   Past Medical History:  Diagnosis Date   Arthritis    Bipolar affective disorder (HCC)    Cataract        Scheduled Meds:  atorvastatin  20 mg Oral QHS   carvedilol  3.125 mg Oral BID WC    furosemide  40 mg Oral Daily   ivabradine  5 mg Oral BID WC   methylPREDNISolone (SOLU-MEDROL) injection  125 mg Intravenous Q12H   sodium chloride flush  3 mL Intravenous Q12H   Continuous Infusions:  cefTRIAXone (ROCEPHIN)  IV     magnesium sulfate bolus IVPB 1 g (03/26/22 1239)   PRN Meds:.acetaminophen **OR** acetaminophen, albuterol, ondansetron **OR** ondansetron (ZOFRAN) IV, senna-docusate   OBJECTIVE: Blood pressure 101/65, pulse 86, temperature 98.7 F (37.1 C), temperature source Axillary, resp. rate 14, height 5\' 4"  (1.626 m), weight 80.9 kg, SpO2 99 %.  Physical Exam  General/constitutional: no distress, pleasant HEENT: Normocephalic, PER, Conj Clear, EOMI, Oropharynx clear Neck supple CV: rrr no mrg Lungs: clear to auscultation, normal respiratory effort Abd: Soft, Nontender Ext: bilateral 1+edema to knees Skin: stage 2-3 ulcer type open wound bilateral legs with tenderness and fibrinous vs slight purulent drainage and warmth Neuro: nonfocal MSK: no peripheral joint swelling/tenderness/warmth       Lab Results Lab Results  Component Value Date   WBC 15.2 (H) 03/26/2022   HGB 11.7 (L) 03/26/2022   HCT 36.3 03/26/2022   MCV 83.8 03/26/2022   PLT 163 03/26/2022    Lab Results  Component Value Date   CREATININE 1.21 (H) 03/26/2022   BUN 26 (H) 03/26/2022   NA 144 03/26/2022   K 4.0 03/26/2022   CL 105 03/26/2022   CO2 32 03/26/2022    Lab Results  Component Value Date   ALT 42 03/26/2022   AST 44 (H) 03/26/2022   ALKPHOS 114 03/26/2022   BILITOT 1.0 03/26/2022      Microbiology: Recent Results (from the past 240 hour(s))  Blood culture (routine x 2)     Status: None (Preliminary result)   Collection Time: 03/25/22  5:33 PM   Specimen: BLOOD  Result Value Ref Range Status   Specimen Description BLOOD LEFT ANTECUBITAL  Final   Special Requests   Final    BOTTLES DRAWN AEROBIC AND ANAEROBIC Blood Culture results may not be optimal due to an  inadequate volume of blood received in culture bottles   Culture   Final    NO GROWTH < 12 HOURS Performed at [facility], [address], [place], [place] [address]    Report Status PENDING  Incomplete  Blood culture (routine x 2)     Status: None (Preliminary result)   Collection Time: 03/25/22  5:38 PM   Specimen: BLOOD  Result Value Ref Range Status   Specimen Description BLOOD BLOOD LEFT HAND  Final   Special Requests   Final    BOTTLES DRAWN AEROBIC AND ANAEROBIC Blood Culture results may not be optimal due to an inadequate volume of blood received in culture bottles   Culture  Setup Time   Final  GRAM POSITIVE COCCI IN CLUSTERS AEROBIC BOTTLE ONLY CRITICAL RESULT CALLED TO, READ BACK BY AND VERIFIED WITH: PHARMD C.PIERCE AT 1223 ON 03/26/2022 BY T.SAAD. Performed at [facility], [place] [address], [place], [place] [address]    Culture GRAM POSITIVE COCCI  Final   Report Status PENDING  Incomplete  Blood Culture ID Panel (Reflexed)     Status: Abnormal   Collection Time: 03/25/22  5:38 PM  Result Value Ref Range Status   Enterococcus faecalis NOT DETECTED NOT DETECTED Final   Enterococcus Faecium NOT DETECTED NOT DETECTED Final   Listeria monocytogenes NOT DETECTED NOT DETECTED Final   Staphylococcus species NOT DETECTED NOT DETECTED Final   Staphylococcus aureus (BCID) NOT DETECTED NOT DETECTED Final   Staphylococcus epidermidis NOT DETECTED NOT DETECTED Final   Staphylococcus lugdunensis NOT DETECTED NOT DETECTED Final   Streptococcus species DETECTED (A) NOT DETECTED Final    Comment: CRITICAL RESULT CALLED TO, READ BACK BY AND VERIFIED WITH: PHARMD C.PIERCE AT 1223 ON 03/26/2022 BY T.SAAD.    Streptococcus agalactiae NOT DETECTED NOT DETECTED Final   Streptococcus pneumoniae NOT DETECTED NOT DETECTED Final   Streptococcus pyogenes DETECTED (A) NOT DETECTED Final    Comment: CRITICAL RESULT CALLED TO, READ BACK BY AND VERIFIED WITH: PHARMD C.PIERCE AT 1223  ON 03/26/2022 BY T.SAAD.    A.calcoaceticus-baumannii NOT DETECTED NOT DETECTED Final   Bacteroides fragilis NOT DETECTED NOT DETECTED Final   Enterobacterales NOT DETECTED NOT DETECTED Final   Enterobacter cloacae complex NOT DETECTED NOT DETECTED Final   Escherichia coli NOT DETECTED NOT DETECTED Final   Klebsiella aerogenes NOT DETECTED NOT DETECTED Final   Klebsiella oxytoca NOT DETECTED NOT DETECTED Final   Klebsiella pneumoniae NOT DETECTED NOT DETECTED Final   Proteus species NOT DETECTED NOT DETECTED Final   Salmonella species NOT DETECTED NOT DETECTED Final   Serratia marcescens NOT DETECTED NOT DETECTED Final   Haemophilus influenzae NOT DETECTED NOT DETECTED Final   Neisseria meningitidis NOT DETECTED NOT DETECTED Final   Pseudomonas aeruginosa NOT DETECTED NOT DETECTED Final   Stenotrophomonas maltophilia NOT DETECTED NOT DETECTED Final   Candida albicans NOT DETECTED NOT DETECTED Final   Candida auris NOT DETECTED NOT DETECTED Final   Candida glabrata NOT DETECTED NOT DETECTED Final   Candida krusei NOT DETECTED NOT DETECTED Final   Candida parapsilosis NOT DETECTED NOT DETECTED Final   Candida tropicalis NOT DETECTED NOT DETECTED Final   Cryptococcus neoformans/gattii NOT DETECTED NOT DETECTED Final    Comment: Performed at [facility], [place] [address], [place], [place] [address]  SARS Coronavirus 2 by RT PCR (hospital order, performed in [facility] hospital lab) *cepheid single result test* Anterior Nasal Swab     Status: None   Collection Time: 03/25/22  5:42 PM   Specimen: Anterior Nasal Swab  Result Value Ref Range Status   SARS Coronavirus 2 by RT PCR NEGATIVE NEGATIVE Final    Comment: (NOTE) SARS-CoV-2 target nucleic acids are NOT DETECTED.  The SARS-CoV-2 RNA is generally detectable in upper and lower respiratory specimens during the acute phase of infection. The lowest concentration of SARS-CoV-2 viral copies this assay can detect is 250 copies / mL.  A negative result does not preclude SARS-CoV-2 infection and should not be used as the sole basis for treatment or other patient management decisions.  A negative result may occur with improper specimen collection / handling, submission of specimen other than nasopharyngeal swab, presence of viral mutation(s) within the areas targeted  by this assay, and inadequate number of viral copies (<250 copies / mL). A negative result must be combined with clinical observations, patient history, and epidemiological information.  Fact Sheet for Patients:   https://www.patel.info/  Fact Sheet for Healthcare Providers: https://hall.com/  This test is not yet approved or  cleared by the [place] FDA and has been authorized for detection and/or diagnosis of SARS-CoV-2 by FDA under an Emergency Use Authorization (EUA).  This EUA will remain in effect (meaning this test can be used) for the duration of the COVID-19 declaration under Section 564(b)(1) of the Act, 21 U.S.C. section 360bbb-3(b)(1), unless the authorization is terminated or revoked sooner.  Performed at [facility], [place] [address], [place], [place] [address]   MRSA Next Gen by PCR, Nasal     Status: None   Collection Time: 03/26/22  2:05 AM   Specimen: Nasal Mucosa; Nasal Swab  Result Value Ref Range Status   MRSA by PCR Next Gen NOT DETECTED NOT DETECTED Final    Comment: (NOTE) The GeneXpert MRSA Assay (FDA approved for NASAL specimens only), is one component of a comprehensive MRSA colonization surveillance program. It is not intended to diagnose MRSA infection nor to guide or monitor treatment for MRSA infections. Test performance is not FDA approved in patients less than 39 years old. Performed at [facility], [place] [address], [place], [place] [address]      Serology:    Imaging: If present, new imagings (plain films, ct scans, and mri) have been personally  visualized and interpreted; radiology reports have been reviewed. Decision making incorporated into the Impression / Recommendations.  10/7 cxr 1. Stable moderate to marked severity cardiac silhouette enlargement. 2. Chronic interstitial lung disease with mild left infrahilar atelectasis and/or infiltrate.  10/7 ruq [place] 1. Gallbladder wall thickening and pericholecystic fluid without evidence of cholelithiasis or acute cholecystitis. 2. Trace amount of perihepatic free fluid.  Jabier Mutton, [facility] [place] [phone] pager    03/26/2022, 12:47 PM

## 2022-03-26 NOTE — Progress Notes (Addendum)
PROGRESS NOTE    Valerie Rodgers  [id number] DOB: Feb 04, 1951 DOA: 03/25/2022 PCP: Pcp, No    Brief Narrative:   Valerie Rodgers is a pleasant 71 y.o. female with medical history significant for COPD, chronic hypoxic respiratory failure, chronic combined systolic and diastolic CHF, substance abuse, chronic wounds, and atrial fibrillation not on anticoagulation who presents with tremor and somnolence.   Patient had reportedly not been out of bed today at her boardinghouse, was noted to have a new tremor, and EMS was called.  She is somnolent in the ED but can be woken and will answer a few basic questions before returning to sleep.  She denies chest pain, fever, chills, or abdominal pain.  Assessment and Plan: COPD exacerbation; acute on chronic hypoxic and hypercarbic respiratory failure  - Presents with somnolence and tremor and found to be hypoxic and hypercarbic with tachypnea, labored breathing, and wheezes  - Check sputum culture, start systemic steroids and antibiotic, schedule duonebs and use additional albuterol as needed, use BiPAP PRN   -wean O2 to off    Cellulitis  - She has chronic leg wounds and appears to have secondary cellulitis without fluctuance or crepitus  - Blood cultures were collected in ED and antibiotics started  - IV abx for now: switch to cefadroxil to finish 10-14 day treatment     Elevated LFTs  - She has mild elevation in transaminases and bilirubin  - There is mild RUQ tenderness on exam  - Check viral hepatitis panel and RUQ [place], trend LFTs     Chronic combined systolic & diastolic CHF  - EF was [id number] with grade 3 diastolic dysfunction in May 2023  - She appears to be compensated, wt is down >5 kg from time of recent admission for acute CHF  - Continue Lasix and Coreg, monitor wt and I/Os -follows with palliative care outpatient      CKD IIIa  - SCR is 1.16 on admission, appears close to baseline  - Renally-dose medications, monitor    Elevated  troponin  - Troponin is elevated 272 in ED without chest pain  - Continue cardiac monitoring, trend troponin, continue beta-blocker, check echocardiogram    -encourage cocaine cessation   Acute encephalopathy  - wakes to painful stimuli and answers some basic questions  - No focal neurologic deficits identified  - Ammonia level is slightly elevated -suspect related to cocaine   PAF  - Not anticoagulated, continue Coreg   Obesity Estimated body mass index is 30.61 kg/m as calculated from the following:   Height as of this encounter: 5\' 4"  (1.626 m).   Weight as of this encounter: 80.9 kg.   DVT prophylaxis: SCDs Start: 03/25/22 2109    Code Status: DNR   Disposition Plan:  Level of care: Progressive Status is: Inpatient Remains inpatient appropriate because: sick    Consultants:  none   Subjective: sleepy  Objective: Vitals:   03/26/22 0519 03/26/22 0600 03/26/22 0800 03/26/22 0905  BP:  103/70 101/65   Pulse:  86 86   Resp:  18 14   Temp:   98.7 F (37.1 C)   TempSrc:   Axillary   SpO2:  100% 100% 99%  Weight: 80.9 kg     Height:        Intake/Output Summary (Last 24 hours) at 03/26/2022 1107 Last data filed at 03/25/2022 2125 Gross per 24 hour  Intake 445.32 ml  Output --  Net 445.32 ml   [employer]  03/25/22 1528 03/26/22 0519  Weight: 79 kg 80.9 kg    Examination:   General: Appearance:    Obese female in no acute distress     Lungs:     respirations unlabored  Heart:    Normal heart rate.    MS:   All extremities are intact.    Neurologic:   Will awaken        Data Reviewed: I have personally reviewed following labs and imaging studies  CBC: Recent Labs  Lab 03/20/22 0343 03/25/22 1733 03/25/22 1824 03/25/22 1858 03/26/22 0548  WBC 5.6 19.2*  --   --  15.2*  NEUTROABS  --  17.0*  --   --   --   HGB 11.1* 12.3 13.9 13.3 11.7*  HCT 34.6* 39.8 41.0 39.0 36.3  MCV 84.6 88.6  --   --  83.8  PLT 154 PLATELET CLUMPS NOTED ON  SMEAR, UNABLE TO ESTIMATE  --   --  237   Basic Metabolic Panel: Recent Labs  Lab 03/20/22 0343 03/25/22 1733 03/25/22 1824 03/25/22 1858 03/26/22 0548  NA 144 144 144 142 144  K 3.8 4.2 3.6 3.7 4.0  CL 97* 104  --   --  105  CO2 36* 28  --   --  32  GLUCOSE 91 84  --   --  180*  BUN 25* 24*  --   --  26*  CREATININE 1.14* 1.16*  --   --  1.21*  CALCIUM 8.2* 8.2*  --   --  8.1*  MG  --  1.9  --   --  1.9   GFR: Estimated Creatinine Clearance: 43.3 mL/min (A) (by C-G formula based on SCr of 1.21 mg/dL (H)). Liver Function Tests: Recent Labs  Lab 03/20/22 0343 03/25/22 1733 03/26/22 0548  AST 21 61* 44*  ALT 15 47* 42  ALKPHOS 87 125 114  BILITOT 0.9 1.5* 1.0  PROT 5.3* 6.6 5.9*  ALBUMIN 2.5* 2.9* 2.7*   No results for input(s): "LIPASE", "AMYLASE" in the last 168 hours. Recent Labs  Lab 03/26/22 0548  AMMONIA 48*   Coagulation Profile: No results for input(s): "INR", "PROTIME" in the last 168 hours. Cardiac Enzymes: No results for input(s): "CKTOTAL", "CKMB", "CKMBINDEX", "TROPONINI" in the last 168 hours. BNP (last 3 results) No results for input(s): "PROBNP" in the last 8760 hours. HbA1C: No results for input(s): "HGBA1C" in the last 72 hours. CBG: No results for input(s): "GLUCAP" in the last 168 hours. Lipid Profile: No results for input(s): "CHOL", "HDL", "LDLCALC", "TRIG", "CHOLHDL", "LDLDIRECT" in the last 72 hours. Thyroid Function Tests: No results for input(s): "TSH", "T4TOTAL", "FREET4", "T3FREE", "THYROIDAB" in the last 72 hours. Anemia Panel: No results for input(s): "VITAMINB12", "FOLATE", "FERRITIN", "TIBC", "IRON", "RETICCTPCT" in the last 72 hours. Sepsis Labs: Recent Labs  Lab 03/25/22 1741 03/25/22 2039 03/26/22 0548  LATICACIDVEN 2.9* 2.0* 2.1*    Recent Results (from the past 240 hour(s))  Blood culture (routine x 2)     Status: None (Preliminary result)   Collection Time: 03/25/22  5:33 PM   Specimen: BLOOD  Result Value Ref  Range Status   Specimen Description BLOOD LEFT ANTECUBITAL  Final   Special Requests   Final    BOTTLES DRAWN AEROBIC AND ANAEROBIC Blood Culture results may not be optimal due to an inadequate volume of blood received in culture bottles   Culture   Final    NO GROWTH < 12 HOURS Performed at [facility]  [facility], [place] [address] [place], [place] [address]    Report Status PENDING  Incomplete  Blood culture (routine x 2)     Status: None (Preliminary result)   Collection Time: 03/25/22  5:38 PM   Specimen: BLOOD  Result Value Ref Range Status   Specimen Description BLOOD BLOOD LEFT HAND  Final   Special Requests   Final    BOTTLES DRAWN AEROBIC AND ANAEROBIC Blood Culture results may not be optimal due to an inadequate volume of blood received in culture bottles   Culture  Setup Time   Final    GRAM POSITIVE COCCI IN CLUSTERS AEROBIC BOTTLE ONLY Organism ID to follow Performed at [facility], [place] [address], [place], [place] [address]    Culture GRAM POSITIVE COCCI  Final   Report Status PENDING  Incomplete  SARS Coronavirus 2 by RT PCR (hospital order, performed in [facility] hospital lab) *cepheid single result test* Anterior Nasal Swab     Status: None   Collection Time: 03/25/22  5:42 PM   Specimen: Anterior Nasal Swab  Result Value Ref Range Status   SARS Coronavirus 2 by RT PCR NEGATIVE NEGATIVE Final    Comment: (NOTE) SARS-CoV-2 target nucleic acids are NOT DETECTED.  The SARS-CoV-2 RNA is generally detectable in upper and lower respiratory specimens during the acute phase of infection. The lowest concentration of SARS-CoV-2 viral copies this assay can detect is 250 copies / mL. A negative result does not preclude SARS-CoV-2 infection and should not be used as the sole basis for treatment or other patient management decisions.  A negative result may occur with improper specimen collection / handling, submission of specimen other than nasopharyngeal swab, presence  of viral mutation(s) within the areas targeted by this assay, and inadequate number of viral copies (<250 copies / mL). A negative result must be combined with clinical observations, patient history, and epidemiological information.  Fact Sheet for Patients:   https://www.patel.info/  Fact Sheet for Healthcare Providers: https://hall.com/  This test is not yet approved or  cleared by the [place] FDA and has been authorized for detection and/or diagnosis of SARS-CoV-2 by FDA under an Emergency Use Authorization (EUA).  This EUA will remain in effect (meaning this test can be used) for the duration of the COVID-19 declaration under Section 564(b)(1) of the Act, 21 U.S.C. section 360bbb-3(b)(1), unless the authorization is terminated or revoked sooner.  Performed at [facility], [place] [address], [place], [place] [address]   MRSA Next Gen by PCR, Nasal     Status: None   Collection Time: 03/26/22  2:05 AM   Specimen: Nasal Mucosa; Nasal Swab  Result Value Ref Range Status   MRSA by PCR Next Gen NOT DETECTED NOT DETECTED Final    Comment: (NOTE) The GeneXpert MRSA Assay (FDA approved for NASAL specimens only), is one component of a comprehensive MRSA colonization surveillance program. It is not intended to diagnose MRSA infection nor to guide or monitor treatment for MRSA infections. Test performance is not FDA approved in patients less than 33 years old. Performed at [facility], [place] [address], [place], [place] [address]          Radiology Studies: US Abdomen Limited RUQ (LIVER/GB)  Result Date: 03/25/2022 CLINICAL DATA:  Elevated liver function test. EXAM: ULTRASOUND ABDOMEN LIMITED RIGHT UPPER QUADRANT COMPARISON:  None Available. FINDINGS: Gallbladder: No gallstones are visualized. The gallbladder wall measures 3.62 mm in thickness. A moderate  amount of pericholecystic fluid is seen. No sonographic Murphy sign  noted by sonographer. Common bile duct: Diameter: 1.63 mm Liver: No focal lesion identified. Within normal limits in parenchymal echogenicity. Portal vein is patent on color Doppler imaging with normal direction of blood flow towards the liver. Other: A trace amount of free fluid is seen along the inferior aspect of the right lobe of the liver. IMPRESSION: 1. Gallbladder wall thickening and pericholecystic fluid without evidence of cholelithiasis or acute cholecystitis. 2. Trace amount of perihepatic free fluid. Electronically Signed   By: Virgina Norfolk M.D.   On: 03/25/2022 22:10   DG Chest Port 1 View  Result Date: 03/25/2022 CLINICAL DATA:  Tremors. EXAM: PORTABLE CHEST 1 VIEW COMPARISON:  March 12, 2022 FINDINGS: Stable moderate to marked severity cardiac silhouette enlargement is noted. Stable, mild to moderate severity diffusely increased interstitial lung markings are seen. Mild atelectasis and/or infiltrate is suspected along the infrahilar region on the left. There is no evidence of a pleural effusion or pneumothorax. The visualized skeletal structures are unremarkable. IMPRESSION: 1. Stable moderate to marked severity cardiac silhouette enlargement. 2. Chronic interstitial lung disease with mild left infrahilar atelectasis and/or infiltrate. Electronically Signed   By: Virgina Norfolk M.D.   On: 03/25/2022 18:54        Scheduled Meds:  atorvastatin  20 mg Oral QHS   carvedilol  3.125 mg Oral BID WC   furosemide  40 mg Oral Daily   ivabradine  5 mg Oral BID WC   methylPREDNISolone (SOLU-MEDROL) injection  125 mg Intravenous Q12H   sodium chloride flush  3 mL Intravenous Q12H   Continuous Infusions:  cefTRIAXone (ROCEPHIN)  IV     magnesium sulfate bolus IVPB       LOS: 1 day    Time spent: 45 minutes spent on chart review, discussion with nursing staff, consultants, updating family and interview/physical exam; more than 50% of that time was spent in counseling and/or  coordination of care.    Geradine Girt, DO [facility] Available via Epic secure chat 7am-7pm After these hours, please refer to coverage provider listed on [email or web address] 03/26/2022, 11:07 AM

## 2022-03-26 NOTE — Progress Notes (Signed)
Pt has PRN Bipap orders, no distress noted at this time.  

## 2022-03-26 NOTE — Progress Notes (Signed)
Received pt from ED to 2c07.  Pt alert and oriented to self and place, asking for ice cream and something to drink.  Assessment completed, pt with wounds to bilateral lower extremities which appear to be open blisters with purulent drainage.  Legs cleansed and wrapped with xerofoam, gauze and kerlix.  Wound care order placed for wound management.  Pt oriented to room and plan of care.

## 2022-03-27 DIAGNOSIS — J9622 Acute and chronic respiratory failure with hypercapnia: Secondary | ICD-10-CM | POA: Diagnosis not present

## 2022-03-27 DIAGNOSIS — J9621 Acute and chronic respiratory failure with hypoxia: Secondary | ICD-10-CM | POA: Diagnosis not present

## 2022-03-27 LAB — COMPREHENSIVE METABOLIC PANEL
ALT: 44 U/L (ref 0–44)
AST: 49 U/L — ABNORMAL HIGH (ref 15–41)
Albumin: 2.5 g/dL — ABNORMAL LOW (ref 3.5–5.0)
Alkaline Phosphatase: 133 U/L — ABNORMAL HIGH (ref 38–126)
Anion gap: 16 — ABNORMAL HIGH (ref 5–15)
BUN: 33 mg/dL — ABNORMAL HIGH (ref 8–23)
CO2: 25 mmol/L (ref 22–32)
Calcium: 8.5 mg/dL — ABNORMAL LOW (ref 8.9–10.3)
Chloride: 105 mmol/L (ref 98–111)
Creatinine, Ser: 1.46 mg/dL — ABNORMAL HIGH (ref 0.44–1.00)
GFR, Estimated: 38 mL/min — ABNORMAL LOW (ref >60)
Glucose, Bld: 137 mg/dL — ABNORMAL HIGH (ref 70–99)
Potassium: 4.9 mmol/L (ref 3.5–5.1)
Sodium: 146 mmol/L — ABNORMAL HIGH (ref 135–145)
Total Bilirubin: 0.4 mg/dL (ref 0.3–1.2)
Total Protein: 5.8 g/dL — ABNORMAL LOW (ref 6.5–8.1)

## 2022-03-27 LAB — CBC
HCT: 36.8 % (ref 36.0–46.0)
Hemoglobin: 11.4 g/dL — ABNORMAL LOW (ref 12.0–15.0)
MCH: 26.9 pg (ref 26.0–34.0)
MCHC: 31 g/dL (ref 30.0–36.0)
MCV: 86.8 fL (ref 80.0–100.0)
Platelets: UNDETERMINED 10*3/uL (ref 150–400)
RBC: 4.24 MIL/uL (ref 3.87–5.11)
RDW: 19.6 % — ABNORMAL HIGH (ref 11.5–15.5)
WBC: 15 10*3/uL — ABNORMAL HIGH (ref 4.0–10.5)
nRBC: 0.1 % (ref 0.0–0.2)

## 2022-03-27 MED ORDER — METHYLPREDNISOLONE SODIUM SUCC 125 MG IJ SOLR
80.0000 mg | Freq: Every day | INTRAMUSCULAR | Status: DC
  Administered 2022-03-27 – 2022-03-30 (×4): 80 mg via INTRAVENOUS
  Filled 2022-03-27 (×4): qty 2

## 2022-03-27 MED ORDER — METHOCARBAMOL 500 MG PO TABS
500.0000 mg | ORAL_TABLET | Freq: Three times a day (TID) | ORAL | Status: DC | PRN
  Administered 2022-03-29: 500 mg via ORAL
  Filled 2022-03-27: qty 1

## 2022-03-27 MED ORDER — SODIUM CHLORIDE 0.9 % IV SOLN
2.0000 g | INTRAVENOUS | Status: DC
  Administered 2022-03-27: 2 g via INTRAVENOUS
  Filled 2022-03-27: qty 20

## 2022-03-27 MED ORDER — DICYCLOMINE HCL 20 MG PO TABS: 20.0000 mg | ORAL_TABLET | Freq: Four times a day (QID) | ORAL | Status: AC | PRN

## 2022-03-27 MED ORDER — CLONIDINE HCL 0.1 MG PO TABS
0.1000 mg | ORAL_TABLET | Freq: Three times a day (TID) | ORAL | Status: DC
  Administered 2022-03-27 (×3): 0.1 mg via ORAL
  Filled 2022-03-27 (×3): qty 1

## 2022-03-27 MED ORDER — NAPROXEN 250 MG PO TABS: 500.0000 mg | ORAL_TABLET | Freq: Two times a day (BID) | ORAL | Status: DC | PRN

## 2022-03-27 MED ORDER — CLONIDINE HCL 0.1 MG PO TABS: 0.1000 mg | ORAL_TABLET | ORAL | Status: DC

## 2022-03-27 MED ORDER — MIDODRINE HCL 5 MG PO TABS
10.0000 mg | ORAL_TABLET | Freq: Three times a day (TID) | ORAL | Status: AC
  Administered 2022-03-27 – 2022-03-28 (×3): 10 mg via ORAL
  Filled 2022-03-27 (×3): qty 2

## 2022-03-27 MED ORDER — LOPERAMIDE HCL 2 MG PO CAPS: 2.0000 mg | ORAL_CAPSULE | ORAL | Status: DC | PRN

## 2022-03-27 MED ORDER — CLONIDINE HCL 0.1 MG PO TABS: 0.1000 mg | ORAL_TABLET | Freq: Every day | ORAL | Status: DC

## 2022-03-27 MED ORDER — SODIUM CHLORIDE 0.9 % IV BOLUS
250.0000 mL | INTRAVENOUS | Status: AC
  Administered 2022-03-27: 250 mL via INTRAVENOUS

## 2022-03-27 MED ORDER — HYDROXYZINE HCL 25 MG PO TABS
25.0000 mg | ORAL_TABLET | Freq: Four times a day (QID) | ORAL | Status: AC | PRN
  Administered 2022-03-29: 25 mg via ORAL
  Filled 2022-03-27: qty 1

## 2022-03-27 NOTE — Consult Note (Signed)
WOC Nurse Consult Note: Reason for Consult: bilateral LE leg wounds Unclear etiology; partial thickness and full thickness ulcerations of the bilateral LEs with some mild edema.  Patient has an extreme amount of pain with these which is suspecious for a vasculitis or arterial disease differential dx. She has a positive cocaine history, therefore lesions like these can be related to abuse of this type of drug. Further work up for arterial compromise could be done for exclusion.  Wound type:full and partial thickness wounds bilateral LEs Pressure Injury POA: NA Measurement: larger lesions; RLE 12cm x 10cm with island of intact skin x0.2cm; pink moist, serous drainage  Scattered RLE lesions proximal to the largest ulcers 1cm x 1cm x 0.1cm; all pink and moist  LLE ulcer medial and lateral aprox; 4cm x 3cm each x 0.2cm pink and moist; some lesions beginning just proximal to this  RLE is much more painful than the LLE Wound bed:see above  Drainage (amount, consistency, odor) serosanguinous, not really purulent  Periwound: mild edema; distal palpable pulses; no changes or wounds noted on the feet  Dressing procedure/placement/frequency: Conservative tx with xeroform gauze as a non adherent and dry dressing. I do not think patient can tolerate any type of compression; she screams out with dressing changes.    Follow up in a wound care center of her choice. I would not use compression without obtaining an ABI first and I am not sure patient could or would even allow this.   Compliance with wound care will be essential to heal these wounds if they can be healed.  Discussed POC with patient and bedside nurse.  Re consult if needed, will not follow at this time. Thanks  Katheryne Gorr [employer], RN,CWOCN, CNS, [place] [phone])

## 2022-03-27 NOTE — Progress Notes (Signed)
Mobility Specialist Progress Note    03/27/22 1659  Mobility  Activity Ambulated with assistance in room  Level of Assistance Minimal assist, patient does 75% or more  Assistive Device Front wheel walker  Distance Ambulated (ft) 12 ft  Activity Response Tolerated well  Mobility Referral Yes  $Mobility charge 1 Mobility   Pt received in chair and agreeable. C/o leg pain. Left supine with call bell in reach.   Hildred Alamin Mobility Specialist

## 2022-03-27 NOTE — Progress Notes (Signed)
PROGRESS NOTE    Valerie Rodgers  HCW:[id number] DOB: 1951-06-16 DOA: 03/25/2022 PCP: Pcp, No    Brief Narrative:   Valerie Rodgers is a pleasant 71 y.o. female with medical history significant for COPD, chronic hypoxic respiratory failure, chronic combined systolic and diastolic CHF, substance abuse, chronic wounds, and atrial fibrillation not on anticoagulation who presents with tremor and somnolence. Found to have bacteremia, cellulitis.    Assessment and Plan: COPD exacerbation; acute on chronic hypoxic and hypercarbic respiratory failure  - Presents with somnolence and tremor and found to be hypoxic and hypercarbic with tachypnea, labored breathing, and wheezes  - Check sputum culture, start systemic steroids and antibiotic, schedule duonebs and use additional albuterol as needed, use BiPAP PRN   -wean O2 to off if able   Group a strep bacteremia  -ID consult IV abx -source: skin   Cellulitis  - She has chronic leg wounds and appears to have secondary cellulitis without fluctuance or crepitus  - Blood cultures were collected in ED and antibiotics started  - IV abx per ID -wound care    Elevated LFTs  - She has mild elevation in transaminases and bilirubin  - viral hepatitis panel negative   Chronic combined systolic & diastolic CHF  - EF was <17% with grade 3 diastolic dysfunction in May 2023  - She appears to be compensated, wt is down >5 kg from time of recent admission for acute CHF  -  Coreg, monitor wt and I/Os -follows with palliative care outpatient      CKD IIIa  - SCR is 1.16 on admission, appears close to baseline  - Renally-dose medications, monitor    Elevated troponin  - Troponin is elevated 272 in ED without chest pain  - Continue cardiac monitoring, trend troponin, continue beta-blocker, check echocardiogram    -encourage cocaine cessation   Acute encephalopathy  - wakes to painful stimuli and answers some basic questions  - No focal neurologic  deficits identified  - Ammonia level is slightly elevated -suspect related to cocaine   PAF  - Not anticoagulated, continue Coreg   Obesity Estimated body mass index is 30.61 kg/m as calculated from the following:   Height as of this encounter: 5\' 4"  (1.626 m).   Weight as of this encounter: 80.9 kg.   DVT prophylaxis: SCDs Start: 03/25/22 2109    Code Status: DNR   Disposition Plan:  Level of care: Progressive Status is: Inpatient Remains inpatient appropriate because: sick    Consultants:  none   Subjective: C/o leg pain  Objective: Vitals:   03/27/22 0300 03/27/22 0828 03/27/22 0830 03/27/22 1138  BP: 123/77 108/76 108/76 121/81  Pulse: 76 90 87   Resp: 20  17   Temp: 98.7 F (37.1 C)     TempSrc: Oral     SpO2: 97%  94%   Weight:      Height:        Intake/Output Summary (Last 24 hours) at 03/27/2022 1206 Last data filed at 03/27/2022 0950 Gross per 24 hour  Intake 203.06 ml  Output --  Net 203.06 ml   Filed Weights   03/25/22 1528 03/26/22 0519  Weight: 79 kg 80.9 kg    Examination:   General: Appearance:    Obese female in no acute distress     Lungs:      respirations unlabored  Heart:    Normal heart rate. Normal rhythm. No murmurs, rubs, or gallops.   MS:  All extremities are intact.    Neurologic:   Awake, alert, poor insight         Data Reviewed: I have personally reviewed following labs and imaging studies  CBC: Recent Labs  Lab 03/25/22 1733 03/25/22 1824 03/25/22 1858 03/26/22 0548 03/27/22 0030  WBC 19.2*  --   --  15.2* 15.0*  NEUTROABS 17.0*  --   --   --   --   HGB 12.3 13.9 13.3 11.7* 11.4*  HCT 39.8 41.0 39.0 36.3 36.8  MCV 88.6  --   --  83.8 86.8  PLT PLATELET CLUMPS NOTED ON SMEAR, UNABLE TO ESTIMATE  --   --  163 PLATELET CLUMPS NOTED ON SMEAR, UNABLE TO ESTIMATE   Basic Metabolic Panel: Recent Labs  Lab 03/25/22 1733 03/25/22 1824 03/25/22 1858 03/26/22 0548 03/27/22 0030  NA 144 144 142 144 146*   K 4.2 3.6 3.7 4.0 4.9  CL 104  --   --  105 105  CO2 28  --   --  32 25  GLUCOSE 84  --   --  180* 137*  BUN 24*  --   --  26* 33*  CREATININE 1.16*  --   --  1.21* 1.46*  CALCIUM 8.2*  --   --  8.1* 8.5*  MG 1.9  --   --  1.9  --    GFR: Estimated Creatinine Clearance: 35.9 mL/min (A) (by C-G formula based on SCr of 1.46 mg/dL (H)). Liver Function Tests: Recent Labs  Lab 03/25/22 1733 03/26/22 0548 03/27/22 0030  AST 61* 44* 49*  ALT 47* 42 44  ALKPHOS 125 114 133*  BILITOT 1.5* 1.0 0.4  PROT 6.6 5.9* 5.8*  ALBUMIN 2.9* 2.7* 2.5*   No results for input(s): "LIPASE", "AMYLASE" in the last 168 hours. Recent Labs  Lab 03/26/22 0548  AMMONIA 48*   Coagulation Profile: No results for input(s): "INR", "PROTIME" in the last 168 hours. Cardiac Enzymes: No results for input(s): "CKTOTAL", "CKMB", "CKMBINDEX", "TROPONINI" in the last 168 hours. BNP (last 3 results) No results for input(s): "PROBNP" in the last 8760 hours. HbA1C: No results for input(s): "HGBA1C" in the last 72 hours. CBG: No results for input(s): "GLUCAP" in the last 168 hours. Lipid Profile: No results for input(s): "CHOL", "HDL", "LDLCALC", "TRIG", "CHOLHDL", "LDLDIRECT" in the last 72 hours. Thyroid Function Tests: No results for input(s): "TSH", "T4TOTAL", "FREET4", "T3FREE", "THYROIDAB" in the last 72 hours. Anemia Panel: No results for input(s): "VITAMINB12", "FOLATE", "FERRITIN", "TIBC", "IRON", "RETICCTPCT" in the last 72 hours. Sepsis Labs: Recent Labs  Lab 03/25/22 1741 03/25/22 2039 03/26/22 0548  LATICACIDVEN 2.9* 2.0* 2.1*    Recent Results (from the past 240 hour(s))  Blood culture (routine x 2)     Status: None (Preliminary result)   Collection Time: 03/25/22  5:33 PM   Specimen: BLOOD  Result Value Ref Range Status   Specimen Description BLOOD LEFT ANTECUBITAL  Final   Special Requests   Final    BOTTLES DRAWN AEROBIC AND ANAEROBIC Blood Culture results may not be optimal due to  an inadequate volume of blood received in culture bottles   Culture   Final    NO GROWTH < 12 HOURS Performed at [facility], [address], [place], [place] [address]    Report Status PENDING  Incomplete  Blood culture (routine x 2)     Status: None (Preliminary result)   Collection Time: 03/25/22  5:38 PM  Specimen: BLOOD  Result Value Ref Range Status   Specimen Description BLOOD BLOOD LEFT HAND  Final   Special Requests   Final    BOTTLES DRAWN AEROBIC AND ANAEROBIC Blood Culture results may not be optimal due to an inadequate volume of blood received in culture bottles   Culture  Setup Time   Final    GRAM POSITIVE COCCI IN CLUSTERS AEROBIC BOTTLE ONLY CRITICAL RESULT CALLED TO, READ BACK BY AND VERIFIED WITH: PHARMD C.PIERCE AT 1223 ON 03/26/2022 BY T.SAAD. GRAM NEGATIVE RODS CRITICAL RESULT CALLED TO, READ BACK BY AND VERIFIED WITH: J. FRENS PHARMD, AT [id number] 03/27/22 D. VANHOOK Performed at [facility], [place] [address], [place], [place] [address]    Culture GRAM POSITIVE COCCI GRAM NEGATIVE RODS   Final   Report Status PENDING  Incomplete  Blood Culture ID Panel (Reflexed)     Status: Abnormal   Collection Time: 03/25/22  5:38 PM  Result Value Ref Range Status   Enterococcus faecalis NOT DETECTED NOT DETECTED Final   Enterococcus Faecium NOT DETECTED NOT DETECTED Final   Listeria monocytogenes NOT DETECTED NOT DETECTED Final   Staphylococcus species NOT DETECTED NOT DETECTED Final   Staphylococcus aureus (BCID) NOT DETECTED NOT DETECTED Final   Staphylococcus epidermidis NOT DETECTED NOT DETECTED Final   Staphylococcus lugdunensis NOT DETECTED NOT DETECTED Final   Streptococcus species DETECTED (A) NOT DETECTED Final    Comment: CRITICAL RESULT CALLED TO, READ BACK BY AND VERIFIED WITH: PHARMD C.PIERCE AT 1223 ON 03/26/2022 BY T.SAAD.    Streptococcus agalactiae NOT DETECTED NOT DETECTED Final   Streptococcus pneumoniae NOT DETECTED NOT DETECTED Final    Streptococcus pyogenes DETECTED (A) NOT DETECTED Final    Comment: CRITICAL RESULT CALLED TO, READ BACK BY AND VERIFIED WITH: PHARMD C.PIERCE AT 1223 ON 03/26/2022 BY T.SAAD.    A.calcoaceticus-baumannii NOT DETECTED NOT DETECTED Final   Bacteroides fragilis NOT DETECTED NOT DETECTED Final   Enterobacterales NOT DETECTED NOT DETECTED Final   Enterobacter cloacae complex NOT DETECTED NOT DETECTED Final   Escherichia coli NOT DETECTED NOT DETECTED Final   Klebsiella aerogenes NOT DETECTED NOT DETECTED Final   Klebsiella oxytoca NOT DETECTED NOT DETECTED Final   Klebsiella pneumoniae NOT DETECTED NOT DETECTED Final   Proteus species NOT DETECTED NOT DETECTED Final   Salmonella species NOT DETECTED NOT DETECTED Final   Serratia marcescens NOT DETECTED NOT DETECTED Final   Haemophilus influenzae NOT DETECTED NOT DETECTED Final   Neisseria meningitidis NOT DETECTED NOT DETECTED Final   Pseudomonas aeruginosa NOT DETECTED NOT DETECTED Final   Stenotrophomonas maltophilia NOT DETECTED NOT DETECTED Final   Candida albicans NOT DETECTED NOT DETECTED Final   Candida auris NOT DETECTED NOT DETECTED Final   Candida glabrata NOT DETECTED NOT DETECTED Final   Candida krusei NOT DETECTED NOT DETECTED Final   Candida parapsilosis NOT DETECTED NOT DETECTED Final   Candida tropicalis NOT DETECTED NOT DETECTED Final   Cryptococcus neoformans/gattii NOT DETECTED NOT DETECTED Final    Comment: Performed at [facility], [place] [address], [place], [place] [address]  SARS Coronavirus 2 by RT PCR (hospital order, performed in [facility] hospital lab) *cepheid single result test* Anterior Nasal Swab     Status: None   Collection Time: 03/25/22  5:42 PM   Specimen: Anterior Nasal Swab  Result Value Ref Range Status   SARS Coronavirus 2 by RT PCR NEGATIVE NEGATIVE Final    Comment: (NOTE) SARS-CoV-2 target nucleic acids are NOT  DETECTED.  The SARS-CoV-2 RNA is generally detectable in upper and  lower respiratory specimens during the acute phase of infection. The lowest concentration of SARS-CoV-2 viral copies this assay can detect is 250 copies / mL. A negative result does not preclude SARS-CoV-2 infection and should not be used as the sole basis for treatment or other patient management decisions.  A negative result may occur with improper specimen collection / handling, submission of specimen other than nasopharyngeal swab, presence of viral mutation(s) within the areas targeted by this assay, and inadequate number of viral copies (<250 copies / mL). A negative result must be combined with clinical observations, patient history, and epidemiological information.  Fact Sheet for Patients:   https://www.patel.info/  Fact Sheet for Healthcare Providers: https://hall.com/  This test is not yet approved or  cleared by the [place] FDA and has been authorized for detection and/or diagnosis of SARS-CoV-2 by FDA under an Emergency Use Authorization (EUA).  This EUA will remain in effect (meaning this test can be used) for the duration of the COVID-19 declaration under Section 564(b)(1) of the Act, 21 U.S.C. section 360bbb-3(b)(1), unless the authorization is terminated or revoked sooner.  Performed at [facility], [place] [address] [place], [place] [address]   MRSA Next Gen by PCR, Nasal     Status: None   Collection Time: 03/26/22  2:05 AM   Specimen: Nasal Mucosa; Nasal Swab  Result Value Ref Range Status   MRSA by PCR Next Gen NOT DETECTED NOT DETECTED Final    Comment: (NOTE) The GeneXpert MRSA Assay (FDA approved for NASAL specimens only), is one component of a comprehensive MRSA colonization surveillance program. It is not intended to diagnose MRSA infection nor to guide or monitor treatment for MRSA infections. Test performance is not FDA approved in patients less than 54 years old. Performed at [facility], [place] [address] [place], [place] [address]          Radiology Studies: ECHOCARDIOGRAM COMPLETE  Result Date: 03/26/2022    ECHOCARDIOGRAM REPORT   Patient Name:   ASHTYNN SCHNELL Date of Exam: 03/26/2022 Medical Rec #:  [id number]       Height:       64.0 in Accession #:    [id number]      Weight:       178.4 lb Date of Birth:  Oct 01, 1950        BSA:          1.863 m Patient Age:    56 years        BP:           102/74 mmHg Patient Gender: F               HR:           79 bpm. Exam Location:  Inpatient Procedure: 2D Echo, 3D Echo, Color Doppler, Cardiac Doppler and Intracardiac            Opacification Agent Indications:     Elevated Troponin  History:         Patient has prior history of Echocardiogram examinations, most                  recent 11/15/2021. COPD; Arrythmias:Atrial Fibrillation.  Sonographer:     Bernadene Person RDCS Referring Phys:  [id number] Ilene Qua OPYD Diagnosing Phys: Adrian Prows MD IMPRESSIONS  1. 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 Left ventricular ejection fraction, by estimation, is 20 to  25%. Left ventricular ejection fraction by 3D volume is 29 %. The left ventricle has severely decreased function. The left ventricle demonstrates global hypokinesis. Left ventricular diastolic function could not be evaluated. There is the interventricular septum is flattened in systole and diastole, consistent with right ventricular pressure and volume overload.  2. Right ventricular systolic function is normal. The right ventricular size is normal. There is moderately elevated pulmonary artery systolic pressure.  3. Left atrial size was moderately dilated.  4. Right atrial size was moderately dilated.  5. The mitral valve is normal in structure. Moderate mitral valve regurgitation.  6. Tricuspid valve regurgitation is moderate.  7. The aortic valve is tricuspid. Aortic valve regurgitation is trivial. No aortic stenosis is present.  8. Pulmonic valve regurgitation is moderate.  Comparison(s): Compared to 05/302023, EF marginally better from 20% to 25%. 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 Left ventricular ejection fraction, by estimation, is 20 to 25%. Left ventricular ejection fraction by 3D volume is 29 %. The left ventricle has severely decreased function. The left ventricle demonstrates global hypokinesis. Definity contrast agent was given IV to delineate the left ventricular endocardial borders. The left ventricular internal cavity size was normal in size. There is no left ventricular hypertrophy. The interventricular septum is flattened in systole and diastole, consistent with right ventricular pressure and volume overload. Left ventricular diastolic function could not be evaluated. Right Ventricle: The right ventricular size is normal. No increase in right ventricular wall thickness. Right ventricular systolic function is normal. There is moderately elevated pulmonary artery systolic pressure. The tricuspid regurgitant velocity is 2.69 m/s, and with an assumed right atrial pressure of 15 mmHg, the estimated right ventricular systolic pressure is [id number] mmHg. Left Atrium: Left atrial size was moderately dilated. Right Atrium: Right atrial size was moderately dilated. Pericardium: There is no evidence of pericardial effusion. Mitral Valve: The mitral valve is normal in structure. Moderate mitral valve regurgitation. Tricuspid Valve: The tricuspid valve is normal in structure. Tricuspid valve regurgitation is moderate. Aortic Valve: The aortic valve is tricuspid. Aortic valve regurgitation is trivial. No aortic stenosis is present. Pulmonic Valve: The pulmonic valve was grossly normal. Pulmonic valve regurgitation is moderate. Aorta: The aortic root is normal in size and structure. IAS/Shunts: No atrial level shunt detected by color flow Doppler.  LEFT VENTRICLE PLAX 2D LVIDd:         6.10 cm LVIDs:         4.70 cm LV PW:         0.90 cm          3D Volume EF LV IVS:        0.80 cm         LV 3D EF:    Left LVOT diam:     2.10 cm                      ventricul LV SV:         84                           ar LV SV Index:   45                           ejection LVOT Area:     3.46 cm  fraction                                             by 3D                                             volume is LV Volumes (MOD)                            29 %. LV vol d, MOD    201.0 ml A2C: LV vol d, MOD    152.0 ml      3D Volume EF: A4C:                           3D EF:        29 % LV vol s, MOD    133.0 ml      LV EDV:       220 ml A2C:                           LV ESV:       155 ml LV vol s, MOD    108.0 ml      LV SV:        65 ml A4C: LV SV MOD A2C:   68.0 ml LV SV MOD A4C:   152.0 ml LV SV MOD BP:    59.2 ml RIGHT VENTRICLE TAPSE (M-mode): 1.3 cm LEFT ATRIUM             Index        RIGHT ATRIUM           Index LA diam:        4.20 cm 2.25 cm/m   RA Area:     23.70 cm LA Vol (A2C):   86.8 ml 46.58 ml/m  RA Volume:   81.90 ml  43.95 ml/m LA Vol (A4C):   75.3 ml 40.41 ml/m LA Biplane Vol: 81.4 ml 43.68 ml/m  AORTIC VALVE             PULMONIC VALVE LVOT Vmax:   136.33 cm/s PR End Diast Vel: 8.29 msec LVOT Vmean:  91.133 cm/s LVOT VTI:    0.243 m  AORTA Ao Root diam: 3.10 cm Ao Asc diam:  3.60 cm TRICUSPID VALVE TR Peak grad:   28.9 mmHg TR Vmax:        269.00 cm/s  SHUNTS Systemic VTI:  0.24 m Systemic Diam: 2.10 cm Adrian Prows MD Electronically signed by Adrian Prows MD Signature Date/Time: 03/26/2022/8:41:38 PM    Final    US Abdomen Limited RUQ (LIVER/GB)  Result Date: 03/25/2022 CLINICAL DATA:  Elevated liver function test. EXAM: ULTRASOUND ABDOMEN LIMITED RIGHT UPPER QUADRANT COMPARISON:  None Available. FINDINGS: Gallbladder: No gallstones are visualized. The gallbladder wall measures 3.62 mm in thickness. A moderate amount of pericholecystic fluid is seen. No sonographic Murphy sign noted by sonographer. Common bile duct: Diameter: 1.63 mm  Liver: No focal lesion identified. Within normal limits in parenchymal echogenicity. Portal vein is patent on color Doppler imaging with normal direction of blood flow towards the liver. Other: A trace  amount of free fluid is seen along the inferior aspect of the right lobe of the liver. IMPRESSION: 1. Gallbladder wall thickening and pericholecystic fluid without evidence of cholelithiasis or acute cholecystitis. 2. Trace amount of perihepatic free fluid. Electronically Signed   By: Virgina Norfolk M.D.   On: 03/25/2022 22:10   DG Chest Port 1 View  Result Date: 03/25/2022 CLINICAL DATA:  Tremors. EXAM: PORTABLE CHEST 1 VIEW COMPARISON:  March 12, 2022 FINDINGS: Stable moderate to marked severity cardiac silhouette enlargement is noted. Stable, mild to moderate severity diffusely increased interstitial lung markings are seen. Mild atelectasis and/or infiltrate is suspected along the infrahilar region on the left. There is no evidence of a pleural effusion or pneumothorax. The visualized skeletal structures are unremarkable. IMPRESSION: 1. Stable moderate to marked severity cardiac silhouette enlargement. 2. Chronic interstitial lung disease with mild left infrahilar atelectasis and/or infiltrate. Electronically Signed   By: Virgina Norfolk M.D.   On: 03/25/2022 18:54        Scheduled Meds:  atorvastatin  20 mg Oral QHS   carvedilol  3.125 mg Oral BID WC   cloNIDine  0.1 mg Oral TID AC & HS   Followed by   Derrill Memo ON 03/29/2022] cloNIDine  0.1 mg Oral BH-qamhs   Followed by   Derrill Memo ON 03/31/2022] cloNIDine  0.1 mg Oral QAC breakfast   ivabradine  5 mg Oral BID WC   methylPREDNISolone (SOLU-MEDROL) injection  80 mg Intravenous Daily   sodium chloride flush  3 mL Intravenous Q12H   Continuous Infusions:   ceFAZolin (ANCEF) IV 2 g (03/27/22 0610)     LOS: 2 days    Time spent: 45 minutes spent on chart review, discussion with nursing staff, consultants, updating family and  interview/physical exam; more than 50% of that time was spent in counseling and/or coordination of care.    Geradine Girt, DO [facility] Available via Epic secure chat 7am-7pm After these hours, please refer to coverage provider listed on [email or web address] 03/27/2022, 12:06 PM

## 2022-03-27 NOTE — Progress Notes (Addendum)
I have seen and examined the patient. I have personally reviewed the clinical findings, laboratory findings, microbiological data and imaging studies. The assessment and treatment plan was discussed with the Nurse Practitioner Janene Madeira.  I agree with her/his recommendations except following additions/corrections.  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 TTE 10/8 with no vegetations or endocarditis  Afebrile, WBC stable  Screams being touched any where in the lower extremities  Clinical exam not concerning for nec fasc. ROM of bilateral ankle good. Bilateral DP palpable. Wound care notes from today reviewed witrh concerns of PAD  Repeat 2 sets of blood cx today given GPC bacteremia for clearance Fu ID of GNR from 10/7 blood cx from micro Will switch cefazolin to ceftriaxone pending ID of GNR to cover GAS as well as broaden coverage for GNR Following   Rosiland Oz, MD Infectious Disease Physician [facility] for Infectious Disease [address] [place], [place] [address] Phone: [phone]  Fax: [facility] for Infectious Disease  Date of Admission:  03/25/2022      Total days of antibiotics 2   Cefazolin 10/08 >> current          ASSESSMENT:  Valerie Rodgers is a 71 y.o. female admitted from community home/boardinghouse for evaluation of tremor and somnolence; found to have group a streptococcus bacteremia. She does not report any significant localizing symptoms outside of the pain in her legs. ?cellulitis to these wounds. On exam there does seem to be a little more erythema/hyperemia to the L>R. She is tender to both legs no matter where you touch.Non-purulent chronic wounds due to venous stasis, arterial disease vs cocaine component as well. [place] team recommended against wrapping for now given poorly assessed arterial system.   She  also has a GNR growing in one of the blood cultures that was not picked up on BCID. Waiting for an update from micro. Continue cefazolin for now pending this. Anticipate D/C with oral abx given source of illness seems r/t leg wounds.   Transaminitis + intranasal drug use - hepatitis panel non-reactive, hep b non-reactive sAg. Will check RPR    PLAN: Continue cefazolin  Follow initial BCx for ID on GNR growing Follow repeat BCx Wound care per primary team recs.  AECOPD tx per primary    Principal Problem:   Acute on chronic respiratory failure with hypoxia and hypercapnia (HCC) Active Problems:   HFrEF (heart failure with reduced ejection fraction) (HCC)   Elevated LFTs   Atrial flutter, paroxysmal (HCC)   Stage 3a chronic kidney disease (CKD) (HCC)   COPD with acute exacerbation (HCC)   Decubitus ulcer of left buttock   Elevated troponin   Cellulitis   Acute encephalopathy   Severe sepsis without septic shock (HCC)   Bacteremia    atorvastatin  20 mg Oral QHS   carvedilol  3.125 mg Oral BID WC   cloNIDine  0.1 mg Oral TID AC & HS   Followed by   Derrill Memo ON 03/29/2022] cloNIDine  0.1 mg Oral BH-qamhs   Followed by   Derrill Memo ON 03/31/2022] cloNIDine  0.1 mg Oral QAC breakfast   ivabradine  5 mg Oral BID WC   methylPREDNISolone (SOLU-MEDROL) injection  80 mg Intravenous Daily   sodium chloride flush  3 mL Intravenous Q12H    SUBJECTIVE: Doing OK today - no specific concerns,  mentions that she keeps getting these wounds due to her cocaine use. Last use was 2 weeks prior to admission. She is not certain she can stop using.  She does feel better. Has a lot of pain in the legs about her chronic wounds.    Review of Systems: Review of Systems  Constitutional:  Negative for chills and fever.  Eyes: Negative.   Respiratory: Negative.    Cardiovascular: Negative.   Genitourinary: Negative.   Musculoskeletal: Negative.   Skin:        Wounds to lower legs.    Past Medical  History:  Diagnosis Date   Arthritis    Bipolar affective disorder [facility])    Cataract    Past Surgical History:  Procedure Laterality Date   ABDOMINAL HYSTERECTOMY       No Known Allergies  OBJECTIVE: Vitals:   03/27/22 0300 03/27/22 0828 03/27/22 0830 03/27/22 1138  BP: 123/77 108/76 108/76 121/81  Pulse: 76 90 87   Resp: 20  17   Temp: 98.7 F (37.1 C)     TempSrc: Oral     SpO2: 97%  94%   Weight:      Height:       Body mass index is 30.61 kg/m.  Physical Exam Vitals reviewed.  Constitutional:      Appearance: Normal appearance. She is not ill-appearing.  HENT:     Mouth/Throat:     Mouth: Mucous membranes are moist.     Pharynx: Oropharynx is clear.     Comments: Poor dentition Cardiovascular:     Rate and Rhythm: Normal rate and regular rhythm.  Pulmonary:     Effort: Pulmonary effort is normal.     Breath sounds: Normal breath sounds.  Musculoskeletal:        General: Normal range of motion.  Skin:    Comments: Bilateral stasis dermatitis to LEs with scattered full thickness ulcers/wounds circumferentially to both shins/calves. Does have a red/hyperemic appearance L>R but not overtly hot. Some clear/yellow drainage noted.   Neurological:     Mental Status: She is alert.        Lab Results Lab Results  Component Value Date   WBC 15.0 (H) 03/27/2022   HGB 11.4 (L) 03/27/2022   HCT 36.8 03/27/2022   MCV 86.8 03/27/2022   PLT PLATELET CLUMPS NOTED ON SMEAR, UNABLE TO ESTIMATE 03/27/2022    Lab Results  Component Value Date   CREATININE 1.46 (H) 03/27/2022   BUN 33 (H) 03/27/2022   NA 146 (H) 03/27/2022   K 4.9 03/27/2022   CL 105 03/27/2022   CO2 25 03/27/2022    Lab Results  Component Value Date   ALT 44 03/27/2022   AST 49 (H) 03/27/2022   ALKPHOS 133 (H) 03/27/2022   BILITOT 0.4 03/27/2022     Microbiology: Recent Results (from the past 240 hour(s))  Blood culture (routine x 2)     Status: None (Preliminary result)   Collection  Time: 03/25/22  5:33 PM   Specimen: BLOOD  Result Value Ref Range Status   Specimen Description BLOOD LEFT ANTECUBITAL  Final   Special Requests   Final    BOTTLES DRAWN AEROBIC AND ANAEROBIC Blood Culture results may not be optimal due to an inadequate volume of blood received in culture bottles   Culture   Final    NO GROWTH < 12 HOURS Performed at [facility] [address], [place],  [address]    Report Status PENDING  Incomplete  Blood culture (routine x 2)     Status: None (Preliminary result)   Collection Time: 03/25/22  5:38 PM   Specimen: BLOOD  Result Value Ref Range Status   Specimen Description BLOOD BLOOD LEFT HAND  Final   Special Requests   Final    BOTTLES DRAWN AEROBIC AND ANAEROBIC Blood Culture results may not be optimal due to an inadequate volume of blood received in culture bottles   Culture  Setup Time   Final    GRAM POSITIVE COCCI IN CLUSTERS AEROBIC BOTTLE ONLY CRITICAL RESULT CALLED TO, READ BACK BY AND VERIFIED WITH: PHARMD C.PIERCE AT 1223 ON 03/26/2022 BY T.SAAD. GRAM NEGATIVE RODS CRITICAL RESULT CALLED TO, READ BACK BY AND VERIFIED WITH: J. FRENS PHARMD, AT [id number] 03/27/22 D. VANHOOK Performed at [facility], [place] [address] [place], [place] [address]    Culture GRAM POSITIVE COCCI GRAM NEGATIVE RODS   Final   Report Status PENDING  Incomplete  Blood Culture ID Panel (Reflexed)     Status: Abnormal   Collection Time: 03/25/22  5:38 PM  Result Value Ref Range Status   Enterococcus faecalis NOT DETECTED NOT DETECTED Final   Enterococcus Faecium NOT DETECTED NOT DETECTED Final   Listeria monocytogenes NOT DETECTED NOT DETECTED Final   Staphylococcus species NOT DETECTED NOT DETECTED Final   Staphylococcus aureus (BCID) NOT DETECTED NOT DETECTED Final   Staphylococcus epidermidis NOT DETECTED NOT DETECTED Final   Staphylococcus lugdunensis NOT DETECTED NOT DETECTED Final   Streptococcus species DETECTED (A) NOT DETECTED Final     Comment: CRITICAL RESULT CALLED TO, READ BACK BY AND VERIFIED WITH: PHARMD C.PIERCE AT 1223 ON 03/26/2022 BY T.SAAD.    Streptococcus agalactiae NOT DETECTED NOT DETECTED Final   Streptococcus pneumoniae NOT DETECTED NOT DETECTED Final   Streptococcus pyogenes DETECTED (A) NOT DETECTED Final    Comment: CRITICAL RESULT CALLED TO, READ BACK BY AND VERIFIED WITH: PHARMD C.PIERCE AT 1223 ON 03/26/2022 BY T.SAAD.    A.calcoaceticus-baumannii NOT DETECTED NOT DETECTED Final   Bacteroides fragilis NOT DETECTED NOT DETECTED Final   Enterobacterales NOT DETECTED NOT DETECTED Final   Enterobacter cloacae complex NOT DETECTED NOT DETECTED Final   Escherichia coli NOT DETECTED NOT DETECTED Final   Klebsiella aerogenes NOT DETECTED NOT DETECTED Final   Klebsiella oxytoca NOT DETECTED NOT DETECTED Final   Klebsiella pneumoniae NOT DETECTED NOT DETECTED Final   Proteus species NOT DETECTED NOT DETECTED Final   Salmonella species NOT DETECTED NOT DETECTED Final   Serratia marcescens NOT DETECTED NOT DETECTED Final   Haemophilus influenzae NOT DETECTED NOT DETECTED Final   Neisseria meningitidis NOT DETECTED NOT DETECTED Final   Pseudomonas aeruginosa NOT DETECTED NOT DETECTED Final   Stenotrophomonas maltophilia NOT DETECTED NOT DETECTED Final   Candida albicans NOT DETECTED NOT DETECTED Final   Candida auris NOT DETECTED NOT DETECTED Final   Candida glabrata NOT DETECTED NOT DETECTED Final   Candida krusei NOT DETECTED NOT DETECTED Final   Candida parapsilosis NOT DETECTED NOT DETECTED Final   Candida tropicalis NOT DETECTED NOT DETECTED Final   Cryptococcus neoformans/gattii NOT DETECTED NOT DETECTED Final    Comment: Performed at [facility], [place] [address] [place], [place] [address]  SARS Coronavirus 2 by RT PCR (hospital order, performed in [facility] hospital lab) *cepheid single result test* Anterior Nasal Swab     Status: None   Collection Time: 03/25/22  5:42 PM   Specimen:  Anterior Nasal Swab  Result  Value Ref Range Status   SARS Coronavirus 2 by RT PCR NEGATIVE NEGATIVE Final    Comment: (NOTE) SARS-CoV-2 target nucleic acids are NOT DETECTED.  The SARS-CoV-2 RNA is generally detectable in upper and lower respiratory specimens during the acute phase of infection. The lowest concentration of SARS-CoV-2 viral copies this assay can detect is 250 copies / mL. A negative result does not preclude SARS-CoV-2 infection and should not be used as the sole basis for treatment or other patient management decisions.  A negative result may occur with improper specimen collection / handling, submission of specimen other than nasopharyngeal swab, presence of viral mutation(s) within the areas targeted by this assay, and inadequate number of viral copies (<250 copies / mL). A negative result must be combined with clinical observations, patient history, and epidemiological information.  Fact Sheet for Patients:   https://www.patel.info/  Fact Sheet for Healthcare Providers: https://hall.com/  This test is not yet approved or  cleared by the [place] FDA and has been authorized for detection and/or diagnosis of SARS-CoV-2 by FDA under an Emergency Use Authorization (EUA).  This EUA will remain in effect (meaning this test can be used) for the duration of the COVID-19 declaration under Section 564(b)(1) of the Act, 21 U.S.C. section 360bbb-3(b)(1), unless the authorization is terminated or revoked sooner.  Performed at [facility], [place] [address] [place], [place] [address]   MRSA Next Gen by PCR, Nasal     Status: None   Collection Time: 03/26/22  2:05 AM   Specimen: Nasal Mucosa; Nasal Swab  Result Value Ref Range Status   MRSA by PCR Next Gen NOT DETECTED NOT DETECTED Final    Comment: (NOTE) The GeneXpert MRSA Assay (FDA approved for NASAL specimens only), is one component of a comprehensive MRSA  colonization surveillance program. It is not intended to diagnose MRSA infection nor to guide or monitor treatment for MRSA infections. Test performance is not FDA approved in patients less than 67 years old. Performed at [facility], [place] [address], [place], [place] [address]    Imaging ECHOCARDIOGRAM COMPLETE  Result Date: 03/26/2022    ECHOCARDIOGRAM REPORT   Patient Name:   Valerie Rodgers Date of Exam: 03/26/2022 Medical Rec #:  [id number]       Height:       64.0 in Accession #:    [id number]      Weight:       178.4 lb Date of Birth:  Nov 09, 1950        BSA:          1.863 m Patient Age:    71 years        BP:           102/74 mmHg Patient Gender: F               HR:           79 bpm. Exam Location:  Inpatient Procedure: 2D Echo, 3D Echo, Color Doppler, Cardiac Doppler and Intracardiac            Opacification Agent Indications:     Elevated Troponin  History:         Patient has prior history of Echocardiogram examinations, most                  recent 11/15/2021. COPD; Arrythmias:Atrial Fibrillation.  Sonographer:     Bernadene Person RDCS Referring Phys:  [id number] [place] OPYD Diagnosing Phys: Adrian Prows  MD IMPRESSIONS  1. 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 Left ventricular ejection fraction, by estimation, is 20 to 25%. Left ventricular ejection fraction by 3D volume is 29 %. The left ventricle has severely decreased function. The left ventricle demonstrates global hypokinesis. Left ventricular diastolic function could not be evaluated. There is the interventricular septum is flattened in systole and diastole, consistent with right ventricular pressure and volume overload.  2. Right ventricular systolic function is normal. The right ventricular size is normal. There is moderately elevated pulmonary artery systolic pressure.  3. Left atrial size was moderately dilated.  4. Right atrial size was moderately dilated.  5. The mitral valve is normal in structure. Moderate mitral  valve regurgitation.  6. Tricuspid valve regurgitation is moderate.  7. The aortic valve is tricuspid. Aortic valve regurgitation is trivial. No aortic stenosis is present.  8. Pulmonic valve regurgitation is moderate. Comparison(s): Compared to 05/302023, EF marginally better from 20% to 25%. 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 Left ventricular ejection fraction, by estimation, is 20 to 25%. Left ventricular ejection fraction by 3D volume is 29 %. The left ventricle has severely decreased function. The left ventricle demonstrates global hypokinesis. Definity contrast agent was given IV to delineate the left ventricular endocardial borders. The left ventricular internal cavity size was normal in size. There is no left ventricular hypertrophy. The interventricular septum is flattened in systole and diastole, consistent with right ventricular pressure and volume overload. Left ventricular diastolic function could not be evaluated. Right Ventricle: The right ventricular size is normal. No increase in right ventricular wall thickness. Right ventricular systolic function is normal. There is moderately elevated pulmonary artery systolic pressure. The tricuspid regurgitant velocity is 2.69 m/s, and with an assumed right atrial pressure of 15 mmHg, the estimated right ventricular systolic pressure is [id number] mmHg. Left Atrium: Left atrial size was moderately dilated. Right Atrium: Right atrial size was moderately dilated. Pericardium: There is no evidence of pericardial effusion. Mitral Valve: The mitral valve is normal in structure. Moderate mitral valve regurgitation. Tricuspid Valve: The tricuspid valve is normal in structure. Tricuspid valve regurgitation is moderate. Aortic Valve: The aortic valve is tricuspid. Aortic valve regurgitation is trivial. No aortic stenosis is present. Pulmonic Valve: The pulmonic valve was grossly normal. Pulmonic valve regurgitation is moderate.  Aorta: The aortic root is normal in size and structure. IAS/Shunts: No atrial level shunt detected by color flow Doppler.  LEFT VENTRICLE PLAX 2D LVIDd:         6.10 cm LVIDs:         4.70 cm LV PW:         0.90 cm         3D Volume EF LV IVS:        0.80 cm         LV 3D EF:    Left LVOT diam:     2.10 cm                      ventricul LV SV:         84                           ar LV SV Index:   45                           ejection LVOT Area:     3.46  cm                     fraction                                             by 3D                                             volume is LV Volumes (MOD)                            29 %. LV vol d, MOD    201.0 ml A2C: LV vol d, MOD    152.0 ml      3D Volume EF: A4C:                           3D EF:        29 % LV vol s, MOD    133.0 ml      LV EDV:       220 ml A2C:                           LV ESV:       155 ml LV vol s, MOD    108.0 ml      LV SV:        65 ml A4C: LV SV MOD A2C:   68.0 ml LV SV MOD A4C:   152.0 ml LV SV MOD BP:    59.2 ml RIGHT VENTRICLE TAPSE (M-mode): 1.3 cm LEFT ATRIUM             Index        RIGHT ATRIUM           Index LA diam:        4.20 cm 2.25 cm/m   RA Area:     23.70 cm LA Vol (A2C):   86.8 ml 46.58 ml/m  RA Volume:   81.90 ml  43.95 ml/m LA Vol (A4C):   75.3 ml 40.41 ml/m LA Biplane Vol: 81.4 ml 43.68 ml/m  AORTIC VALVE             PULMONIC VALVE LVOT Vmax:   136.33 cm/s PR End Diast Vel: 8.29 msec LVOT Vmean:  91.133 cm/s LVOT VTI:    0.243 m  AORTA Ao Root diam: 3.10 cm Ao Asc diam:  3.60 cm TRICUSPID VALVE TR Peak grad:   28.9 mmHg TR Vmax:        269.00 cm/s  SHUNTS Systemic VTI:  0.24 m Systemic Diam: 2.10 cm Adrian Prows MD Electronically signed by Adrian Prows MD Signature Date/Time: 03/26/2022/8:41:38 PM    Final    US Abdomen Limited RUQ (LIVER/GB)  Result Date: 03/25/2022 CLINICAL DATA:  Elevated liver function test. EXAM: ULTRASOUND ABDOMEN LIMITED RIGHT UPPER QUADRANT COMPARISON:  None Available. FINDINGS:  Gallbladder: No gallstones are visualized. The gallbladder wall measures 3.62 mm in thickness. A moderate amount of pericholecystic fluid is seen. No sonographic Murphy sign noted by sonographer. Common bile duct: Diameter: 1.63 mm Liver: No focal lesion identified. Within normal limits in parenchymal  echogenicity. Portal vein is patent on color Doppler imaging with normal direction of blood flow towards the liver. Other: A trace amount of free fluid is seen along the inferior aspect of the right lobe of the liver. IMPRESSION: 1. Gallbladder wall thickening and pericholecystic fluid without evidence of cholelithiasis or acute cholecystitis. 2. Trace amount of perihepatic free fluid. Electronically Signed   By: Virgina Norfolk M.D.   On: 03/25/2022 22:10   DG Chest Port 1 View  Result Date: 03/25/2022 CLINICAL DATA:  Tremors. EXAM: PORTABLE CHEST 1 VIEW COMPARISON:  March 12, 2022 FINDINGS: Stable moderate to marked severity cardiac silhouette enlargement is noted. Stable, mild to moderate severity diffusely increased interstitial lung markings are seen. Mild atelectasis and/or infiltrate is suspected along the infrahilar region on the left. There is no evidence of a pleural effusion or pneumothorax. The visualized skeletal structures are unremarkable. IMPRESSION: 1. Stable moderate to marked severity cardiac silhouette enlargement. 2. Chronic interstitial lung disease with mild left infrahilar atelectasis and/or infiltrate. Electronically Signed   By: Virgina Norfolk M.D.   On: 03/25/2022 18:54   DG Chest Port 1 View  Result Date: 03/12/2022 CLINICAL DATA:  Leg swelling EXAM: PORTABLE CHEST 1 VIEW COMPARISON:  11/13/2021 FINDINGS: Cardiomegaly with vascular congestion and bilateral airspace disease compatible with edema/CHF. No visible effusions or acute bony abnormality. IMPRESSION: Mild to moderate CHF Electronically Signed   By: Rolm Baptise M.D.   On: 03/12/2022 19:24     Janene Madeira, MSN,  NP-C [facility] for Infectious Disease [email or web address] Pager: [phone] Office: [phone] [facility] Main Line: [place] Communication Welcome

## 2022-03-27 NOTE — Progress Notes (Signed)
PT Cancellation Note  Patient Details Name: Valerie Rodgers MRN: [id number] DOB: 10/05/1950   Cancelled Treatment:    Reason Eval/Treat Not Completed: Other (comment) (Pt currently with wounds exposed awaiting pictures and wound care for dressing prior to mobility. Will plan to reattempt)   Lenvil Swaim B Fin Hupp 03/27/2022, 9:37 AM [place] Office: [phone]

## 2022-03-27 NOTE — Progress Notes (Addendum)
Received a call from bedside RN regarding the patient becoming acutely hypotensive with systolic BP in the 37J and MAP in the low 60s with hypersomnolence.  250 cc normal saline bolus ordered to be administered x1.  Patient on midodrine prior to admission, this was also administered, with an order for 10 mg 3 times daily x3 doses.  DC'd clonidine to avoid worsening hypotension.    Presented at bedside.  The patient is more alert with improvement of her BP, after IV fluid bolus and midodrine administration.  She denies having any chest pain or dyspnea at rest.  We will continue to closely monitor and treat as indicated.

## 2022-03-27 NOTE — Care Management Important Message (Signed)
Important Message  Patient Details  Name: Valerie Rodgers MRN: [id number] Date of Birth: 07-27-1950   Medicare Important Message Given:  Yes     Orbie Pyo 03/27/2022, 4:15 PM

## 2022-03-27 NOTE — Evaluation (Signed)
Physical Therapy Evaluation Patient Details Name: Valerie Rodgers MRN: [id number] DOB: [birth year] Today's Date: 03/27/2022  History of Present Illness  Pt is 71 yo female admitted 03/25/22 with tremor and somnolence, with COPD exacerbation. PMH to include Afib, COPD on supplemental O2, leg wounds, CHF, substance abuse, bipolar, CKD, and cirrhosis.   Clinical Impression  Pt presents with an overall decrease in functional mobility secondary to above. PTA, pt reported living in boarding house, independent community Ambulator with assistance as needed from house mates for IADLs. Educ on importance of decreasing stationary time in bed and progressing mobility. Today, pt able to transfer and perform step pivot, minA with max cues for safety and RW negotiation during task.  SpO2 monitoring 85-91% during mobility and 3L O2, titrated to 4L sitting upright in recliner. Pt alert to person, place, time, but is poor historian with inconsistent responses to repeated question. Pt would benefit from continued acute PT services to maximize functional mobility and independence prior to d/c. Recommending d/c to SNF, pt verbalizes refusal, would recommend maximizing HH services if continues to refuse.      Recommendations for follow up therapy are one component of a multi-disciplinary discharge planning process, led by the attending physician.  Recommendations may be updated based on patient status, additional functional criteria and insurance authorization.  Follow Up Recommendations Skilled nursing-short term rehab (<3 hours/day) Can patient physically be transported by private vehicle: Yes    Assistance Recommended at Discharge Frequent or constant Supervision/Assistance  Patient can return home with the following  A lot of help with bathing/dressing/bathroom;Assistance with cooking/housework;Assist for transportation;Help with stairs or ramp for entrance;A little help with walking and/or transfers    Equipment  Recommendations None recommended by PT  Recommendations for Other Services       Functional Status Assessment Patient has had a recent decline in their functional status and demonstrates the ability to make significant improvements in function in a reasonable and predictable amount of time.     Precautions / Restrictions Precautions Precautions: Fall Restrictions Weight Bearing Restrictions: No      Mobility  Bed Mobility Overal bed mobility: Needs Assistance Bed Mobility: Supine to Sit     Supine to sit: HOB elevated, Min assist     General bed mobility comments: Min assist for trunk support for last 25% of mobility task, guidance of LEs due to inc in pain with activity    Transfers Overall transfer level: Needs assistance Equipment used: Rolling walker (2 wheels) Transfers: Sit to/from Stand, Bed to chair/wheelchair/BSC Sit to Stand: Min assist   Step pivot transfers: Min assist       General transfer comment: Min A for sit to stand, cues for upright trunk control and appropriate placement of hands on RW. Step pivot with max cues for negotiating turn with RW    Ambulation/Gait                  Stairs            Wheelchair Mobility    Modified Rankin (Stroke Patients Only)       Balance Overall balance assessment: Needs assistance Sitting-balance support: Single extremity supported, Feet supported Sitting balance-Leahy Scale: Fair Sitting balance - Comments: Pt with whole body tremor during sitting EOB, requiring supervision-min guard due to unsteadiness   Standing balance support: During functional activity, Reliant on assistive device for balance, Bilateral upper extremity supported Standing balance-Leahy Scale: Poor Standing balance comment: RW and min assist for static standing, max  cues for safety with RW                             Pertinent Vitals/Pain Pain Assessment Faces Pain Scale: Hurts even more Pain Location:  wounds on BLEs Pain Descriptors / Indicators: Constant, Grimacing Pain Intervention(s): Monitored during session, Limited activity within patient's tolerance    Home Living Family/patient expects to be discharged to:: Group home                   Additional Comments: Pt verbalies everything she needs is on main floor, lives at boarding home with verbal report of assistance as needed from others in the home. 1 step to enter no hand rails.    Prior Function Prior Level of Function : Patient poor historian/Family not available;Needs assist       Physical Assist : Mobility (physical);ADLs (physical) Mobility (physical): Gait ADLs (physical): IADLs Mobility Comments: Pt states inconsistent cane usage, however pt is unreliable narrator ADLs Comments: Pt states independent in self care, needs assistance for IADLs     Hand Dominance        Extremity/Trunk Assessment   Upper Extremity Assessment Upper Extremity Assessment: Generalized weakness (able to grip RW with functional strength)    Lower Extremity Assessment Lower Extremity Assessment: Generalized weakness (AROM of legs during mobility and ambulation)    Cervical / Trunk Assessment Cervical / Trunk Assessment: Kyphotic  Communication      Cognition Arousal/Alertness: Awake/alert Behavior During Therapy: Impulsive Overall Cognitive Status: No family/caregiver present to determine baseline cognitive functioning Area of Impairment: Attention, Memory, Following commands, Safety/judgement, Awareness, Problem solving                   Current Attention Level: Sustained Memory: Decreased short-term memory Following Commands: Follows one step commands inconsistently, Follows one step commands with increased time Safety/Judgement: Decreased awareness of safety, Decreased awareness of deficits Awareness: Intellectual Problem Solving: Slow processing, Requires verbal cues, Requires tactile cues, Difficulty  sequencing, Decreased initiation General Comments: Pt frequently directs conversation to irrelevant topics. Needs consistent cuing to maintain focus to task at hand.        General Comments General comments (skin integrity, edema, etc.): pt is unreliable narrator with whole body tremor when sitting EOB. SpO2 85-91% on 3L during mobility, increased to 4L sitting up in chair    Exercises     Assessment/Plan    PT Assessment Patient needs continued PT services  PT Problem List Decreased strength;Decreased activity tolerance;Decreased balance;Decreased mobility;Decreased cognition;Decreased knowledge of use of DME;Decreased safety awareness       PT Treatment Interventions DME instruction;Gait training;Functional mobility training;Therapeutic activities;Therapeutic exercise;Balance training;Patient/family education;Cognitive remediation    PT Goals (Current goals can be found in the Care Plan section)  Acute Rehab PT Goals Patient Stated Goal: wants to go home PT Goal Formulation: With patient Time For Goal Achievement: 04/10/22 Potential to Achieve Goals: Fair    Frequency Min 3X/week     Co-evaluation               AM-PAC PT "6 Clicks" Mobility  Outcome Measure Help needed turning from your back to your side while in a flat bed without using bedrails?: A Little Help needed moving from lying on your back to sitting on the side of a flat bed without using bedrails?: A Lot Help needed moving to and from a bed to a chair (including a wheelchair)?: A Lot Help needed  standing up from a chair using your arms (e.g., wheelchair or bedside chair)?: A Lot Help needed to walk in hospital room?: A Lot Help needed climbing 3-5 steps with a railing? : Total 6 Click Score: 12    End of Session Equipment Utilized During Treatment: Oxygen;Gait belt Activity Tolerance: Patient limited by fatigue Patient left: with call bell/phone within reach;in chair;with chair alarm set Nurse  Communication: Mobility status;Other (comment) (O2 status) PT Visit Diagnosis: Unsteadiness on feet (R26.81);Other abnormalities of gait and mobility (R26.89);Muscle weakness (generalized) (M62.81)    Time: 6812-7517 PT Time Calculation (min) (ACUTE ONLY): 34 min   Charges:   PT Evaluation $PT Eval Moderate Complexity: 1 Mod PT Treatments $Therapeutic Activity: 8-22 mins       Chipper Oman, SPT   Valerie Rodgers 03/27/2022, 11:50 AM

## 2022-03-28 DIAGNOSIS — J9621 Acute and chronic respiratory failure with hypoxia: Secondary | ICD-10-CM | POA: Diagnosis not present

## 2022-03-28 DIAGNOSIS — J9622 Acute and chronic respiratory failure with hypercapnia: Secondary | ICD-10-CM | POA: Diagnosis not present

## 2022-03-28 LAB — BASIC METABOLIC PANEL
Anion gap: 8 (ref 5–15)
BUN: 34 mg/dL — ABNORMAL HIGH (ref 8–23)
CO2: 29 mmol/L (ref 22–32)
Calcium: 7.8 mg/dL — ABNORMAL LOW (ref 8.9–10.3)
Chloride: 102 mmol/L (ref 98–111)
Creatinine, Ser: 1.21 mg/dL — ABNORMAL HIGH (ref 0.44–1.00)
GFR, Estimated: 48 mL/min — ABNORMAL LOW (ref >60)
Glucose, Bld: 150 mg/dL — ABNORMAL HIGH (ref 70–99)
Potassium: 3.8 mmol/L (ref 3.5–5.1)
Sodium: 139 mmol/L (ref 135–145)

## 2022-03-28 LAB — MAGNESIUM: Magnesium: 1.9 mg/dL (ref 1.7–2.4)

## 2022-03-28 LAB — RPR: RPR Ser Ql: NONREACTIVE

## 2022-03-28 LAB — LACTIC ACID, PLASMA: Lactic Acid, Venous: 2.1 mmol/L (ref 0.5–1.9)

## 2022-03-28 LAB — PHOSPHORUS: Phosphorus: 2.8 mg/dL (ref 2.5–4.6)

## 2022-03-28 MED ORDER — SODIUM CHLORIDE 0.9 % IV SOLN
3.0000 g | Freq: Four times a day (QID) | INTRAVENOUS | Status: AC
  Administered 2022-03-28 – 2022-03-30 (×11): 3 g via INTRAVENOUS
  Filled 2022-03-28 (×11): qty 8

## 2022-03-28 MED ORDER — MIDODRINE HCL 5 MG PO TABS
5.0000 mg | ORAL_TABLET | Freq: Three times a day (TID) | ORAL | Status: AC
  Administered 2022-03-29 (×3): 5 mg via ORAL
  Filled 2022-03-28 (×3): qty 1

## 2022-03-28 NOTE — Progress Notes (Signed)
       RE:   Valerie Rodgers   Date of Birth:  [birth year]   Date:   03/28/2022      To Whom It May Concern:  Please be advised that the above-named patient will require a short-term nursing home stay - anticipated 30 days or less for rehabilitation and strengthening.  The plan is for return home.

## 2022-03-28 NOTE — TOC Progression Note (Signed)
Transition of Care [facility]) - Progression Note    Patient Details  Name: Valerie Rodgers MRN: [id number] Date of Birth: Jan 10, 1951  Transition of Care [facility]) CM/SW [place], [place] Phone Number: 03/28/2022, 12:21 PM  Clinical Narrative:     CSW is informed by [facility] that pt is agreeable to SNF workup. Fl2 completed and bed requests faxed in hub. PASRR pending; documents have been uploaded to [place] must.   Expected Discharge Plan: Skilled Nursing Facility Barriers to Discharge: Continued Medical Work up  Expected Discharge Plan and Services Expected Discharge Plan: [place]   Discharge Planning Services: CM Consult Post Acute Care Choice: [place] Living arrangements for the past 2 months: [place]                 DME Arranged: N/A DME Agency: NA       HH Arranged: NA           Social Determinants of Health (SDOH) Interventions    Readmission Risk Interventions    03/17/2022    2:55 PM  Readmission Risk Prevention Plan  HRI or Home Care Consult Complete  Social Work Consult for [place] Planning/Counseling Complete  Palliative Care Screening Not Applicable  Medication Review Press photographer) Referral to Pharmacy

## 2022-03-28 NOTE — TOC Initial Note (Signed)
Transition of Care [facility]) - Initial/Assessment Note    Patient Details  Name: Valerie Rodgers MRN: [id number] Date of Birth: 07-14-1950  Transition of [facility]) CM/SW Contact:    Marilu Favre, RN Phone Number: 03/28/2022, 11:44 AM  Clinical Narrative:                 Patient from boarding house.  Has PCP but unsure of name   PT recommending SNF for short term rehab.   Patient in agreement   TOC SW aware and will begin SNF work up.  Expected Discharge Plan: Skilled Nursing Facility Barriers to Discharge: Continued Medical Work up   Patient Goals and CMS Choice Patient states their goals for this hospitalization and ongoing recovery are:: to get stronger      Expected Discharge Plan and Services Expected Discharge Plan: [place]   Discharge Planning Services: CM Consult Post Acute Care Choice: [place] Living arrangements for the past 2 months: [place]                 DME Arranged: N/A DME Agency: NA       HH Arranged: NA          Prior Living Arrangements/Services Living arrangements for the past 2 months: [place] with:: Self Patient language and need for interpreter reviewed:: Yes Do you feel safe going back to the place where you live?: Yes      Need for Family Participation in Patient Care: Yes (Comment) Care giver support system in place?: No (comment)   Criminal Activity/Legal Involvement Pertinent to Current Situation/Hospitalization: No - Comment as needed  Activities of Daily Living      Permission Sought/Granted   Permission granted to share information with : No              Emotional Assessment Appearance:: Appears stated age Attitude/Demeanor/Rapport: Engaged Affect (typically observed): Accepting Orientation: : Oriented to Self, Oriented to Place, Oriented to  Time, Oriented to Situation      Admission diagnosis:  Bilateral cellulitis of lower leg [L03.116,  L03.115] Acute on chronic respiratory failure with hypoxia and hypercapnia (HCC) [E33.29, J96.22] Patient Active Problem List   Diagnosis Date Noted   Severe sepsis without septic shock ([place])    Bacteremia    Acute on chronic respiratory failure with hypoxia and hypercapnia (HCC) 03/25/2022   Elevated troponin 03/25/2022   Cellulitis 03/25/2022   Acute encephalopathy 03/25/2022   COPD with acute exacerbation ([place]) 03/13/2022   Decubitus ulcer of left buttock 03/13/2022   Stage 3a chronic kidney disease (CKD) ([place]) 03/12/2022   Mixed hyperlipidemia 03/12/2022   Nicotine dependence, cigarettes, uncomplicated 51/88/4166   Acute on chronic systolic CHF (congestive heart failure) ([place]) 11/13/2021   Atrial flutter, paroxysmal ([place]) 11/13/2021   Elevated LFTs 08/10/2021   HFrEF (heart failure with reduced ejection fraction) ([place])    Cocaine abuse ([place]) 07/18/2021   Bipolar disorder ([place]) 07/18/2021   Essential hypertension 07/18/2021   PCP:  Merryl Hacker, No Pharmacy:   [facility] [place] [facility] [address] [place] [address] Phone: [phone] Fax: [phone]  [id number] [place] [address] [place] [address] Phone: [phone] Fax: [phone]  [facility] [address] [place] [address] Phone: [phone] Fax: [phone]     Social Determinants of Health (SDOH) Interventions    Readmission Risk Interventions  03/17/2022    2:55 PM  Readmission Risk Prevention Plan  HRI or Home Care Consult Complete  Social Work Consult for [place] Planning/Counseling Complete  Palliative Care Screening Not Applicable  Medication Review Press photographer) Referral to Pharmacy

## 2022-03-28 NOTE — Progress Notes (Addendum)
I have seen and examined the patient. I have personally reviewed the clinical findings, laboratory findings, microbiological data and imaging studies. The assessment and treatment plan was discussed with the Nurse Practitioner Janene Madeira. I agree with her/his recommendations except following additions/corrections.  Overnight with hypotensive episode that responded to IVF and midodrine. Mental status unchanged Remained afebrile WBC stable at 7 WOC for lower extremity wounds   Blood cx 10/7 Acinetobacter baumannii/Aerococcus viridans  Blood cx 10/9 NG in 1 day   Agree with plan as below. Final recommendations pending sensitivities  Follow CBC  Rosiland Oz, MD Infectious Disease Physician [facility] for Infectious Disease [address] [place], [place] [address] Phone: [phone]  Fax: [facility] Infectious Disease  Date of Admission:  03/25/2022      Total days of antibiotics 3   Cefazolin 10/08 >> current          ASSESSMENT:  Valerie Rodgers is a 71 y.o. female admitted from community home/boardinghouse for evaluation of tremor and somnolence; found to have polymicrobial bacteremia. She does not report any significant localizing symptoms outside of the pain in her legs. Source seems to be non-purulent chronic wounds due to venous stasis, +/- arterial disease vs cocaine component as well. [facility] team recommended against wrapping for now given poorly assessed arterial system.   Bacteremic with acinetobacter baumannii (Sensitive) and group A streptococcus - will change to unasyn for preferred Acinetobacter tx. Will cover both. Not sure how much of a factor this is playing a role given she has not re-grown it on repeat BCx preliminarily 10/09, though I suspect it could be the reason for persistent leukocytosis as this has not adequately covered. Would trend CBC again daily the next few days.   Transaminitis +  intranasal drug use - hepatitis panel non-reactive, hep b non-reactive sAg. RPR non-reactive.   Discharge planning - discussed with OT at the bedside, agree SNF would be best for her level of care and safety. Continue with IV antibiotics for now pending further follow up. Suspect we can construct a PO regimen (2 orals needed).     PLAN: Change to Unasyn IV  Follow repeat blood cultures   Principal Problem:   Acute on chronic respiratory failure with hypoxia and hypercapnia (HCC) Active Problems:   HFrEF (heart failure with reduced ejection fraction) (HCC)   Elevated LFTs   Atrial flutter, paroxysmal (HCC)   Stage 3a chronic kidney disease (CKD) (HCC)   COPD with acute exacerbation (HCC)   Decubitus ulcer of left buttock   Elevated troponin   Cellulitis   Acute encephalopathy   Severe sepsis without septic shock (HCC)   Bacteremia    atorvastatin  20 mg Oral QHS   carvedilol  3.125 mg Oral BID WC   ivabradine  5 mg Oral BID WC   methylPREDNISolone (SOLU-MEDROL) injection  80 mg Intravenous Daily   midodrine  10 mg Oral TID WC   sodium chloride flush  3 mL Intravenous Q12H    SUBJECTIVE: No complaints today. Speaking about losing something but can't quite make out what she is referring to.    Review of Systems: Review of Systems  Constitutional:  Negative for chills and fever.  Eyes: Negative.   Respiratory: Negative.    Cardiovascular: Negative.   Genitourinary: Negative.   Musculoskeletal: Negative.   Skin:        Wounds to lower legs.    Past Medical  History:  Diagnosis Date   Arthritis    Bipolar affective disorder [facility])    Cataract    Past Surgical History:  Procedure Laterality Date   ABDOMINAL HYSTERECTOMY       No Known Allergies  OBJECTIVE: Vitals:   03/28/22 0330 03/28/22 0400 03/28/22 0700 03/28/22 0800  BP: (!) 89/73 (!) 84/56 91/62 97/72   Pulse: 62 62 78 62  Resp: 20 17 17    Temp: 98.5 F (36.9 C)     TempSrc: Oral     SpO2: 96% 98%  95%   Weight: 83.3 kg     Height:       Body mass index is 31.52 kg/m.  Physical Exam Vitals reviewed.  Constitutional:      Appearance: Normal appearance. She is not ill-appearing.     Comments: Resting in bed. Appears comfortable. Laughing and smiling.   HENT:     Mouth/Throat:     Mouth: Mucous membranes are moist.     Pharynx: Oropharynx is clear.     Comments: Poor dentition Cardiovascular:     Rate and Rhythm: Normal rate and regular rhythm.  Pulmonary:     Effort: Pulmonary effort is normal.     Breath sounds: Normal breath sounds.  Musculoskeletal:        General: Normal range of motion.  Skin:    Comments: Wounds wrapped in kerlix today. Bleeding through on right posterior aspect of the dressing.   Neurological:     Mental Status: She is alert.     Comments: Seems appropriately oriented.      Lab Results Lab Results  Component Value Date   WBC 15.0 (H) 03/27/2022   HGB 11.4 (L) 03/27/2022   HCT 36.8 03/27/2022   MCV 86.8 03/27/2022   PLT PLATELET CLUMPS NOTED ON SMEAR, UNABLE TO ESTIMATE 03/27/2022    Lab Results  Component Value Date   CREATININE 1.46 (H) 03/27/2022   BUN 33 (H) 03/27/2022   NA 146 (H) 03/27/2022   K 4.9 03/27/2022   CL 105 03/27/2022   CO2 25 03/27/2022    Lab Results  Component Value Date   ALT 44 03/27/2022   AST 49 (H) 03/27/2022   ALKPHOS 133 (H) 03/27/2022   BILITOT 0.4 03/27/2022     Microbiology: Recent Results (from the past 240 hour(s))  Blood culture (routine x 2)     Status: None (Preliminary result)   Collection Time: 03/25/22  5:33 PM   Specimen: BLOOD  Result Value Ref Range Status   Specimen Description BLOOD LEFT ANTECUBITAL  Final   Special Requests   Final    BOTTLES DRAWN AEROBIC AND ANAEROBIC Blood Culture results may not be optimal due to an inadequate volume of blood received in culture bottles   Culture   Final    NO GROWTH 2 DAYS Performed at [facility] [address], [place],  [place] [address]    Report Status PENDING  Incomplete  Blood culture (routine x 2)     Status: None (Preliminary result)   Collection Time: 03/25/22  5:38 PM   Specimen: BLOOD  Result Value Ref Range Status   Specimen Description BLOOD BLOOD LEFT HAND  Final   Special Requests   Final    BOTTLES DRAWN AEROBIC AND ANAEROBIC Blood Culture results may not be optimal due to an inadequate volume of blood received in culture bottles   Culture  Setup Time   Final    GRAM POSITIVE COCCI IN  CLUSTERS AEROBIC BOTTLE ONLY CRITICAL RESULT CALLED TO, READ BACK BY AND VERIFIED WITH: PHARMD C.PIERCE AT 1223 ON 03/26/2022 BY T.SAAD. GRAM NEGATIVE RODS CRITICAL RESULT CALLED TO, READ BACK BY AND VERIFIED WITH: J. FRENS PHARMD, AT XT:[id number] 03/27/22 D. VANHOOK    Culture GRAM POSITIVE COCCI ACINETOBACTER SPECIES   Final   Report Status PENDING  Incomplete   Organism ID, Bacteria ACINETOBACTER SPECIES  Final      Susceptibility   Acinetobacter species - MIC*    CEFTAZIDIME 4 SENSITIVE Sensitive     CIPROFLOXACIN <=0.25 SENSITIVE Sensitive     GENTAMICIN <=1 SENSITIVE Sensitive     IMIPENEM <=0.25 SENSITIVE Sensitive     PIP/TAZO <=4 SENSITIVE Sensitive     TRIMETH/SULFA <=20 SENSITIVE Sensitive     AMPICILLIN/SULBACTAM <=2 SENSITIVE Sensitive     LEVOFLOXACIN Value in next row Sensitive      SENSITIVE<=0.12Performed at [facility], [place] [address] [place], [place] [address]    * ACINETOBACTER SPECIES  Blood Culture ID Panel (Reflexed)     Status: Abnormal   Collection Time: 03/25/22  5:38 PM  Result Value Ref Range Status   Enterococcus faecalis NOT DETECTED NOT DETECTED Final   Enterococcus Faecium NOT DETECTED NOT DETECTED Final   Listeria monocytogenes NOT DETECTED NOT DETECTED Final   Staphylococcus species NOT DETECTED NOT DETECTED Final   Staphylococcus aureus (BCID) NOT DETECTED NOT DETECTED Final   Staphylococcus epidermidis NOT DETECTED NOT DETECTED Final   Staphylococcus lugdunensis NOT  DETECTED NOT DETECTED Final   Streptococcus species DETECTED (A) NOT DETECTED Final    Comment: CRITICAL RESULT CALLED TO, READ BACK BY AND VERIFIED WITH: PHARMD C.PIERCE AT 1223 ON 03/26/2022 BY T.SAAD.    Streptococcus agalactiae NOT DETECTED NOT DETECTED Final   Streptococcus pneumoniae NOT DETECTED NOT DETECTED Final   Streptococcus pyogenes DETECTED (A) NOT DETECTED Final    Comment: CRITICAL RESULT CALLED TO, READ BACK BY AND VERIFIED WITH: PHARMD C.PIERCE AT 1223 ON 03/26/2022 BY T.SAAD.    A.calcoaceticus-baumannii NOT DETECTED NOT DETECTED Final   Bacteroides fragilis NOT DETECTED NOT DETECTED Final   Enterobacterales NOT DETECTED NOT DETECTED Final   Enterobacter cloacae complex NOT DETECTED NOT DETECTED Final   Escherichia coli NOT DETECTED NOT DETECTED Final   Klebsiella aerogenes NOT DETECTED NOT DETECTED Final   Klebsiella oxytoca NOT DETECTED NOT DETECTED Final   Klebsiella pneumoniae NOT DETECTED NOT DETECTED Final   Proteus species NOT DETECTED NOT DETECTED Final   Salmonella species NOT DETECTED NOT DETECTED Final   Serratia marcescens NOT DETECTED NOT DETECTED Final   Haemophilus influenzae NOT DETECTED NOT DETECTED Final   Neisseria meningitidis NOT DETECTED NOT DETECTED Final   Pseudomonas aeruginosa NOT DETECTED NOT DETECTED Final   Stenotrophomonas maltophilia NOT DETECTED NOT DETECTED Final   Candida albicans NOT DETECTED NOT DETECTED Final   Candida auris NOT DETECTED NOT DETECTED Final   Candida glabrata NOT DETECTED NOT DETECTED Final   Candida krusei NOT DETECTED NOT DETECTED Final   Candida parapsilosis NOT DETECTED NOT DETECTED Final   Candida tropicalis NOT DETECTED NOT DETECTED Final   Cryptococcus neoformans/gattii NOT DETECTED NOT DETECTED Final    Comment: Performed at [facility], [place] [address], [place], [place] [address]  SARS Coronavirus 2 by RT PCR (hospital order, performed in [facility] hospital lab) *cepheid single result test*  Anterior Nasal Swab     Status: None   Collection Time: 03/25/22  5:42 PM   Specimen: Anterior  Nasal Swab  Result Value Ref Range Status   SARS Coronavirus 2 by RT PCR NEGATIVE NEGATIVE Final    Comment: (NOTE) SARS-CoV-2 target nucleic acids are NOT DETECTED.  The SARS-CoV-2 RNA is generally detectable in upper and lower respiratory specimens during the acute phase of infection. The lowest concentration of SARS-CoV-2 viral copies this assay can detect is 250 copies / mL. A negative result does not preclude SARS-CoV-2 infection and should not be used as the sole basis for treatment or other patient management decisions.  A negative result may occur with improper specimen collection / handling, submission of specimen other than nasopharyngeal swab, presence of viral mutation(s) within the areas targeted by this assay, and inadequate number of viral copies (<250 copies / mL). A negative result must be combined with clinical observations, patient history, and epidemiological information.  Fact Sheet for Patients:   https://www.patel.info/  Fact Sheet for Healthcare Providers: https://hall.com/  This test is not yet approved or  cleared by the [place] FDA and has been authorized for detection and/or diagnosis of SARS-CoV-2 by FDA under an Emergency Use Authorization (EUA).  This EUA will remain in effect (meaning this test can be used) for the duration of the COVID-19 declaration under Section 564(b)(1) of the Act, 21 U.S.C. section 360bbb-3(b)(1), unless the authorization is terminated or revoked sooner.  Performed at [facility], [place] [address] [place], [place] [address]   MRSA Next Gen by PCR, Nasal     Status: None   Collection Time: 03/26/22  2:05 AM   Specimen: Nasal Mucosa; Nasal Swab  Result Value Ref Range Status   MRSA by PCR Next Gen NOT DETECTED NOT DETECTED Final    Comment: (NOTE) The GeneXpert MRSA Assay  (FDA approved for NASAL specimens only), is one component of a comprehensive MRSA colonization surveillance program. It is not intended to diagnose MRSA infection nor to guide or monitor treatment for MRSA infections. Test performance is not FDA approved in patients less than 15 years old. Performed at [facility], [place] [address] [place], [place] [address]   Culture, blood (Routine X 2) w Reflex to ID Panel     Status: None (Preliminary result)   Collection Time: 03/27/22  7:34 AM   Specimen: BLOOD RIGHT FOREARM  Result Value Ref Range Status   Specimen Description BLOOD RIGHT FOREARM  Final   Special Requests   Final    BOTTLES DRAWN AEROBIC AND ANAEROBIC Blood Culture adequate volume   Culture   Final    NO GROWTH < 12 HOURS Performed at [facility], [place] [address] [place], [place] [address]    Report Status PENDING  Incomplete  Culture, blood (Routine X 2) w Reflex to ID Panel     Status: None (Preliminary result)   Collection Time: 03/27/22  7:37 AM   Specimen: BLOOD  Result Value Ref Range Status   Specimen Description BLOOD LEFT ANTECUBITAL  Final   Special Requests   Final    BOTTLES DRAWN AEROBIC AND ANAEROBIC Blood Culture results may not be optimal due to an inadequate volume of blood received in culture bottles   Culture   Final    NO GROWTH < 12 HOURS Performed at [facility], [place] [address] [place], [place] [address]    Report Status PENDING  Incomplete   Imaging ECHOCARDIOGRAM COMPLETE  Result Date: 03/26/2022    ECHOCARDIOGRAM REPORT   Patient Name:   JADLYN CREAGH  Date of Exam: 03/26/2022 Medical Rec #:  [id number]       Height:       64.0 in Accession #:    [id number]      Weight:       178.4 lb Date of Birth:  [birth year]        BSA:          1.863 m Patient Age:    29 years        BP:           102/74 mmHg Patient Gender: F               HR:           79 bpm. Exam Location:  Inpatient Procedure: 2D Echo, 3D Echo, Color Doppler, Cardiac  Doppler and Intracardiac            Opacification Agent Indications:     Elevated Troponin  History:         Patient has prior history of Echocardiogram examinations, most                  recent 11/15/2021. COPD; Arrythmias:Atrial Fibrillation.  Sonographer:     Bernadene Person RDCS Referring Phys:  [id number] Ilene Qua OPYD Diagnosing Phys: Adrian Prows MD IMPRESSIONS  1. 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 Left ventricular ejection fraction, by estimation, is 20 to 25%. Left ventricular ejection fraction by 3D volume is 29 %. The left ventricle has severely decreased function. The left ventricle demonstrates global hypokinesis. Left ventricular diastolic function could not be evaluated. There is the interventricular septum is flattened in systole and diastole, consistent with right ventricular pressure and volume overload.  2. Right ventricular systolic function is normal. The right ventricular size is normal. There is moderately elevated pulmonary artery systolic pressure.  3. Left atrial size was moderately dilated.  4. Right atrial size was moderately dilated.  5. The mitral valve is normal in structure. Moderate mitral valve regurgitation.  6. Tricuspid valve regurgitation is moderate.  7. The aortic valve is tricuspid. Aortic valve regurgitation is trivial. No aortic stenosis is present.  8. Pulmonic valve regurgitation is moderate. Comparison(s): Compared to 05/302023, EF marginally better from 20% to 25%. 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 Left ventricular ejection fraction, by estimation, is 20 to 25%. Left ventricular ejection fraction by 3D volume is 29 %. The left ventricle has severely decreased function. The left ventricle demonstrates global hypokinesis. Definity contrast agent was given IV to delineate the left ventricular endocardial borders. The left ventricular internal cavity size was normal in size. There is no left ventricular  hypertrophy. The interventricular septum is flattened in systole and diastole, consistent with right ventricular pressure and volume overload. Left ventricular diastolic function could not be evaluated. Right Ventricle: The right ventricular size is normal. No increase in right ventricular wall thickness. Right ventricular systolic function is normal. There is moderately elevated pulmonary artery systolic pressure. The tricuspid regurgitant velocity is 2.69 m/s, and with an assumed right atrial pressure of 15 mmHg, the estimated right ventricular systolic pressure is 26.9 mmHg. Left Atrium: Left atrial size was moderately dilated. Right Atrium: Right atrial size was moderately dilated. Pericardium: There is no evidence of pericardial effusion. Mitral Valve: The mitral valve is normal in structure. Moderate mitral valve regurgitation. Tricuspid Valve: The tricuspid valve is normal in structure. Tricuspid valve regurgitation is moderate. Aortic Valve: The aortic valve is tricuspid. Aortic valve regurgitation is trivial. No aortic  stenosis is present. Pulmonic Valve: The pulmonic valve was grossly normal. Pulmonic valve regurgitation is moderate. Aorta: The aortic root is normal in size and structure. IAS/Shunts: No atrial level shunt detected by color flow Doppler.  LEFT VENTRICLE PLAX 2D LVIDd:         6.10 cm LVIDs:         4.70 cm LV PW:         0.90 cm         3D Volume EF LV IVS:        0.80 cm         LV 3D EF:    Left LVOT diam:     2.10 cm                      ventricul LV SV:         84                           ar LV SV Index:   45                           ejection LVOT Area:     3.46 cm                     fraction                                             by 3D                                             volume is LV Volumes (MOD)                            29 %. LV vol d, MOD    201.0 ml A2C: LV vol d, MOD    152.0 ml      3D Volume EF: A4C:                           3D EF:        29 % LV vol s, MOD     133.0 ml      LV EDV:       220 ml A2C:                           LV ESV:       155 ml LV vol s, MOD    108.0 ml      LV SV:        65 ml A4C: LV SV MOD A2C:   68.0 ml LV SV MOD A4C:   152.0 ml LV SV MOD BP:    59.2 ml RIGHT VENTRICLE TAPSE (M-mode): 1.3 cm LEFT ATRIUM             Index        RIGHT ATRIUM           Index LA diam:        4.20 cm 2.25 cm/m  RA Area:     23.70 cm LA Vol (A2C):   86.8 ml 46.58 ml/m  RA Volume:   81.90 ml  43.95 ml/m LA Vol (A4C):   75.3 ml 40.41 ml/m LA Biplane Vol: 81.4 ml 43.68 ml/m  AORTIC VALVE             PULMONIC VALVE LVOT Vmax:   136.33 cm/s PR End Diast Vel: 8.29 msec LVOT Vmean:  91.133 cm/s LVOT VTI:    0.243 m  AORTA Ao Root diam: 3.10 cm Ao Asc diam:  3.60 cm TRICUSPID VALVE TR Peak grad:   28.9 mmHg TR Vmax:        269.00 cm/s  SHUNTS Systemic VTI:  0.24 m Systemic Diam: 2.10 cm Adrian Prows MD Electronically signed by Adrian Prows MD Signature Date/Time: 03/26/2022/8:41:38 PM    Final    US Abdomen Limited RUQ (LIVER/GB)  Result Date: 03/25/2022 CLINICAL DATA:  Elevated liver function test. EXAM: ULTRASOUND ABDOMEN LIMITED RIGHT UPPER QUADRANT COMPARISON:  None Available. FINDINGS: Gallbladder: No gallstones are visualized. The gallbladder wall measures 3.62 mm in thickness. A moderate amount of pericholecystic fluid is seen. No sonographic Murphy sign noted by sonographer. Common bile duct: Diameter: 1.63 mm Liver: No focal lesion identified. Within normal limits in parenchymal echogenicity. Portal vein is patent on color Doppler imaging with normal direction of blood flow towards the liver. Other: A trace amount of free fluid is seen along the inferior aspect of the right lobe of the liver. IMPRESSION: 1. Gallbladder wall thickening and pericholecystic fluid without evidence of cholelithiasis or acute cholecystitis. 2. Trace amount of perihepatic free fluid. Electronically Signed   By: Virgina Norfolk M.D.   On: 03/25/2022 22:10   DG Chest Port 1  View  Result Date: 03/25/2022 CLINICAL DATA:  Tremors. EXAM: PORTABLE CHEST 1 VIEW COMPARISON:  March 12, 2022 FINDINGS: Stable moderate to marked severity cardiac silhouette enlargement is noted. Stable, mild to moderate severity diffusely increased interstitial lung markings are seen. Mild atelectasis and/or infiltrate is suspected along the infrahilar region on the left. There is no evidence of a pleural effusion or pneumothorax. The visualized skeletal structures are unremarkable. IMPRESSION: 1. Stable moderate to marked severity cardiac silhouette enlargement. 2. Chronic interstitial lung disease with mild left infrahilar atelectasis and/or infiltrate. Electronically Signed   By: Virgina Norfolk M.D.   On: 03/25/2022 18:54   DG Chest Port 1 View  Result Date: 03/12/2022 CLINICAL DATA:  Leg swelling EXAM: PORTABLE CHEST 1 VIEW COMPARISON:  11/13/2021 FINDINGS: Cardiomegaly with vascular congestion and bilateral airspace disease compatible with edema/CHF. No visible effusions or acute bony abnormality. IMPRESSION: Mild to moderate CHF Electronically Signed   By: Rolm Baptise M.D.   On: 03/12/2022 19:24     Janene Madeira, MSN, NP-C [facility] Infectious Disease [email or web address] Pager: [phone] Office: [phone] [facility] Main Line: [place] Communication Welcome

## 2022-03-28 NOTE — Progress Notes (Signed)
Patient had 17 beats of VT while sleeping, she is asymptomatic and awakes briefly to name calling. Dr. Aileen Fass notified and order received for an EKG to check her QTC. EKG done and placed in patient's chart.

## 2022-03-28 NOTE — Progress Notes (Signed)
Mobility Specialist Progress Note    03/28/22 1413  Mobility  Activity Ambulated with assistance in room  Level of Assistance Moderate assist, patient does 50-74%  Assistive Device Front wheel walker  Distance Ambulated (ft) 15 ft  Activity Response Tolerated fair  Mobility Referral Yes  $Mobility charge 1 Mobility   Pre-Mobility: 69 HR, 94% SpO2  Pt received in bed and agreeable. C/o 10/10 leg pain. Left in chair with call bell in reach. RN aware.   Valerie Rodgers Mobility Specialist

## 2022-03-28 NOTE — Evaluation (Signed)
Occupational Therapy Evaluation Patient Details Name: Valerie Rodgers MRN: [id number] DOB: 1950/09/07 Today's Date: 03/28/2022   History of Present Illness Pt is 71 yo female admitted 03/25/22 with tremor and somnolence, with COPD exacerbation. PMH to include Afib, COPD on supplemental O2, leg wounds, CHF, substance abuse, bipolar, CKD, and cirrhosis.   Clinical Impression   Valerie Rodgers was evaluated s/p the above admission list, per her report she is typically indep at baseline with use of SPC and friends to assist with IADLs. Pt presents with impaired cognition and is an unreliable historian. Upon evaluation pt needed max cues for encouragement, attention, problem solving and safety. Overall she needed min A for sitting ADLs and max A for LB ADLs. She stood at EOB with min A and RW and took lateral steps, pt declined further mobility or transfer to recliner. OT to continue to follow. Recommend d/c to SNF for continued therapy.     Recommendations for follow up therapy are one component of a multi-disciplinary discharge planning process, led by the attending physician.  Recommendations may be updated based on patient status, additional functional criteria and insurance authorization.   Follow Up Recommendations  Skilled nursing-short term rehab (<3 hours/day)    Assistance Recommended at Discharge Frequent or constant Supervision/Assistance  Patient can return home with the following A lot of help with walking and/or transfers;A lot of help with bathing/dressing/bathroom;Direct supervision/assist for medications management;Direct supervision/assist for financial management;Assist for transportation;Help with stairs or ramp for entrance    Functional Status Assessment  Patient has had a recent decline in their functional status and demonstrates the ability to make significant improvements in function in a reasonable and predictable amount of time.  Equipment Recommendations  None recommended by OT     Recommendations for Other Services       Precautions / Restrictions Precautions Precautions: Fall Precaution Comments: watch SpO2, baseline 2-4L Restrictions Weight Bearing Restrictions: No      Mobility Bed Mobility Overal bed mobility: Needs Assistance Bed Mobility: Supine to Sit     Supine to sit: HOB elevated, Min assist Sit to supine: Mod assist   General bed mobility comments: assist for LE and trunk elevation    Transfers Overall transfer level: Needs assistance Equipment used: Rolling walker (2 wheels) Transfers: Sit to/from Stand, Bed to chair/wheelchair/BSC Sit to Stand: Min assist           General transfer comment: min A and lateral stepping and the bed side with min A overall. requires constant cues for attention. Pt declining further mobility      Balance Overall balance assessment: Needs assistance Sitting-balance support: Single extremity supported, Feet supported Sitting balance-Leahy Scale: Fair Sitting balance - Comments: impulsive wtih lateral leaning at the bed side   Standing balance support: During functional activity, Reliant on assistive device for balance, Bilateral upper extremity supported Standing balance-Leahy Scale: Poor                             ADL either performed or assessed with clinical judgement   ADL Overall ADL's : Needs assistance/impaired Eating/Feeding: Independent;Sitting   Grooming: Set up;Sitting   Upper Body Bathing: Minimal assistance;Sitting   Lower Body Bathing: Sit to/from stand;Maximal assistance   Upper Body Dressing : Minimal assistance;Sitting   Lower Body Dressing: Maximal assistance;Sit to/from stand   Toilet Transfer: Moderate assistance;Ambulation;Rolling walker (2 wheels)   Toileting- Clothing Manipulation and Hygiene: Maximal assistance;Sit to/from stand  Functional mobility during ADLs: Maximal assistance General ADL Comments: impaired cognition, BLE pain, 2L [place]      Vision Baseline Vision/History: 2 Legally blind;1 Wears glasses Vision Assessment?: No apparent visual deficits            Pertinent Vitals/Pain Pain Assessment Pain Assessment: Faces Pain Location: wounds on BLEs Pain Descriptors / Indicators: Constant, Grimacing Pain Intervention(s): Limited activity within patient's tolerance, Monitored during session     Hand Dominance Right   Extremity/Trunk Assessment Upper Extremity Assessment Upper Extremity Assessment: Generalized weakness   Lower Extremity Assessment Lower Extremity Assessment: Defer to PT evaluation   Cervical / Trunk Assessment Cervical / Trunk Assessment: Kyphotic   Communication Communication Communication: Expressive difficulties (difficult to understand)   Cognition Arousal/Alertness: Awake/alert Behavior During Therapy: Impulsive Overall Cognitive Status: No family/caregiver present to determine baseline cognitive functioning Area of Impairment: Attention, Memory, Following commands, Safety/judgement, Awareness, Problem solving                   Current Attention Level: Sustained Memory: Decreased short-term memory Following Commands: Follows one step commands inconsistently, Follows one step commands with increased time Safety/Judgement: Decreased awareness of safety, Decreased awareness of deficits Awareness: Intellectual Problem Solving: Slow processing, Requires verbal cues, Requires tactile cues, Difficulty sequencing, Decreased initiation General Comments: poor health literacy, decreased insight to deficits. easily distracted and self distracting. Needs constant cues for attention and re-direction     General Comments  impaired cognition and difficult to understand. Pt reports she is goign home tomorrow despite medical complications, pain and difficulty walking. SpO2 89-94% on 2L     Home Living Family/patient expects to be discharged to:: Group home             Additional  Comments: Pt verbalies everything she needs is on main floor, lives at boarding home with verbal report of assistance as needed from others in the home. 1 step to enter no hand rails.      Prior Functioning/Environment Prior Level of Function : Patient poor historian/Family not available;Needs assist       Physical Assist : Mobility (physical);ADLs (physical) Mobility (physical): Gait ADLs (physical): IADLs Mobility Comments: Pt states inconsistent cane usage, however pt is unreliable narrator. walks to get groceries or a friend drives her ADLs Comments: Pt states independent in self care, needs assistance for IADLs        OT Problem List: Decreased range of motion;Decreased strength;Decreased activity tolerance;Decreased cognition;Decreased safety awareness      OT Treatment/Interventions: Self-care/ADL training;Therapeutic exercise;DME and/or AE instruction;Therapeutic activities;Patient/family education;Balance training    OT Goals(Current goals can be found in the care plan section) Acute Rehab OT Goals Patient Stated Goal: to go home tomorrow OT Goal Formulation: With patient Time For Goal Achievement: 04/01/22 Potential to Achieve Goals: Good ADL Goals Pt Will Perform Upper Body Dressing: with min guard assist;standing Pt Will Perform Lower Body Dressing: with min assist;sit to/from stand Pt Will Transfer to Toilet: with min guard assist;ambulating Additional ADL Goal #1: Pt will indep follow 2 step commands to complete functional task  OT Frequency: Min 2X/week       AM-PAC OT "6 Clicks" Daily Activity     Outcome Measure Help from another person eating meals?: None Help from another person taking care of personal grooming?: A Little Help from another person toileting, which includes using toliet, bedpan, or urinal?: A Little Help from another person bathing (including washing, rinsing, drying)?: A Lot Help from another person to put on and  taking off regular upper  body clothing?: A Little Help from another person to put on and taking off regular lower body clothing?: A Lot 6 Click Score: 17   End of Session Equipment Utilized During Treatment: Gait belt;Rolling walker (2 wheels);Oxygen Nurse Communication: Mobility status  Activity Tolerance: Patient tolerated treatment well Patient left: in bed;with call bell/phone within reach;with bed alarm set  OT Visit Diagnosis: Unsteadiness on feet (R26.81);Other abnormalities of gait and mobility (R26.89);Muscle weakness (generalized) (M62.81)                Time: [id number] OT Time Calculation (min): 26 min Charges:  OT General Charges $OT Visit: 1 Visit OT Evaluation $OT Eval Moderate Complexity: 1 Mod OT Treatments $Self Care/Home Management : 8-22 mins   Elliot Cousin 03/28/2022, 10:28 AM

## 2022-03-28 NOTE — [place] FL2 (Signed)
[place] MEDICAID FL2 LEVEL OF CARE SCREENING TOOL     IDENTIFICATION  Patient Name: Valerie Rodgers Birthdate: 03/05/1951 Sex: female Admission Date (Current Location): 03/25/2022  [facility] and [place] Number:  Herbalist and Address:  [facility], [place] [address] [place], [place] [address]      Provider Number: [id number]  Attending Physician Name and Address:  Geradine Girt, DO  Relative Name and Phone Number:  [name] Denman George)   [phone] [facility] Phone)    Current Level of Care: Hospital Recommended Level of Care: [place] Prior Approval Number:    Date Approved/Denied:   PASRR Number: Pending  Discharge Plan: SNF    Current Diagnoses: Patient Active Problem List   Diagnosis Date Noted   Severe sepsis without septic shock ([place])    Bacteremia    Acute on chronic respiratory failure with hypoxia and hypercapnia ([place]) 03/25/2022   Elevated troponin 03/25/2022   Cellulitis 03/25/2022   Acute encephalopathy 03/25/2022   COPD with acute exacerbation ([place]) 03/13/2022   Decubitus ulcer of left buttock 03/13/2022   Stage 3a chronic kidney disease (CKD) ([place]) 03/12/2022   Mixed hyperlipidemia 03/12/2022   Nicotine dependence, cigarettes, uncomplicated 38/75/6433   Acute on chronic systolic CHF (congestive heart failure) ([place]) 11/13/2021   Atrial flutter, paroxysmal ([place]) 11/13/2021   Elevated LFTs 08/10/2021   HFrEF (heart failure with reduced ejection fraction) ([place])    Cocaine abuse ([place]) 07/18/2021   Bipolar disorder ([place]) 07/18/2021   Essential hypertension 07/18/2021    Orientation RESPIRATION BLADDER Height & Weight     Self, Time, Situation, Place  O2 (4LNC) Incontinent Weight: 183 lb 10.3 oz (83.3 kg) Height:  5\' 4"  (162.6 cm)  BEHAVIORAL SYMPTOMS/MOOD NEUROLOGICAL BOWEL NUTRITION STATUS      Continent Diet (see d/c summary)  AMBULATORY STATUS COMMUNICATION OF NEEDS Skin   Extensive Assist Verbally  PU Stage and Appropriate Care, Other (Comment) (pressure injury, buttocks, left, stage 2; non pressure wound pretibial left; non pressure wound, pretibial right; non pressure wound tibial right, posterior)                       Personal Care Assistance Level of Assistance  Bathing, Feeding, Dressing Bathing Assistance: Limited assistance Feeding assistance: Independent Dressing Assistance: Limited assistance     Functional Limitations Info  Sight, Hearing, Speech Sight Info: Adequate Hearing Info: Adequate Speech Info: Adequate    SPECIAL CARE FACTORS FREQUENCY  OT (By licensed OT), PT (By licensed PT)     PT Frequency: 5x/week OT Frequency: 5x/week            Contractures Contractures Info: Not present    Additional Factors Info  Code Status, Allergies Code Status Info: DNR Allergies Info: no known allergies           Current Medications (03/28/2022):  This is the current hospital active medication list Current Facility-Administered Medications  Medication Dose Route Frequency Provider Last Rate Last Admin   acetaminophen (TYLENOL) tablet 650 mg  650 mg Oral Q6H PRN Opyd, Ilene Qua, MD       Or   acetaminophen (TYLENOL) suppository 650 mg  650 mg Rectal Q6H PRN Opyd, Ilene Qua, MD       albuterol (PROVENTIL) (2.5 MG/3ML) 0.083% nebulizer solution 2.5 mg  2.5 mg Nebulization Q4H PRN Opyd, Ilene Qua, MD       Ampicillin-Sulbactam (UNASYN) 3 g in sodium chloride 0.9 % 100 mL IVPB  3 g Intravenous Q6H Rosiland Oz, MD 200 mL/hr at 03/28/22 1035 3 g at 03/28/22 1035   atorvastatin (LIPITOR) tablet 20 mg  20 mg Oral QHS Opyd, Ilene Qua, MD   20 mg at 03/27/22 2126   carvedilol (COREG) tablet 3.125 mg  3.125 mg Oral BID WC Opyd, Ilene Qua, MD   3.125 mg at 03/27/22 1705   dicyclomine (BENTYL) tablet 20 mg  20 mg Oral Q6H PRN Eulogio Bear U, DO       fentaNYL (SUBLIMAZE) injection 12.5 mcg  12.5 mcg Intravenous Q2H PRN Eulogio Bear U, DO   12.5 mcg at 03/27/22  0942   hydrOXYzine (ATARAX) tablet 25 mg  25 mg Oral Q6H PRN Eulogio Bear U, DO       ivabradine (CORLANOR) tablet 5 mg  5 mg Oral BID WC Opyd, Ilene Qua, MD   5 mg at 03/28/22 [id number]   loperamide (IMODIUM) capsule 2-4 mg  2-4 mg Oral PRN Geradine Girt, DO       methocarbamol (ROBAXIN) tablet 500 mg  500 mg Oral Q8H PRN Vann, Jessica U, DO       methylPREDNISolone sodium succinate (SOLU-MEDROL) 125 mg/2 mL injection 80 mg  80 mg Intravenous Daily Vann, Jessica U, DO   80 mg at 03/28/22 0905   midodrine (PROAMATINE) tablet 10 mg  10 mg Oral TID WC Irene Pap N, DO   10 mg at 03/28/22 [id number]   naproxen (NAPROSYN) tablet 500 mg  500 mg Oral BID PRN Eulogio Bear U, DO       ondansetron (ZOFRAN) injection 4 mg  4 mg Intravenous Q6H PRN Opyd, Ilene Qua, MD       senna-docusate (Senokot-S) tablet 1 tablet  1 tablet Oral QHS PRN Opyd, Ilene Qua, MD   1 tablet at 03/26/22 1639   sodium chloride flush (NS) 0.9 % injection 3 mL  3 mL Intravenous Q12H Opyd, Ilene Qua, MD   3 mL at 03/28/22 1025     Discharge Medications: Please see discharge summary for a list of discharge medications.  Relevant Imaging Results:  Relevant Lab Results:   Additional Information SSN Ardoch Delano, [place]

## 2022-03-28 NOTE — Plan of Care (Signed)

## 2022-03-28 NOTE — Progress Notes (Signed)
PROGRESS NOTE    Valerie Rodgers  [id number] DOB: [birth year] DOA: 03/25/2022 PCP: Pcp, No    Brief Narrative:   Valerie Rodgers is a pleasant 71 y.o. female with medical history significant for COPD, chronic hypoxic respiratory failure, chronic combined systolic and diastolic CHF, substance abuse, chronic wounds, and atrial fibrillation not on anticoagulation who presents with tremor and somnolence. Found to have bacteremia, cellulitis.    Assessment and Plan: COPD exacerbation; acute on chronic hypoxic and hypercarbic respiratory failure  - Presents with somnolence and tremor and found to be hypoxic and hypercarbic with tachypnea, labored breathing, and wheezes  - Check sputum culture, start systemic steroids and antibiotic, schedule duonebs and use additional albuterol as needed, use BiPAP PRN   -wean O2 to off if able   Group a strep bacteremia from cellulitis -ID consult IV abx -source: skin -WOC    Elevated LFTs  - She has mild elevation in transaminases and bilirubin  - viral hepatitis panel negative   Chronic combined systolic & diastolic CHF  - EF was [id number] with grade 3 diastolic dysfunction in May 2023 -- slightly improved this AM - She appears to be compensated, wt is down >5 kg from time of recent admission for acute CHF  -  Coreg, monitor wt and I/Os -follows with palliative care outpatient      CKD IIIa  - SCR is 1.16 on admission, appears close to baseline  - Renally-dose medications, monitor    Elevated troponin  - Troponin is elevated 272 in ED without chest pain  - Continue cardiac monitoring, trend troponin, continue beta-blocker, check echocardiogram    -encourage cocaine cessation   Acute encephalopathy  -appears resolved   PAF  - Not anticoagulated, continue Coreg   Obesity Estimated body mass index is 31.52 kg/m as calculated from the following:   Height as of this encounter: 5\' 4"  (1.626 m).   Weight as of this encounter: 83.3 kg.   DVT  prophylaxis: SCDs Start: 03/25/22 2109    Code Status: DNR   Disposition Plan:  Level of care: Progressive Status is: Inpatient Remains inpatient appropriate because: needs SNF    Consultants:  ID   Subjective: No SOB, no CP Agreeable for rehab  Objective: Vitals:   03/28/22 0330 03/28/22 0400 03/28/22 0700 03/28/22 0800  BP: (!) 89/73 (!) 84/56 91/62 97/72   Pulse: 62 62 78 62  Resp: 20 17 17    Temp: 98.5 F (36.9 C)     TempSrc: Oral     SpO2: 96% 98% 95%   Weight: 83.3 kg     Height:        Intake/Output Summary (Last 24 hours) at 03/28/2022 1120 Last data filed at 03/28/2022 0640 Gross per 24 hour  Intake 660 ml  Output 950 ml  Net -290 ml   Filed Weights   03/25/22 1528 03/26/22 0519 03/28/22 0330  Weight: 79 kg 80.9 kg 83.3 kg    Examination:    General: Appearance:    Obese female in no acute distress, poor dentition     Lungs:      respirations unlabored  Heart:    Normal heart rate. Normal rhythm. No murmurs, rubs, or gallops.   MS:   All extremities are intact.   Neurologic:   Awake, alert, speech difficult to understand at times         Data Reviewed: I have personally reviewed following labs and imaging studies  CBC: Recent Labs  Lab 03/25/22  1733 03/25/22 1824 03/25/22 1858 03/26/22 0548 03/27/22 0030  WBC 19.2*  --   --  15.2* 15.0*  NEUTROABS 17.0*  --   --   --   --   HGB 12.3 13.9 13.3 11.7* 11.4*  HCT 39.8 41.0 39.0 36.3 36.8  MCV 88.6  --   --  83.8 86.8  PLT PLATELET CLUMPS NOTED ON SMEAR, UNABLE TO ESTIMATE  --   --  163 PLATELET CLUMPS NOTED ON SMEAR, UNABLE TO ESTIMATE   Basic Metabolic Panel: Recent Labs  Lab 03/25/22 1733 03/25/22 1824 03/25/22 1858 03/26/22 0548 03/27/22 0030 03/28/22 0120  NA 144 144 142 144 146*  --   K 4.2 3.6 3.7 4.0 4.9  --   CL 104  --   --  105 105  --   CO2 28  --   --  32 25  --   GLUCOSE 84  --   --  180* 137*  --   BUN 24*  --   --  26* 33*  --   CREATININE 1.16*  --   --   1.21* 1.46*  --   CALCIUM 8.2*  --   --  8.1* 8.5*  --   MG 1.9  --   --  1.9  --  1.9  PHOS  --   --   --   --   --  2.8   GFR: Estimated Creatinine Clearance: 36.3 mL/min (A) (by C-G formula based on SCr of 1.46 mg/dL (H)). Liver Function Tests: Recent Labs  Lab 03/25/22 1733 03/26/22 0548 03/27/22 0030  AST 61* 44* 49*  ALT 47* 42 44  ALKPHOS 125 114 133*  BILITOT 1.5* 1.0 0.4  PROT 6.6 5.9* 5.8*  ALBUMIN 2.9* 2.7* 2.5*   No results for input(s): "LIPASE", "AMYLASE" in the last 168 hours. Recent Labs  Lab 03/26/22 0548  AMMONIA 48*   Coagulation Profile: No results for input(s): "INR", "PROTIME" in the last 168 hours. Cardiac Enzymes: No results for input(s): "CKTOTAL", "CKMB", "CKMBINDEX", "TROPONINI" in the last 168 hours. BNP (last 3 results) No results for input(s): "PROBNP" in the last 8760 hours. HbA1C: No results for input(s): "HGBA1C" in the last 72 hours. CBG: No results for input(s): "GLUCAP" in the last 168 hours. Lipid Profile: No results for input(s): "CHOL", "HDL", "LDLCALC", "TRIG", "CHOLHDL", "LDLDIRECT" in the last 72 hours. Thyroid Function Tests: No results for input(s): "TSH", "T4TOTAL", "FREET4", "T3FREE", "THYROIDAB" in the last 72 hours. Anemia Panel: No results for input(s): "VITAMINB12", "FOLATE", "FERRITIN", "TIBC", "IRON", "RETICCTPCT" in the last 72 hours. Sepsis Labs: Recent Labs  Lab 03/25/22 1741 03/25/22 2039 03/26/22 0548 03/28/22 0120  LATICACIDVEN 2.9* 2.0* 2.1* 2.1*    Recent Results (from the past 240 hour(s))  Blood culture (routine x 2)     Status: None (Preliminary result)   Collection Time: 03/25/22  5:33 PM   Specimen: BLOOD  Result Value Ref Range Status   Specimen Description BLOOD LEFT ANTECUBITAL  Final   Special Requests   Final    BOTTLES DRAWN AEROBIC AND ANAEROBIC Blood Culture results may not be optimal due to an inadequate volume of blood received in culture bottles   Culture   Final    NO GROWTH 2  DAYS Performed at [facility] [address] [place], [place] [address]    Report Status PENDING  Incomplete  Blood culture (routine x 2)     Status: None (Preliminary result)   Collection  Time: 03/25/22  5:38 PM   Specimen: BLOOD  Result Value Ref Range Status   Specimen Description BLOOD BLOOD LEFT HAND  Final   Special Requests   Final    BOTTLES DRAWN AEROBIC AND ANAEROBIC Blood Culture results may not be optimal due to an inadequate volume of blood received in culture bottles   Culture  Setup Time   Final    GRAM POSITIVE COCCI IN CLUSTERS AEROBIC BOTTLE ONLY CRITICAL RESULT CALLED TO, READ BACK BY AND VERIFIED WITH: PHARMD C.PIERCE AT 1223 ON 03/26/2022 BY T.SAAD. GRAM NEGATIVE RODS CRITICAL RESULT CALLED TO, READ BACK BY AND VERIFIED WITH: J. FRENS PHARMD, AT 4481 03/27/22 D. VANHOOK    Culture GRAM POSITIVE COCCI ACINETOBACTER SPECIES   Final   Report Status PENDING  Incomplete   Organism ID, Bacteria ACINETOBACTER SPECIES  Final      Susceptibility   Acinetobacter species - MIC*    CEFTAZIDIME 4 SENSITIVE Sensitive     CIPROFLOXACIN <=0.25 SENSITIVE Sensitive     GENTAMICIN <=1 SENSITIVE Sensitive     IMIPENEM <=0.25 SENSITIVE Sensitive     PIP/TAZO <=4 SENSITIVE Sensitive     TRIMETH/SULFA <=20 SENSITIVE Sensitive     AMPICILLIN/SULBACTAM <=2 SENSITIVE Sensitive     LEVOFLOXACIN Value in next row Sensitive      SENSITIVE<=0.12Performed at [facility], [address], [place], [place] [address]    * ACINETOBACTER SPECIES  Blood Culture ID Panel (Reflexed)     Status: Abnormal   Collection Time: 03/25/22  5:38 PM  Result Value Ref Range Status   Enterococcus faecalis NOT DETECTED NOT DETECTED Final   Enterococcus Faecium NOT DETECTED NOT DETECTED Final   Listeria monocytogenes NOT DETECTED NOT DETECTED Final   Staphylococcus species NOT DETECTED NOT DETECTED Final   Staphylococcus aureus (BCID) NOT DETECTED NOT DETECTED Final   Staphylococcus  epidermidis NOT DETECTED NOT DETECTED Final   Staphylococcus lugdunensis NOT DETECTED NOT DETECTED Final   Streptococcus species DETECTED (A) NOT DETECTED Final    Comment: CRITICAL RESULT CALLED TO, READ BACK BY AND VERIFIED WITH: PHARMD C.PIERCE AT 1223 ON 03/26/2022 BY T.SAAD.    Streptococcus agalactiae NOT DETECTED NOT DETECTED Final   Streptococcus pneumoniae NOT DETECTED NOT DETECTED Final   Streptococcus pyogenes DETECTED (A) NOT DETECTED Final    Comment: CRITICAL RESULT CALLED TO, READ BACK BY AND VERIFIED WITH: PHARMD C.PIERCE AT 1223 ON 03/26/2022 BY T.SAAD.    A.calcoaceticus-baumannii NOT DETECTED NOT DETECTED Final   Bacteroides fragilis NOT DETECTED NOT DETECTED Final   Enterobacterales NOT DETECTED NOT DETECTED Final   Enterobacter cloacae complex NOT DETECTED NOT DETECTED Final   Escherichia coli NOT DETECTED NOT DETECTED Final   Klebsiella aerogenes NOT DETECTED NOT DETECTED Final   Klebsiella oxytoca NOT DETECTED NOT DETECTED Final   Klebsiella pneumoniae NOT DETECTED NOT DETECTED Final   Proteus species NOT DETECTED NOT DETECTED Final   Salmonella species NOT DETECTED NOT DETECTED Final   Serratia marcescens NOT DETECTED NOT DETECTED Final   Haemophilus influenzae NOT DETECTED NOT DETECTED Final   Neisseria meningitidis NOT DETECTED NOT DETECTED Final   Pseudomonas aeruginosa NOT DETECTED NOT DETECTED Final   Stenotrophomonas maltophilia NOT DETECTED NOT DETECTED Final   Candida albicans NOT DETECTED NOT DETECTED Final   Candida auris NOT DETECTED NOT DETECTED Final   Candida glabrata NOT DETECTED NOT DETECTED Final   Candida krusei NOT DETECTED NOT DETECTED Final   Candida parapsilosis NOT DETECTED NOT DETECTED Final  Candida tropicalis NOT DETECTED NOT DETECTED Final   Cryptococcus neoformans/gattii NOT DETECTED NOT DETECTED Final    Comment: Performed at [facility], [place] [address] [place], [place] [address]  SARS Coronavirus 2 by RT PCR (hospital  order, performed in [facility] hospital lab) *cepheid single result test* Anterior Nasal Swab     Status: None   Collection Time: 03/25/22  5:42 PM   Specimen: Anterior Nasal Swab  Result Value Ref Range Status   SARS Coronavirus 2 by RT PCR NEGATIVE NEGATIVE Final    Comment: (NOTE) SARS-CoV-2 target nucleic acids are NOT DETECTED.  The SARS-CoV-2 RNA is generally detectable in upper and lower respiratory specimens during the acute phase of infection. The lowest concentration of SARS-CoV-2 viral copies this assay can detect is 250 copies / mL. A negative result does not preclude SARS-CoV-2 infection and should not be used as the sole basis for treatment or other patient management decisions.  A negative result may occur with improper specimen collection / handling, submission of specimen other than nasopharyngeal swab, presence of viral mutation(s) within the areas targeted by this assay, and inadequate number of viral copies (<250 copies / mL). A negative result must be combined with clinical observations, patient history, and epidemiological information.  Fact Sheet for Patients:   https://www.patel.info/  Fact Sheet for Healthcare Providers: https://hall.com/  This test is not yet approved or  cleared by the [place] FDA and has been authorized for detection and/or diagnosis of SARS-CoV-2 by FDA under an Emergency Use Authorization (EUA).  This EUA will remain in effect (meaning this test can be used) for the duration of the COVID-19 declaration under Section 564(b)(1) of the Act, 21 U.S.C. section 360bbb-3(b)(1), unless the authorization is terminated or revoked sooner.  Performed at [facility], [place] [address], [place], [place] [address]   MRSA Next Gen by PCR, Nasal     Status: None   Collection Time: 03/26/22  2:05 AM   Specimen: Nasal Mucosa; Nasal Swab  Result Value Ref Range Status   MRSA by PCR Next Gen NOT  DETECTED NOT DETECTED Final    Comment: (NOTE) The GeneXpert MRSA Assay (FDA approved for NASAL specimens only), is one component of a comprehensive MRSA colonization surveillance program. It is not intended to diagnose MRSA infection nor to guide or monitor treatment for MRSA infections. Test performance is not FDA approved in patients less than 70 years old. Performed at [facility], [place] [address], [place], [place] [address]   Culture, blood (Routine X 2) w Reflex to ID Panel     Status: None (Preliminary result)   Collection Time: 03/27/22  7:34 AM   Specimen: BLOOD RIGHT FOREARM  Result Value Ref Range Status   Specimen Description BLOOD RIGHT FOREARM  Final   Special Requests   Final    BOTTLES DRAWN AEROBIC AND ANAEROBIC Blood Culture adequate volume   Culture   Final    NO GROWTH < 12 HOURS Performed at [facility], [place] [address], [place], [place] [address]    Report Status PENDING  Incomplete  Culture, blood (Routine X 2) w Reflex to ID Panel     Status: None (Preliminary result)   Collection Time: 03/27/22  7:37 AM   Specimen: BLOOD  Result Value Ref Range Status   Specimen Description BLOOD LEFT ANTECUBITAL  Final   Special Requests   Final    BOTTLES DRAWN AEROBIC AND ANAEROBIC Blood Culture  results may not be optimal due to an inadequate volume of blood received in culture bottles   Culture   Final    NO GROWTH < 12 HOURS Performed at [facility] [address], [place], [place] [address]    Report Status PENDING  Incomplete         Radiology Studies: ECHOCARDIOGRAM COMPLETE  Result Date: 03/26/2022    ECHOCARDIOGRAM REPORT   Patient Name:   Valerie Rodgers Date of Exam: 03/26/2022 Medical Rec #:  [id number]       Height:       64.0 in Accession #:    [id number]      Weight:       178.4 lb Date of Birth:  August 17, 1950        BSA:          1.863 m Patient Age:    71 years        BP:           102/74 mmHg Patient Gender: F               HR:            79 bpm. Exam Location:  Inpatient Procedure: 2D Echo, 3D Echo, Color Doppler, Cardiac Doppler and Intracardiac            Opacification Agent Indications:     Elevated Troponin  History:         Patient has prior history of Echocardiogram examinations, most                  recent 11/15/2021. COPD; Arrythmias:Atrial Fibrillation.  Sonographer:     Bernadene Person RDCS Referring Phys:  [id number] Ilene Qua OPYD Diagnosing Phys: Adrian Prows MD IMPRESSIONS  1. 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 Left ventricular ejection fraction, by estimation, is 20 to 25%. Left ventricular ejection fraction by 3D volume is 29 %. The left ventricle has severely decreased function. The left ventricle demonstrates global hypokinesis. Left ventricular diastolic function could not be evaluated. There is the interventricular septum is flattened in systole and diastole, consistent with right ventricular pressure and volume overload.  2. Right ventricular systolic function is normal. The right ventricular size is normal. There is moderately elevated pulmonary artery systolic pressure.  3. Left atrial size was moderately dilated.  4. Right atrial size was moderately dilated.  5. The mitral valve is normal in structure. Moderate mitral valve regurgitation.  6. Tricuspid valve regurgitation is moderate.  7. The aortic valve is tricuspid. Aortic valve regurgitation is trivial. No aortic stenosis is present.  8. Pulmonic valve regurgitation is moderate. Comparison(s): Compared to 05/302023, EF marginally better from 20% to 25%. 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 Left ventricular ejection fraction, by estimation, is 20 to 25%. Left ventricular ejection fraction by 3D volume is 29 %. The left ventricle has severely decreased function. The left ventricle demonstrates global hypokinesis. Definity contrast agent was given IV to delineate the left ventricular endocardial borders. The  left ventricular internal cavity size was normal in size. There is no left ventricular hypertrophy. The interventricular septum is flattened in systole and diastole, consistent with right ventricular pressure and volume overload. Left ventricular diastolic function could not be evaluated. Right Ventricle: The right ventricular size is normal. No increase in right ventricular wall thickness. Right ventricular systolic function is normal. There is moderately elevated pulmonary artery systolic pressure. The tricuspid regurgitant velocity is 2.69 m/s, and with an  assumed right atrial pressure of 15 mmHg, the estimated right ventricular systolic pressure is [id number] mmHg. Left Atrium: Left atrial size was moderately dilated. Right Atrium: Right atrial size was moderately dilated. Pericardium: There is no evidence of pericardial effusion. Mitral Valve: The mitral valve is normal in structure. Moderate mitral valve regurgitation. Tricuspid Valve: The tricuspid valve is normal in structure. Tricuspid valve regurgitation is moderate. Aortic Valve: The aortic valve is tricuspid. Aortic valve regurgitation is trivial. No aortic stenosis is present. Pulmonic Valve: The pulmonic valve was grossly normal. Pulmonic valve regurgitation is moderate. Aorta: The aortic root is normal in size and structure. IAS/Shunts: No atrial level shunt detected by color flow Doppler.  LEFT VENTRICLE PLAX 2D LVIDd:         6.10 cm LVIDs:         4.70 cm LV PW:         0.90 cm         3D Volume EF LV IVS:        0.80 cm         LV 3D EF:    Left LVOT diam:     2.10 cm                      ventricul LV SV:         84                           ar LV SV Index:   45                           ejection LVOT Area:     3.46 cm                     fraction                                             by 3D                                             volume is LV Volumes (MOD)                            29 %. LV vol d, MOD    201.0 ml A2C: LV vol d, MOD    152.0  ml      3D Volume EF: A4C:                           3D EF:        29 % LV vol s, MOD    133.0 ml      LV EDV:       220 ml A2C:                           LV ESV:       155 ml LV vol s, MOD    108.0 ml      LV SV:        65 ml A4C:  LV SV MOD A2C:   68.0 ml LV SV MOD A4C:   152.0 ml LV SV MOD BP:    59.2 ml RIGHT VENTRICLE TAPSE (M-mode): 1.3 cm LEFT ATRIUM             Index        RIGHT ATRIUM           Index LA diam:        4.20 cm 2.25 cm/m   RA Area:     23.70 cm LA Vol (A2C):   86.8 ml 46.58 ml/m  RA Volume:   81.90 ml  43.95 ml/m LA Vol (A4C):   75.3 ml 40.41 ml/m LA Biplane Vol: 81.4 ml 43.68 ml/m  AORTIC VALVE             PULMONIC VALVE LVOT Vmax:   136.33 cm/s PR End Diast Vel: 8.29 msec LVOT Vmean:  91.133 cm/s LVOT VTI:    0.243 m  AORTA Ao Root diam: 3.10 cm Ao Asc diam:  3.60 cm TRICUSPID VALVE TR Peak grad:   28.9 mmHg TR Vmax:        269.00 cm/s  SHUNTS Systemic VTI:  0.24 m Systemic Diam: 2.10 cm Adrian Prows MD Electronically signed by Adrian Prows MD Signature Date/Time: 03/26/2022/8:41:38 PM    Final         Scheduled Meds:  atorvastatin  20 mg Oral QHS   carvedilol  3.125 mg Oral BID WC   ivabradine  5 mg Oral BID WC   methylPREDNISolone (SOLU-MEDROL) injection  80 mg Intravenous Daily   midodrine  10 mg Oral TID WC   sodium chloride flush  3 mL Intravenous Q12H   Continuous Infusions:  ampicillin-sulbactam (UNASYN) IV 3 g (03/28/22 1035)     LOS: 3 days    Time spent: 45 minutes spent on chart review, discussion with nursing staff, consultants, updating family and interview/physical exam; more than 50% of that time was spent in counseling and/or coordination of care.    Geradine Girt, DO [facility] Available via Epic secure chat 7am-7pm After these hours, please refer to coverage provider listed on [email or web address] 03/28/2022, 11:20 AM

## 2022-03-28 NOTE — Progress Notes (Signed)
Dr. Aileen Fass paged about patients hypotensive state. Orders received to Midodrin and a 245ml fluid Bolus. After calling patient by name she woke up more alert and was asking for ice water and graham crackers. Clonidine was held tonight due to soft b/p's. Patient now awake, alert and talking to RN, Will continue to monitor closely.

## 2022-03-29 DIAGNOSIS — J9621 Acute and chronic respiratory failure with hypoxia: Secondary | ICD-10-CM | POA: Diagnosis not present

## 2022-03-29 DIAGNOSIS — J9622 Acute and chronic respiratory failure with hypercapnia: Secondary | ICD-10-CM | POA: Diagnosis not present

## 2022-03-29 LAB — CBC WITH DIFFERENTIAL/PLATELET
Abs Immature Granulocytes: 0.06 10*3/uL (ref 0.00–0.07)
Basophils Absolute: 0 10*3/uL (ref 0.0–0.1)
Basophils Relative: 0 %
Eosinophils Absolute: 0 10*3/uL (ref 0.0–0.5)
Eosinophils Relative: 0 %
HCT: 37.6 % (ref 36.0–46.0)
Hemoglobin: 11.6 g/dL — ABNORMAL LOW (ref 12.0–15.0)
Immature Granulocytes: 1 %
Lymphocytes Relative: 6 %
Lymphs Abs: 0.6 10*3/uL — ABNORMAL LOW (ref 0.7–4.0)
MCH: 26.5 pg (ref 26.0–34.0)
MCHC: 30.9 g/dL (ref 30.0–36.0)
MCV: 85.8 fL (ref 80.0–100.0)
Monocytes Absolute: 0.5 10*3/uL (ref 0.1–1.0)
Monocytes Relative: 5 %
Neutro Abs: 9.7 10*3/uL — ABNORMAL HIGH (ref 1.7–7.7)
Neutrophils Relative %: 88 %
Platelets: UNDETERMINED 10*3/uL (ref 150–400)
RBC: 4.38 MIL/uL (ref 3.87–5.11)
RDW: 18.7 % — ABNORMAL HIGH (ref 11.5–15.5)
WBC: 10.9 10*3/uL — ABNORMAL HIGH (ref 4.0–10.5)
nRBC: 0.4 % — ABNORMAL HIGH (ref 0.0–0.2)

## 2022-03-29 LAB — BASIC METABOLIC PANEL
Anion gap: 10 (ref 5–15)
BUN: 37 mg/dL — ABNORMAL HIGH (ref 8–23)
CO2: 30 mmol/L (ref 22–32)
Calcium: 8.5 mg/dL — ABNORMAL LOW (ref 8.9–10.3)
Chloride: 99 mmol/L (ref 98–111)
Creatinine, Ser: 1.1 mg/dL — ABNORMAL HIGH (ref 0.44–1.00)
GFR, Estimated: 53 mL/min — ABNORMAL LOW (ref >60)
Glucose, Bld: 101 mg/dL — ABNORMAL HIGH (ref 70–99)
Potassium: 4.6 mmol/L (ref 3.5–5.1)
Sodium: 139 mmol/L (ref 135–145)

## 2022-03-29 NOTE — Plan of Care (Signed)

## 2022-03-29 NOTE — Progress Notes (Signed)
Mobility Specialist Progress Note    03/29/22 1548  Mobility  Activity Ambulated with assistance in room  Level of Assistance Moderate assist, patient does 50-74%  Assistive Device Front wheel walker  Distance Ambulated (ft) 15 ft  Activity Response Tolerated well  Mobility Referral Yes  $Mobility charge 1 Mobility   Pre-Mobility: 76 HR Post-Mobility: 73 HR  Pt received in bed and agreeable.To BSC for void and pericare from BM. Left in chair with call bell in reach and alarm on.   [facility] Mobility Specialist  Secure Chat Only

## 2022-03-29 NOTE — Progress Notes (Addendum)
ID Brief Note   Afebrile, leukocytosis has resolved  03/25/22 blood cx 1/2 sets with aerococcus viridans, acinetobacter spp and Group A strep, updated by Micro today   03/27/22 repeat blood cx 03/27/22 No growth in 2 days   Unclear significance given multiple organisms growing in blood cultures.. Patient was doing well while on targeted tx for group A strep bacteremia before switching to Unasyn due to growth of acinetobacter. No central lines. Comfortable on 2 L [place]. Chest xray with mild left infrahilar atelectasis and/or infiltrate.   Most likely culprit is Group A strep here Can switch Unasyn to levofloxacin tomorrow. EOT 04/03/22 Continue wound care Follow up with Vascular  D/w ID pharm Dn and DR Verlon Au  ID will sign off. Please recall if needed.  Rosiland Oz, MD Infectious Disease Physician [facility] [address] [place], [place] [address] Phone: [phone]  Fax: [phone]

## 2022-03-29 NOTE — Progress Notes (Signed)
BIPAP is PRN no distress noted at this time. 

## 2022-03-29 NOTE — Progress Notes (Signed)
Physical Therapy Treatment Patient Details Name: Valerie Rodgers MRN: [id number] DOB: Nov 09, 1950 Today's Date: 03/29/2022   History of Present Illness Pt is 71 yo female admitted 03/25/22 with tremor and somnolence, with COPD exacerbation. PMH to include Afib, COPD on supplemental O2, leg wounds, CHF, substance abuse, bipolar, CKD, and cirrhosis.    PT Comments    Pt received supine and agreeable to session with focus on safe transfers and standing tolerance for increased activity tolerance and LE strength. Pt endorsing increased fatigue this session limiting gait progression. Pt able to come to standing with min assist to steady and power up, maintain with min guard for ~3 mins and side step to [facility] with min assist to manage RW and for cues for task attention. Pt continues to be mildly impulsive with mobility and need reminders for task frequently. Pt continues to benefit from skilled PT services to progress toward functional mobility goals.    Recommendations for follow up therapy are one component of a multi-disciplinary discharge planning process, led by the attending physician.  Recommendations may be updated based on patient status, additional functional criteria and insurance authorization.  Follow Up Recommendations  Skilled nursing-short term rehab (<3 hours/day) Can patient physically be transported by private vehicle: Yes   Assistance Recommended at Discharge Frequent or constant Supervision/Assistance  Patient can return home with the following A lot of help with bathing/dressing/bathroom;Assistance with cooking/housework;Assist for transportation;Help with stairs or ramp for entrance;A little help with walking and/or transfers   Equipment Recommendations  None recommended by PT    Recommendations for Other Services       Precautions / Restrictions Precautions Precautions: Fall Precaution Comments: watch SpO2, baseline 2-4L Restrictions Weight Bearing Restrictions: No      Mobility  Bed Mobility Overal bed mobility: Needs Assistance Bed Mobility: Supine to Sit, Sit to Supine     Supine to sit: HOB elevated, Min assist Sit to supine: Mod assist   General bed mobility comments: assist for LE and trunk elevation    Transfers Overall transfer level: Needs assistance Equipment used: Rolling walker (2 wheels) Transfers: Sit to/from Stand, Bed to chair/wheelchair/BSC Sit to Stand: Min assist           General transfer comment: min A and lateral stepping and the bed side with min A overall. requires constant cues for attention. Pt declining further mobility secondary to pain and faitgue    Ambulation/Gait                   Stairs             Wheelchair Mobility    Modified Rankin (Stroke Patients Only)       Balance Overall balance assessment: Needs assistance Sitting-balance support: Single extremity supported, Feet supported Sitting balance-Leahy Scale: Fair Sitting balance - Comments: impulsive wtih lateral leaning at the bed side   Standing balance support: During functional activity, Reliant on assistive device for balance, Bilateral upper extremity supported Standing balance-Leahy Scale: Poor Standing balance comment: RW and min assist for static standing, max cues for safety with RW                            Cognition Arousal/Alertness: Awake/alert Behavior During Therapy: Impulsive Overall Cognitive Status: No family/caregiver present to determine baseline cognitive functioning Area of Impairment: Attention, Memory, Following commands, Safety/judgement, Awareness, Problem solving  Current Attention Level: Sustained Memory: Decreased short-term memory Following Commands: Follows one step commands inconsistently, Follows one step commands with increased time Safety/Judgement: Decreased awareness of safety, Decreased awareness of deficits Awareness: Intellectual Problem  Solving: Slow processing, Requires verbal cues, Requires tactile cues, Difficulty sequencing, Decreased initiation General Comments: decreased insight to deficits. easily distracted and self distracting. Needs constant cues for attention and re-direction        Exercises      General Comments General comments (skin integrity, edema, etc.): VSS on RA      Pertinent Vitals/Pain Pain Assessment Pain Assessment: Faces Faces Pain Scale: Hurts even more Pain Location: wounds on BLEs Pain Descriptors / Indicators: Constant, Grimacing Pain Intervention(s): Monitored during session, Limited activity within patient's tolerance    Home Living                          Prior Function            PT Goals (current goals can now be found in the care plan section) Acute Rehab PT Goals PT Goal Formulation: With patient Time For Goal Achievement: 04/10/22    Frequency    Min 3X/week      PT Plan      Co-evaluation              AM-PAC PT "6 Clicks" Mobility   Outcome Measure  Help needed turning from your back to your side while in a flat bed without using bedrails?: A Little Help needed moving from lying on your back to sitting on the side of a flat bed without using bedrails?: A Lot Help needed moving to and from a bed to a chair (including a wheelchair)?: A Lot Help needed standing up from a chair using your arms (e.g., wheelchair or bedside chair)?: A Lot Help needed to walk in hospital room?: A Lot Help needed climbing 3-5 steps with a railing? : Total 6 Click Score: 12    End of Session   Activity Tolerance: Patient limited by fatigue Patient left: with call bell/phone within reach;in bed;with bed alarm set Nurse Communication: Mobility status PT Visit Diagnosis: Unsteadiness on feet (R26.81);Other abnormalities of gait and mobility (R26.89);Muscle weakness (generalized) (M62.81)     Time: 9381-0175 PT Time Calculation (min) (ACUTE ONLY): 12  min  Charges:  $Therapeutic Activity: 8-22 mins                     Larico Dimock R. PTA Acute Rehabilitation Services Office: [place] 03/29/2022, 3:40 PM

## 2022-03-29 NOTE — Progress Notes (Addendum)
PROGRESS NOTE   Valerie Rodgers  QJF:[id number] DOB: 08/16/1950 DOA: 03/25/2022 PCP: Pcp, No  Brief Narrative:  71 year old black female boardinghouse resident Known underlying COPD and chronic hypoxic respiratory failure Chronic combined systolic and diastolic heart failure, EF less than 20% CKD 3B Probable underlying cirrhosis of liver Negative Lexiscan 07/23/2021 fixed anterior moderate inferolateral defects EF at the time 21% Substance abuse disorder-cocaine-chronic continued smoker Chronic lower extremity wounds since March 2023 Atrial fibrillation not on anticoagulation CHADVASC >4  Recent admission 9/24 through 03/20/2022 with edema weakness and had acute on chronic systolic dysfunction-she was diuresed 7 L given midodrine started meds and declined cessation of cocaine  Readmitted 03/25/2022 with toxic metabolic encephalopathy new tremor leukocytosis of 19 troponin 272 BNP 1853--- it was felt that she was hypercarbic on admission and started on BiPAP Ammonia was slightly elevated at 48 Eventually she was found to have group A strep thought to be associated with lower extremity cellulitis and wound infection ID was consulted  Hospital-Problem based course  Sepsis on admission secondary to GaS bacteremia-on cultures Aerococcus viridans, Acinetobacter additionally Defer to ID-changed to cefazolin 2 g every 8 on 10/8 and plan was to continue this until repeat cultures---?  Cefadroxil Repeat culture from 10/9 shows no growth Has severe pain c movements of lower extremities-we will discuss discontinuation of IV fentanyl-continue Naprosyn 500 twice daily can continue Robaxin every 8 as needed spasm  Hypercarbia causing toxic metabolic encephalopathy on admission Mentation much improved continue oxygen-wean to 2 L [place] if able to tolerate  COPD on home oxygen Continues on Solu-Medrol which we will wean to prednisone 40 today, continue albuterol every 4 as needed Do not anticipate needing  BiPAP during the daytime  Elevated troponin on admission Not characterized but stress test from earlier this year showed no acute defect with negative Lexiscan Echo this admission EF 25-30% global hypokinesis?  Infiltrative cardiomyopathy-I will ask Dr. Einar Gip an opinion As having no chest pain do not think any other work-up needed  Systolic diastolic heart failure with grade 3 diastolic dysfunction Continues on midodrine 5 3 times daily Euvolemic at this time baseline weight about 80 kg--patient is 1.1 L positive Corlanor 5 twice daily, Coreg 3.125 twice daily Continues on Lasix 40 daily  Cardiorenal syndrome, CKD 3 Caution with diuresis-creatinine at baseline about 1.2  Chronic venous congestion liver Slightly elevated alk phos LFTs slightly up-monitor trends Hepatitis panels are negative, RPR negative   DVT prophylaxis: scd Code Status: DNR Family Communication: none Disposition:  Status is: Inpatient Remains inpatient appropriate because:   Requires more stability and possibly more diuresis-awaiting final recommendations from ID   Consultants:  Infectious disease  Procedures: Echo  Antimicrobials: Unasyn   Subjective: Well--pain with moving LE's passively, no CP No fever no chills no n/v  Quite painful to change dressings  Objective: Vitals:   03/29/22 0300 03/29/22 0400 03/29/22 0700 03/29/22 1100  BP: 113/89 113/83 119/79 111/87  Pulse: 71 64 75 66  Resp: (!) $RemoveB'21 14 18 'CKouOlIN$ (!) 22  Temp: 98.7 F (37.1 C)  98.7 F (37.1 C) 98.9 F (37.2 C)  TempSrc: Oral  Oral Oral  SpO2: (!) 88% 96% 100% 95%  Weight:      Height:        Intake/Output Summary (Last 24 hours) at 03/29/2022 1334 Last data filed at 03/29/2022 0700 Gross per 24 hour  Intake 1300 ml  Output 475 ml  Net 825 ml   Filed Weights   03/25/22 1528 03/26/22 0519  03/28/22 0330  Weight: 79 kg 80.9 kg 83.3 kg    Examination:  EOMI NCAT no focal deficit-no icterus no pallor no LAN S1-S2 no  murmur no rub no gallop Abdomen soft no rebound no guarding neurologically intact moving 4 limbs equally Does have lower extremity edema up to knees and excoriated areas as below          Data Reviewed: personally reviewed   CBC    Component Value Date/Time   WBC 10.9 (H) 03/29/2022 0357   RBC 4.38 03/29/2022 0357   HGB 11.6 (L) 03/29/2022 0357   HCT 37.6 03/29/2022 0357   PLT PLATELET CLUMPS NOTED ON SMEAR, UNABLE TO ESTIMATE 03/29/2022 0357   MCV 85.8 03/29/2022 0357   MCH 26.5 03/29/2022 0357   MCHC 30.9 03/29/2022 0357   RDW 18.7 (H) 03/29/2022 0357   LYMPHSABS 0.6 (L) 03/29/2022 0357   MONOABS 0.5 03/29/2022 0357   EOSABS 0.0 03/29/2022 0357   BASOSABS 0.0 03/29/2022 0357      Latest Ref Rng & Units 03/29/2022    3:57 AM 03/28/2022   11:04 AM 03/27/2022   12:30 AM  CMP  Glucose 70 - 99 mg/dL 101  150  137   BUN 8 - 23 mg/dL 37  34  33   Creatinine 0.44 - 1.00 mg/dL 1.10  1.21  1.46   Sodium 135 - 145 mmol/L 139  139  146   Potassium 3.5 - 5.1 mmol/L 4.6  3.8  4.9   Chloride 98 - 111 mmol/L 99  102  105   CO2 22 - 32 mmol/L $RemoveB'30  29  25   '[id number]$ Calcium 8.9 - 10.3 mg/dL 8.5  7.8  8.5   Total Protein 6.5 - 8.1 g/dL   5.8   Total Bilirubin 0.3 - 1.2 mg/dL   0.4   Alkaline Phos 38 - 126 U/L   133   AST 15 - 41 U/L   49   ALT 0 - 44 U/L   44      Radiology Studies: No results found.   Scheduled Meds:  atorvastatin  20 mg Oral QHS   carvedilol  3.125 mg Oral BID WC   ivabradine  5 mg Oral BID WC   methylPREDNISolone (SOLU-MEDROL) injection  80 mg Intravenous Daily   midodrine  5 mg Oral TID WC   sodium chloride flush  3 mL Intravenous Q12H   Continuous Infusions:  ampicillin-sulbactam (UNASYN) IV 3 g (03/29/22 0905)     LOS: 4 days   Time spent: 40  Nita Sells, MD [facility] To contact the attending provider between 7A-7P or the covering provider during after hours 7P-7A, please log into the web site [email or web address] and access using  universal [place] password for that web site. If you do not have the password, please call the hospital operator.  03/29/2022, 1:34 PM

## 2022-03-30 DIAGNOSIS — R400 Somnolence: Secondary | ICD-10-CM

## 2022-03-30 DIAGNOSIS — J9621 Acute and chronic respiratory failure with hypoxia: Secondary | ICD-10-CM | POA: Diagnosis not present

## 2022-03-30 DIAGNOSIS — J9622 Acute and chronic respiratory failure with hypercapnia: Secondary | ICD-10-CM | POA: Diagnosis not present

## 2022-03-30 LAB — CULTURE, BLOOD (ROUTINE X 2): Culture: NO GROWTH

## 2022-03-30 LAB — COMPREHENSIVE METABOLIC PANEL
ALT: 29 U/L (ref 0–44)
AST: 42 U/L — ABNORMAL HIGH (ref 15–41)
Albumin: 2.7 g/dL — ABNORMAL LOW (ref 3.5–5.0)
Alkaline Phosphatase: 138 U/L — ABNORMAL HIGH (ref 38–126)
Anion gap: 7 (ref 5–15)
BUN: 39 mg/dL — ABNORMAL HIGH (ref 8–23)
CO2: 33 mmol/L — ABNORMAL HIGH (ref 22–32)
Calcium: 8.5 mg/dL — ABNORMAL LOW (ref 8.9–10.3)
Chloride: 100 mmol/L (ref 98–111)
Creatinine, Ser: 1.25 mg/dL — ABNORMAL HIGH (ref 0.44–1.00)
GFR, Estimated: 46 mL/min — ABNORMAL LOW (ref >60)
Glucose, Bld: 112 mg/dL — ABNORMAL HIGH (ref 70–99)
Potassium: 4.3 mmol/L (ref 3.5–5.1)
Sodium: 140 mmol/L (ref 135–145)
Total Bilirubin: 0.5 mg/dL (ref 0.3–1.2)
Total Protein: 6.2 g/dL — ABNORMAL LOW (ref 6.5–8.1)

## 2022-03-30 LAB — BLOOD GAS, ARTERIAL
Acid-Base Excess: 7.4 mmol/L — ABNORMAL HIGH (ref 0.0–2.0)
Bicarbonate: 35 mmol/L — ABNORMAL HIGH (ref 20.0–28.0)
Drawn by: 270221
O2 Saturation: 77.7 %
Patient temperature: 36.5
pCO2 arterial: 61 mmHg — ABNORMAL HIGH (ref 32–48)
pH, Arterial: 7.37 (ref 7.35–7.45)
pO2, Arterial: 48 mmHg — ABNORMAL LOW (ref 83–108)

## 2022-03-30 MED ORDER — LEVOFLOXACIN 500 MG PO TABS
500.0000 mg | ORAL_TABLET | Freq: Every day | ORAL | Status: DC
  Filled 2022-03-30: qty 1

## 2022-03-30 MED ORDER — LORAZEPAM 2 MG/ML IJ SOLN
0.0000 mg | INTRAMUSCULAR | Status: DC
  Administered 2022-03-30: 2 mg via INTRAVENOUS
  Filled 2022-03-30: qty 1
  Filled 2022-03-30: qty 2

## 2022-03-30 MED ORDER — LORAZEPAM 1 MG PO TABS: 1.0000 mg | ORAL_TABLET | ORAL | Status: AC | PRN

## 2022-03-30 MED ORDER — PREDNISONE 50 MG PO TABS: 60.0000 mg | ORAL_TABLET | Freq: Every day | ORAL | Status: DC

## 2022-03-30 MED ORDER — LORAZEPAM 2 MG/ML IJ SOLN
0.0000 mg | Freq: Three times a day (TID) | INTRAMUSCULAR | Status: DC
  Filled 2022-03-30: qty 1

## 2022-03-30 MED ORDER — LORAZEPAM 2 MG/ML IJ SOLN
1.0000 mg | INTRAMUSCULAR | Status: AC | PRN
  Administered 2022-03-30: 2 mg via INTRAVENOUS
  Administered 2022-03-30: 3 mg via INTRAVENOUS
  Administered 2022-03-31: 4 mg via INTRAVENOUS
  Administered 2022-03-31: 1 mg via INTRAVENOUS
  Administered 2022-03-31 (×2): 3 mg via INTRAVENOUS
  Administered 2022-03-31 (×2): 2 mg via INTRAVENOUS
  Administered 2022-04-01: 3 mg via INTRAVENOUS
  Administered 2022-04-02: 1 mg via INTRAVENOUS
  Administered 2022-04-02 (×3): 2 mg via INTRAVENOUS
  Filled 2022-03-30: qty 2
  Filled 2022-03-30: qty 1
  Filled 2022-03-30: qty 2
  Filled 2022-03-30 (×4): qty 1
  Filled 2022-03-30: qty 2
  Filled 2022-03-30 (×2): qty 1
  Filled 2022-03-30: qty 2

## 2022-03-30 MED ORDER — DIAZEPAM 2 MG PO TABS
2.0000 mg | ORAL_TABLET | Freq: Once | ORAL | Status: AC
  Administered 2022-03-30: 2 mg via ORAL
  Filled 2022-03-30: qty 1

## 2022-03-30 MED ORDER — PHENOBARBITAL SODIUM 65 MG/ML IJ SOLN
65.0000 mg | Freq: Once | INTRAMUSCULAR | Status: AC
  Administered 2022-03-30: 65 mg via INTRAVENOUS
  Filled 2022-03-30: qty 1

## 2022-03-30 MED ORDER — IPRATROPIUM-ALBUTEROL 0.5-2.5 (3) MG/3ML IN SOLN: 3.0000 mL | RESPIRATORY_TRACT | Status: DC | PRN

## 2022-03-30 MED ORDER — PHENOBARBITAL SODIUM 65 MG/ML IJ SOLN
65.0000 mg | Freq: Two times a day (BID) | INTRAMUSCULAR | Status: DC | PRN
  Administered 2022-03-31: 65 mg via INTRAVENOUS
  Filled 2022-03-30 (×2): qty 1

## 2022-03-30 NOTE — Progress Notes (Signed)
During assessment nurse noted that patient is irritable, anxious, and itching continuously. Nurse reached out provider Samtani, MD. No new orders at this time. Will continue to monitor.

## 2022-03-30 NOTE — Progress Notes (Signed)
Mobility Specialist Progress Note   03/30/22 0930  Mobility  Activity Contraindicated/medical hold   Pt not appropriate for mobility specialist at this time given change in cognition/mentation as advised per RN. Will hold and continue to follow.  Valerie Rodgers, [place], [place]  VLDKC:[phone] Office: [phone]

## 2022-03-30 NOTE — Progress Notes (Signed)
Rapid response called due to increase agitation with patient and removal of med equipment. Primary nurse spoke with rapid response Kelli Churn, RN. Rapid response in route to assess patient. Will continue to monitor.

## 2022-03-30 NOTE — Significant Event (Signed)
Rapid Response Event Note   Reason for Call :  Agitation  Initial Focused Assessment:  Patient intermittently agitated then somnolent. When she is agitated she moves all extremities and yells, but does not interact with staff.  She does not follow commands or answer questions.  She will reach for Bipap mask and try to pull it off.  BP 137/87  HR 70-90s  RR 24  o2 sat 95-96% on Bipap  Lung sounds clear Heart tones irregular Rhythm: SR with PACs, AF, PVCs    Interventions:  65mg  Phenobarbital IV  Reassessment: She slept for a few minutes post phenobarb then became increasingly agitated and restless. CIWA 18: 3mg  Ativan given IV   Plan of Care:  Reassess CIWA in an hour and treat according to score. RN to call if additional assistance needed.   Event Summary:   MD Notified: Dr Verlon Au and Dr Halford Chessman prior to my arrival Call Time: 1807 Arrival Time: [place] End Time: 1920  Raliegh Ip, RN

## 2022-03-30 NOTE — Progress Notes (Addendum)
Critical care provider at bedside assessing patient, primary nurse giving updates to provider Halford Chessman, MD. Provider will perform med reconil for patient. Will continue to monitor.

## 2022-03-30 NOTE — Progress Notes (Signed)
Due to change in patient condition nurse unable to fully assess leg wounds of patient. Will continue to monitor.

## 2022-03-30 NOTE — Progress Notes (Signed)
OT Cancellation Note  Patient Details Name: Valerie Rodgers MRN: [id number] DOB: 11/07/1950   Cancelled Treatment:    Reason Eval/Treat Not Completed: Patient declined, no reason specified (Patient stated he did not want to participate at this time due to lack of sleep. Will attempt later today as schedule permits.) [name], [name]  Office [phone]  [name] 03/30/2022, 8:15 AM

## 2022-03-30 NOTE — Progress Notes (Addendum)
Patient reevaluated at bedside she is little bit more coherent states that she got some "rest"-however she is undressed in the bed for some reason I think that she should be placed on the BiPAP when she is asleep as her ABG shows mild hypercarbia and nursing has been informed.  CCM consulted--started CIWA--Can give PRN pehnobarb  Verneita Griffes, MD [facility] 2:26 PM

## 2022-03-30 NOTE — Plan of Care (Signed)

## 2022-03-30 NOTE — Progress Notes (Signed)
PROGRESS NOTE   Valerie Rodgers  FBP:[id number] DOB: [birth year] DOA: 03/25/2022 PCP: Pcp, No  Brief Narrative:  71 year old black female boardinghouse resident Known underlying COPD and chronic hypoxic respiratory failure Chronic combined systolic and diastolic heart failure, EF less than 20% CKD 3B Probable underlying cirrhosis of liver Negative Lexiscan 07/23/2021 fixed anterior moderate inferolateral defects EF at the time 21% Substance abuse disorder-cocaine-chronic continued smoker Chronic lower extremity wounds since March 2023 Atrial fibrillation not on anticoagulation CHADVASC >4  Recent admission 9/24 through 03/20/2022 with edema weakness and had acute on chronic systolic dysfunction-she was diuresed 7 L given midodrine started meds and declined cessation of cocaine  Readmitted 03/25/2022 with toxic metabolic encephalopathy new tremor leukocytosis of 19 troponin 272 BNP 1853--- it was felt that she was hypercarbic on admission and started on BiPAP Ammonia was slightly elevated at 48 Eventually she was found to have group A strep thought to be associated with lower extremity cellulitis and wound infection ID was consulted  Hospital-Problem based course  Sepsis on admission secondary to GaS bacteremia-on cultures Aerococcus viridans, Acinetobacter additionally ID recs Levaquin to complete abx on 10/16 [10 days] Has severe pain c movements of lower extremities- Hold fentanyl -continue Naprosyn 500 twice daily can continue Robaxin every 8 as needed spasm  Hypercarbia causing toxic metabolic encephalopathy on admission Now confusion again today--lunikely to be withdrawal--- given 1 dose of diazepam as she states she did not sleep well overnight Re-eval closely if persists--given confusion today get ABG  COPD on home oxygen Continues on Solu-Medrol which we will wean to prednisone 40 today, continue albuterol every 4 as needed  Elevated troponin on admission Not characterized but  stress test from earlier this year showed no acute defect with negative Lexiscan Echo this admission EF 25-30% global hypokinesis?  Infiltrative cardiomyopathy-d/w Dr. Marletta Lor can start work-up here- get Upep and Spep and consider cardiac MRI--their office will   Systolic diastolic heart failure with grade 3 diastolic dysfunction Continues on midodrine 5 3 times daily Euvolemic at this time baseline weight about 80 kg--patient is +800 positive Corlanor 5 twice daily, Coreg 3.125 twice daily Continues on Lasix 40 daily  Cardiorenal syndrome, CKD 3 Caution with diuresis-creatinine at baseline about 1.2  Chronic venous congestion liver Slightly elevated alk phos LFTs slightly up-monitor trends-ultrasound this admission showed cholelithiasis without cholecystitis Hepatitis panels are negative, RPR negative   DVT prophylaxis: scd Code Status: DNR Family Communication: none Disposition:  Status is: Inpatient Remains inpatient appropriate because:   Today is confused not ready for discharge   Consultants:  Infectious disease  Procedures: Echo  Antimicrobials: Unasyn--> Levaquin   Subjective:  Confused overnight required Atarax-remains coherent but still slightly less responsive than yesterday Note that she has not been on BiPAP and may be retaining CO2 She is able to answer questions and oriented but is talking somewhat nonsensically also  Objective: Vitals:   03/30/22 0301 03/30/22 0703 03/30/22 0802 03/30/22 0803  BP: (!) 139/106 (!) 121/100 108/63 110/65  Pulse: 74  82 81  Resp: (!) 21 20 (!) 28 19  Temp: 98 F (36.7 C) (!) 96.3 F (35.7 C) 97.8 F (36.6 C) 97.8 F (36.6 C)  TempSrc: Oral Axillary Oral   SpO2: 90%  95% 93%  Weight: 83.7 kg     Height:        Intake/Output Summary (Last 24 hours) at 03/30/2022 1126 Last data filed at 03/30/2022 0303 Gross per 24 hour  Intake --  Output 350 ml  Net -350 ml    Filed Weights   03/26/22 0519 03/28/22 0330  03/30/22 0301  Weight: 80.9 kg 83.3 kg 83.7 kg    Examination:  Rambling conversation but oriented some keeps eyes closed most of the time No icterus no pallor CTA B S1-S2 no murmur sinus rhythm   Data Reviewed: personally reviewed   CBC    Component Value Date/Time   WBC 10.9 (H) 03/29/2022 0357   RBC 4.38 03/29/2022 0357   HGB 11.6 (L) 03/29/2022 0357   HCT 37.6 03/29/2022 0357   PLT PLATELET CLUMPS NOTED ON SMEAR, UNABLE TO ESTIMATE 03/29/2022 0357   MCV 85.8 03/29/2022 0357   MCH 26.5 03/29/2022 0357   MCHC 30.9 03/29/2022 0357   RDW 18.7 (H) 03/29/2022 0357   LYMPHSABS 0.6 (L) 03/29/2022 0357   MONOABS 0.5 03/29/2022 0357   EOSABS 0.0 03/29/2022 0357   BASOSABS 0.0 03/29/2022 0357      Latest Ref Rng & Units 03/30/2022   12:16 AM 03/29/2022    3:57 AM 03/28/2022   11:04 AM  CMP  Glucose 70 - 99 mg/dL 112  101  150   BUN 8 - 23 mg/dL 39  37  34   Creatinine 0.44 - 1.00 mg/dL 1.25  1.10  1.21   Sodium 135 - 145 mmol/L 140  139  139   Potassium 3.5 - 5.1 mmol/L 4.3  4.6  3.8   Chloride 98 - 111 mmol/L 100  99  102   CO2 22 - 32 mmol/L 33  30  29   Calcium 8.9 - 10.3 mg/dL 8.5  8.5  7.8   Total Protein 6.5 - 8.1 g/dL 6.2     Total Bilirubin 0.3 - 1.2 mg/dL 0.5     Alkaline Phos 38 - 126 U/L 138     AST 15 - 41 U/L 42     ALT 0 - 44 U/L 29        Radiology Studies: No results found.   Scheduled Meds:  atorvastatin  20 mg Oral QHS   carvedilol  3.125 mg Oral BID WC   diazepam  2 mg Oral Once   ivabradine  5 mg Oral BID WC   [START ON 03/31/2022] levofloxacin  500 mg Oral Daily   methylPREDNISolone (SOLU-MEDROL) injection  80 mg Intravenous Daily   sodium chloride flush  3 mL Intravenous Q12H   Continuous Infusions:  ampicillin-sulbactam (UNASYN) IV 3 g (03/30/22 0417)     LOS: 5 days   Time spent: [place], MD [facility] To contact the attending provider between 7A-7P or the covering provider during after hours 7P-7A,  please log into the web site [email or web address] and access using universal [place] password for that web site. If you do not have the password, please call the hospital operator.  03/30/2022, 11:26 AM

## 2022-03-30 NOTE — Consult Note (Signed)
NAME:  Valerie Rodgers, MRN:  [id number], DOB:  Jun 04, 1951, LOS: 5 ADMISSION DATE:  03/25/2022, CONSULTATION DATE:  03/30/2022 REFERRING MD:  Dr. Verlon Au, [facility], CHIEF COMPLAINT:  AMS   History of Present Illness:  71 yo female smoker brought to ER with tremors, leg sweeping with weeping wounds.  She was admitted by hospitalist for COPD exacerbation and cellulitis of her legs.  UDS positive for cocaine.  Found to have bacteremia and ID consulted.  She developed worsening agitation on 10/12 with concern for withdrawal.  PCCM consulted to assist with management.  Pertinent  Medical History  Arthritis, Bipolar, Cataract, Cocaine and Marijuana abuse, HLD, Diastolic/Systolic CHF, COPD on home oxygen, CKD 3b, A fib  Significant Hospital Events: Including procedures, antibiotic start and stop dates in addition to other pertinent events   10/07 Admit 10/08 ID consulted 10/09 transient hypotension 10/12 agitated delirium, started on Bipap  Studies:  Abd u/s 03/25/22 >> GB thickening Echo 03/26/22 >> EF 20 to 25%, mod elevation in PASP, mod LA/RA dilation, mod MR, mod TR, mod PR  Interim History / Subjective:  She is not able to provide any history.  Objective   Blood pressure (!) 146/94, pulse 75, temperature 97.8 F (36.6 C), resp. rate (!) 21, height 5\' 4"  (1.626 m), weight 83.7 kg, SpO2 (!) 75 %.        Intake/Output Summary (Last 24 hours) at 03/30/2022 1705 Last data filed at 03/30/2022 0303 Gross per 24 hour  Intake --  Output 350 ml  Net -350 ml   Filed Weights   03/26/22 0519 03/28/22 0330 03/30/22 0301  Weight: 80.9 kg 83.3 kg 83.7 kg    Examination:  General - somnolent Eyes - pupils reactive ENT - Bipap mask on Cardiac - regular rate/rhythm, no murmur Chest - diminished breath sounds Abdomen - soft, non tender, + bowel sounds Extremities - 2+ edema, lower legs in wrap Skin - no rashes Neuro - not following commands  Resolved Hospital Problem list      Assessment & Plan:   Agitated delirium with concern for withdrawal.   - admission UDS positive for cocaine and reported hx of ETOH - CIWA with prn ativan - can add phenobarbital as needed - don't think she needs precedex at this time  Chronic hypoxic/hypercapnic respiratory failure. - from COPD and possible sleep disordered breathing - DNR/DNI - Bipap as needed  - goal SpO2 90 to 95%  Hx of COPD. - prn duoneb  Lower legs cellulitis with bacteremia. - Abx per primary team  Chronic diastolic/systolic CHF. Elevated troponin from demand ischemia related to cocaine abuse. - monitor hemodynamics  CKD 3b. - f/u BMET  Best Practice (right click and "Reselect all SmartList Selections" daily)   Diet/type: Regular consistency (see orders) DVT prophylaxis: SCD GI prophylaxis: N/A Lines: N/A Foley:  N/A Code Status:  DNR Last date of multidisciplinary goals of care discussion []   Labs   CBC: Recent Labs  Lab 03/25/22 1733 03/25/22 1824 03/25/22 1858 03/26/22 0548 03/27/22 0030 03/29/22 0357  WBC 19.2*  --   --  15.2* 15.0* 10.9*  NEUTROABS 17.0*  --   --   --   --  9.7*  HGB 12.3 13.9 13.3 11.7* 11.4* 11.6*  HCT 39.8 41.0 39.0 36.3 36.8 37.6  MCV 88.6  --   --  83.8 86.8 85.8  PLT PLATELET CLUMPS NOTED ON SMEAR, UNABLE TO ESTIMATE  --   --  163 PLATELET CLUMPS NOTED ON SMEAR, UNABLE  TO ESTIMATE PLATELET CLUMPS NOTED ON SMEAR, UNABLE TO ESTIMATE    Basic Metabolic Panel: Recent Labs  Lab 03/25/22 1733 03/25/22 1824 03/26/22 0548 03/27/22 0030 03/28/22 0120 03/28/22 1104 03/29/22 0357 03/30/22 0016  NA 144   < > 144 146*  --  139 139 140  K 4.2   < > 4.0 4.9  --  3.8 4.6 4.3  CL 104  --  105 105  --  102 99 100  CO2 28  --  32 25  --  29 30 33*  GLUCOSE 84  --  180* 137*  --  150* 101* 112*  BUN 24*  --  26* 33*  --  34* 37* 39*  CREATININE 1.16*  --  1.21* 1.46*  --  1.21* 1.10* 1.25*  CALCIUM 8.2*  --  8.1* 8.5*  --  7.8* 8.5* 8.5*  MG 1.9  --  1.9   --  1.9  --   --   --   PHOS  --   --   --   --  2.8  --   --   --    < > = values in this interval not displayed.   GFR: Estimated Creatinine Clearance: 42.6 mL/min (A) (by C-G formula based on SCr of 1.25 mg/dL (H)). Recent Labs  Lab 03/25/22 1733 03/25/22 1741 03/25/22 2039 03/26/22 0548 03/27/22 0030 03/28/22 0120 03/29/22 0357  WBC 19.2*  --   --  15.2* 15.0*  --  10.9*  LATICACIDVEN  --  2.9* 2.0* 2.1*  --  2.1*  --     Liver Function Tests: Recent Labs  Lab 03/25/22 1733 03/26/22 0548 03/27/22 0030 03/30/22 0016  AST 61* 44* 49* 42*  ALT 47* 42 44 29  ALKPHOS 125 114 133* 138*  BILITOT 1.5* 1.0 0.4 0.5  PROT 6.6 5.9* 5.8* 6.2*  ALBUMIN 2.9* 2.7* 2.5* 2.7*   No results for input(s): "LIPASE", "AMYLASE" in the last 168 hours. Recent Labs  Lab 03/26/22 0548  AMMONIA 48*    ABG    Component Value Date/Time   PHART 7.37 03/30/2022 1233   PCO2ART 61 (H) 03/30/2022 1233   PO2ART 48 (L) 03/30/2022 1233   HCO3 35.0 (H) 03/30/2022 1233   TCO2 36 (H) 03/25/2022 1858   ACIDBASEDEF 1.0 03/13/2022 0010   O2SAT 77.7 03/30/2022 1233     Coagulation Profile: No results for input(s): "INR", "PROTIME" in the last 168 hours.  Cardiac Enzymes: No results for input(s): "CKTOTAL", "CKMB", "CKMBINDEX", "TROPONINI" in the last 168 hours.  HbA1C: Hgb A1c MFr Bld  Date/Time Value Ref Range Status  11/16/2021 03:41 AM 6.0 (H) 4.8 - 5.6 % Final    Comment:    (NOTE) Pre diabetes:          5.7%-6.4%  Diabetes:              >6.4%  Glycemic control for   <7.0% adults with diabetes     CBG: No results for input(s): "GLUCAP" in the last 168 hours.  Review of Systems:   Unable to obtain  Past Medical History:  She,  has a past medical history of Arthritis, Bipolar affective disorder (HCC), and Cataract.   Surgical History:   Past Surgical History:  Procedure Laterality Date   ABDOMINAL HYSTERECTOMY       Social History:   reports that she has been smoking  cigarettes. She has a 16.25 pack-year smoking history. She has never used smokeless tobacco. She  reports current alcohol use. She reports current drug use. Drugs: Cocaine and Marijuana.   Family History:  Her family history includes Stroke in her father. There is no history of Colon cancer or Heart disease.   Allergies No Known Allergies   Home Medications  Prior to Admission medications   Medication Sig Start Date End Date Taking? Authorizing Provider  atorvastatin (LIPITOR) 20 MG tablet Take 1 tablet (20 mg total) by mouth at bedtime. 11/18/21 03/27/22 Yes Arrien, York Ram, MD  calcitRIOL (ROCALTROL) 0.25 MCG capsule Take 0.25 mcg by mouth daily. 01/13/22  Yes [provider]  carvedilol (COREG) 3.125 MG tablet Take 1 tablet (3.125 mg total) by mouth 2 (two) times daily with a meal. 11/18/21 03/27/22 Yes Arrien, York Ram, MD  FARXIGA 10 MG TABS tablet Take 10 mg by mouth every morning. 01/12/22  Yes [provider]  furosemide (LASIX) 40 MG tablet Take 40 mg by mouth daily. 03/05/22  Yes [provider]  midodrine (PROAMATINE) 10 MG tablet Take 1 tablet (10 mg total) by mouth 2 (two) times daily with a meal. 03/20/22  Yes Zannie Cove, MD  Multiple Vitamin (MULTIVITAMIN WITH MINERALS) TABS tablet Take 1 tablet by mouth daily. 12/05/21  Yes Reed, Tiffany L, DO  potassium chloride (KLOR-CON) 10 MEQ tablet Take 10 mEq by mouth daily. 03/05/22  Yes [provider]  ivabradine (CORLANOR) 5 MG TABS tablet Take 1 tablet (5 mg total) by mouth 2 (two) times daily with a meal. Patient not taking: Reported on 03/27/2022 11/18/21   Arrien, York Ram, MD     Signature:  Coralyn Helling, MD [facility] Pulmonary/Critical Care Pager - [phone] 03/30/2022, 5:25 PM

## 2022-03-31 DIAGNOSIS — J9621 Acute and chronic respiratory failure with hypoxia: Secondary | ICD-10-CM | POA: Diagnosis not present

## 2022-03-31 DIAGNOSIS — J9622 Acute and chronic respiratory failure with hypercapnia: Secondary | ICD-10-CM | POA: Diagnosis not present

## 2022-03-31 LAB — COMPREHENSIVE METABOLIC PANEL
ALT: 26 U/L (ref 0–44)
AST: 36 U/L (ref 15–41)
Albumin: 2.7 g/dL — ABNORMAL LOW (ref 3.5–5.0)
Alkaline Phosphatase: 108 U/L (ref 38–126)
Anion gap: 16 — ABNORMAL HIGH (ref 5–15)
BUN: 37 mg/dL — ABNORMAL HIGH (ref 8–23)
CO2: 24 mmol/L (ref 22–32)
Calcium: 8.4 mg/dL — ABNORMAL LOW (ref 8.9–10.3)
Chloride: 104 mmol/L (ref 98–111)
Creatinine, Ser: 1.05 mg/dL — ABNORMAL HIGH (ref 0.44–1.00)
GFR, Estimated: 56 mL/min — ABNORMAL LOW (ref >60)
Glucose, Bld: 102 mg/dL — ABNORMAL HIGH (ref 70–99)
Potassium: 5.4 mmol/L — ABNORMAL HIGH (ref 3.5–5.1)
Sodium: 144 mmol/L (ref 135–145)
Total Bilirubin: 1.3 mg/dL — ABNORMAL HIGH (ref 0.3–1.2)
Total Protein: 6 g/dL — ABNORMAL LOW (ref 6.5–8.1)

## 2022-03-31 LAB — GLUCOSE, CAPILLARY
Glucose-Capillary: 55 mg/dL — ABNORMAL LOW (ref 70–99)
Glucose-Capillary: 78 mg/dL (ref 70–99)
Glucose-Capillary: 65 mg/dL — ABNORMAL LOW (ref 70–99)
Glucose-Capillary: 94 mg/dL (ref 70–99)
Glucose-Capillary: 94 mg/dL (ref 70–99)
Glucose-Capillary: 92 mg/dL (ref 70–99)

## 2022-03-31 LAB — CULTURE, BLOOD (ROUTINE X 2)

## 2022-03-31 LAB — BLOOD GAS, ARTERIAL
Acid-Base Excess: 14.3 mmol/L — ABNORMAL HIGH (ref 0.0–2.0)
Bicarbonate: 39.8 mmol/L — ABNORMAL HIGH (ref 20.0–28.0)
Drawn by: 137461
O2 Saturation: 99.9 %
Patient temperature: 37
pCO2 arterial: 51 mmHg — ABNORMAL HIGH (ref 32–48)
pH, Arterial: 7.5 — ABNORMAL HIGH (ref 7.35–7.45)
pO2, Arterial: 131 mmHg — ABNORMAL HIGH (ref 83–108)

## 2022-03-31 MED ORDER — METHYLPREDNISOLONE SODIUM SUCC 40 MG IJ SOLR
40.0000 mg | INTRAMUSCULAR | Status: DC
  Administered 2022-03-31 – 2022-04-01 (×2): 40 mg via INTRAVENOUS
  Filled 2022-03-31 (×2): qty 1

## 2022-03-31 MED ORDER — METOPROLOL TARTRATE 5 MG/5ML IV SOLN
2.5000 mg | Freq: Four times a day (QID) | INTRAVENOUS | Status: DC
  Administered 2022-03-31 – 2022-04-04 (×12): 2.5 mg via INTRAVENOUS
  Filled 2022-03-31 (×12): qty 5

## 2022-03-31 MED ORDER — DEXTROSE 50 % IV SOLN
12.5000 g | INTRAVENOUS | Status: AC
  Administered 2022-03-31: 12.5 g via INTRAVENOUS

## 2022-03-31 MED ORDER — DEXTROSE 5 % IV SOLN: INTRAVENOUS | Status: DC

## 2022-03-31 MED ORDER — LEVOFLOXACIN IN D5W 500 MG/100ML IV SOLN
500.0000 mg | INTRAVENOUS | Status: AC
  Administered 2022-03-31 – 2022-04-03 (×4): 500 mg via INTRAVENOUS
  Filled 2022-03-31 (×4): qty 100

## 2022-03-31 MED ORDER — DEXTROSE 50 % IV SOLN: 25.0000 g | Freq: Once | INTRAVENOUS | Status: AC

## 2022-03-31 MED ORDER — PHENOBARBITAL SODIUM 65 MG/ML IJ SOLN
65.0000 mg | Freq: Every day | INTRAMUSCULAR | Status: DC
  Administered 2022-04-02: 65 mg via INTRAVENOUS
  Filled 2022-03-31: qty 1

## 2022-03-31 MED ORDER — FUROSEMIDE 10 MG/ML IJ SOLN
20.0000 mg | Freq: Every day | INTRAMUSCULAR | Status: DC
  Administered 2022-03-31 – 2022-04-01 (×2): 20 mg via INTRAVENOUS
  Filled 2022-03-31 (×2): qty 2

## 2022-03-31 MED ORDER — DEXTROSE 50 % IV SOLN
INTRAVENOUS | Status: AC
  Filled 2022-03-31: qty 50

## 2022-03-31 MED ORDER — PHENOBARBITAL SODIUM 65 MG/ML IJ SOLN
65.0000 mg | Freq: Two times a day (BID) | INTRAMUSCULAR | Status: AC
  Administered 2022-03-31 – 2022-04-01 (×4): 65 mg via INTRAVENOUS
  Filled 2022-03-31 (×4): qty 1

## 2022-03-31 NOTE — Plan of Care (Signed)
Attending physician Dr. Verlon Au reached out requesting assistance for possible cardiac amyloidosis workup given the recent echo results.   Recommended starting w/ myeloma panel, light chains, and UPEP/UIFE ect.   If and when she follows up as outpatient additional testing can be considered.   Would address the acute problem list - bactermia, hypoxia / hypercapnia leading to encephalopathy, cellulitis / weeping wounds, cocaine abuse, etc.   Rex Kras, DO, [facility]  Pager: [phone] Office: [phone]

## 2022-03-31 NOTE — Progress Notes (Addendum)
PROGRESS NOTE   Valerie Rodgers  SMO:[id number] DOB: Jul 20, 1950 DOA: 03/25/2022 PCP: Pcp, No  Brief Narrative:  71 year old black female boardinghouse resident Known underlying COPD and chronic hypoxic respiratory failure Chronic combined systolic and diastolic heart failure, EF less than 20% CKD 3B Probable underlying cirrhosis of liver Negative Lexiscan 07/23/2021 fixed anterior moderate inferolateral defects EF at the time 21% Substance abuse disorder-cocaine-chronic continued smoker Chronic lower extremity wounds since March 2023 Atrial fibrillation not on anticoagulation CHADVASC >4  Recent admission 9/24 through 03/20/2022 with edema weakness and had acute on chronic systolic dysfunction-she was diuresed 7 L given midodrine started meds and declined cessation of cocaine  Readmitted 03/25/2022 with toxic metabolic encephalopathy new tremor leukocytosis of 19 troponin 272 BNP 1853--- it was felt that she was hypercarbic on admission and started on BiPAP Ammonia was slightly elevated at 48 + group A strep thought to be associated with lower extremity cellulitis and wound infection-ID was consulted 10/12 experience agitated delirium-unclear etiology placed on CIWA protocol for suspected withdrawal  Hospital-Problem based course  Toxic metabolic encephalopathy--Suspected withdrawal, possible infectious , possible hypercarbia Appreciate critical care expertise CIWA ranging anywhere from 5-25, note ABG from yesterday CO2 60 Continues on lorazepam CIWA protocol Critical care has added phenobarbital twice daily for 2 days and then tapering doses-hopeful to avoid escalation and Precedex-Will require intensive monitoring  Sepsis on admission secondary to GaS bacteremia-on cultures Aerococcus viridans, Acinetobacter additionally ID recs Levaquin to complete abx on 10/16 [10 days]-convert to IV Holding all opiates at this time given her confusion  COPD on home oxygen Prednisone Changed back to  Solu-Medrol 40 qd on 10/13 continue albuterol every 4 as needed  Elevated troponin on admission Not characterized but stress test from earlier this year showed no acute defect with negative Lexiscan Echo this admission EF 25-30% global hypokinesis?  Infiltrative cardiomyopathy- d/w Dr. Terri Skains 10/12-work-up here- await Upep and Spep --get outpatient cardiac MRI--their office will follow as OP  Systolic diastolic heart failure with grade 3 diastolic dysfunction Plus 733, keep net negative To hold as n.p.o. at this time Corlanor 5 twice daily, Coreg 3.125 twice , midodrine 5 3 times daily --- Lasix 40 po--> IV 20 daily  Cardiorenal syndrome, CKD 3 Caution with diuresis-creatinine at baseline about 1.2  Chronic venous congestion liver Slightly elevated alk phos LFTs slightly up-monitor trends-ultrasound this admission showed cholelithiasis without cholecystitis Hepatitis panels are negative, RPR negative   DVT prophylaxis: scd Code Status: DNR Family Communication: none Disposition:  Status is: Inpatient Remains inpatient appropriate because:   Delirious not ready for discharge   Consultants:  Infectious disease  Procedures: Echo  Antimicrobials: Unasyn--> Levaquin   Subjective:  Cannot obtain history patient is fast asleep on BiPAP and received meds recently Apparently was agitated earlier today  Objective: Vitals:   03/30/22 2300 03/31/22 0306 03/31/22 0825 03/31/22 0838  BP: 111/73 111/74  110/79  Pulse: 67 63 68 63  Resp: (!) 23 20 (!) 23 20  Temp: 97.9 F (36.6 C) 97.8 F (36.6 C)  98 F (36.7 C)  TempSrc: Axillary Axillary  Axillary  SpO2: 95% 96% 97% 97%  Weight:  83.7 kg    Height:        Intake/Output Summary (Last 24 hours) at 03/31/2022 1054 Last data filed at 03/31/2022 0308 Gross per 24 hour  Intake --  Output 100 ml  Net -100 ml    Filed Weights   03/28/22 0330 03/30/22 0301 03/31/22 0306  Weight: 83.3 kg 83.7  kg 83.7 kg     Examination:  Fast asleep at the bedside mittens are in place Chest is clear no added sound wheeze rales rhonchi Abdomen is obese nontender Lower extremities have bilateral wrappings around them I did not examine wounds today Neurologically moving all 4 limbs without deficit  Data Reviewed: personally reviewed   CBC    Component Value Date/Time   WBC 10.9 (H) 03/29/2022 0357   RBC 4.38 03/29/2022 0357   HGB 11.6 (L) 03/29/2022 0357   HCT 37.6 03/29/2022 0357   PLT PLATELET CLUMPS NOTED ON SMEAR, UNABLE TO ESTIMATE 03/29/2022 0357   MCV 85.8 03/29/2022 0357   MCH 26.5 03/29/2022 0357   MCHC 30.9 03/29/2022 0357   RDW 18.7 (H) 03/29/2022 0357   LYMPHSABS 0.6 (L) 03/29/2022 0357   MONOABS 0.5 03/29/2022 0357   EOSABS 0.0 03/29/2022 0357   BASOSABS 0.0 03/29/2022 0357      Latest Ref Rng & Units 03/31/2022    1:04 AM 03/30/2022   12:16 AM 03/29/2022    3:57 AM  CMP  Glucose 70 - 99 mg/dL 102  112  101   BUN 8 - 23 mg/dL 37  39  37   Creatinine 0.44 - 1.00 mg/dL 1.05  1.25  1.10   Sodium 135 - 145 mmol/L 144  140  139   Potassium 3.5 - 5.1 mmol/L 5.4  4.3  4.6   Chloride 98 - 111 mmol/L 104  100  99   CO2 22 - 32 mmol/L 24  33  30   Calcium 8.9 - 10.3 mg/dL 8.4  8.5  8.5   Total Protein 6.5 - 8.1 g/dL 6.0  6.2    Total Bilirubin 0.3 - 1.2 mg/dL 1.3  0.5    Alkaline Phos 38 - 126 U/L 108  138    AST 15 - 41 U/L 36  42    ALT 0 - 44 U/L 26  29       Radiology Studies: No results found.   Scheduled Meds:  atorvastatin  20 mg Oral QHS   carvedilol  3.125 mg Oral BID WC   ivabradine  5 mg Oral BID WC   levofloxacin  500 mg Oral Daily   LORazepam  0-4 mg Intravenous Q4H   Followed by   Derrill Memo ON 04/01/2022] LORazepam  0-4 mg Intravenous Q8H   PHENObarbital  65 mg Intravenous BID   Followed by   Derrill Memo ON 04/02/2022] PHENObarbital  65 mg Intravenous QHS   predniSONE  60 mg Oral QAC breakfast   sodium chloride flush  3 mL Intravenous Q12H   Continuous  Infusions:     LOS: 6 days   Time spent: 40  Nita Sells, MD [facility] To contact the attending provider between 7A-7P or the covering provider during after hours 7P-7A, please log into the web site [email or web address] and access using universal [place] password for that web site. If you do not have the password, please call the hospital operator.  03/31/2022, 10:54 AM

## 2022-03-31 NOTE — Inpatient Diabetes Management (Signed)
Inpatient Diabetes Program Recommendations  AACE/ADA: New Consensus Statement on Inpatient Glycemic Control (2015)  Target Ranges:  Prepandial:   less than 140 mg/dL      Peak postprandial:   less than 180 mg/dL (1-2 hours)      Critically ill patients:  140 - 180 mg/dL   Lab Results  Component Value Date   GLUCAP 55 (L) 03/31/2022   HGBA1C 6.0 (H) 11/16/2021    Review of Glycemic Control  Latest Reference Range & Units 03/31/22 12:30  Glucose-Capillary 70 - 99 mg/dL 55 (L)  (L): Data is abnormally low Diabetes history: Type 2 DM Outpatient Diabetes medications: Farxiga 10 mg QD Current orders for Inpatient glycemic control: none Solumedrol 40 mg QD  Inpatient Diabetes Program Recommendations:   Noted hypoglycemia of 55 mg/dL.  Consider adding CBGs TID & HS.   Thanks, Bronson Curb, MSN, RNC-OB Diabetes Coordinator [phone] (8a-5p)

## 2022-03-31 NOTE — Progress Notes (Signed)
PT Cancellation Note  Patient Details Name: Valerie Rodgers MRN: [id number] DOB: 1950/08/26   Cancelled Treatment:    Reason Eval/Treat Not Completed: (P) Medical issues which prohibited therapy (pt not appropriate for PT session at this time given change in arousal/alertness as advised by RN and pt on BiPAP.) Will check back as schedule allows to continue with PT POC.  Audry Riles. PTA Acute Rehabilitation Services Office: [place] 03/31/2022, 1:17 PM

## 2022-03-31 NOTE — Progress Notes (Signed)
Mobility Specialist Progress Note    03/31/22 1520  Mobility  Activity Contraindicated/medical hold   Pt on BiPAP and w/ change in alertness/arousal. Will f/u as appropriate.   Hildred Alamin Mobility Specialist  Secure Chat Only

## 2022-03-31 NOTE — Progress Notes (Signed)
Pt alert, yelling, pulling at BiPAP, trying to swing legs out of bed.  Pt confused to place, time, situation.  Ativan 3mg  IV given.  BiPAP replaced.  Will continue to monitor CIWA.

## 2022-03-31 NOTE — Significant Event (Addendum)
Rapid Response Rounding Note   Reason for Call :  While rounding on pt, she is noted to only respond to pain. Currently on BiPAP. SpO2 not reading, probe replaced.   Initial Focused Assessment:  Pt lying in bed, responds to pain. Disoriented. Pt currently on Phenobarbital 65mg  IV BID. She last received Ativan 3mg  IV at [phone] and 4mg  IV at 0726.  Lung sounds are clear. Breathing is unlabored. Pt pulling 400s TV on BiPAP 14/4, 30%.  Skin is cool, pink, dry.  Pt noted to be moving all extremities.  CBG checked: 55 - hypoglycemic protocol followed Recheck CBG: 78  VS: T 97.42F, BP 119/73, HR 64, RR 18, SpO2 94% on BiPAP  Interventions:  -CBG  Plan of Care:  -Close monitoring of mentation -Consider repeat ABG if pt mentation does not improve -CBG monitoring while pt is requiring BiPAP, having poor oral intake  Call rapid response for additional needs  Event Summary:  MD Notified: Dr. Verlon Au, per RN Arrival Time: [id number] End Time: [place], RN

## 2022-03-31 NOTE — Progress Notes (Signed)
   NAME:  Valerie Rodgers, MRN:  [id number], DOB:  1951/05/19, LOS: 6 ADMISSION DATE:  03/25/2022, CONSULTATION DATE:  03/30/2022 REFERRING MD:  Dr. Verlon Au, [facility], CHIEF COMPLAINT:  AMS   History of Present Illness:  70 yo female smoker brought to ER with tremors, leg sweeping with weeping wounds.  She was admitted by hospitalist for COPD exacerbation and cellulitis of her legs.  UDS positive for cocaine.  Found to have bacteremia and ID consulted.  She developed worsening agitation on 10/12 with concern for withdrawal.  PCCM consulted to assist with management.  Pertinent  Medical History  Arthritis, Bipolar, Cataract, Cocaine and Marijuana abuse, HLD, Diastolic/Systolic CHF, COPD on home oxygen, CKD 3b, A fib  Significant Hospital Events: Including procedures, antibiotic start and stop dates in addition to other pertinent events   10/07 Admit 10/08 ID consulted 10/09 transient hypotension 10/12 agitated delirium, started on Bipap  Studies:  Abd u/s 03/25/22 >> GB thickening Echo 03/26/22 >> EF 20 to 25%, mod elevation in PASP, mod LA/RA dilation, mod MR, mod TR, mod PR  Interim History / Subjective:  Got worked up this am and required ativan.  Objective   Blood pressure 110/79, pulse 63, temperature 98 F (36.7 C), temperature source Axillary, resp. rate 20, height 5\' 4"  (1.626 m), weight 83.7 kg, SpO2 97 %.    FiO2 (%):  [30 %] 30 %   Intake/Output Summary (Last 24 hours) at 03/31/2022 0941 Last data filed at 03/31/2022 0308 Gross per 24 hour  Intake --  Output 100 ml  Net -100 ml    Filed Weights   03/28/22 0330 03/30/22 0301 03/31/22 0306  Weight: 83.3 kg 83.7 kg 83.7 kg    Examination: Resting comfortably on BIPAP Lungs clear Ext no edema Withdraws x 4 Agitated with stimulation  BMP looks okay  Resolved Hospital Problem list     Assessment & Plan:   Agitated delirium with concern for withdrawal.  Concurrent septic encephalopathy also possible. - admission  UDS positive for cocaine and reported hx of ETOH - CIWA with prn ativan - Will add standing phenobarb taper, can use ativan driven by CIWA score on top of this - don't think she needs precedex at this time  Chronic hypoxic/hypercapnic respiratory failure. - from COPD and possible sleep disordered breathing - DNR/DNI - Bipap as needed  - goal SpO2 90 to 95%  Hx of COPD. - prn duoneb  Lower legs cellulitis with bacteremia. - Abx per primary team  Chronic diastolic/systolic CHF. Elevated troponin from demand ischemia related to cocaine abuse. - monitor hemodynamics  CKD 3b. - f/u BMET   Discussed with primary, given stability will move to a PRN basis, should she end up not responding to the phenobarb+ativan PRN combo please reach back out.   Erskine Emery MD PCCM

## 2022-03-31 NOTE — Plan of Care (Signed)
  Problem: Clinical Measurements: Goal: Ability to maintain clinical measurements within normal limits will improve Outcome: Progressing Goal: Will remain free from infection Outcome: Progressing Goal: Respiratory complications will improve Outcome: Progressing Goal: Cardiovascular complication will be avoided Outcome: Progressing   

## 2022-03-31 NOTE — Plan of Care (Signed)
  Problem: Education: Goal: Knowledge of General Education information will improve Description Including pain rating scale, medication(s)/side effects and non-pharmacologic comfort measures Outcome: Progressing   Problem: Health Behavior/Discharge Planning: Goal: Ability to manage health-related needs will improve Outcome: Progressing   

## 2022-04-01 ENCOUNTER — Inpatient Hospital Stay (HOSPITAL_COMMUNITY): Payer: Medicare Other

## 2022-04-01 DIAGNOSIS — J9621 Acute and chronic respiratory failure with hypoxia: Secondary | ICD-10-CM | POA: Diagnosis not present

## 2022-04-01 DIAGNOSIS — J9622 Acute and chronic respiratory failure with hypercapnia: Secondary | ICD-10-CM | POA: Diagnosis not present

## 2022-04-01 LAB — COMPREHENSIVE METABOLIC PANEL
ALT: 23 U/L (ref 0–44)
AST: 28 U/L (ref 15–41)
Albumin: 2.4 g/dL — ABNORMAL LOW (ref 3.5–5.0)
Alkaline Phosphatase: 84 U/L (ref 38–126)
Anion gap: 6 (ref 5–15)
BUN: 34 mg/dL — ABNORMAL HIGH (ref 8–23)
CO2: 34 mmol/L — ABNORMAL HIGH (ref 22–32)
Calcium: 8.4 mg/dL — ABNORMAL LOW (ref 8.9–10.3)
Chloride: 104 mmol/L (ref 98–111)
Creatinine, Ser: 1.16 mg/dL — ABNORMAL HIGH (ref 0.44–1.00)
GFR, Estimated: 50 mL/min — ABNORMAL LOW (ref >60)
Glucose, Bld: 129 mg/dL — ABNORMAL HIGH (ref 70–99)
Potassium: 4.6 mmol/L (ref 3.5–5.1)
Sodium: 144 mmol/L (ref 135–145)
Total Bilirubin: 0.8 mg/dL (ref 0.3–1.2)
Total Protein: 5 g/dL — ABNORMAL LOW (ref 6.5–8.1)

## 2022-04-01 LAB — GLUCOSE, CAPILLARY
Glucose-Capillary: 98 mg/dL (ref 70–99)
Glucose-Capillary: 106 mg/dL — ABNORMAL HIGH (ref 70–99)
Glucose-Capillary: 98 mg/dL (ref 70–99)
Glucose-Capillary: 100 mg/dL — ABNORMAL HIGH (ref 70–99)

## 2022-04-01 LAB — CBC WITH DIFFERENTIAL/PLATELET
Abs Immature Granulocytes: 0.06 10*3/uL (ref 0.00–0.07)
Basophils Absolute: 0 10*3/uL (ref 0.0–0.1)
Basophils Relative: 0 %
Eosinophils Absolute: 0 10*3/uL (ref 0.0–0.5)
Eosinophils Relative: 0 %
HCT: 36.9 % (ref 36.0–46.0)
Hemoglobin: 11.8 g/dL — ABNORMAL LOW (ref 12.0–15.0)
Immature Granulocytes: 1 %
Lymphocytes Relative: 4 %
Lymphs Abs: 0.3 10*3/uL — ABNORMAL LOW (ref 0.7–4.0)
MCH: 26.5 pg (ref 26.0–34.0)
MCHC: 32 g/dL (ref 30.0–36.0)
MCV: 82.9 fL (ref 80.0–100.0)
Monocytes Absolute: 0.4 10*3/uL (ref 0.1–1.0)
Monocytes Relative: 5 %
Neutro Abs: 7.5 10*3/uL (ref 1.7–7.7)
Neutrophils Relative %: 90 %
Platelets: 137 10*3/uL — ABNORMAL LOW (ref 150–400)
RBC: 4.45 MIL/uL (ref 3.87–5.11)
RDW: 18.5 % — ABNORMAL HIGH (ref 11.5–15.5)
WBC: 8.4 10*3/uL (ref 4.0–10.5)
nRBC: 0 % (ref 0.0–0.2)

## 2022-04-01 LAB — CULTURE, BLOOD (ROUTINE X 2)
Culture: NO GROWTH
Culture: NO GROWTH
Special Requests: ADEQUATE

## 2022-04-01 NOTE — Progress Notes (Signed)
EEG complete - results pending 

## 2022-04-01 NOTE — Plan of Care (Signed)
  Problem: Clinical Measurements: Goal: Cardiovascular complication will be avoided Outcome: Progressing   Problem: Coping: Goal: Level of anxiety will decrease Outcome: Progressing   Problem: Elimination: Goal: Will not experience complications related to bowel motility Outcome: Progressing Goal: Will not experience complications related to urinary retention Outcome: Progressing   

## 2022-04-01 NOTE — Progress Notes (Addendum)
PROGRESS NOTE   Valerie Rodgers  OGA:[id number] DOB: 09-Nov-1950 DOA: 03/25/2022 PCP: Pcp, No  Brief Narrative:  71 year old black female boardinghouse resident Known underlying COPD and chronic hypoxic respiratory failure Chronic combined systolic and diastolic heart failure, EF less than 20% CKD 3B Probable underlying cirrhosis of liver Negative Lexiscan 07/23/2021 fixed anterior moderate inferolateral defects EF at the time 21% Substance abuse disorder-cocaine-chronic continued smoker Chronic lower extremity wounds since March 2023 Atrial fibrillation not on anticoagulation CHADVASC >4  Recent admission 9/24 through 03/20/2022 with edema weakness and had acute on chronic systolic dysfunction-she was diuresed 7 L given midodrine started meds and declined cessation of cocaine  Readmitted 03/25/2022 with toxic metabolic encephalopathy new tremor leukocytosis of 19 troponin 272 BNP 1853--- it was felt that she was hypercarbic on admission and started on BiPAP Ammonia was slightly elevated at 48 + group A strep thought to be associated with lower extremity cellulitis and wound infection-ID was consulted 10/12 experience agitated delirium-unclear etiology placed on CIWA protocol for suspected withdrawal  Hospital-Problem based course  Toxic metabolic encephalopathy--Suspected withdrawal, possible infectious , possible hypercarbia CIWA ranging anywhere from 13-25, agitated significantly last pm Has only been given phenobarb today no ativan Continues on lorazepam CIWA protocol/phenobarbital twice daily for 2 days and then tapering doses- Get non emergent EEG to complete work up of acute altered mental status As non rousable, is on d5w 75cc/h  Sepsis on admission secondary to GaS bacteremia-on cultures Aerococcus viridans, Acinetobacter additionally ID recs Levaquin to complete abx on 10/16 [10 days]-convert to IV Holding all opiates at this time given her confusion  COPD on home  oxygen Prednisone Changed back to Solu-Medrol 40 qd on 10/13 continue albuterol every 4 as needed  Elevated troponin on admission stress test from earlier this year showed no acute defect with negative Lexiscan Echo this admission EF 25-30% global hypokinesis?  Infiltrative cardiomyopathy- d/w Dr. Odis Hollingshead 10/12-work-up here- await Upep and Spep --get outpatient cardiac MRI--their office will follow as OP  Systolic diastolic heart failure with grade 3 diastolic dysfunction -1.092 To hold as n.p.o. at this time Corlanor 5 twice daily, Coreg 3.125 twice , midodrine 5 3 times daily --- Lasix held as remians NPO Labs in am  Cardiorenal syndrome, CKD 3 Caution with diuresis-creatinine at baseline about 1.2  Chronic venous congestion liver Slightly elevated alk phos LFTs slightly up-monitor trends-ultrasound this admission showed cholelithiasis without cholecystitis Hepatitis panels are negative, RPR negative   DVT prophylaxis: scd Code Status: DNR Family Communication: none Disposition:  Status is: Inpatient Remains inpatient appropriate because:   Delirious not ready for discharge   Consultants:  Infectious disease  Procedures: Echo  Antimicrobials: Unasyn--> Levaquin   Subjective:  Cannot obtain history --aropuses with me touching legs but then falls back to sleep Not appropriae a this time for PO meds  Objective: Vitals:   04/01/22 0500 04/01/22 0724 04/01/22 0742 04/01/22 1132  BP:  113/71  113/69  Pulse:  (!) 56 (!) 56 (!) 54  Resp:  15 18 17   Temp:  97.8 F (36.6 C)  97.9 F (36.6 C)  TempSrc:  Axillary  Axillary  SpO2:  94% 94% 92%  Weight: 83.8 kg     Height:        Intake/Output Summary (Last 24 hours) at 04/01/2022 1436 Last data filed at 04/01/2022 1134 Gross per 24 hour  Intake 973.7 ml  Output 2800 ml  Net -1826.3 ml    Filed Weights   03/30/22 0301 03/31/22 0306  04/01/22 0500  Weight: 83.7 kg 83.7 kg 83.8 kg    Examination:  Fast  asleep at the bedside mittens are in place--arouses when I press her legs Chest is clear n--poor exam as is on Bipap Abdomen is obese nontender Lower extremities have bilateral wrappings  Neurologically moving all 4 limbs without deficit  Data Reviewed: personally reviewed   CBC    Component Value Date/Time   WBC 10.9 (H) 03/29/2022 0357   RBC 4.38 03/29/2022 0357   HGB 11.6 (L) 03/29/2022 0357   HCT 37.6 03/29/2022 0357   PLT PLATELET CLUMPS NOTED ON SMEAR, UNABLE TO ESTIMATE 03/29/2022 0357   MCV 85.8 03/29/2022 0357   MCH 26.5 03/29/2022 0357   MCHC 30.9 03/29/2022 0357   RDW 18.7 (H) 03/29/2022 0357   LYMPHSABS 0.6 (L) 03/29/2022 0357   MONOABS 0.5 03/29/2022 0357   EOSABS 0.0 03/29/2022 0357   BASOSABS 0.0 03/29/2022 0357      Latest Ref Rng & Units 04/01/2022   12:20 AM 03/31/2022    1:04 AM 03/30/2022   12:16 AM  CMP  Glucose 70 - 99 mg/dL 129  102  112   BUN 8 - 23 mg/dL 34  37  39   Creatinine 0.44 - 1.00 mg/dL 1.16  1.05  1.25   Sodium 135 - 145 mmol/L 144  144  140   Potassium 3.5 - 5.1 mmol/L 4.6  5.4  4.3   Chloride 98 - 111 mmol/L 104  104  100   CO2 22 - 32 mmol/L 34  24  33   Calcium 8.9 - 10.3 mg/dL 8.4  8.4  8.5   Total Protein 6.5 - 8.1 g/dL 5.0  6.0  6.2   Total Bilirubin 0.3 - 1.2 mg/dL 0.8  1.3  0.5   Alkaline Phos 38 - 126 U/L 84  108  138   AST 15 - 41 U/L 28  36  42   ALT 0 - 44 U/L $Remo'23  26  29      'xoIRQ$ Radiology Studies: No results found.   Scheduled Meds:  furosemide  20 mg Intravenous Daily   LORazepam  0-4 mg Intravenous Q4H   Followed by   LORazepam  0-4 mg Intravenous Q8H   methylPREDNISolone (SOLU-MEDROL) injection  40 mg Intravenous Q24H   metoprolol tartrate  2.5 mg Intravenous Q6H   PHENObarbital  65 mg Intravenous BID   Followed by   Derrill Memo ON 04/02/2022] PHENObarbital  65 mg Intravenous QHS   sodium chloride flush  3 mL Intravenous Q12H   Continuous Infusions:  dextrose 75 mL/hr at 04/01/22 1230   levofloxacin (LEVAQUIN)  IV 500 mg (04/01/22 1231)      LOS: 7 days   Time spent: 40  Nita Sells, MD [facility] To contact the attending provider between 7A-7P or the covering provider during after hours 7P-7A, please log into the web site [email or web address] and access using universal [place] password for that web site. If you do not have the password, please call the hospital operator.  04/01/2022, 2:36 PM

## 2022-04-01 NOTE — Plan of Care (Signed)
  Problem: Clinical Measurements: Goal: Cardiovascular complication will be avoided 04/01/2022 0616 by Olinda Nola, Kristopher Glee, RN Outcome: Progressing 04/01/2022 0551 by Roslyn Smiling, RN Outcome: Progressing   Problem: Coping: Goal: Level of anxiety will decrease 04/01/2022 0616 by Roslyn Smiling, RN Outcome: Progressing 04/01/2022 0551 by Roslyn Smiling, RN Outcome: Progressing   Problem: Elimination: Goal: Will not experience complications related to bowel motility Outcome: Progressing Goal: Will not experience complications related to urinary retention 04/01/2022 0616 by Roslyn Smiling, RN Outcome: Progressing 04/01/2022 0551 by Roslyn Smiling, RN Outcome: Progressing

## 2022-04-02 DIAGNOSIS — R4182 Altered mental status, unspecified: Secondary | ICD-10-CM | POA: Diagnosis not present

## 2022-04-02 DIAGNOSIS — J9621 Acute and chronic respiratory failure with hypoxia: Secondary | ICD-10-CM | POA: Diagnosis not present

## 2022-04-02 DIAGNOSIS — J9622 Acute and chronic respiratory failure with hypercapnia: Secondary | ICD-10-CM | POA: Diagnosis not present

## 2022-04-02 LAB — POCT I-STAT 7, (LYTES, BLD GAS, ICA,H+H)
Acid-Base Excess: 8 mmol/L — ABNORMAL HIGH (ref 0.0–2.0)
Bicarbonate: 34.5 mmol/L — ABNORMAL HIGH (ref 20.0–28.0)
Calcium, Ion: 1.23 mmol/L (ref 1.15–1.40)
HCT: 39 % (ref 36.0–46.0)
Hemoglobin: 13.3 g/dL (ref 12.0–15.0)
O2 Saturation: 96 %
Potassium: 3.9 mmol/L (ref 3.5–5.1)
Sodium: 139 mmol/L (ref 135–145)
TCO2: 36 mmol/L — ABNORMAL HIGH (ref 22–32)
pCO2 arterial: 54.1 mmHg — ABNORMAL HIGH (ref 32–48)
pH, Arterial: 7.413 (ref 7.35–7.45)
pO2, Arterial: 86 mmHg (ref 83–108)

## 2022-04-02 LAB — COMPREHENSIVE METABOLIC PANEL
ALT: 18 U/L (ref 0–44)
AST: 20 U/L (ref 15–41)
Albumin: 2.2 g/dL — ABNORMAL LOW (ref 3.5–5.0)
Alkaline Phosphatase: 83 U/L (ref 38–126)
Anion gap: 7 (ref 5–15)
BUN: 26 mg/dL — ABNORMAL HIGH (ref 8–23)
CO2: 33 mmol/L — ABNORMAL HIGH (ref 22–32)
Calcium: 8.3 mg/dL — ABNORMAL LOW (ref 8.9–10.3)
Chloride: 102 mmol/L (ref 98–111)
Creatinine, Ser: 0.96 mg/dL (ref 0.44–1.00)
GFR, Estimated: >60 mL/min (ref >60)
Glucose, Bld: 130 mg/dL — ABNORMAL HIGH (ref 70–99)
Potassium: 4.3 mmol/L (ref 3.5–5.1)
Sodium: 142 mmol/L (ref 135–145)
Total Bilirubin: 0.8 mg/dL (ref 0.3–1.2)
Total Protein: 4.7 g/dL — ABNORMAL LOW (ref 6.5–8.1)

## 2022-04-02 LAB — CBC
HCT: 35 % — ABNORMAL LOW (ref 36.0–46.0)
Hemoglobin: 11.6 g/dL — ABNORMAL LOW (ref 12.0–15.0)
MCH: 27 pg (ref 26.0–34.0)
MCHC: 33.1 g/dL (ref 30.0–36.0)
MCV: 81.6 fL (ref 80.0–100.0)
Platelets: 149 10*3/uL — ABNORMAL LOW (ref 150–400)
RBC: 4.29 MIL/uL (ref 3.87–5.11)
RDW: 18.2 % — ABNORMAL HIGH (ref 11.5–15.5)
WBC: 7.7 10*3/uL (ref 4.0–10.5)
nRBC: 0 % (ref 0.0–0.2)

## 2022-04-02 LAB — MAGNESIUM: Magnesium: 1.8 mg/dL (ref 1.7–2.4)

## 2022-04-02 NOTE — Progress Notes (Signed)
PROGRESS NOTE   Valerie Rodgers  IOM:[id number] DOB: 1951/04/20 DOA: 03/25/2022 PCP: Pcp, No  Brief Narrative:  71 year old black female boardinghouse resident Known underlying COPD and chronic hypoxic respiratory failure Chronic combined systolic and diastolic heart failure, EF less than 20% CKD 3B Probable underlying cirrhosis of liver Negative Lexiscan 07/23/2021 fixed anterior moderate inferolateral defects EF at the time 21% Substance abuse disorder-cocaine-chronic continued smoker Chronic lower extremity wounds since March 2023 Atrial fibrillation not on anticoagulation CHADVASC >4  Recent admission 9/24 through 03/20/2022 with edema weakness and had acute on chronic systolic dysfunction-she was diuresed 7 L given midodrine started meds and declined cessation of cocaine  Readmitted 03/25/2022 with toxic metabolic encephalopathy new tremor leukocytosis of 19 troponin 272 BNP 1853--- it was felt that she was hypercarbic on admission and started on BiPAP Ammonia was slightly elevated at 48 + group A strep thought to be associated with lower extremity cellulitis and wound infection-ID was consulted 10/12 experience agitated delirium-unclear etiology placed on CIWA protocol for suspected withdrawal  Hospital-Problem based course  Toxic metabolic encephalopathy--? withdrawal unknown cause? infectious-lower extremity, ?hypercarbia CIWA ranging anywhere from 15-22, Continues on lorazepam CIWA protocol/phenobarb taper initiated 10/12 and will stop 10/16 p.m. EEG 10/14 not confirmatory for seizures non rousable cannot get nutrition, is on d5w 75cc/h  Hypoxic respiratory failure hypercarbia Took off BiPAP today-not awake enough to maintain airway dropped sats to 86% Get blood gas later today for monitoring of hypercarbia expect will resolve once she is able to complete withdrawal protocol and awaken more  Sepsis on admission secondary to GaS bacteremia-on cultures Aerococcus viridans,  Acinetobacter additionally ID recs Levaquin to complete abx on 10/16 [10 days]-convert to IV Holding all opiates at this time given her confusion  COPD on home oxygen Prednisone Changed back to Solu-Medrol 40 qd on 10/13--- stopped 10/15 after 8 days therapy continue albuterol every 4 as needed  Elevated troponin on admission stress test from earlier this year showed no acute defect with negative Lexiscan Echo this admission EF 25-30% global hypokinesis?  Infiltrative cardiomyopathy- d/w Dr. Terri Skains 10/12-work-up here- await Upep and Spep --get outpatient cardiac MRI--their office will follow as OP  Systolic diastolic heart failure with grade 3 diastolic dysfunction I/O's inaccurate n.p.o. at this time Corlanor 5 twice daily, Coreg 3.125 twice , midodrine 5 3 times daily --- Lasix held as remains NPO  Cardiorenal syndrome, CKD 3 Caution with diuresis-creatinine at baseline about 1.2  Chronic venous congestion liver Slightly elevated alk phos LFTs slightly up-monitor trends-ultrasound this admission showed cholelithiasis without cholecystitis Hepatitis panels are negative, RPR negative   DVT prophylaxis: scd Code Status: DNR Family Communication: none Disposition:  Status is: Inpatient Remains inpatient appropriate because:   Delirious not ready for discharge   Consultants:  Infectious disease  Procedures: Echo  Antimicrobials: Unasyn--> Levaquin   Subjective:  Still quite lethargic and although arouses some on movement is now able to wake up-moans and groans O2 sat drops to 86% after 1 to 2 minutes of being off the oxygen and she was placed back on BiPAP She remains somewhat agitated at times  Objective: Vitals:   04/02/22 0400 04/02/22 0427 04/02/22 0500 04/02/22 0812  BP: (!) 126/98  120/80 112/72  Pulse: 64  (!) 56 (!) 52  Resp: _0 Temp: 98.3 F (36.8 C)   97.8 F (36.6 C)  TempSrc: Axillary   Axillary  SpO2: 91%  94% 94%  Weight:  82 kg     Height:  Intake/Output Summary (Last 24 hours) at 2022-05-01 0917 Last data filed at 01-May-2022 0231 Gross per 24 hour  Intake 1348.82 ml  Output 2050 ml  Net -701.18 ml    Filed Weights   03/31/22 0306 04/01/22 0500 May 01, 2022 0427  Weight: 83.7 kg 83.8 kg 82 kg    Examination:  Off BiPAP mucosa is dry-no icterus no pallor-pupils are reactive to light Chest is clear anterolaterally Abdomen is obese nontender Lower extremities have bilateral wrappings which were not examined today She is not able to meaningfully make movements but withdraws from pressure over her lower extremities  Data Reviewed: personally reviewed   CBC    Component Value Date/Time   WBC 7.7 01-May-2022 0037   RBC 4.29 01-May-2022 0037   HGB 11.6 (L) 05/01/22 0037   HCT 35.0 (L) 2022-05-01 0037   PLT 149 (L) 05-01-2022 0037   MCV 81.6 2022/05/01 0037   MCH 27.0 2022/05/01 0037   MCHC 33.1 2022/05/01 0037   RDW 18.2 (H) 01-May-2022 0037   LYMPHSABS 0.3 (L) 04/01/2022 1651   MONOABS 0.4 04/01/2022 1651   EOSABS 0.0 04/01/2022 1651   BASOSABS 0.0 04/01/2022 1651      Latest Ref Rng & Units 2022/05/01   12:37 AM 04/01/2022   12:20 AM 03/31/2022    1:04 AM  CMP  Glucose 70 - 99 mg/dL 130  129  102   BUN 8 - 23 mg/dL 26  34  37   Creatinine 0.44 - 1.00 mg/dL 0.96  1.16  1.05   Sodium 135 - 145 mmol/L 142  144  144   Potassium 3.5 - 5.1 mmol/L 4.3  4.6  5.4   Chloride 98 - 111 mmol/L 102  104  104   CO2 22 - 32 mmol/L 33  34  24   Calcium 8.9 - 10.3 mg/dL 8.3  8.4  8.4   Total Protein 6.5 - 8.1 g/dL 4.7  5.0  6.0   Total Bilirubin 0.3 - 1.2 mg/dL 0.8  0.8  1.3   Alkaline Phos 38 - 126 U/L 83  84  108   AST 15 - 41 U/L 20  28  36   ALT 0 - 44 U/L _0 Radiology Studies: EEG adult  Result Date: 05-01-2022 Lora Havens, MD     05/01/2022  8:11 AM Patient Name: Valerie Rodgers MRN: [id number] Epilepsy Attending: Lora Havens Referring Physician/Provider: Nita Sells, MD Date: 04/01/2022 Duration: 21.57 mins Patient history: 71yo F with ams. EEG to evaluate fir seizure Level of alertness: lethargic AEDs during EEG study: Phenobarb, Ativan Technical aspects: This EEG study was done with scalp electrodes positioned according to the 10-20 International system of electrode placement. Electrical activity was reviewed with band pass filter of 1-_1 , sensitivity of 7 uV/mm, display speed of 26m/sec with a _2  notched filter applied as appropriate. EEG data were recorded continuously and digitally stored.  Video monitoring was available and reviewed as appropriate. Description:  EEG showed continuous generalized 3 to 6 Hz theta-delta slowing. admixed with an excessive amount of 15 to 18 Hz beta activity distributed symmetrically and diffusely.  Hyperventilation and photic stimulation were not performed.   ABNORMALITY - Continuous slow, generalized - Excessive beta, generalized IMPRESSION: This study is suggestive of moderate diffuse encephalopathy, nonspecific etiology. The excessive beta activity seen in the background is most likely due to the effect of benzodiazepine and is a benign EEG pattern. No seizures  or epileptiform discharges were seen throughout the recording. Priyanka Barbra Sarks     Scheduled Meds:  LORazepam  0-4 mg Intravenous Q8H   metoprolol tartrate  2.5 mg Intravenous Q6H   PHENObarbital  65 mg Intravenous QHS   sodium chloride flush  3 mL Intravenous Q12H   Continuous Infusions:  dextrose 75 mL/hr at 04/02/22 0256   levofloxacin (LEVAQUIN) IV Stopped (04/01/22 1332)      LOS: 8 days   Time spent: 30  Nita Sells, MD [facility] To contact the attending provider between 7A-7P or the covering provider during after hours 7P-7A, please log into the web site [email or web address] and access using universal [place] password for that web site. If you do not have the password, please call the hospital operator.  04/02/2022, 9:17  AM

## 2022-04-02 NOTE — Procedures (Signed)
Patient Name: Valerie Rodgers  MRN: [id number]  Epilepsy Attending: Lora Havens  Referring Physician/Provider: Nita Sells, MD Date: 04/01/2022 Duration: 21.57 mins  Patient history: 71yo F with ams. EEG to evaluate fir seizure  Level of alertness: lethargic   AEDs during EEG study: Phenobarb, Ativan  Technical aspects: This EEG study was done with scalp electrodes positioned according to the 10-20 International system of electrode placement. Electrical activity was reviewed with band pass filter of 1-70Hz , sensitivity of 7 uV/mm, display speed of 51mm/sec with a 60Hz  notched filter applied as appropriate. EEG data were recorded continuously and digitally stored.  Video monitoring was available and reviewed as appropriate.  Description:  EEG showed continuous generalized 3 to 6 Hz theta-delta slowing. admixed with an excessive amount of 15 to 18 Hz beta activity distributed symmetrically and diffusely.  Hyperventilation and photic stimulation were not performed.     ABNORMALITY - Continuous slow, generalized - Excessive beta, generalized  IMPRESSION: This study is suggestive of moderate diffuse encephalopathy, nonspecific etiology. The excessive beta activity seen in the background is most likely due to the effect of benzodiazepine and is a benign EEG pattern. No seizures or epileptiform discharges were seen throughout the recording.  Sondi Desch Barbra Sarks

## 2022-04-03 DIAGNOSIS — J9622 Acute and chronic respiratory failure with hypercapnia: Secondary | ICD-10-CM | POA: Diagnosis not present

## 2022-04-03 DIAGNOSIS — J9621 Acute and chronic respiratory failure with hypoxia: Secondary | ICD-10-CM | POA: Diagnosis not present

## 2022-04-03 LAB — COMPREHENSIVE METABOLIC PANEL
ALT: 18 U/L (ref 0–44)
AST: 22 U/L (ref 15–41)
Albumin: 2.2 g/dL — ABNORMAL LOW (ref 3.5–5.0)
Alkaline Phosphatase: 79 U/L (ref 38–126)
Anion gap: 8 (ref 5–15)
BUN: 18 mg/dL (ref 8–23)
CO2: 37 mmol/L — ABNORMAL HIGH (ref 22–32)
Calcium: 8.7 mg/dL — ABNORMAL LOW (ref 8.9–10.3)
Chloride: 98 mmol/L (ref 98–111)
Creatinine, Ser: 1.01 mg/dL — ABNORMAL HIGH (ref 0.44–1.00)
GFR, Estimated: 59 mL/min — ABNORMAL LOW (ref >60)
Glucose, Bld: 82 mg/dL (ref 70–99)
Potassium: 3.8 mmol/L (ref 3.5–5.1)
Sodium: 143 mmol/L (ref 135–145)
Total Bilirubin: 1 mg/dL (ref 0.3–1.2)
Total Protein: 4.7 g/dL — ABNORMAL LOW (ref 6.5–8.1)

## 2022-04-03 LAB — CBC
HCT: 39.3 % (ref 36.0–46.0)
Hemoglobin: 12.5 g/dL (ref 12.0–15.0)
MCH: 26.7 pg (ref 26.0–34.0)
MCHC: 31.8 g/dL (ref 30.0–36.0)
MCV: 84 fL (ref 80.0–100.0)
Platelets: 154 10*3/uL (ref 150–400)
RBC: 4.68 MIL/uL (ref 3.87–5.11)
RDW: 18.4 % — ABNORMAL HIGH (ref 11.5–15.5)
WBC: 7.5 10*3/uL (ref 4.0–10.5)
nRBC: 0 % (ref 0.0–0.2)

## 2022-04-03 LAB — PROTEIN ELECTROPHORESIS, SERUM
A/G Ratio: 0.9 (ref 0.7–1.7)
Albumin ELP: 2.5 g/dL — ABNORMAL LOW (ref 2.9–4.4)
Alpha-1-Globulin: 0.3 g/dL (ref 0.0–0.4)
Alpha-2-Globulin: 0.6 g/dL (ref 0.4–1.0)
Beta Globulin: 0.9 g/dL (ref 0.7–1.3)
Gamma Globulin: 0.9 g/dL (ref 0.4–1.8)
Globulin, Total: 2.7 g/dL (ref 2.2–3.9)
Total Protein ELP: 5.2 g/dL — ABNORMAL LOW (ref 6.0–8.5)

## 2022-04-03 LAB — KAPPA/LAMBDA LIGHT CHAINS
Kappa free light chain: 27.8 mg/L — ABNORMAL HIGH (ref 3.3–19.4)
Kappa, lambda light chain ratio: 2.03 — ABNORMAL HIGH (ref 0.26–1.65)
Lambda free light chains: 13.7 mg/L (ref 5.7–26.3)

## 2022-04-03 MED ORDER — BLISTEX MEDICATED EX OINT
TOPICAL_OINTMENT | CUTANEOUS | Status: DC | PRN
  Filled 2022-04-03: qty 6.3

## 2022-04-03 NOTE — Progress Notes (Signed)
More arousable but still a little confused--doesn't seem to know how to use knife and fork--not completely alert so may need to be monitored  She is coming around a bit more now that we have held ativan/phenobarb  Hopeful for recovery  Tried calling  [name] "friend" x1 this am at his request to nursing for med updates Called again [phone] not get him  Verneita Griffes, MD [facility] 3:20 PM

## 2022-04-03 NOTE — Progress Notes (Signed)
PROGRESS NOTE   Valerie Rodgers  QBH:[id number] DOB: March 08, 1951 DOA: 03/25/2022 PCP: Pcp, No  Brief Narrative:  71 year old black female boardinghouse resident Known underlying COPD and chronic hypoxic respiratory failure Chronic combined systolic and diastolic heart failure, EF less than 20% CKD 3B Probable underlying cirrhosis of liver Negative Lexiscan 07/23/2021 fixed anterior moderate inferolateral defects EF at the time 21% Substance abuse disorder-cocaine-chronic continued smoker Chronic lower extremity wounds since March 2023 Atrial fibrillation not on anticoagulation CHADVASC >4  Recent admission 9/24 through 03/20/2022 with edema weakness and had acute on chronic systolic dysfunction-she was diuresed 7 L given midodrine started meds and declined cessation of cocaine  Readmitted 03/25/2022 with toxic metabolic encephalopathy new tremor leukocytosis of 19 troponin 272 BNP 1853--- it was felt that she was hypercarbic on admission and started on BiPAP Ammonia was slightly elevated at 48 + group A strep thought to be associated with lower extremity cellulitis and wound infection-ID was consulted 10/12 experience agitated delirium-unclear etiology placed on CIWA protocol for suspected withdrawal  Hospital-Problem based course  Toxic metabolic encephalopathy--? withdrawal unknown cause? infectious-lower extremity, ?hypercarbia CIWA is lower in the 10-15 range but probably because of being so somnolent on BZD/phenobarb-I have discontinued both EEG 10/14 not confirmatory for seizures Continue for now d5w 75cc/h--- if unable to arouse tomorrow we will need to consider core track  Hypoxic respiratory failure hypercarbia Took off BiPAP 10/16 and hypercarbia significantly improved Needs a chest BiPAP  Sepsis on admission secondary to GaS bacteremia-on cultures Aerococcus viridans, Acinetobacter additionally ID recs Levaquin to complete abx on 10/16 [10 days]-convert to IV Holding all  opiates at this time given her confusion  COPD on home oxygen Prednisone Changed back to Solu-Medrol 40 qd on 10/13--- stopped 10/15 after 8 days therapy continue albuterol every 4 as needed  Elevated troponin on admission stress test from earlier this year showed no acute defect with negative Lexiscan Echo this admission EF 25-30% global hypokinesis?  Infiltrative cardiomyopathy- d/w Dr. Terri Skains 10/12-work-up here- await Upep and Spep are still pending--get outpatient cardiac MRI--their office will follow as OP  Systolic diastolic heart failure with grade 3 diastolic dysfunction I/O's -1.9 weight down to 82 n.p.o. at this time Corlanor 5 twice daily, Coreg 3.125 twice , midodrine 5 3 times daily --- Lasix held as remains NPO  Cardiorenal syndrome, CKD 3 Creatinine has somewhat improved-creatinine at baseline about 1.2  Chronic venous congestion liver Slightly elevated alk phos LFTs slightly up-monitor trends-ultrasound this admission showed cholelithiasis without cholecystitis Hepatitis panels are negative, RPR negative   DVT prophylaxis: scd Code Status: DNR Family Communication: Called patient's friend [name] [phone] on the phone--did not pick up Disposition:  Status is: Inpatient Remains inpatient appropriate because:   Delirious not ready for discharge   Consultants:  Infectious disease  Procedures: Echo  Antimicrobials: Unasyn--> Levaquin   Subjective:  More arousable intermittently but still too sleepy to eat mucosas dry I am unable to get a full review of system She is moving her limbs that she is still in mittens  Objective: Vitals:   04/03/22 0404 04/03/22 0500 04/03/22 0826 04/03/22 0929  BP:   116/81   Pulse: 64  62   Resp: 18  (!) 22   Temp:   98.9 F (37.2 C)   TempSrc:   Oral   SpO2: 99%  100% 96%  Weight:  82.3 kg    Height:        Intake/Output Summary (Last 24 hours) at 04/03/2022 1154 Last  data filed at 04/03/2022 0638 Gross  per 24 hour  Intake 1662.23 ml  Output 1450 ml  Net 212.23 ml    Filed Weights   04/01/22 0500 04-26-2022 0427 04/03/22 0500  Weight: 83.8 kg 82 kg 82.3 kg    Examination:  Off BiPAP mucosa is dry-no icterus no pallor-pupils are reactive to light Chest is clear anterolaterally Abdomen is obese nontender Lower extremities have bilateral wrappings which were not examined today She is not able to meaningfully make movements but withdraws from pressure over her lower extremities  Data Reviewed: personally reviewed   CBC    Component Value Date/Time   WBC 7.5 04/03/2022 0424   RBC 4.68 04/03/2022 0424   HGB 12.5 04/03/2022 0424   HCT 39.3 04/03/2022 0424   PLT 154 04/03/2022 0424   MCV 84.0 04/03/2022 0424   MCH 26.7 04/03/2022 0424   MCHC 31.8 04/03/2022 0424   RDW 18.4 (H) 04/03/2022 0424   LYMPHSABS 0.3 (L) 04/01/2022 1651   MONOABS 0.4 04/01/2022 1651   EOSABS 0.0 04/01/2022 1651   BASOSABS 0.0 04/01/2022 1651      Latest Ref Rng & Units 04/03/2022    4:24 AM 2022-04-26   10:25 AM April 26, 2022   12:37 AM  CMP  Glucose 70 - 99 mg/dL 82   130   BUN 8 - 23 mg/dL 18   26   Creatinine 0.44 - 1.00 mg/dL 1.01   0.96   Sodium 135 - 145 mmol/L 143  139  142   Potassium 3.5 - 5.1 mmol/L 3.8  3.9  4.3   Chloride 98 - 111 mmol/L 98   102   CO2 22 - 32 mmol/L 37   33   Calcium 8.9 - 10.3 mg/dL 8.7   8.3   Total Protein 6.5 - 8.1 g/dL 4.7   4.7   Total Bilirubin 0.3 - 1.2 mg/dL 1.0   0.8   Alkaline Phos 38 - 126 U/L 79   83   AST 15 - 41 U/L 22   20   ALT 0 - 44 U/L 18   18      Radiology Studies: EEG adult  Result Date: 26-Apr-2022 Lora Havens, MD     2022-04-26  8:11 AM Patient Name: Valerie Rodgers MRN: [id number] Epilepsy Attending: Lora Havens Referring Physician/Provider: Nita Sells, MD Date: 04/01/2022 Duration: 21.57 mins Patient history: 71yo F with ams. EEG to evaluate fir seizure Level of alertness: lethargic AEDs during EEG study: Phenobarb,  Ativan Technical aspects: This EEG study was done with scalp electrodes positioned according to the 10-20 International system of electrode placement. Electrical activity was reviewed with band pass filter of 1-$RemoveBef'70Hz'WXADqApdac$ , sensitivity of 7 uV/mm, display speed of 21mm/sec with a $Remo'60Hz'oOkWA$  notched filter applied as appropriate. EEG data were recorded continuously and digitally stored.  Video monitoring was available and reviewed as appropriate. Description:  EEG showed continuous generalized 3 to 6 Hz theta-delta slowing. admixed with an excessive amount of 15 to 18 Hz beta activity distributed symmetrically and diffusely.  Hyperventilation and photic stimulation were not performed.   ABNORMALITY - Continuous slow, generalized - Excessive beta, generalized IMPRESSION: This study is suggestive of moderate diffuse encephalopathy, nonspecific etiology. The excessive beta activity seen in the background is most likely due to the effect of benzodiazepine and is a benign EEG pattern. No seizures or epileptiform discharges were seen throughout the recording. Priyanka Barbra Sarks     Scheduled Meds:  metoprolol tartrate  2.5  mg Intravenous Q6H   sodium chloride flush  3 mL Intravenous Q12H   Continuous Infusions:  dextrose 75 mL/hr at 04/03/22 1148   levofloxacin (LEVAQUIN) IV 500 mg (04/03/22 1150)      LOS: 9 days   Time spent: 30  Nita Sells, MD [facility] To contact the attending provider between 7A-7P or the covering provider during after hours 7P-7A, please log into the web site [email or web address] and access using universal [place] password for that web site. If you do not have the password, please call the hospital operator.  04/03/2022, 11:54 AM

## 2022-04-03 NOTE — Progress Notes (Signed)
PT Cancellation Note  Patient Details Name: Valerie Rodgers MRN: [id number] DOB: [birth year]   Cancelled Treatment:    Reason Eval/Treat Not Completed: Medical issues which prohibited therapy (per RN pt not appropriate this date with continued decreased arousal/alertness.) Will continue to follow acutely.   Audry Riles. PTA Acute Rehabilitation Services Office: [place] 04/03/2022, 11:53 AM

## 2022-04-04 ENCOUNTER — Inpatient Hospital Stay (HOSPITAL_COMMUNITY): Payer: Medicare Other

## 2022-04-04 LAB — COMPREHENSIVE METABOLIC PANEL
ALT: UNDETERMINED U/L (ref 0–44)
AST: 20 U/L (ref 15–41)
Albumin: 2.2 g/dL — ABNORMAL LOW (ref 3.5–5.0)
Alkaline Phosphatase: 89 U/L (ref 38–126)
Anion gap: 13 (ref 5–15)
BUN: 12 mg/dL (ref 8–23)
CO2: 29 mmol/L (ref 22–32)
Calcium: 8.6 mg/dL — ABNORMAL LOW (ref 8.9–10.3)
Chloride: 97 mmol/L — ABNORMAL LOW (ref 98–111)
Creatinine, Ser: 0.72 mg/dL (ref 0.44–1.00)
GFR, Estimated: >60 mL/min (ref >60)
Glucose, Bld: 128 mg/dL — ABNORMAL HIGH (ref 70–99)
Potassium: 3.7 mmol/L (ref 3.5–5.1)
Sodium: 139 mmol/L (ref 135–145)
Total Bilirubin: UNDETERMINED mg/dL (ref 0.3–1.2)
Total Protein: 4.7 g/dL — ABNORMAL LOW (ref 6.5–8.1)

## 2022-04-04 LAB — CBC
HCT: 45.3 % (ref 36.0–46.0)
Hemoglobin: 14.1 g/dL (ref 12.0–15.0)
MCH: 26.5 pg (ref 26.0–34.0)
MCHC: 31.1 g/dL (ref 30.0–36.0)
MCV: 85 fL (ref 80.0–100.0)
Platelets: 170 10*3/uL (ref 150–400)
RBC: 5.33 MIL/uL — ABNORMAL HIGH (ref 3.87–5.11)
RDW: 18.7 % — ABNORMAL HIGH (ref 11.5–15.5)
WBC: 8.3 10*3/uL (ref 4.0–10.5)
nRBC: 0 % (ref 0.0–0.2)

## 2022-04-04 MED ORDER — IVABRADINE HCL 5 MG PO TABS
5.0000 mg | ORAL_TABLET | Freq: Two times a day (BID) | ORAL | Status: DC
  Administered 2022-04-04 – 2022-04-06 (×4): 5 mg via ORAL
  Filled 2022-04-04 (×4): qty 1

## 2022-04-04 MED ORDER — FUROSEMIDE 40 MG PO TABS
40.0000 mg | ORAL_TABLET | Freq: Every day | ORAL | Status: DC
  Administered 2022-04-04 – 2022-04-06 (×3): 40 mg via ORAL
  Filled 2022-04-04 (×3): qty 1

## 2022-04-04 MED ORDER — ZIPRASIDONE HCL 20 MG PO CAPS
20.0000 mg | ORAL_CAPSULE | Freq: Once | ORAL | Status: AC
  Administered 2022-04-04: 20 mg via ORAL
  Filled 2022-04-04: qty 1

## 2022-04-04 MED ORDER — ACETAMINOPHEN 500 MG PO TABS: 1000.0000 mg | ORAL_TABLET | Freq: Four times a day (QID) | ORAL | Status: DC | PRN

## 2022-04-04 MED ORDER — CARVEDILOL 3.125 MG PO TABS
3.1250 mg | ORAL_TABLET | Freq: Two times a day (BID) | ORAL | Status: DC
  Administered 2022-04-04 – 2022-04-06 (×4): 3.125 mg via ORAL
  Filled 2022-04-04 (×4): qty 1

## 2022-04-04 MED ORDER — ORAL CARE MOUTH RINSE: 15.0000 mL | OROMUCOSAL | Status: DC | PRN

## 2022-04-04 NOTE — Progress Notes (Signed)
Pt is taking off O2 and stats are dropping to 79-82. Was paged by Respiratory Therapist  that pt has mandatory CPAP order. Paged MD for order.

## 2022-04-04 NOTE — Progress Notes (Signed)
   04/04/22 2131  Therapy Vitals  Pulse Rate 73  Resp 19  Patient Position (if appropriate) Lying  MEWS Score/Color  MEWS Score 1  MEWS Score Color Green  Oxygen Therapy/Pulse Ox  O2 Device Nasal Cannula  O2 Therapy Oxygen humidified  O2 Flow Rate (L/min) 8 L/min  SpO2 93 %   RT called pt for desaturation spoke to Rn to get some meds so pt. Can relax and leave Bipap on as pt. Needs it

## 2022-04-04 NOTE — Progress Notes (Signed)
PROGRESS NOTE   Valerie Rodgers  OVF:[id number] DOB: 22-Sep-1950 DOA: 03/25/2022 PCP: Pcp, No  Brief Narrative:  71 year old black female boardinghouse resident Known underlying COPD and chronic hypoxic respiratory failure Chronic combined systolic and diastolic heart failure, EF less than 20% CKD 3B Probable underlying cirrhosis of liver Negative Lexiscan 07/23/2021 fixed anterior moderate inferolateral defects EF at the time 21% Substance abuse disorder-cocaine-chronic continued smoker Chronic lower extremity wounds since March 2023 Atrial fibrillation not on anticoagulation CHADVASC >4  Recent admission 9/24 through 03/20/2022 with edema weakness and had acute on chronic systolic dysfunction-she was diuresed 7 L given midodrine started meds and declined cessation of cocaine  Readmitted 03/25/2022 with toxic metabolic encephalopathy new tremor leukocytosis of 19 troponin 272 BNP 1853--- it was felt that she was hypercarbic on admission and started on BiPAP Ammonia was slightly elevated at 48 + group A strep thought to be associated with lower extremity cellulitis and wound infection-ID was consulted 10/12 experience agitated delirium-unclear etiology placed on CIWA protocol for suspected withdrawal  Hospital-Problem based course  Toxic metabolic encephalopathy--? withdrawal unknown cause? infectious-lower extremity, ?hypercarbia Felt to be combination of withdrawal and from treatment of withdrawal with phenobarb, BZD-also hypercarbia probably plays a big role and will need to have BiPAP set up before discharge EEG 10/14 not confirmatory for seizures Patient is now more oriented and can eat but need to ensure that does not become confused/hypercarbic DC D5 and monitor intake as well as ability to orient  Hypoxic respiratory failure hypercarbia Took off BiPAP 10/16 ----does require BiPAP on discharge TOC has been consulted to ensure that this is set up  Sepsis on admission secondary to  GaS bacteremia-on cultures Aerococcus viridans, Acinetobacter additionally Completed Levaquin on 10/16 [10 days] Holding all opiates at this time given her confusion  COPD on home oxygen Prednisone Changed back to Solu-Medrol 40 qd on 10/13--- stopped 10/15 after 8 days therapy continue albuterol every 4 as needed  Elevated troponin on admission stress test from earlier this year showed no acute defect with negative Lexiscan Echo this admission EF 25-30% global hypokinesis?  Infiltrative cardiomyopathy- d/w Dr. Terri Skains 10/12-work-up here- await Upep and Spep are still pending--get outpatient cardiac MRI--their office will follow as OP  Systolic diastolic heart failure with grade 3 diastolic dysfunction I/O's -935 weight down83-->79 Resuming on 10/17 Corlanor 5 twice daily, Coreg 3.125 twice , midodrine 5 3 times daily --- Lasix 40 resumed 10/17 Hold midodrine 10 mg twice daily at this time  Cardiorenal syndrome, CKD 3 Creatinine has somewhat improved-creatinine at baseline about 1.2  Chronic venous congestion liver Slightly elevated alk phos LFTs slightly up-monitor trends-ultrasound this admission showed cholelithiasis without cholecystitis Hepatitis panels are negative, RPR negative   DVT prophylaxis: scd Code Status: DNR Family Communication: Called patient's friend [name] [phone] on the phone--finally did reach him later in the evening on 10/16--explained course to date, patient condition Disposition:  Status is: Inpatient Remains inpatient appropriate because:   Just coming out of delirium-needs to be coherent for next 1 or 2 days without any further issues with mentation or need for continued BiPAP and likely can discharge to skilled   Consultants:  Infectious disease  Procedures: Echo  Antimicrobials: Unasyn--> Levaquin   Subjective:  More awake coherent on oxygen She claims to have not used any drugs while hospitalized or ingested anything that would  have caused her to be confused At this point it is unclear what caused it but she will need monitoring to ensure it  does not recur She has moderate to severe pain in lower extremities We looked at wounds as below  Objective: Vitals:   04/03/22 2336 04/04/22 0325 04/04/22 0804 04/04/22 1208  BP:  114/71 107/78 (!) 101/56  Pulse: 76 67 68 66  Resp: (!) 22 (!) 21 (!) 21 (!) 22  Temp:  97.8 F (36.6 C) 97.9 F (36.6 C) 97.8 F (36.6 C)  TempSrc:  Oral Oral Oral  SpO2: 94% 94% 97% 94%  Weight:  79.3 kg    Height:        Intake/Output Summary (Last 24 hours) at 04/04/2022 1455 Last data filed at 04/04/2022 1143 Gross per 24 hour  Intake 2531.17 ml  Output 500 ml  Net 2031.17 ml    Filed Weights   04/02/22 0427 04/03/22 0500 04/04/22 0325  Weight: 82 kg 82.3 kg 79.3 kg    Examination:  Off BiPAP mucosa is dry-no icterus no pallor-pupils are reactive to light Chest is clear anterolaterally Abdomen is obese nontender Wound exam bilaterally         Data Reviewed: personally reviewed   CBC    Component Value Date/Time   WBC 8.3 04/04/2022 0920   RBC 5.33 (H) 04/04/2022 0920   HGB 14.1 04/04/2022 0920   HCT 45.3 04/04/2022 0920   PLT 170 04/04/2022 0920   MCV 85.0 04/04/2022 0920   MCH 26.5 04/04/2022 0920   MCHC 31.1 04/04/2022 0920   RDW 18.7 (H) 04/04/2022 0920   LYMPHSABS 0.3 (L) 04/01/2022 1651   MONOABS 0.4 04/01/2022 1651   EOSABS 0.0 04/01/2022 1651   BASOSABS 0.0 04/01/2022 1651      Latest Ref Rng & Units 04/04/2022    7:14 AM 04/03/2022    4:24 AM 04/02/2022   10:25 AM  CMP  Glucose 70 - 99 mg/dL 128  82    BUN 8 - 23 mg/dL 12  18    Creatinine 0.44 - 1.00 mg/dL 0.72  1.01    Sodium 135 - 145 mmol/L 139  143  139   Potassium 3.5 - 5.1 mmol/L 3.7  3.8  3.9   Chloride 98 - 111 mmol/L 97  98    CO2 22 - 32 mmol/L 29  37    Calcium 8.9 - 10.3 mg/dL 8.6  8.7    Total Protein 6.5 - 8.1 g/dL 4.7  4.7    Total Bilirubin 0.3 - 1.2 mg/dL QUANTITY  NOT SUFFICIENT, UNABLE TO PERFORM TEST  1.0    Alkaline Phos 38 - 126 U/L 89  79    AST 15 - 41 U/L 20  22    ALT 0 - 44 U/L QUANTITY NOT SUFFICIENT, UNABLE TO PERFORM TEST  18       Radiology Studies: DG CHEST PORT 1 VIEW  Result Date: 04/04/2022 CLINICAL DATA:  Pneumonia.  Tremors. EXAM: PORTABLE CHEST 1 VIEW COMPARISON:  03/25/2022 FINDINGS: The patient is rotated to the left on today's radiograph, reducing diagnostic sensitivity and specificity. Indistinct left hemidiaphragm compatible with left lower lobe airspace opacity, mildly worsened from previous. Possible component of atelectasis. Indistinct pulmonary vasculature with mildly increased bilateral interstitial accentuation favoring interstitial edema. Mild cardiomegaly. IMPRESSION: 1. Mildly worsened left lower lobe airspace opacity, potentially from atelectasis or pneumonia. 2. Mild cardiomegaly with interstitial edema. 3. The patient is rotated to the left on today's radiograph, reducing diagnostic sensitivity and specificity. Electronically Signed   By: Van Clines M.D.   On: 04/04/2022 08:19     Scheduled  Meds:  metoprolol tartrate  2.5 mg Intravenous Q6H   sodium chloride flush  3 mL Intravenous Q12H   Continuous Infusions:  dextrose 75 mL/hr at 04/04/22 1143      LOS: 10 days   Time spent: [place], MD [facility] To contact the attending provider between 7A-7P or the covering provider during after hours 7P-7A, please log into the web site [email or web address] and access using universal [place] password for that web site. If you do not have the password, please call the hospital operator.  04/04/2022, 2:55 PM

## 2022-04-04 NOTE — Progress Notes (Signed)
PT Cancellation Note  Patient Details Name: HUYEN PERAZZO MRN: [id number] DOB: 10-14-1950   Cancelled Treatment:    Reason Eval/Treat Not Completed: Other (comment) (pt with IV team)   Josejulian Tarango B Pakou Rainbow 04/04/2022, 9:15 AM Bayard Males, PT Acute Rehabilitation Services Office: [phone]

## 2022-04-04 NOTE — Progress Notes (Signed)
   04/04/22 2152  BiPAP/CPAP/SIPAP  BiPAP/CPAP/SIPAP Pt Type Adult  Mask Type Full face mask  Mask Size Medium  Set Rate 14 breaths/min  Respiratory Rate 22 breaths/min  IPAP 14 cmH20  EPAP 4 cmH2O  Oxygen Percent 60 %  Minute Ventilation 10.6  Leak 4  Peak Inspiratory Pressure (PIP) 15  Tidal Volume (Vt) 366  BiPAP/CPAP/SIPAP BiPAP  Patient Home Equipment No  Auto Titrate No  Press High Alarm 30 cmH2O  Press Low Alarm 5 cmH2O  BiPAP/CPAP /SiPAP Vitals  Pulse Rate 64  Resp 20  SpO2 95 %  MEWS Score/Color  MEWS Score 1  MEWS Score Color Green   Placed pt. On bipap on above settings.

## 2022-04-04 NOTE — Progress Notes (Signed)
Occupational Therapy Treatment Patient Details Name: Valerie Rodgers MRN: [id number] DOB: Jul 08, 1950 Today's Date: 04/04/2022   History of present illness Pt is 71 yo female admitted 03/25/22 with tremor and somnolence, with COPD exacerbation. 10/12 agitated delirium. 10/15 EEG with encephalopathy. PMH to include Afib, COPD on supplemental O2, leg wounds, CHF, substance abuse, bipolar, CKD, and cirrhosis.   OT comments  Valerie Rodgers did not make functional progress this date compared to previous session, increased lethargy and confusion this date. Overall she required max A and cues to transfer to sitting with increased arousal once sitting. Pt completed UB ADLs in sitting with up to max A, and had 2 posterior LOBs. Pt with poor activity tolerance and increased weakness. 1x sit<>stand attempt with minimal hip clearance with max A. OT to continue to follow acutely. POC remains appropriate.    Recommendations for follow up therapy are one component of a multi-disciplinary discharge planning process, led by the attending physician.  Recommendations may be updated based on patient status, additional functional criteria and insurance authorization.    Follow Up Recommendations  Skilled nursing-short term rehab (<3 hours/day)    Assistance Recommended at Discharge Frequent or constant Supervision/Assistance  Patient can return home with the following  A lot of help with walking and/or transfers;A lot of help with bathing/dressing/bathroom;Direct supervision/assist for medications management;Direct supervision/assist for financial management;Assist for transportation;Help with stairs or ramp for entrance   Equipment Recommendations  None recommended by OT       Precautions / Restrictions Precautions Precautions: Fall Precaution Comments: watch SpO2, baseline 2-4L Restrictions Weight Bearing Restrictions: No       Mobility Bed Mobility Overal bed mobility: Needs Assistance Bed Mobility: Supine to  Sit, Sit to Supine     Supine to sit: Max assist Sit to supine: Max assist        Transfers Overall transfer level: Needs assistance Equipment used: 1 person hand held assist Transfers: Sit to/from Stand Sit to Stand: Max assist           General transfer comment: attempted sit<>stand at EOB, pt participating minimally. Max A to clear bottom     Balance Overall balance assessment: Needs assistance Sitting-balance support: Bilateral upper extremity supported, Feet supported Sitting balance-Leahy Scale: Poor Sitting balance - Comments: 2x posterior LOB while sitting EOB     Standing balance-Leahy Scale: Zero                             ADL either performed or assessed with clinical judgement   ADL Overall ADL's : Needs assistance/impaired Eating/Feeding: Sitting;Moderate assistance Eating/Feeding Details (indicate cue type and reason): mod A for sitting balance, grasp and accuracy Grooming: Wash/dry face;Minimal assistance;Sitting Grooming Details (indicate cue type and reason): for balance in sitting 2x posterior LOB Upper Body Bathing: Maximal assistance;Sitting Upper Body Bathing Details (indicate cue type and reason): pt not participating in UB bathing despite max cues. required BUE supported externally for balance                         Functional mobility during ADLs: Maximal assistance (bed level) General ADL Comments: impaird cognition and weakness    Extremity/Trunk Assessment Upper Extremity Assessment Upper Extremity Assessment: Generalized weakness   Lower Extremity Assessment Lower Extremity Assessment: Defer to PT evaluation        Vision   Vision Assessment?: No apparent visual deficits Additional Comments: would benefit from further assessment  Perception Perception Perception: Not tested   Praxis      Cognition Arousal/Alertness: Awake/alert Behavior During Therapy: Impulsive Overall Cognitive Status:  Impaired/Different from baseline Area of Impairment: Attention, Memory, Following commands, Orientation, Awareness, Safety/judgement, Problem solving                 Orientation Level: Disoriented to, Situation Current Attention Level: Sustained Memory: Decreased recall of precautions, Decreased short-term memory Following Commands: Follows one step commands inconsistently Safety/Judgement: Decreased awareness of safety, Decreased awareness of deficits Awareness: Intellectual Problem Solving: Slow processing, Decreased initiation, Difficulty sequencing, Requires verbal cues General Comments: lethargic initially. improved arousal once sitting EOB. Required repetitive cues to participate in functional tasks. perseverating on wanting "cookies and ice cream."              General Comments VSS on 2L    Pertinent Vitals/ Pain       Pain Assessment Pain Assessment: Faces Faces Pain Scale: Hurts a little bit Pain Location: wounds on BLEs Pain Descriptors / Indicators: Constant, Grimacing Pain Intervention(s): Limited activity within patient's tolerance, Monitored during session   Frequency  Min 2X/week        Progress Toward Goals  OT Goals(current goals can now be found in the care plan section)  Progress towards OT goals: Not progressing toward goals - comment (increased assist required)  Acute Rehab OT Goals Patient Stated Goal: to get a cookie OT Goal Formulation: With patient Time For Goal Achievement: 04/11/22 Potential to Achieve Goals: Good ADL Goals Pt Will Perform Grooming: with min guard assist;standing Pt Will Perform Upper Body Dressing: with min guard assist;standing Pt Will Perform Lower Body Dressing: with min assist;sit to/from stand Pt Will Transfer to Toilet: with min guard assist;ambulating Pt Will Perform Toileting - Clothing Manipulation and hygiene: with modified independence;sitting/lateral leans Additional ADL Goal #1: Pt will indep follow 2  step commands to complete functional task  Plan Discharge plan remains appropriate       AM-PAC OT "6 Clicks" Daily Activity     Outcome Measure   Help from another person eating meals?: A Little Help from another person taking care of personal grooming?: A Lot Help from another person toileting, which includes using toliet, bedpan, or urinal?: Total Help from another person bathing (including washing, rinsing, drying)?: A Lot Help from another person to put on and taking off regular upper body clothing?: A Lot Help from another person to put on and taking off regular lower body clothing?: Total 6 Click Score: 11    End of Session Equipment Utilized During Treatment: Gait belt;Oxygen  OT Visit Diagnosis: Unsteadiness on feet (R26.81);Other abnormalities of gait and mobility (R26.89);Muscle weakness (generalized) (M62.81)   Activity Tolerance Patient tolerated treatment well   Patient Left in bed;with call bell/phone within reach;with bed alarm set   Nurse Communication Mobility status        Time: [id number] OT Time Calculation (min): 16 min  Charges: OT General Charges $OT Visit: 1 Visit OT Treatments $Self Care/Home Management : 8-22 mins    Elliot Cousin 04/04/2022, 11:37 AM

## 2022-04-04 NOTE — TOC Progression Note (Addendum)
Transition of Care [facility]) - Progression Note    Patient Details  Name: Valerie Rodgers MRN: [id number] Date of Birth: January 09, 1951  [facility] CM/SW Contact  Reece Agar, [place] Phone Number: 04/04/2022, 11:05 AM  Clinical Narrative:    CSW spoke with pt friend and support person [name] to discuss SNF at [place]. CSW explained that the only SNF offer is [facility] and [name] is agreeable to [facility] [facility], CSW will follow up with the SNF on bed availability and start auth today.  CSW started auth ref# [id number].   Expected Discharge Plan: [place] Barriers to Discharge: Continued Medical Work up  Expected Discharge Plan and Services Expected Discharge Plan: [place]   Discharge Planning Services: CM Consult Post Acute Care Choice: [place] Living arrangements for the past 2 months: [place]                 DME Arranged: N/A DME Agency: NA       HH Arranged: NA           Social Determinants of Health (SDOH) Interventions    Readmission Risk Interventions    03/17/2022    2:55 PM  Readmission Risk Prevention Plan  HRI or Home Care Consult Complete  Social Work Consult for [place] Planning/Counseling Complete  Palliative Care Screening Not Applicable  Medication Review [employer]) Referral to Pharmacy

## 2022-04-04 NOTE — Progress Notes (Signed)
Physical Therapy Treatment Patient Details Name: Valerie Rodgers MRN: [id number] DOB: Oct 28, 1950 Today's Date: 04/04/2022   History of Present Illness Pt is 71 yo female admitted 03/25/22 with tremor and somnolence, with COPD exacerbation. 10/12 agitated delirium. 10/15 EEG with encephalopathy. PMH to include Afib, COPD on supplemental O2, leg wounds, CHF, substance abuse, bipolar, CKD, and cirrhosis.    PT Comments    Pt pleasantly confused with O2 off, food spilled and incontinent on arrival. Pt with very limited ability to assist with mobility this session requiring max assist to roll and unable to achieve sitting or transfers OOB. Pt with grossly a week of immobility due to encephalopathy and respiratory distress with decreased mobility from prior session. D/C to [facility] remains appropriate with goals downgraded.   85% on RA with 3L reapplied for 93%    Recommendations for follow up therapy are one component of a multi-disciplinary discharge planning process, led by the attending physician.  Recommendations may be updated based on patient status, additional functional criteria and insurance authorization.  Follow Up Recommendations  Skilled nursing-short term rehab (<3 hours/day) Can patient physically be transported by private vehicle: No   Assistance Recommended at Discharge Frequent or constant Supervision/Assistance  Patient can return home with the following A lot of help with bathing/dressing/bathroom;Assistance with cooking/housework;Assist for transportation;Help with stairs or ramp for entrance;Two people to help with walking and/or transfers   Equipment Recommendations  None recommended by PT    Recommendations for Other Services       Precautions / Restrictions Precautions Precautions: Fall;Other (comment) Precaution Comments: watch SpO2, baseline 2-4L Restrictions Weight Bearing Restrictions: No     Mobility  Bed Mobility Overal bed mobility: Needs Assistance Bed  Mobility: Rolling Rolling: Max assist         General bed mobility comments: attempted to have pt bring legs off EOB to sit up but max assist to move legs and noted pt soiled with food, urine and BM with pt legs returned to bed and max assist to roll bil with +2 for pericare and linen change. Mod assist to slide up in bed with bed in trendelenburg, increased time and cues with pt using arms and hips to assist scooting    Transfers                   General transfer comment: unable this date    Ambulation/Gait                   Stairs             Wheelchair Mobility    Modified Rankin (Stroke Patients Only)       Balance                                            Cognition Arousal/Alertness: Awake/alert Behavior During Therapy: Restless Overall Cognitive Status: Impaired/Different from baseline Area of Impairment: Orientation, Attention, Memory, Following commands, Safety/judgement, Awareness, Problem solving                 Orientation Level: Disoriented to, Situation, Time Current Attention Level: Sustained Memory: Decreased short-term memory Following Commands: Follows one step commands inconsistently Safety/Judgement: Decreased awareness of safety, Decreased awareness of deficits   Problem Solving: Slow processing, Decreased initiation, Difficulty sequencing, Requires verbal cues          Exercises  General Comments General comments (skin integrity, edema, etc.): VSS on 2L      Pertinent Vitals/Pain Pain Assessment Pain Assessment: PAINAD Breathing: normal Negative Vocalization: none Facial Expression: smiling or inexpressive Body Language: relaxed Consolability: no need to console PAINAD Score: 0    Home Living                          Prior Function            PT Goals (current goals can now be found in the care plan section) Acute Rehab PT Goals Time For Goal Achievement:  04/18/22 Potential to Achieve Goals: Fair Progress towards PT goals: Not progressing toward goals - comment;Goals downgraded-see care plan    Frequency    Min 3X/week      PT Plan Current plan remains appropriate    Co-evaluation              AM-PAC PT "6 Clicks" Mobility   Outcome Measure  Help needed turning from your back to your side while in a flat bed without using bedrails?: Total Help needed moving from lying on your back to sitting on the side of a flat bed without using bedrails?: Total Help needed moving to and from a bed to a chair (including a wheelchair)?: Total Help needed standing up from a chair using your arms (e.g., wheelchair or bedside chair)?: Total Help needed to walk in hospital room?: Total Help needed climbing 3-5 steps with a railing? : Total 6 Click Score: 6    End of Session Equipment Utilized During Treatment: Oxygen Activity Tolerance: Patient limited by fatigue Patient left: in bed;with call bell/phone within reach;with nursing/sitter in room Nurse Communication: Mobility status PT Visit Diagnosis: Unsteadiness on feet (R26.81);Other abnormalities of gait and mobility (R26.89);Muscle weakness (generalized) (M62.81)     Time: 1331-1350 PT Time Calculation (min) (ACUTE ONLY): 19 min  Charges:  $Therapeutic Activity: 8-22 mins                     Bayard Males, PT Acute Rehabilitation Services Office: [phone]    Sandy Salaam Anson Peddie 04/04/2022, 1:59 PM

## 2022-04-04 NOTE — Plan of Care (Signed)
  Problem: Clinical Measurements: Goal: Respiratory complications will improve Outcome: Progressing Goal: Cardiovascular complication will be avoided Outcome: Progressing   Problem: Nutrition: Goal: Adequate nutrition will be maintained Outcome: Progressing   Problem: Elimination: Goal: Will not experience complications related to bowel motility Outcome: Progressing Goal: Will not experience complications related to urinary retention Outcome: Progressing   Problem: Pain Managment: Goal: General experience of comfort will improve Outcome: Not Progressing

## 2022-04-04 NOTE — Progress Notes (Signed)
Mobility Specialist Progress Note   04/04/22 1229  Mobility  Activity Contraindicated/medical hold (increased confusion/lethargy)   Pt not appropriate for mobility specialist at this time given level of complexity, physical assist, and/or precautions as advised by RN. Will  hold today and continue to follow for readiness.  [place] Audreyana Huntsberry, [place], [place]  Office: [phone]

## 2022-04-05 DIAGNOSIS — L03116 Cellulitis of left lower limb: Secondary | ICD-10-CM

## 2022-04-05 DIAGNOSIS — L03115 Cellulitis of right lower limb: Secondary | ICD-10-CM

## 2022-04-05 LAB — COMPREHENSIVE METABOLIC PANEL
ALT: 15 U/L (ref 0–44)
AST: 25 U/L (ref 15–41)
Albumin: 2.2 g/dL — ABNORMAL LOW (ref 3.5–5.0)
Alkaline Phosphatase: 105 U/L (ref 38–126)
Anion gap: 11 (ref 5–15)
BUN: 13 mg/dL (ref 8–23)
CO2: 29 mmol/L (ref 22–32)
Calcium: 8 mg/dL — ABNORMAL LOW (ref 8.9–10.3)
Chloride: 99 mmol/L (ref 98–111)
Creatinine, Ser: 0.9 mg/dL (ref 0.44–1.00)
GFR, Estimated: >60 mL/min (ref >60)
Glucose, Bld: 106 mg/dL — ABNORMAL HIGH (ref 70–99)
Potassium: 4.1 mmol/L (ref 3.5–5.1)
Sodium: 139 mmol/L (ref 135–145)
Total Bilirubin: 0.8 mg/dL (ref 0.3–1.2)
Total Protein: 4.9 g/dL — ABNORMAL LOW (ref 6.5–8.1)

## 2022-04-05 LAB — MULTIPLE MYELOMA PANEL, SERUM
Albumin SerPl Elph-Mcnc: 2.5 g/dL — ABNORMAL LOW (ref 2.9–4.4)
Albumin/Glob SerPl: 1.1 (ref 0.7–1.7)
Alpha 1: 0.2 g/dL (ref 0.0–0.4)
Alpha2 Glob SerPl Elph-Mcnc: 0.6 g/dL (ref 0.4–1.0)
B-Globulin SerPl Elph-Mcnc: 0.8 g/dL (ref 0.7–1.3)
Gamma Glob SerPl Elph-Mcnc: 0.8 g/dL (ref 0.4–1.8)
Globulin, Total: 2.5 g/dL (ref 2.2–3.9)
IgA: 167 mg/dL (ref 64–422)
IgG (Immunoglobin G), Serum: 896 mg/dL (ref 586–1602)
IgM (Immunoglobulin M), Srm: 92 mg/dL (ref 26–217)
Total Protein ELP: 5 g/dL — ABNORMAL LOW (ref 6.0–8.5)

## 2022-04-05 NOTE — Progress Notes (Signed)
Mobility Specialist Progress Note    04/05/22 1335  Mobility  Activity Contraindicated/medical hold   RN advised based on cognition. Will f/u as appropriate.   Hildred Alamin Mobility Specialist  Secure Chat Only

## 2022-04-05 NOTE — Progress Notes (Signed)
[facility] PROGRESS NOTE    Progress Note  Valerie Rodgers  GUY:[id number] DOB: 09-Jul-1950 DOA: 03/25/2022 PCP: Pcp, No     Brief Narrative:   Valerie Rodgers is an 71 y.o. 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%, chronic kidney disease stage IIIb, underlying cirrhosis, negative Lexiscan, substance abuse disorder ongoing tobacco and cocaine user, chronic lower extremity edema since March 2023, chronic atrial fibrillation not on anticoagulation with a chads Vascor greater than 4, recently discharged on 03/20/2022 for acute stated heart failure which was diuresed 7 L with IV Lasix and midodrine likely in the setting of cocaine, readmitted again on 03/25/2022 with new tremors leukocytosis of 19 BNP of 2000 hypercarbic on admission started on BiPAP, ammonia level 48, blood cultures were obtained on 03/25/2022 the sugar coccus viridans and Acinetobacter species, surveillance blood cultures on 03/27/2022 negative till date, ID was brought on board, on 03/30/2022 started experiencing delirium of unclear etiology placed on CIWA protocol physician was suspecting withdrawal.   Assessment/Plan:   Toxic metabolic encephalopathy: Felt to be a combination of withdrawal from treatment from phenobarbital and benzodiazepine, probably hypercarbia. EEG done on 12/30/2021 showed no evidence of seizures. Now this morning more oriented able to eat And able to carry on a reasonable conversation. She has been off opiates due to her confusion. We will monitor an additional 24 hours. Physical therapy evaluated the patient recommended short-term rehab.  Acute respiratory failure with hypoxia and hypercarbia: Took off her BiPAP and 01/01/2022, will probably require BiPAP on discharge. TOC has been consulted and is currently setting it up.  Sepsis secondary to Aerococcus viridans and Acinetobacter species  bacteremia: Infectious disease was consulted completed course of antibiotics on 04/03/2022.  COPD on home oxygen: Continue albuterol as needed, she is off steroids.  Elevated troponins: Likely due to demand ischemia. Going back through records Lexiscan from earlier this year showed an EF of 20% with global hypokinesia. Appears physician discussed with Dr. Geanie Logan on 12/28/2021 work-up will continue as an outpatient. UPEP and SPEP are still pending we will need a cardiac MRI as an outpatient.  Chronic systolic and diastolic heart failure grade 3, Midodrine has been discontinued, she is currently on Coreg, Lasix, Corlanor.  Cardiorenal syndrome: Creatinine has somewhat improved with diuresis and medication her creatinine has returned to baseline to 1.2.  Chronic venous congestion of the liver: Likely due to decompensated heart failure, elevation in alkaline phosphatase and LFTs ultrasound on admission showed choledocholithiasis without cholecystitis. Hepatitis panel is negative, RPR is negative.  Sickle decubitus ulcer stage II pressure ulcer present on admission RN Pressure Injury Documentation: Pressure Injury 03/13/22 Buttocks Left;Lower Stage 2 -  Partial thickness loss of dermis presenting as a shallow open injury with a red, pink wound bed without slough. (Active)  03/13/22 0730  Location: Buttocks  Location Orientation: Left;Lower  Staging: Stage 2 -  Partial thickness loss of dermis presenting as a shallow open injury with a red, pink wound bed without slough.  Wound Description (Comments):   Present on Admission: Yes  Dressing Type Gauze (Comment) 04/05/22 0751    Estimated body mass index is 28.99 kg/m as calculated from the following:   Height as of this encounter: 5\' 4"  (1.626 m).   Weight as of this encounter: 76.6 kg.   DVT prophylaxis: lovenox Family Communication:none Status is: Inpatient Remains inpatient appropriate because: Toxic encephalopathy in the setting  of bacteremia  Code Status:     Code Status Orders  (From admission, onward)           Start     Ordered   03/25/22 2109  Do not attempt resuscitation (DNR)  Continuous       Question Answer Comment  In the event of cardiac or respiratory ARREST Do not call a "code blue"   In the event of cardiac or respiratory ARREST Do not perform Intubation, CPR, defibrillation or ACLS   In the event of cardiac or respiratory ARREST Use medication by any route, position, wound care, and other measures to relive pain and suffering. May use oxygen, suction and manual treatment of airway obstruction as needed for comfort.      03/25/22 2110           Code Status History     Date Active Date Inactive Code Status Order ID Comments User Context   03/20/2022 0759 03/20/2022 1946 DNR [id number]  Domenic Polite, MD Inpatient   03/13/2022 0002 03/20/2022 0759 Full Code [id number]  Vernelle Emerald, MD ED   11/16/2021 0752 11/18/2021 2129 DNR [id number]  Rosezella Rumpf, NP Inpatient   11/13/2021 2319 11/16/2021 0752 Full Code [id number]  Toy Baker, MD Inpatient   07/18/2021 0721 07/23/2021 2119 Full Code [id number]  Shalhoub, Sherryll Burger, MD ED         IV Access:   Peripheral IV   Procedures and diagnostic studies:   DG CHEST PORT 1 VIEW  Result Date: 04/04/2022 CLINICAL DATA:  Pneumonia.  Tremors. EXAM: PORTABLE CHEST 1 VIEW COMPARISON:  03/25/2022 FINDINGS: The patient is rotated to the left on today's radiograph, reducing diagnostic sensitivity and specificity. Indistinct left hemidiaphragm compatible with left lower lobe airspace opacity, mildly worsened from previous. Possible component of atelectasis. Indistinct pulmonary vasculature with mildly increased bilateral interstitial accentuation favoring interstitial edema. Mild cardiomegaly. IMPRESSION: 1. Mildly worsened left lower lobe airspace opacity, potentially from atelectasis or pneumonia. 2. Mild cardiomegaly with interstitial  edema. 3. The patient is rotated to the left on today's radiograph, reducing diagnostic sensitivity and specificity. Electronically Signed   By: Van Clines M.D.   On: 04/04/2022 08:19     Medical Consultants:   None.   Subjective:    THEARY BETTERTON denies any pain or shortness of breath.  Objective:    Vitals:   04/04/22 2152 04/04/22 2304 04/05/22 0013 04/05/22 0336  BP:  113/87  (!) 114/94  Pulse: 64  74 68  Resp: 20  19 18   Temp:  98 F (36.7 C)  98.6 F (37 C)  TempSrc:  Oral  Oral  SpO2: 95%  99% 98%  Weight:    76.6 kg  Height:       SpO2: 98 % O2 Flow Rate (L/min): 66 L/min FiO2 (%): 40 %   Intake/Output Summary (Last 24 hours) at 04/05/2022 0818 Last data filed at 04/05/2022 0705 Gross per 24 hour  Intake 2951.17 ml  Output 2200 ml  Net 751.17 ml   Filed Weights   04/03/22 0500 04/04/22 0325 04/05/22 0336  Weight: 82.3 kg 79.3 kg 76.6 kg    Exam: General exam: In no acute distress. Respiratory system: Good air movement and clear to auscultation. Cardiovascular system: S1 & S2 heard, RRR. No JVD. Gastrointestinal system: Abdomen is nondistended, soft and nontender.  Extremities: No pedal edema. Skin: No rashes, lesions or ulcers Psychiatry: Judgement and insight appear normal. Mood & affect appropriate.    Data Reviewed:  Labs: Basic Metabolic Panel: Recent Labs  Lab 04/01/22 0020 04/02/22 0037 04/02/22 1025 04/03/22 0424 04/04/22 0714 04/05/22 0025  NA 144 142 139 143 139 139  K 4.6 4.3 3.9 3.8 3.7 4.1  CL 104 102  --  98 97* 99  CO2 34* 33*  --  37* 29 29  GLUCOSE 129* 130*  --  82 128* 106*  BUN 34* 26*  --  18 12 13   CREATININE 1.16* 0.96  --  1.01* 0.72 0.90  CALCIUM 8.4* 8.3*  --  8.7* 8.6* 8.0*  MG  --  1.8  --   --   --   --    GFR Estimated Creatinine Clearance: 56.6 mL/min (by C-G formula based on SCr of 0.9 mg/dL). Liver Function Tests: Recent Labs  Lab 04/01/22 0020 04/02/22 0037 04/03/22 0424  04/04/22 0714 04/05/22 0025  AST 28 20 22 20 25   ALT 23 18 18  QUANTITY NOT SUFFICIENT, UNABLE TO PERFORM TEST 15  ALKPHOS 84 83 79 89 105  BILITOT 0.8 0.8 1.0 QUANTITY NOT SUFFICIENT, UNABLE TO PERFORM TEST 0.8  PROT 5.0* 4.7* 4.7* 4.7* 4.9*  ALBUMIN 2.4* 2.2* 2.2* 2.2* 2.2*   No results for input(s): "LIPASE", "AMYLASE" in the last 168 hours. No results for input(s): "AMMONIA" in the last 168 hours. Coagulation profile No results for input(s): "INR", "PROTIME" in the last 168 hours. COVID-19 Labs  No results for input(s): "DDIMER", "FERRITIN", "LDH", "CRP" in the last 72 hours.  Lab Results  Component Value Date   SARSCOV2NAA NEGATIVE 03/25/2022   [place] NEGATIVE 03/12/2022   [place] NEGATIVE 07/17/2021    CBC: Recent Labs  Lab 04/01/22 1651 04/02/22 0037 04/02/22 1025 04/03/22 0424 04/04/22 0920  WBC 8.4 7.7  --  7.5 8.3  NEUTROABS 7.5  --   --   --   --   HGB 11.8* 11.6* 13.3 12.5 14.1  HCT 36.9 35.0* 39.0 39.3 45.3  MCV 82.9 81.6  --  84.0 85.0  PLT 137* 149*  --  154 170   Cardiac Enzymes: No results for input(s): "CKTOTAL", "CKMB", "CKMBINDEX", "TROPONINI" in the last 168 hours. BNP (last 3 results) No results for input(s): "PROBNP" in the last 8760 hours. CBG: Recent Labs  Lab 03/31/22 2320 04/01/22 0330 04/01/22 0854 04/01/22 1135 04/01/22 1548  GLUCAP 92 98 106* 98 100*   D-Dimer: No results for input(s): "DDIMER" in the last 72 hours. Hgb A1c: No results for input(s): "HGBA1C" in the last 72 hours. Lipid Profile: No results for input(s): "CHOL", "HDL", "LDLCALC", "TRIG", "CHOLHDL", "LDLDIRECT" in the last 72 hours. Thyroid function studies: No results for input(s): "TSH", "T4TOTAL", "T3FREE", "THYROIDAB" in the last 72 hours.  Invalid input(s): "FREET3" Anemia work up: No results for input(s): "VITAMINB12", "FOLATE", "FERRITIN", "TIBC", "IRON", "RETICCTPCT" in the last 72 hours. Sepsis Labs: Recent Labs  Lab 04/01/22 1651  04/02/22 0037 04/03/22 0424 04/04/22 0920  WBC 8.4 7.7 7.5 8.3   Microbiology Recent Results (from the past 240 hour(s))  Culture, blood (Routine X 2) w Reflex to ID Panel     Status: None   Collection Time: 03/27/22  7:34 AM   Specimen: BLOOD RIGHT FOREARM  Result Value Ref Range Status   Specimen Description BLOOD RIGHT FOREARM  Final   Special Requests   Final    BOTTLES DRAWN AEROBIC AND ANAEROBIC Blood Culture adequate volume   Culture   Final    NO GROWTH 5 DAYS Performed at [facility], [address] [place], [place] [address]    Report Status 04/01/2022 FINAL  Final  Culture, blood (Routine X 2) w Reflex to ID Panel     Status: None   Collection Time: 03/27/22  7:37 AM   Specimen: BLOOD  Result Value Ref Range Status   Specimen Description BLOOD LEFT ANTECUBITAL  Final   Special Requests   Final    BOTTLES DRAWN AEROBIC AND ANAEROBIC Blood Culture results may not be optimal due to an inadequate volume of blood received in culture bottles   Culture   Final    NO GROWTH 5 DAYS Performed at [facility], [place] [address], [place], [place] [address]    Report Status 04/01/2022 FINAL  Final     Medications:    carvedilol  3.125 mg Oral BID WC   furosemide  40 mg Oral Daily   ivabradine  5 mg Oral BID WC   sodium chloride flush  3 mL Intravenous Q12H   Continuous Infusions:    LOS: 11 days   Charlynne Cousins  [facility]  04/05/2022, 8:18 AM

## 2022-04-05 NOTE — TOC Progression Note (Signed)
Transition of Care [facility]) - Progression Note    Patient Details  Name: Valerie Rodgers MRN: [id number] Date of Birth: [birth year]  Transition of Care [facility]) CM/SW Contact  Reece Agar, [place] Phone Number: 04/05/2022, 11:43 AM  Clinical Narrative:    Pt auth is approved, pt is not medically stable for DC today. Pt will DC to [facility] and her friend [name] will complete paperwork today at 3pm.    Expected Discharge Plan: [place] Barriers to Discharge: Continued Medical Work up  Expected Discharge Plan and Services Expected Discharge Plan: [place]   Discharge Planning Services: CM Consult Post Acute Care Choice: [place] Living arrangements for the past 2 months: [place]                 DME Arranged: N/A DME Agency: NA       HH Arranged: NA           Social Determinants of Health (SDOH) Interventions    Readmission Risk Interventions    03/17/2022    2:55 PM  Readmission Risk Prevention Plan  HRI or Home Care Consult Complete  Social Work Consult for [place] Planning/Counseling Complete  Palliative Care Screening Not Applicable  Medication Review Press photographer) Referral to Pharmacy

## 2022-04-05 NOTE — Progress Notes (Signed)
   04/05/22 0013  BiPAP/CPAP/SIPAP  $ Non-Invasive Home Ventilator  Subsequent  BiPAP/CPAP/SIPAP Pt Type Adult  Mask Type Full face mask  Mask Size Medium  Set Rate 14 breaths/min  Respiratory Rate 22 breaths/min  IPAP 14 cmH20  EPAP 4 cmH2O  Oxygen Percent 60 %  Minute Ventilation 13.8  Leak 12  Peak Inspiratory Pressure (PIP) 15  Tidal Volume (Vt) 881  BiPAP/CPAP/SIPAP BiPAP  Patient Home Equipment No  Auto Titrate Yes  Press High Alarm 30 cmH2O  Press Low Alarm 5 cmH2O  Nasal massage performed No (comment)  BiPAP/CPAP /SiPAP Vitals  Pulse Rate 74  Resp 19  SpO2 (!) 82 %  MEWS Score/Color  MEWS Score 0  MEWS Score Color Green   Placed pt back on bipps due to pt. Removing it

## 2022-04-05 NOTE — Plan of Care (Signed)
  Problem: Clinical Measurements: Goal: Cardiovascular complication will be avoided Outcome: Progressing   Problem: Nutrition: Goal: Adequate nutrition will be maintained Outcome: Progressing   Problem: Elimination: Goal: Will not experience complications related to urinary retention Outcome: Progressing   Problem: Clinical Measurements: Goal: Respiratory complications will improve Outcome: Not Progressing   Problem: Activity: Goal: Risk for activity intolerance will decrease Outcome: Not Progressing   Problem: Elimination: Goal: Will not experience complications related to bowel motility Outcome: Not Progressing

## 2022-04-06 DIAGNOSIS — L89322 Pressure ulcer of left buttock, stage 2: Secondary | ICD-10-CM

## 2022-04-06 LAB — COMPREHENSIVE METABOLIC PANEL
ALT: 17 U/L (ref 0–44)
AST: 25 U/L (ref 15–41)
Albumin: 2.2 g/dL — ABNORMAL LOW (ref 3.5–5.0)
Alkaline Phosphatase: 108 U/L (ref 38–126)
Anion gap: 6 (ref 5–15)
BUN: 14 mg/dL (ref 8–23)
CO2: 36 mmol/L — ABNORMAL HIGH (ref 22–32)
Calcium: 8.3 mg/dL — ABNORMAL LOW (ref 8.9–10.3)
Chloride: 99 mmol/L (ref 98–111)
Creatinine, Ser: 1.08 mg/dL — ABNORMAL HIGH (ref 0.44–1.00)
GFR, Estimated: 55 mL/min — ABNORMAL LOW (ref >60)
Glucose, Bld: 124 mg/dL — ABNORMAL HIGH (ref 70–99)
Potassium: 3.8 mmol/L (ref 3.5–5.1)
Sodium: 141 mmol/L (ref 135–145)
Total Bilirubin: 0.6 mg/dL (ref 0.3–1.2)
Total Protein: 4.8 g/dL — ABNORMAL LOW (ref 6.5–8.1)

## 2022-04-06 MED ORDER — MIDODRINE HCL 5 MG PO TABS
5.0000 mg | ORAL_TABLET | Freq: Two times a day (BID) | ORAL | Status: DC
  Administered 2022-04-06: 5 mg via ORAL
  Filled 2022-04-06: qty 1

## 2022-04-06 MED ORDER — HALOPERIDOL LACTATE 5 MG/ML IJ SOLN
5.0000 mg | Freq: Once | INTRAMUSCULAR | Status: AC
  Administered 2022-04-06: 5 mg via INTRAVENOUS
  Filled 2022-04-06: qty 1

## 2022-04-06 NOTE — Progress Notes (Signed)
Discharge to [facility] via [facility] and alert.

## 2022-04-06 NOTE — TOC Transition Note (Addendum)
Transition of Care [facility]) - CM/SW Discharge Note   Patient Details  Name: Valerie Rodgers MRN: [id number] Date of Birth: December 02, 1950  Transition of Care [facility]) CM/SW Contact:  Tresa Endo Phone Number: 04/06/2022, 8:41 AM   Clinical Narrative:    Patient will DC to: [facility] Anticipated DC date: 04/06/2022 Family notified: [place] Transport by: Corey Harold   Per MD patient ready for DC to [facility] room 3243. RN to call report prior to discharge [phone]). RN, patient, patient's family, and facility notified of DC. Discharge Summary and FL2 sent to facility. DC packet on chart. Ambulance transport requested for patient.   CSW will sign off for now as social work intervention is no longer needed. Please consult [place] again if new needs arise.     Final next level of care: Skilled Nursing Facility Barriers to Discharge: Continued Medical Work up   Patient Goals and CMS Choice Patient states their goals for this hospitalization and ongoing recovery are:: to get stronger      Discharge Placement                       Discharge Plan and Services   Discharge Planning Services: CM Consult Post Acute Care Choice: [place]          DME Arranged: N/A DME Agency: NA       HH Arranged: NA          Social Determinants of Health (SDOH) Interventions     Readmission Risk Interventions    03/17/2022    2:55 PM  Readmission Risk Prevention Plan  HRI or Home Care Consult Complete  Social Work Consult for [place] Planning/Counseling Complete  Palliative Care Screening Not Applicable  Medication Review Press photographer) Referral to Pharmacy

## 2022-04-06 NOTE — Progress Notes (Signed)
Physical Therapy Treatment Patient Details Name: Valerie Rodgers MRN: [id number] DOB: 05/12/1951 Today's Date: 04/06/2022   History of Present Illness Pt is 71 yo female admitted 03/25/22 with tremor and somnolence, with COPD exacerbation. 10/12 agitated delirium. 10/15 EEG with encephalopathy. PMH to include Afib, COPD on supplemental O2, leg wounds, CHF, substance abuse, bipolar, CKD, and cirrhosis.    PT Comments    Pt pleasantly confused with increased ability to follow commands and mobilize this session. Pt with periods of tangential statements but able to participate in mobility and work on self-feeding end of session. Pt continues to need assist for cognition and all mobility with D/C plan appropriate and frequency updated.   90-94% on 3L HR 70 BP 98/62 (74)   Recommendations for follow up therapy are one component of a multi-disciplinary discharge planning process, led by the attending physician.  Recommendations may be updated based on patient status, additional functional criteria and insurance authorization.  Follow Up Recommendations  Skilled nursing-short term rehab (<3 hours/day) Can patient physically be transported by private vehicle: No   Assistance Recommended at Discharge Frequent or constant Supervision/Assistance  Patient can return home with the following A lot of help with bathing/dressing/bathroom;Assistance with cooking/housework;Assist for transportation;Help with stairs or ramp for entrance;Two people to help with walking and/or transfers   Equipment Recommendations  None recommended by PT    Recommendations for Other Services       Precautions / Restrictions Precautions Precautions: Fall;Other (comment) Precaution Comments: watch SpO2, baseline 2-4L Restrictions Weight Bearing Restrictions: Yes     Mobility  Bed Mobility Overal bed mobility: Needs Assistance Bed Mobility: Supine to Sit     Supine to sit: Mod assist, HOB elevated     General  bed mobility comments: HOB 25 degrees with mod assist to pivot legs to EOB and elevate trunk, min assist to scoot fully to EOB    Transfers Overall transfer level: Needs assistance   Transfers: Sit to/from Stand Sit to Stand: Min assist, +2 safety/equipment   Step pivot transfers: Min assist, +2 safety/equipment       General transfer comment: min assist to stand from bed with cues for hand placement and safety. Min assist to pivot bed to chair with RW with +2 for safety and equipment as pt sitting prematurely and needing max multimodal directional cues for sequence    Ambulation/Gait               General Gait Details: unable   Stairs             Wheelchair Mobility    Modified Rankin (Stroke Patients Only)       Balance Overall balance assessment: Needs assistance Sitting-balance support: Bilateral upper extremity supported, Feet supported Sitting balance-Leahy Scale: Poor Sitting balance - Comments: minguard-min assist sitting EOB   Standing balance support: Reliant on assistive device for balance, Bilateral upper extremity supported Standing balance-Leahy Scale: Poor Standing balance comment: bil UE support on RW                            Cognition Arousal/Alertness: Awake/alert Behavior During Therapy: WFL for tasks assessed/performed Overall Cognitive Status: Impaired/Different from baseline Area of Impairment: Orientation, Attention, Memory, Following commands, Safety/judgement, Awareness, Problem solving                 Orientation Level: Disoriented to, Situation, Time Current Attention Level: Sustained Memory: Decreased short-term memory Following Commands: Follows one step commands  with increased time Safety/Judgement: Decreased awareness of safety, Decreased awareness of deficits   Problem Solving: Slow processing, Decreased initiation, Difficulty sequencing, Requires verbal cues General Comments: initially lethargic  with cues to open eyes during conversation. pt oriented to place not date and following commands with increased time        Exercises General Exercises - Lower Extremity Long Arc Quad: AAROM, Both, 15 reps, Seated Hip Flexion/Marching: AAROM, Both, 10 reps, Seated    General Comments        Pertinent Vitals/Pain Pain Assessment Pain Assessment: No/denies pain    Home Living                          Prior Function            PT Goals (current goals can now be found in the care plan section) Progress towards PT goals: Progressing toward goals    Frequency    Min 2X/week      PT Plan Current plan remains appropriate;Frequency needs to be updated    Co-evaluation              AM-PAC PT "6 Clicks" Mobility   Outcome Measure  Help needed turning from your back to your side while in a flat bed without using bedrails?: A Lot Help needed moving from lying on your back to sitting on the side of a flat bed without using bedrails?: A Lot Help needed moving to and from a bed to a chair (including a wheelchair)?: A Lot Help needed standing up from a chair using your arms (e.g., wheelchair or bedside chair)?: A Lot Help needed to walk in hospital room?: Total Help needed climbing 3-5 steps with a railing? : Total 6 Click Score: 10    End of Session Equipment Utilized During Treatment: Oxygen;Gait belt Activity Tolerance: Patient tolerated treatment well Patient left: in chair;with call bell/phone within reach;with chair alarm set Nurse Communication: Mobility status PT Visit Diagnosis: Unsteadiness on feet (R26.81);Other abnormalities of gait and mobility (R26.89);Muscle weakness (generalized) (M62.81)     Time: 3419-6222 PT Time Calculation (min) (ACUTE ONLY): 16 min  Charges:  $Therapeutic Activity: 8-22 mins                     Bayard Males, PT Acute Rehabilitation Services Office: [phone]    Sandy Salaam Marsheila Alejo 04/06/2022, 9:43 AM

## 2022-04-06 NOTE — Discharge Summary (Signed)
Physician Discharge Summary  Valerie Rodgers [id number] DOB: 1950/11/21 DOA: 03/25/2022  PCP: Pcp, No  Admit date: 03/25/2022 Discharge date: 04/06/2022  Admitted From: Home Disposition:  SNF  Recommendations for Outpatient Follow-up:  Follow up with cardiology in 2 weeks, Coreg was held evaluate blood pressure to see if he can be resumed as an outpatient. Please obtain BMP/CBC in one week   Home Health:No Equipment/Devices:none  Discharge Condition:Stable CODE STATUS:Full Diet recommendation: Heart Healthy  Brief/Interim Summary: 71 y.o. 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%, chronic kidney disease stage IIIb, underlying cirrhosis, negative Lexiscan, substance abuse disorder ongoing tobacco and cocaine user, chronic lower extremity edema since March 2023, chronic atrial fibrillation not on anticoagulation with a chads Vascor greater than 4, recently discharged on 03/20/2022 for acute stated heart failure which was diuresed 7 L with IV Lasix and midodrine likely in the setting of cocaine, readmitted again on 03/25/2022 with new tremors leukocytosis of 19 BNP of 2000 hypercarbic on admission started on BiPAP, ammonia level 48, blood cultures were obtained on 03/25/2022 the sugar coccus viridans and Acinetobacter species, surveillance blood cultures on 03/27/2022 negative till date, ID was brought on board, on 03/30/2022 started experiencing delirium of unclear etiology placed on CIWA protocol physician was suspecting withdrawal.  Discharge Diagnoses:  Principal Problem:   Acute on chronic respiratory failure with hypoxia and hypercapnia ([place]) Active Problems:   COPD with acute exacerbation (HCC)   Atrial flutter, paroxysmal (HCC)   Stage 3a chronic kidney disease (CKD) ([place])   Decubitus ulcer of left buttock   HFrEF (heart failure with reduced ejection fraction) (HCC)   Elevated  LFTs   Elevated troponin   Cellulitis   Acute encephalopathy   Severe sepsis without septic shock (HCC)   Bacteremia   Bilateral cellulitis of lower leg  Toxic metabolic encephalopathy: Felt to be a combination of withdrawal from phenobarbital and benzodiazepines and probably hypercarbia. She was started on BiPAP which she tolerated well. EEG was done that showed no evidence of seizures. She has been off opiates and her confusion did resolve. Physical therapy evaluated the patient recommended short-term rehab.  Acute on chronic respiratory failure with hypoxia and hypercarbia: Overnight she is becoming slightly hypoxemic, she will require BiPAP at facility. She is back to her home dose of oxygen.  History of Aerococcus viridans and a Citrobacter bacteremia Infectious disease was curb sided and she completed her course of antibiotics on 03/24/2022 they recommended no further antibiotics.  COPD oxygen dependent: Continue oxygen 2 to 3 L at home.  Elevated troponins: I demand ischemia, it was discussed with Dr. Geanie Logan he recommended work-up as an outpatient UPEP and SPEP were sent cardiac MRI as an outpatient we will follow-up with cardiology as an outpatient.  Chronic systolic and diastolic heart failure: She was restarted on her midodrine her Coreg was held she was continuing Corlanor She will follow-up with cardiology as an outpatient to see if her blood pressure can tolerate her Coreg.  Cardiorenal syndrome: Creatinine improved with diuresis.  Passive congestion of the liver: Likely due to decompensated heart failure elevated alkaline phosphatase and LFTs ultrasound showed choledocholithiasis without cholecystitis. Hepatitis  panel and RPR negative. Liver enzymes improved with diuresis.  Sacral cubitus ulcer stage II present on admission: Noted. Discharge Instructions  Discharge Instructions     Diet - low sodium heart healthy   Complete by: As directed    Discharge  wound care:   Complete by: As directed    As per wound care instructions   Increase activity slowly   Complete by: As directed       Allergies as of 04/06/2022   No Known Allergies      Medication List     STOP taking these medications    carvedilol 3.125 MG tablet Commonly known as: COREG   Farxiga 10 MG Tabs tablet Generic drug: dapagliflozin propanediol       TAKE these medications    atorvastatin 20 MG tablet Commonly known as: LIPITOR Take 1 tablet (20 mg total) by mouth at bedtime.   calcitRIOL 0.25 MCG capsule Commonly known as: ROCALTROL Take 0.25 mcg by mouth daily.   Corlanor 5 MG Tabs tablet Generic drug: ivabradine Take 1 tablet (5 mg total) by mouth 2 (two) times daily with a meal.   furosemide 40 MG tablet Commonly known as: LASIX Take 40 mg by mouth daily.   midodrine 10 MG tablet Commonly known as: PROAMATINE Take 1 tablet (10 mg total) by mouth 2 (two) times daily with a meal.   multivitamin with minerals Tabs tablet Take 1 tablet by mouth daily.   potassium chloride 10 MEQ tablet Commonly known as: KLOR-CON Take 10 mEq by mouth daily.               Durable Medical Equipment  (From admission, onward)           Start     Ordered   04/04/22 1333  For home use only DME Bipap  Once       Question Answer Comment  Length of Need Lifetime   Bleed in oxygen (LPM) 3   Inspiratory pressure 10   Expiratory pressure 5      04/04/22 1332              Discharge Care Instructions  (From admission, onward)           Start     Ordered   04/06/22 0000  Discharge wound care:       Comments: As per wound care instructions   04/06/22 0732            No Known Allergies  Consultations: Cardiology   Procedures/Studies: DG CHEST PORT 1 VIEW  Result Date: 04/04/2022 CLINICAL DATA:  Pneumonia.  Tremors. EXAM: PORTABLE CHEST 1 VIEW COMPARISON:  03/25/2022 FINDINGS: The patient is rotated to the left on today's  radiograph, reducing diagnostic sensitivity and specificity. Indistinct left hemidiaphragm compatible with left lower lobe airspace opacity, mildly worsened from previous. Possible component of atelectasis. Indistinct pulmonary vasculature with mildly increased bilateral interstitial accentuation favoring interstitial edema. Mild cardiomegaly. IMPRESSION: 1. Mildly worsened left lower lobe airspace opacity, potentially from atelectasis or pneumonia. 2. Mild cardiomegaly with interstitial edema. 3. The patient is rotated to the left on today's radiograph, reducing diagnostic sensitivity and specificity. Electronically Signed   By: Van Clines M.D.   On: 04/04/2022 08:19   EEG adult  Result Date: 04/02/2022 Lora Havens, MD     04/02/2022  8:11 AM Patient Name: KHIANNA BROADWELL MRN: [id number] Epilepsy Attending: Lora Havens Referring Physician/Provider: Nita Sells, MD Date: 04/01/2022 Duration: 21.57 mins Patient history: 71yo F with ams. EEG to evaluate fir seizure Level of alertness: lethargic AEDs during EEG study: Phenobarb, Ativan Technical aspects: This EEG study was done with scalp electrodes positioned according to the 10-20 International system of electrode placement. Electrical activity was reviewed with band  pass filter of 1-70Hz , sensitivity of 7 uV/mm, display speed of 65mm/sec with a 60Hz  notched filter applied as appropriate. EEG data were recorded continuously and digitally stored.  Video monitoring was available and reviewed as appropriate. Description:  EEG showed continuous generalized 3 to 6 Hz theta-delta slowing. admixed with an excessive amount of 15 to 18 Hz beta activity distributed symmetrically and diffusely.  Hyperventilation and photic stimulation were not performed.   ABNORMALITY - Continuous slow, generalized - Excessive beta, generalized IMPRESSION: This study is suggestive of moderate diffuse encephalopathy, nonspecific etiology. The excessive beta  activity seen in the background is most likely due to the effect of benzodiazepine and is a benign EEG pattern. No seizures or epileptiform discharges were seen throughout the recording. Lora Havens   ECHOCARDIOGRAM COMPLETE  Result Date: 03/26/2022    ECHOCARDIOGRAM REPORT   Patient Name:   FONNIE CROOKSHANKS Date of Exam: 03/26/2022 Medical Rec #:  [id number]       Height:       64.0 in Accession #:    [id number]      Weight:       178.4 lb Date of Birth:  12/11/1950        BSA:          1.863 m Patient Age:    42 years        BP:           102/74 mmHg Patient Gender: F               HR:           79 bpm. Exam Location:  Inpatient Procedure: 2D Echo, 3D Echo, Color Doppler, Cardiac Doppler and Intracardiac            Opacification Agent Indications:     Elevated Troponin  History:         Patient has prior history of Echocardiogram examinations, most                  recent 11/15/2021. COPD; Arrythmias:Atrial Fibrillation.  Sonographer:     Bernadene Person RDCS Referring Phys:  [id number] Ilene Qua OPYD Diagnosing Phys: Adrian Prows MD IMPRESSIONS  1. 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 Left ventricular ejection fraction, by estimation, is 20 to 25%. Left ventricular ejection fraction by 3D volume is 29 %. The left ventricle has severely decreased function. The left ventricle demonstrates global hypokinesis. Left ventricular diastolic function could not be evaluated. There is the interventricular septum is flattened in systole and diastole, consistent with right ventricular pressure and volume overload.  2. Right ventricular systolic function is normal. The right ventricular size is normal. There is moderately elevated pulmonary artery systolic pressure.  3. Left atrial size was moderately dilated.  4. Right atrial size was moderately dilated.  5. The mitral valve is normal in structure. Moderate mitral valve regurgitation.  6. Tricuspid valve regurgitation is moderate.  7. The aortic valve is  tricuspid. Aortic valve regurgitation is trivial. No aortic stenosis is present.  8. Pulmonic valve regurgitation is moderate. Comparison(s): Compared to 05/302023, EF marginally better from 20% to 25%. 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 Left ventricular ejection fraction, by estimation, is 20 to 25%. Left ventricular ejection fraction by 3D volume is 29 %. The left ventricle has severely decreased function. The left ventricle demonstrates global hypokinesis. Definity contrast agent was given IV to delineate the left ventricular endocardial borders. The left ventricular internal cavity size was normal  in size. There is no left ventricular hypertrophy. The interventricular septum is flattened in systole and diastole, consistent with right ventricular pressure and volume overload. Left ventricular diastolic function could not be evaluated. Right Ventricle: The right ventricular size is normal. No increase in right ventricular wall thickness. Right ventricular systolic function is normal. There is moderately elevated pulmonary artery systolic pressure. The tricuspid regurgitant velocity is 2.69 m/s, and with an assumed right atrial pressure of 15 mmHg, the estimated right ventricular systolic pressure is [id number] mmHg. Left Atrium: Left atrial size was moderately dilated. Right Atrium: Right atrial size was moderately dilated. Pericardium: There is no evidence of pericardial effusion. Mitral Valve: The mitral valve is normal in structure. Moderate mitral valve regurgitation. Tricuspid Valve: The tricuspid valve is normal in structure. Tricuspid valve regurgitation is moderate. Aortic Valve: The aortic valve is tricuspid. Aortic valve regurgitation is trivial. No aortic stenosis is present. Pulmonic Valve: The pulmonic valve was grossly normal. Pulmonic valve regurgitation is moderate. Aorta: The aortic root is normal in size and structure. IAS/Shunts: No atrial level shunt  detected by color flow Doppler.  LEFT VENTRICLE PLAX 2D LVIDd:         6.10 cm LVIDs:         4.70 cm LV PW:         0.90 cm         3D Volume EF LV IVS:        0.80 cm         LV 3D EF:    Left LVOT diam:     2.10 cm                      ventricul LV SV:         84                           ar LV SV Index:   45                           ejection LVOT Area:     3.46 cm                     fraction                                             by 3D                                             volume is LV Volumes (MOD)                            29 %. LV vol d, MOD    201.0 ml A2C: LV vol d, MOD    152.0 ml      3D Volume EF: A4C:                           3D EF:        29 % LV vol s, MOD    133.0 ml      LV  EDV:       220 ml A2C:                           LV ESV:       155 ml LV vol s, MOD    108.0 ml      LV SV:        65 ml A4C: LV SV MOD A2C:   68.0 ml LV SV MOD A4C:   152.0 ml LV SV MOD BP:    59.2 ml RIGHT VENTRICLE TAPSE (M-mode): 1.3 cm LEFT ATRIUM             Index        RIGHT ATRIUM           Index LA diam:        4.20 cm 2.25 cm/m   RA Area:     23.70 cm LA Vol (A2C):   86.8 ml 46.58 ml/m  RA Volume:   81.90 ml  43.95 ml/m LA Vol (A4C):   75.3 ml 40.41 ml/m LA Biplane Vol: 81.4 ml 43.68 ml/m  AORTIC VALVE             PULMONIC VALVE LVOT Vmax:   136.33 cm/s PR End Diast Vel: 8.29 msec LVOT Vmean:  91.133 cm/s LVOT VTI:    0.243 m  AORTA Ao Root diam: 3.10 cm Ao Asc diam:  3.60 cm TRICUSPID VALVE TR Peak grad:   28.9 mmHg TR Vmax:        269.00 cm/s  SHUNTS Systemic VTI:  0.24 m Systemic Diam: 2.10 cm Yates Decamp MD Electronically signed by Yates Decamp MD Signature Date/Time: 03/26/2022/8:41:38 PM    Final    US Abdomen Limited RUQ (LIVER/GB)  Result Date: 03/25/2022 CLINICAL DATA:  Elevated liver function test. EXAM: ULTRASOUND ABDOMEN LIMITED RIGHT UPPER QUADRANT COMPARISON:  None Available. FINDINGS: Gallbladder: No gallstones are visualized. The gallbladder wall measures 3.62 mm in thickness. A  moderate amount of pericholecystic fluid is seen. No sonographic Murphy sign noted by sonographer. Common bile duct: Diameter: 1.63 mm Liver: No focal lesion identified. Within normal limits in parenchymal echogenicity. Portal vein is patent on color Doppler imaging with normal direction of blood flow towards the liver. Other: A trace amount of free fluid is seen along the inferior aspect of the right lobe of the liver. IMPRESSION: 1. Gallbladder wall thickening and pericholecystic fluid without evidence of cholelithiasis or acute cholecystitis. 2. Trace amount of perihepatic free fluid. Electronically Signed   By: Aram Candela M.D.   On: 03/25/2022 22:10   DG Chest Port 1 View  Result Date: 03/25/2022 CLINICAL DATA:  Tremors. EXAM: PORTABLE CHEST 1 VIEW COMPARISON:  March 12, 2022 FINDINGS: Stable moderate to marked severity cardiac silhouette enlargement is noted. Stable, mild to moderate severity diffusely increased interstitial lung markings are seen. Mild atelectasis and/or infiltrate is suspected along the infrahilar region on the left. There is no evidence of a pleural effusion or pneumothorax. The visualized skeletal structures are unremarkable. IMPRESSION: 1. Stable moderate to marked severity cardiac silhouette enlargement. 2. Chronic interstitial lung disease with mild left infrahilar atelectasis and/or infiltrate. Electronically Signed   By: Aram Candela M.D.   On: 03/25/2022 18:54   DG Chest Port 1 View  Result Date: 03/12/2022 CLINICAL DATA:  Leg swelling EXAM: PORTABLE CHEST 1 VIEW COMPARISON:  11/13/2021 FINDINGS: Cardiomegaly with vascular congestion and bilateral airspace disease compatible with edema/CHF. No visible effusions  or acute bony abnormality. IMPRESSION: Mild to moderate CHF Electronically Signed   By: Rolm Baptise M.D.   On: 03/12/2022 19:24   (Echo, Carotid, EGD, Colonoscopy, ERCP)    Subjective: No complaints  Discharge Exam: Vitals:   04/06/22 0313  04/06/22 0330  BP: 91/68   Pulse: 70 72  Resp: (!) 26 20  Temp: 98.4 F (36.9 C)   SpO2:  91%   Vitals:   04/06/22 0000 04/06/22 0313 04/06/22 0330 04/06/22 0605  BP: 111/60 91/68    Pulse: 64 70 72   Resp: (!) 21 (!) 26 20   Temp:  98.4 F (36.9 C)    TempSrc:  Axillary    SpO2:   91%   Weight:    74.4 kg  Height:        General: Pt is alert, awake, not in acute distress Cardiovascular: RRR, S1/S2 +, no rubs, no gallops Respiratory: CTA bilaterally, no wheezing, no rhonchi Abdominal: Soft, NT, ND, bowel sounds + Extremities: no edema, no cyanosis    The results of significant diagnostics from this hospitalization (including imaging, microbiology, ancillary and laboratory) are listed below for reference.     Microbiology: Recent Results (from the past 240 hour(s))  Culture, blood (Routine X 2) w Reflex to ID Panel     Status: None   Collection Time: 03/27/22  7:34 AM   Specimen: BLOOD RIGHT FOREARM  Result Value Ref Range Status   Specimen Description BLOOD RIGHT FOREARM  Final   Special Requests   Final    BOTTLES DRAWN AEROBIC AND ANAEROBIC Blood Culture adequate volume   Culture   Final    NO GROWTH 5 DAYS Performed at [facility], [address], [place], [place] [address]    Report Status 04/01/2022 FINAL  Final  Culture, blood (Routine X 2) w Reflex to ID Panel     Status: None   Collection Time: 03/27/22  7:37 AM   Specimen: BLOOD  Result Value Ref Range Status   Specimen Description BLOOD LEFT ANTECUBITAL  Final   Special Requests   Final    BOTTLES DRAWN AEROBIC AND ANAEROBIC Blood Culture results may not be optimal due to an inadequate volume of blood received in culture bottles   Culture   Final    NO GROWTH 5 DAYS Performed at [facility], [place] [address] [place], [place] [address]    Report Status 04/01/2022 FINAL  Final     Labs: BNP (last 3 results) Recent Labs    11/18/21 0417 03/12/22 1754 03/25/22 1800  BNP 958.7*  3,743.2* [id number]*   Basic Metabolic Panel: Recent Labs  Lab 04/02/22 0037 04/02/22 1025 04/03/22 0424 04/04/22 0714 04/05/22 0025 04/06/22 0023  NA 142 139 143 139 139 141  K 4.3 3.9 3.8 3.7 4.1 3.8  CL 102  --  98 97* 99 99  CO2 33*  --  37* 29 29 36*  GLUCOSE 130*  --  82 128* 106* 124*  BUN 26*  --  18 12 13 14   CREATININE 0.96  --  1.01* 0.72 0.90 1.08*  CALCIUM 8.3*  --  8.7* 8.6* 8.0* 8.3*  MG 1.8  --   --   --   --   --    Liver Function Tests: Recent Labs  Lab 04/02/22 0037 04/03/22 0424 04/04/22 0714 04/05/22 0025 04/06/22 0023  AST 20 22 20 25 25   ALT 18 18 QUANTITY NOT SUFFICIENT, UNABLE TO PERFORM  TEST 15 17  ALKPHOS 83 79 89 105 108  BILITOT 0.8 1.0 QUANTITY NOT SUFFICIENT, UNABLE TO PERFORM TEST 0.8 0.6  PROT 4.7* 4.7* 4.7* 4.9* 4.8*  ALBUMIN 2.2* 2.2* 2.2* 2.2* 2.2*   No results for input(s): "LIPASE", "AMYLASE" in the last 168 hours. No results for input(s): "AMMONIA" in the last 168 hours. CBC: Recent Labs  Lab 04/01/22 1651 04/02/22 0037 04/02/22 1025 04/03/22 0424 04/04/22 0920  WBC 8.4 7.7  --  7.5 8.3  NEUTROABS 7.5  --   --   --   --   HGB 11.8* 11.6* 13.3 12.5 14.1  HCT 36.9 35.0* 39.0 39.3 45.3  MCV 82.9 81.6  --  84.0 85.0  PLT 137* 149*  --  154 170   Cardiac Enzymes: No results for input(s): "CKTOTAL", "CKMB", "CKMBINDEX", "TROPONINI" in the last 168 hours. BNP: Invalid input(s): "POCBNP" CBG: Recent Labs  Lab 03/31/22 2320 04/01/22 0330 04/01/22 0854 04/01/22 1135 04/01/22 1548  GLUCAP 92 98 106* 98 100*   D-Dimer No results for input(s): "DDIMER" in the last 72 hours. Hgb A1c No results for input(s): "HGBA1C" in the last 72 hours. Lipid Profile No results for input(s): "CHOL", "HDL", "LDLCALC", "TRIG", "CHOLHDL", "LDLDIRECT" in the last 72 hours. Thyroid function studies No results for input(s): "TSH", "T4TOTAL", "T3FREE", "THYROIDAB" in the last 72 hours.  Invalid input(s): "FREET3" Anemia work up No results  for input(s): "VITAMINB12", "FOLATE", "FERRITIN", "TIBC", "IRON", "RETICCTPCT" in the last 72 hours. Urinalysis    Component Value Date/Time   COLORURINE AMBER (A) 03/25/2022 0127   APPEARANCEUR CLEAR 03/25/2022 0127   LABSPEC 1.023 03/25/2022 0127   PHURINE 5.0 03/25/2022 0127   GLUCOSEU NEGATIVE 03/25/2022 0127   HGBUR NEGATIVE 03/25/2022 0127   BILIRUBINUR NEGATIVE 03/25/2022 0127   KETONESUR NEGATIVE 03/25/2022 0127   PROTEINUR 100 (A) 03/25/2022 0127   NITRITE NEGATIVE 03/25/2022 0127   LEUKOCYTESUR NEGATIVE 03/25/2022 0127   Sepsis Labs Recent Labs  Lab 04/01/22 1651 04/02/22 0037 04/03/22 0424 04/04/22 0920  WBC 8.4 7.7 7.5 8.3   Microbiology Recent Results (from the past 240 hour(s))  Culture, blood (Routine X 2) w Reflex to ID Panel     Status: None   Collection Time: 03/27/22  7:34 AM   Specimen: BLOOD RIGHT FOREARM  Result Value Ref Range Status   Specimen Description BLOOD RIGHT FOREARM  Final   Special Requests   Final    BOTTLES DRAWN AEROBIC AND ANAEROBIC Blood Culture adequate volume   Culture   Final    NO GROWTH 5 DAYS Performed at [facility], [address], [place], [place] [address]    Report Status 04/01/2022 FINAL  Final  Culture, blood (Routine X 2) w Reflex to ID Panel     Status: None   Collection Time: 03/27/22  7:37 AM   Specimen: BLOOD  Result Value Ref Range Status   Specimen Description BLOOD LEFT ANTECUBITAL  Final   Special Requests   Final    BOTTLES DRAWN AEROBIC AND ANAEROBIC Blood Culture results may not be optimal due to an inadequate volume of blood received in culture bottles   Culture   Final    NO GROWTH 5 DAYS Performed at [facility], [place] [address] [place], [place] [address]    Report Status 04/01/2022 FINAL  Final     SIGNED:   Charlynne Cousins, MD  [facility] 04/06/2022, 7:32 AM Pager   If 7PM-7AM, please contact night-coverage [email or web address]  Password [email or web address]

## 2022-04-06 NOTE — Progress Notes (Signed)
Placed pt on BiPAP x2 which pt subsequently removed shortly after.

## 2022-04-06 NOTE — Progress Notes (Signed)
Mobility Specialist Progress Note    04/06/22 1148  Mobility  Activity Ambulated with assistance in room  Level of Assistance +2 (takes two people)  Assistive Device Other (Comment) (HHA)  Distance Ambulated (ft) 4 ft  Activity Response Tolerated fair  Mobility Referral Yes  $Mobility charge 1 Mobility   Pt in chair needing to get to stretcher for PTAR. No complaints. Left with PTAR present.   Hildred Alamin Mobility Specialist  Secure Chat Only

## 2022-04-06 NOTE — Progress Notes (Signed)
  X-cover Note: Received page from RN that pt is agitated. Removing telemetry leads and supplemental O2. Will give 5 mg IV haldol   Kristopher Oppenheim, DO [facility]

## 2022-05-02 ENCOUNTER — Ambulatory Visit: Payer: Medicare Other

## 2022-05-02 VITALS — BP 116/80 | HR 56 | Ht 64.0 in | Wt 143.6 lb

## 2022-05-02 DIAGNOSIS — I5042 Chronic combined systolic (congestive) and diastolic (congestive) heart failure: Secondary | ICD-10-CM

## 2022-05-02 DIAGNOSIS — J9610 Chronic respiratory failure, unspecified whether with hypoxia or hypercapnia: Secondary | ICD-10-CM

## 2022-05-02 DIAGNOSIS — I498 Other specified cardiac arrhythmias: Secondary | ICD-10-CM

## 2022-05-02 DIAGNOSIS — I10 Essential (primary) hypertension: Secondary | ICD-10-CM

## 2022-05-02 NOTE — Progress Notes (Signed)
Primary Physician/Referring:  Pcp, No  Patient ID: Valerie Rodgers, female    DOB: 1951/01/30, 71 y.o.   MRN: [id number]  Chief Complaint  Patient presents with   Chronic combined systolic and diastolic heart failure   Follow-up   HPI:    Valerie Rodgers  is a 71 y.o. hypertension, COPD, bipolar disorder, recent cocaine abuse, admitted with acute hypoxic respiratory failure, congestive heart failure, cocaine positive on admission 07/17/2021.   Patient was admitted to [facility] 07/18/2021-07/23/2021 with acute on chronic heart failure.  Patient was diuresed and started on Entresto, Corlanor, and Lasix.  Echocardiogram revealed LVEF 25-30% and grade 3 diastolic dysfunction.  Patient was seen in our office 08/18/2021 at which time spironolactone 12.5 mg daily was added for better blood pressure control.  She presents today for follow-up.  She is currently at [facility] facility and has not used cocaine or marijuana in about 3 weeks. Repeat labs showed stable renal function.  Overall, she is feeling well without complaints of chest pain, dyspnea, orthopnea, or leg edema.   Past Medical History:  Diagnosis Date   Arthritis    Bipolar affective disorder (HCC)    Cataract    Past Surgical History:  Procedure Laterality Date   ABDOMINAL HYSTERECTOMY     Family History  Problem Relation Age of Onset   Stroke Father    Colon cancer Neg Hx    Heart disease Neg Hx     Social History   Tobacco Use   Smoking status: Some Days    Packs/day: 0.25    Years: 65.00    Total pack years: 16.25    Types: Cigarettes   Smokeless tobacco: Never  Substance Use Topics   Alcohol use: Yes    Comment: 1 pint weekly   Marital Status: Single   ROS  Review of Systems  Cardiovascular:  Negative for chest pain, claudication, leg swelling, near-syncope, orthopnea, palpitations, paroxysmal nocturnal dyspnea and syncope.  Respiratory:  Negative for shortness of breath.    Neurological:  Negative for dizziness.    Objective  Blood pressure 116/80, pulse (!) 56, height 5\' 4"  (1.626 m), weight 143 lb 9.6 oz (65.1 kg), SpO2 96 %.     05/02/2022    1:44 PM 04/06/2022    7:55 AM 04/06/2022    6:05 AM  Vitals with BMI  Height 5\' 4"     Weight 143 lbs 10 oz  164 lbs  BMI 24.64  28.14  Systolic 116 92   Diastolic 80 53   Pulse 56 62     Physical Exam Vitals reviewed.  Cardiovascular:     Rate and Rhythm: Normal rate and regular rhythm.     Pulses: Intact distal pulses.     Heart sounds: S1 normal and S2 normal. No murmur heard.    No gallop.  Pulmonary:     Effort: Pulmonary effort is normal. No respiratory distress.     Breath sounds: No wheezing, rhonchi or rales.  Musculoskeletal:     Right lower leg: No edema.     Left lower leg: No edema.    Laboratory examination:   CrCl cannot be calculated (Patient's most recent lab result is older than the maximum 21 days allowed.).     Latest Ref Rng & Units 04/06/2022   12:23 AM 04/05/2022   12:25 AM 04/04/2022    7:14 AM  CMP  Glucose 70 - 99 mg/dL 04/07/2022  04/06/2022  572  BUN 8 - 23 mg/dL 14  13  12    Creatinine 0.44 - 1.00 mg/dL  1.66  0.63   Sodium 135 - 145 mmol/L 141  139  139   Potassium 3.5 - 5.1 mmol/L 3.8  4.1  3.7   Chloride 98 - 111 mmol/L 99  99  97   CO2 22 - 32 mmol/L 36  29  29   Calcium 8.9 - 10.3 mg/dL 8.3  8.0  8.6   Total Protein 6.5 - 8.1 g/dL 4.8  4.9  4.7   Total Bilirubin 0.3 - 1.2 mg/dL 0.6  0.8  QUANTITY NOT SUFFICIENT, UNABLE TO PERFORM TEST   Alkaline Phos 38 - 126 U/L 108  105  89   AST 15 - 41 U/L 25  25  20    ALT 0 - 44 U/L 17  15  QUANTITY NOT SUFFICIENT, UNABLE TO PERFORM TEST       Latest Ref Rng & Units 04/04/2022    9:20 AM 04/03/2022    4:24 AM 04/02/2022   10:25 AM  CBC  WBC 4.0 - 10.5 K/uL 8.3  7.5    Hemoglobin 12.0 - 15.0 g/dL 04/05/2022  04/04/2022  01.0   Hematocrit 36.0 - 46.0 % 45.3  39.3  39.0   Platelets 150 - 400 K/uL 170  154      Lipid  Panel Recent Labs    11/18/21 0417  CHOL 109  TRIG 49  LDLCALC 55  VLDL 10  HDL 44  CHOLHDL 2.5  LDLDIRECT 47.3    HEMOGLOBIN A1C Lab Results  Component Value Date   HGBA1C 6.0 (H) 11/16/2021   MPG 125.5 11/16/2021   TSH Recent Labs    11/13/21 1843  TSH 3.570    External labs:   None   Allergies  No Known Allergies   Medications Prior to Visit:   Outpatient Medications Prior to Visit  Medication Sig Dispense Refill   atorvastatin (LIPITOR) 20 MG tablet Take 1 tablet (20 mg total) by mouth at bedtime. 30 tablet 0   calcitRIOL (ROCALTROL) 0.25 MCG capsule Take 0.25 mcg by mouth daily.     furosemide (LASIX) 40 MG tablet Take 40 mg by mouth daily.     ivabradine (CORLANOR) 5 MG TABS tablet Take 1 tablet (5 mg total) by mouth 2 (two) times daily with a meal. 60 tablet 0   midodrine (PROAMATINE) 10 MG tablet Take 1 tablet (10 mg total) by mouth 2 (two) times daily with a meal. 60 tablet 0   Multiple Vitamin (MULTIVITAMIN WITH MINERALS) TABS tablet Take 1 tablet by mouth daily. 30 tablet 3   potassium chloride (KLOR-CON) 10 MEQ tablet Take 10 mEq by mouth daily.     No facility-administered medications prior to visit.   Final Medications at End of Visit    Current Meds  Medication Sig   atorvastatin (LIPITOR) 20 MG tablet Take 1 tablet (20 mg total) by mouth at bedtime.   calcitRIOL (ROCALTROL) 0.25 MCG capsule Take 0.25 mcg by mouth daily.   furosemide (LASIX) 40 MG tablet Take 40 mg by mouth daily.   ivabradine (CORLANOR) 5 MG TABS tablet Take 1 tablet (5 mg total) by mouth 2 (two) times daily with a meal.   midodrine (PROAMATINE) 10 MG tablet Take 1 tablet (10 mg total) by mouth 2 (two) times daily with a meal.   Multiple Vitamin (MULTIVITAMIN WITH MINERALS) TABS tablet Take 1 tablet by mouth daily.   potassium  chloride (KLOR-CON) 10 MEQ tablet Take 10 mEq by mouth daily.   Radiology:   No results found.  Cardiac Studies:   Nuclear stress test  07/21/2021: 1. Negative for ischemia. Fixed small anterior and moderate inferolateral defects: attenuation versus scar. 2. Global hypokinesis with marked LVE. 3. Left ventricular ejection fraction 21% 4. Non invasive risk stratification*: High   Echocardiogram 07/18/2021:  1. Left ventricular ejection fraction, by estimation, is 25 to 30%. The  left ventricle has severely decreased function. The left ventricle  demonstrates global hypokinesis. Left ventricular diastolic parameters are  consistent with Grade III diastolic dysfunction (restrictive).   2. Right ventricular systolic function is normal. The right ventricular  size is normal.   3. Left atrial size was severely dilated.   4. The mitral valve is normal in structure. Trivial mitral valve  regurgitation. No evidence of mitral stenosis.   5. The aortic valve is normal in structure. Aortic valve regurgitation is  not visualized. No aortic stenosis is present.   6. The inferior vena cava is normal in size with greater than 50%  respiratory variability, suggesting right atrial pressure of 3 mmHg.  Personally reviewed. LV appears mildly dilated.   EKG:   EKG 05/02/2022: Normal sinus rhythm with first-degree AV block at rate of 91 bpm.  Frequent PVCs.  Normal axis.  Low voltage complexes, possible pulmonary disease.  Diffuse nonspecific T wave abnormality.  Cannot exclude old anteroseptal infarct.  Compared to previous EKG on 08/04/2021 sinus rhythm replaces wandering atrial pacemaker rhythm.  Assessment     ICD-10-CM   1. Chronic combined systolic and diastolic heart failure (HCC)  H41.74 EKG 12-Lead    2. Chronic respiratory failure, unspecified whether with hypoxia or hypercapnia (HCC)  J96.10     3. Primary hypertension  I10     4. 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   No orders of the defined types were placed in this encounter.   Recommendations:    Valerie Rodgers is a 71 y.o. hypertension, COPD, bipolar disorder, recent cocaine abuse, admitted with acute hypoxic respiratory failure, congestive heart failure, cocaine positive on admission 07/17/2021.   Chronic combined systolic and diastolic heart failure:  Patient is feeling well overall without clinical evidence of acute heart failure.  We will continue Corlanor and Lasix.  Heart rate is now well controlled. We will continue to avoid initiation of beta-blocker therapy given cocaine use. She is currently in rehab at this time and not using cocaine.  Again reiterated the importance of low-sodium diet as well as medication compliance. Reiterated the importance of complete cessation of cocaine and alcohol use. Patient feels that she will be able to  completely quit the use of drugs once she is released from rehab.  Chronic hypoxic respiratory failure: Likely secondary to chronic systolic and diastolic heart failure with LVEF 25-30% as well as underlying COPD. COPD management by PCP.  Wandering atrial pacemaker: Noted on EKG 07/04/2021 Continue Corlanor.  Hypertension:  Blood pressure is well controlled Continue current medications without changes.  Discussed at length with patient regarding the importance of diet and lifestyle modifications including complete cessation of illicit drug use.  Patient is at high risk for hospitalizations given drug use as well as difficulty with medication compliance.   Follow up in 3 months or sooner if needed.   Nori Riis, AGNP-C 05/02/2022, 2:22 PM Office: [phone] Pager: [phone]

## 2022-07-16 ENCOUNTER — Inpatient Hospital Stay (HOSPITAL_COMMUNITY): Payer: 59

## 2022-07-16 ENCOUNTER — Inpatient Hospital Stay (HOSPITAL_COMMUNITY)
Admission: EM | Admit: 2022-07-16 | Discharge: 2022-07-26 | DRG: 291 | Disposition: A | Discharge status: Skilled Nursing Facility | Payer: 59 | Source: Skilled Nursing Facility | Attending: Internal Medicine | Admitting: Internal Medicine

## 2022-07-16 ENCOUNTER — Emergency Department (HOSPITAL_COMMUNITY): Payer: 59

## 2022-07-16 DIAGNOSIS — Z515 Encounter for palliative care: Secondary | ICD-10-CM

## 2022-07-16 DIAGNOSIS — E876 Hypokalemia: Secondary | ICD-10-CM | POA: Diagnosis present

## 2022-07-16 DIAGNOSIS — E440 Moderate protein-calorie malnutrition: Secondary | ICD-10-CM | POA: Diagnosis present

## 2022-07-16 DIAGNOSIS — Z7401 Bed confinement status: Secondary | ICD-10-CM

## 2022-07-16 DIAGNOSIS — N1832 Chronic kidney disease, stage 3b: Secondary | ICD-10-CM | POA: Diagnosis present

## 2022-07-16 DIAGNOSIS — F17210 Nicotine dependence, cigarettes, uncomplicated: Secondary | ICD-10-CM | POA: Diagnosis present

## 2022-07-16 DIAGNOSIS — N1831 Chronic kidney disease, stage 3a: Secondary | ICD-10-CM | POA: Diagnosis not present

## 2022-07-16 DIAGNOSIS — J441 Chronic obstructive pulmonary disease with (acute) exacerbation: Secondary | ICD-10-CM | POA: Diagnosis not present

## 2022-07-16 DIAGNOSIS — I509 Heart failure, unspecified: Secondary | ICD-10-CM | POA: Diagnosis present

## 2022-07-16 DIAGNOSIS — I10 Essential (primary) hypertension: Secondary | ICD-10-CM | POA: Diagnosis not present

## 2022-07-16 DIAGNOSIS — R4189 Other symptoms and signs involving cognitive functions and awareness: Secondary | ICD-10-CM | POA: Diagnosis present

## 2022-07-16 DIAGNOSIS — I5023 Acute on chronic systolic (congestive) heart failure: Secondary | ICD-10-CM | POA: Diagnosis not present

## 2022-07-16 DIAGNOSIS — K7460 Unspecified cirrhosis of liver: Secondary | ICD-10-CM | POA: Diagnosis present

## 2022-07-16 DIAGNOSIS — I130 Hypertensive heart and chronic kidney disease with heart failure and stage 1 through stage 4 chronic kidney disease, or unspecified chronic kidney disease: Principal | ICD-10-CM | POA: Diagnosis present

## 2022-07-16 DIAGNOSIS — Z823 Family history of stroke: Secondary | ICD-10-CM

## 2022-07-16 DIAGNOSIS — Z6823 Body mass index (BMI) 23.0-23.9, adult: Secondary | ICD-10-CM

## 2022-07-16 DIAGNOSIS — I480 Paroxysmal atrial fibrillation: Secondary | ICD-10-CM | POA: Diagnosis present

## 2022-07-16 DIAGNOSIS — Z79899 Other long term (current) drug therapy: Secondary | ICD-10-CM | POA: Diagnosis not present

## 2022-07-16 DIAGNOSIS — I4892 Unspecified atrial flutter: Secondary | ICD-10-CM | POA: Diagnosis present

## 2022-07-16 DIAGNOSIS — J9621 Acute and chronic respiratory failure with hypoxia: Secondary | ICD-10-CM | POA: Diagnosis present

## 2022-07-16 DIAGNOSIS — N179 Acute kidney failure, unspecified: Secondary | ICD-10-CM | POA: Diagnosis present

## 2022-07-16 DIAGNOSIS — F0393 Unspecified dementia, unspecified severity, with mood disturbance: Secondary | ICD-10-CM | POA: Diagnosis present

## 2022-07-16 DIAGNOSIS — Z638 Other specified problems related to primary support group: Secondary | ICD-10-CM | POA: Diagnosis not present

## 2022-07-16 DIAGNOSIS — E785 Hyperlipidemia, unspecified: Secondary | ICD-10-CM | POA: Diagnosis present

## 2022-07-16 DIAGNOSIS — F1410 Cocaine abuse, uncomplicated: Secondary | ICD-10-CM | POA: Diagnosis present

## 2022-07-16 DIAGNOSIS — J449 Chronic obstructive pulmonary disease, unspecified: Secondary | ICD-10-CM | POA: Diagnosis present

## 2022-07-16 DIAGNOSIS — F319 Bipolar disorder, unspecified: Secondary | ICD-10-CM | POA: Diagnosis present

## 2022-07-16 DIAGNOSIS — Z66 Do not resuscitate: Secondary | ICD-10-CM | POA: Diagnosis present

## 2022-07-16 DIAGNOSIS — I493 Ventricular premature depolarization: Secondary | ICD-10-CM | POA: Diagnosis present

## 2022-07-16 DIAGNOSIS — I5043 Acute on chronic combined systolic (congestive) and diastolic (congestive) heart failure: Secondary | ICD-10-CM | POA: Diagnosis present

## 2022-07-16 DIAGNOSIS — J9622 Acute and chronic respiratory failure with hypercapnia: Secondary | ICD-10-CM | POA: Diagnosis present

## 2022-07-16 DIAGNOSIS — I5020 Unspecified systolic (congestive) heart failure: Secondary | ICD-10-CM | POA: Diagnosis present

## 2022-07-16 DIAGNOSIS — Z1152 Encounter for screening for COVID-19: Secondary | ICD-10-CM

## 2022-07-16 DIAGNOSIS — R188 Other ascites: Secondary | ICD-10-CM | POA: Diagnosis present

## 2022-07-16 DIAGNOSIS — R946 Abnormal results of thyroid function studies: Secondary | ICD-10-CM | POA: Diagnosis present

## 2022-07-16 DIAGNOSIS — R531 Weakness: Secondary | ICD-10-CM | POA: Diagnosis not present

## 2022-07-16 LAB — I-STAT ARTERIAL BLOOD GAS, ED
Acid-Base Excess: 0 mmol/L (ref 0.0–2.0)
Bicarbonate: 26.3 mmol/L (ref 20.0–28.0)
Calcium, Ion: 1.29 mmol/L (ref 1.15–1.40)
HCT: 42 % (ref 36.0–46.0)
Hemoglobin: 14.3 g/dL (ref 12.0–15.0)
O2 Saturation: 92 %
Patient temperature: 36.3
Potassium: 3.9 mmol/L (ref 3.5–5.1)
Sodium: 144 mmol/L (ref 135–145)
TCO2: 28 mmol/L (ref 22–32)
pCO2 arterial: 46.8 mmHg (ref 32–48)
pH, Arterial: 7.354 (ref 7.35–7.45)
pO2, Arterial: 64 mmHg — ABNORMAL LOW (ref 83–108)

## 2022-07-16 LAB — CBC WITH DIFFERENTIAL/PLATELET
Abs Immature Granulocytes: 0.02 10*3/uL (ref 0.00–0.07)
Basophils Absolute: 0 10*3/uL (ref 0.0–0.1)
Basophils Relative: 0 %
Eosinophils Absolute: 0 10*3/uL (ref 0.0–0.5)
Eosinophils Relative: 1 %
HCT: 40.6 % (ref 36.0–46.0)
Hemoglobin: 13.1 g/dL (ref 12.0–15.0)
Immature Granulocytes: 0 %
Lymphocytes Relative: 14 %
Lymphs Abs: 0.8 10*3/uL (ref 0.7–4.0)
MCH: 27.7 pg (ref 26.0–34.0)
MCHC: 32.3 g/dL (ref 30.0–36.0)
MCV: 85.8 fL (ref 80.0–100.0)
Monocytes Absolute: 0.7 10*3/uL (ref 0.1–1.0)
Monocytes Relative: 12 %
Neutro Abs: 4.1 10*3/uL (ref 1.7–7.7)
Neutrophils Relative %: 73 %
Platelets: 131 10*3/uL — ABNORMAL LOW (ref 150–400)
RBC: 4.73 MIL/uL (ref 3.87–5.11)
RDW: 20.3 % — ABNORMAL HIGH (ref 11.5–15.5)
WBC: 5.6 10*3/uL (ref 4.0–10.5)
nRBC: 1.6 % — ABNORMAL HIGH (ref 0.0–0.2)

## 2022-07-16 LAB — URINALYSIS, ROUTINE W REFLEX MICROSCOPIC
Bilirubin Urine: NEGATIVE
Glucose, UA: NEGATIVE mg/dL
Ketones, ur: NEGATIVE mg/dL
Nitrite: NEGATIVE
Protein, ur: NEGATIVE mg/dL
Specific Gravity, Urine: 1.006 (ref 1.005–1.030)
pH: 5 (ref 5.0–8.0)

## 2022-07-16 LAB — HEPATIC FUNCTION PANEL
ALT: 54 U/L — ABNORMAL HIGH (ref 0–44)
AST: 93 U/L — ABNORMAL HIGH (ref 15–41)
Albumin: 3.6 g/dL (ref 3.5–5.0)
Alkaline Phosphatase: 188 U/L — ABNORMAL HIGH (ref 38–126)
Bilirubin, Direct: 0.6 mg/dL — ABNORMAL HIGH (ref 0.0–0.2)
Indirect Bilirubin: 0.8 mg/dL (ref 0.3–0.9)
Total Bilirubin: 1.4 mg/dL — ABNORMAL HIGH (ref 0.3–1.2)
Total Protein: 6.9 g/dL (ref 6.5–8.1)

## 2022-07-16 LAB — BASIC METABOLIC PANEL
Anion gap: 10 (ref 5–15)
BUN: 51 mg/dL — ABNORMAL HIGH (ref 8–23)
CO2: 27 mmol/L (ref 22–32)
Calcium: 9.3 mg/dL (ref 8.9–10.3)
Chloride: 105 mmol/L (ref 98–111)
Creatinine, Ser: 1.82 mg/dL — ABNORMAL HIGH (ref 0.44–1.00)
GFR, Estimated: 29 mL/min — ABNORMAL LOW (ref >60)
Glucose, Bld: 103 mg/dL — ABNORMAL HIGH (ref 70–99)
Potassium: 4.2 mmol/L (ref 3.5–5.1)
Sodium: 142 mmol/L (ref 135–145)

## 2022-07-16 LAB — PROTIME-INR
INR: 1.4 — ABNORMAL HIGH (ref 0.8–1.2)
Prothrombin Time: 16.7 seconds — ABNORMAL HIGH (ref 11.4–15.2)

## 2022-07-16 LAB — TROPONIN I (HIGH SENSITIVITY)
Troponin I (High Sensitivity): 152 ng/L (ref <18)
Troponin I (High Sensitivity): 148 ng/L (ref <18)

## 2022-07-16 LAB — RAPID URINE DRUG SCREEN, HOSP PERFORMED
Amphetamines: NOT DETECTED
Barbiturates: NOT DETECTED
Benzodiazepines: NOT DETECTED
Cocaine: NOT DETECTED
Opiates: NOT DETECTED
Tetrahydrocannabinol: NOT DETECTED

## 2022-07-16 LAB — RESP PANEL BY RT-PCR (RSV, FLU A&B, COVID)  RVPGX2
Influenza A by PCR: NEGATIVE
Influenza B by PCR: NEGATIVE
Resp Syncytial Virus by PCR: NEGATIVE
SARS Coronavirus 2 by RT PCR: NEGATIVE

## 2022-07-16 LAB — BRAIN NATRIURETIC PEPTIDE: B Natriuretic Peptide: >4500 pg/mL — ABNORMAL HIGH (ref 0.0–100.0)

## 2022-07-16 LAB — LACTIC ACID, PLASMA
Lactic Acid, Venous: 2.5 mmol/L (ref 0.5–1.9)
Lactic Acid, Venous: 2.2 mmol/L (ref 0.5–1.9)

## 2022-07-16 LAB — HIV ANTIBODY (ROUTINE TESTING W REFLEX): HIV Screen 4th Generation wRfx: NONREACTIVE

## 2022-07-16 LAB — LIPASE, BLOOD: Lipase: 37 U/L (ref 11–51)

## 2022-07-16 LAB — AMMONIA: Ammonia: 23 umol/L (ref 9–35)

## 2022-07-16 MED ORDER — IPRATROPIUM-ALBUTEROL 0.5-2.5 (3) MG/3ML IN SOLN
3.0000 mL | Freq: Once | RESPIRATORY_TRACT | Status: AC
  Administered 2022-07-16: 3 mL via RESPIRATORY_TRACT
  Filled 2022-07-16: qty 3

## 2022-07-16 MED ORDER — IPRATROPIUM-ALBUTEROL 0.5-2.5 (3) MG/3ML IN SOLN: 3.0000 mL | Freq: Four times a day (QID) | RESPIRATORY_TRACT | Status: DC

## 2022-07-16 MED ORDER — ONDANSETRON HCL 4 MG/2ML IJ SOLN: 4.0000 mg | Freq: Four times a day (QID) | INTRAMUSCULAR | Status: DC | PRN

## 2022-07-16 MED ORDER — HEPARIN SODIUM (PORCINE) 5000 UNIT/ML IJ SOLN
5000.0000 [IU] | Freq: Two times a day (BID) | INTRAMUSCULAR | Status: DC
  Administered 2022-07-16 – 2022-07-26 (×20): 5000 [IU] via SUBCUTANEOUS
  Filled 2022-07-16 (×20): qty 1

## 2022-07-16 MED ORDER — MOMETASONE FURO-FORMOTEROL FUM 200-5 MCG/ACT IN AERO
2.0000 | INHALATION_SPRAY | Freq: Two times a day (BID) | RESPIRATORY_TRACT | Status: DC
  Administered 2022-07-18 – 2022-07-26 (×16): 2 via RESPIRATORY_TRACT
  Filled 2022-07-16 (×2): qty 8.8

## 2022-07-16 MED ORDER — LACTULOSE 10 GM/15ML PO SOLN
30.0000 g | Freq: Two times a day (BID) | ORAL | Status: DC | PRN
  Administered 2022-07-23: 30 g via ORAL
  Filled 2022-07-16: qty 45

## 2022-07-16 MED ORDER — SODIUM CHLORIDE 0.9 % IV SOLN: 250.0000 mL | INTRAVENOUS | Status: DC | PRN

## 2022-07-16 MED ORDER — BUDESONIDE 0.5 MG/2ML IN SUSP
2.0000 mg | Freq: Two times a day (BID) | RESPIRATORY_TRACT | Status: DC
  Administered 2022-07-17: 2 mg via RESPIRATORY_TRACT
  Filled 2022-07-16: qty 8

## 2022-07-16 MED ORDER — SODIUM CHLORIDE 0.9% FLUSH
3.0000 mL | Freq: Two times a day (BID) | INTRAVENOUS | Status: DC
  Administered 2022-07-16 – 2022-07-26 (×20): 3 mL via INTRAVENOUS

## 2022-07-16 MED ORDER — IPRATROPIUM-ALBUTEROL 0.5-2.5 (3) MG/3ML IN SOLN
3.0000 mL | Freq: Four times a day (QID) | RESPIRATORY_TRACT | Status: DC
  Administered 2022-07-17: 3 mL via RESPIRATORY_TRACT
  Filled 2022-07-16 (×2): qty 3

## 2022-07-16 MED ORDER — FUROSEMIDE 10 MG/ML IJ SOLN
40.0000 mg | Freq: Two times a day (BID) | INTRAMUSCULAR | Status: DC
  Administered 2022-07-16 – 2022-07-17 (×2): 40 mg via INTRAVENOUS
  Filled 2022-07-16 (×2): qty 4

## 2022-07-16 MED ORDER — ACETAMINOPHEN 325 MG PO TABS
650.0000 mg | ORAL_TABLET | ORAL | Status: DC | PRN
  Administered 2022-07-18 – 2022-07-25 (×3): 650 mg via ORAL
  Filled 2022-07-16 (×3): qty 2

## 2022-07-16 MED ORDER — METHYLPREDNISOLONE SODIUM SUCC 125 MG IJ SOLR
125.0000 mg | Freq: Once | INTRAMUSCULAR | Status: AC
  Administered 2022-07-16: 125 mg via INTRAVENOUS
  Filled 2022-07-16: qty 2

## 2022-07-16 MED ORDER — MIDODRINE HCL 5 MG PO TABS
10.0000 mg | ORAL_TABLET | Freq: Two times a day (BID) | ORAL | Status: DC
  Administered 2022-07-16 – 2022-07-17 (×2): 10 mg via ORAL
  Filled 2022-07-16 (×2): qty 2

## 2022-07-16 MED ORDER — ALBUTEROL SULFATE (2.5 MG/3ML) 0.083% IN NEBU: 2.5000 mg | INHALATION_SOLUTION | RESPIRATORY_TRACT | Status: DC | PRN

## 2022-07-16 MED ORDER — FUROSEMIDE 10 MG/ML IJ SOLN
40.0000 mg | Freq: Once | INTRAMUSCULAR | Status: AC
  Administered 2022-07-16: 40 mg via INTRAVENOUS
  Filled 2022-07-16: qty 4

## 2022-07-16 MED ORDER — SODIUM CHLORIDE 0.9% FLUSH: 3.0000 mL | INTRAVENOUS | Status: DC | PRN

## 2022-07-16 MED ORDER — HYDRALAZINE HCL 20 MG/ML IJ SOLN: 5.0000 mg | Freq: Four times a day (QID) | INTRAMUSCULAR | Status: DC | PRN

## 2022-07-16 MED ORDER — PREDNISONE 20 MG PO TABS
40.0000 mg | ORAL_TABLET | Freq: Every day | ORAL | Status: DC
  Administered 2022-07-17: 40 mg via ORAL
  Filled 2022-07-16: qty 2

## 2022-07-16 MED ORDER — MAGNESIUM SULFATE 2 GM/50ML IV SOLN
2.0000 g | Freq: Once | INTRAVENOUS | Status: AC
  Administered 2022-07-16: 2 g via INTRAVENOUS
  Filled 2022-07-16: qty 50

## 2022-07-16 NOTE — H&P (Signed)
History and Physical    Valerie Rodgers LPF:[id number] DOB: May 12, 1951 DOA: 07/16/2022  PCP: Pcp, No (Confirm with patient/family/NH records and if not entered, this has to be entered at [facility] point of entry) Patient coming from: SNF  I have personally briefly reviewed patient's old medical records in August  Chief Complaint: SOB, leg and belly swelling  HPI: Valerie Rodgers is a 72 y.o. female with medical history significant of COPD, with chronic respiratory failure on 2 L continuously, chronic combined HFrEF and HFpEF with LVEF 20%, CKD stage IIIb, cirrhosis, history of cocaine abuse and tobacco abuse, PAF not on anticoagulation's, brought in from nursing home for evaluation of worsening of hypoxia and leg swelling.  Symptoms started 3 to 4 days ago patient started to have increasing shortness of breath despite increasing oxygen and nursing home, dry cough, and meantime she also started noticed swelling of her legs and abdominal.  Denies any chest pain no fever or chills.  EMS arrived this morning and found patient in wheezing and was given breathing treatment x 2 and patient was placed on BiPAP to relieve breathing effort.  ED Course: Afebrile, borderline bradycardic, blood pressure WNL, chest x-ray showed pulmonary congestion with cardiomegaly, blood work showed AKI creatinine 1.8 compared to baseline 1.0 BUN 51, lactic acid 2.5, WBC 5.6.  Patient was given breathing treatment, including IV Solu-Medrol magnesium, 40 mg IV Lasix x 1, and patient was able to be weaned off BIPAP in ED.  Review of Systems: As per HPI otherwise 14 point review of systems negative.    Past Medical History:  Diagnosis Date   Arthritis    Bipolar affective disorder ([place])    Cataract     Past Surgical History:  Procedure Laterality Date   ABDOMINAL HYSTERECTOMY       reports that she has been smoking cigarettes. She has a 16.25 pack-year smoking history. She has never used smokeless tobacco. She  reports current alcohol use. She reports current drug use. Drugs: Cocaine and Marijuana.  No Known Allergies  Family History  Problem Relation Age of Onset   Stroke Father    Colon cancer Neg Hx    Heart disease Neg Hx      Prior to Admission medications   Medication Sig Start Date End Date Taking? Authorizing Provider  atorvastatin (LIPITOR) 20 MG tablet Take 1 tablet (20 mg total) by mouth at bedtime. 11/18/21 07/16/22 Yes Arrien, Jimmy Picket, MD  calcitRIOL (ROCALTROL) 0.25 MCG capsule Take 0.25 mcg by mouth daily. 01/13/22  Yes [provider]  furosemide (LASIX) 40 MG tablet Take 40 mg by mouth daily. 03/05/22  Yes [provider]  [facility]) OINT Apply 1 Application topically See admin instructions. Apply to buttock two times per day and after each incontinent episode.   Yes [provider]  ivabradine (CORLANOR) 5 MG TABS tablet Take 1 tablet (5 mg total) by mouth 2 (two) times daily with a meal. 11/18/21  Yes Arrien, Jimmy Picket, MD  midodrine (PROAMATINE) 10 MG tablet Take 1 tablet (10 mg total) by mouth 2 (two) times daily with a meal. 03/20/22  Yes Domenic Polite, MD  Multiple Vitamin (MULTIVITAMIN WITH MINERALS) TABS tablet Take 1 tablet by mouth daily. 12/05/21  Yes Reed, Tiffany L, DO  Polyvinyl Alcohol-Povidone (REFRESH OP) Place 1-2 drops into both eyes every 6 (six) hours as needed (dry eye).   Yes [provider]  potassium chloride (KLOR-CON) 10 MEQ tablet Take 10  mEq by mouth daily. 03/05/22  Yes [provider]  Skin Protectants, Misc. (EUCERIN) cream Apply 1 Application topically 3 (three) times daily as needed for dry skin.   Yes [provider]  zinc oxide 20 % ointment Apply 1 Application topically as needed (each incontinent episode). 05/20/22  Yes [provider]    Physical Exam: Vitals:   07/16/22 1255 07/16/22 1257 07/16/22 1300 07/16/22 1415  BP:   (!) 120/95 (!) 141/86   Pulse:   (!) 42 60  Resp:   20 (!) 24  Temp:  (!) 97.4 F (36.3 C)    TempSrc:  Oral    SpO2: 100%  99% 92%    Constitutional: NAD, calm, comfortable Vitals:   07/16/22 1255 07/16/22 1257 07/16/22 1300 07/16/22 1415  BP:   (!) 120/95 (!) 141/86  Pulse:   (!) 42 60  Resp:   20 (!) 24  Temp:  (!) 97.4 F (36.3 C)    TempSrc:  Oral    SpO2: 100%  99% 92%   Eyes: PERRL, lids and conjunctivae normal ENMT: Mucous membranes are moist. Posterior pharynx clear of any exudate or lesions.Normal dentition.  Neck: normal, supple, no masses, no thyromegaly Respiratory: clear to auscultation bilaterally, scattered wheezing on bilateral fields, fine crackles to bilateral mid levels, increasing breathing effort. Cardiovascular: Regular rate and rhythm, no murmurs / rubs / gallops. 2+ extremity edema to lower abdomen. 2+ pedal pulses. No carotid bruits.  Abdomen: no tenderness, no masses palpated. No hepatosplenomegaly. Bowel sounds positive.  Musculoskeletal: no clubbing / cyanosis. No joint deformity upper and lower extremities. Good ROM, no contractures. Normal muscle tone.  Skin: no rashes, lesions, ulcers. No induration Neurologic: CN 2-12 grossly intact. Sensation intact, DTR normal. Strength 5/5 in all 4.  Psychiatric: Normal judgment and insight. Alert and oriented x 3. Normal mood.    Labs on Admission: I have personally reviewed following labs and imaging studies  CBC: Recent Labs  Lab 07/16/22 1251 07/16/22 1410  WBC 5.6  --   NEUTROABS 4.1  --   HGB 13.1 14.3  HCT 40.6 42.0  MCV 85.8  --   PLT 131*  --    Basic Metabolic Panel: Recent Labs  Lab 07/16/22 1251 07/16/22 1410  NA 142 144  K 4.2 3.9  CL 105  --   CO2 27  --   GLUCOSE 103*  --   BUN 51*  --   CREATININE 1.82*  --   CALCIUM 9.3  --    GFR: CrCl cannot be calculated (Unknown ideal weight.). Liver Function Tests: Recent Labs  Lab 07/16/22 1251  AST 93*  ALT 54*  ALKPHOS 188*  BILITOT 1.4*  PROT  6.9  ALBUMIN 3.6   Recent Labs  Lab 07/16/22 1251  LIPASE 37   No results for input(s): "AMMONIA" in the last 168 hours. Coagulation Profile: Recent Labs  Lab 07/16/22 1251  INR 1.4*   Cardiac Enzymes: No results for input(s): "CKTOTAL", "CKMB", "CKMBINDEX", "TROPONINI" in the last 168 hours. BNP (last 3 results) No results for input(s): "PROBNP" in the last 8760 hours. HbA1C: No results for input(s): "HGBA1C" in the last 72 hours. CBG: No results for input(s): "GLUCAP" in the last 168 hours. Lipid Profile: No results for input(s): "CHOL", "HDL", "LDLCALC", "TRIG", "CHOLHDL", "LDLDIRECT" in the last 72 hours. Thyroid Function Tests: No results for input(s): "TSH", "T4TOTAL", "FREET4", "T3FREE", "THYROIDAB" in the last 72 hours. Anemia Panel: No results for input(s): "VITAMINB12", "FOLATE", "FERRITIN", "  TIBC", "IRON", "RETICCTPCT" in the last 72 hours. Urine analysis:    Component Value Date/Time   COLORURINE AMBER (A) 03/25/2022 0127   APPEARANCEUR CLEAR 03/25/2022 0127   LABSPEC 1.023 03/25/2022 0127   PHURINE 5.0 03/25/2022 0127   GLUCOSEU NEGATIVE 03/25/2022 0127   HGBUR NEGATIVE 03/25/2022 0127   BILIRUBINUR NEGATIVE 03/25/2022 0127   KETONESUR NEGATIVE 03/25/2022 0127   PROTEINUR 100 (A) 03/25/2022 0127   NITRITE NEGATIVE 03/25/2022 0127   LEUKOCYTESUR NEGATIVE 03/25/2022 0127    Radiological Exams on Admission: DG Chest Portable 1 View  Result Date: 07/16/2022 CLINICAL DATA:  Shortness of breath. EXAM: PORTABLE CHEST 1 VIEW COMPARISON:  04/04/2022 FINDINGS: 1313 hours. The cardio pericardial silhouette is enlarged. Vascular congestion with diffuse interstitial opacity suggests edema. There is some atelectasis at the lung bases with probable small left effusion. Bones are demineralized. Telemetry leads overlie the chest. IMPRESSION: Enlargement of the cardiopericardial silhouette with vascular congestion and diffuse interstitial opacity suggesting edema.  Electronically Signed   By: Misty Stanley M.D.   On: 07/16/2022 13:25    EKG: Independently reviewed. Sinus with frequent PVCs, no acute ST-T changes  Assessment/Plan Principal Problem:   CHF (congestive heart failure) (HCC) Active Problems:   Acute on chronic systolic CHF (congestive heart failure) (HCC)   COPD with acute exacerbation (HCC)   Bipolar disorder (HCC)   HFrEF (heart failure with reduced ejection fraction) (HCC)   Acute on chronic respiratory failure with hypoxia and hypercapnia (HCC)  (please populate well all problems here in Problem List. (For example, if patient is on BP meds at home and you resume or decide to hold them, it is a problem that needs to be her. Same for CAD, COPD, HLD and so on)  Acute on chronic hypoxic respiratory failure -Off BIPAP in ED -Likely combined effect from acute decompensated CHF and acute COPD exacerbation -To treat acute on chronic combined HFrEF and HFpEF decompensation, continue Lasix IV 40 mg twice daily -I&O's, daily weight -Monitor kidney function is also suspect patient has cardiorenal syndrome -Echocardiogram was done about 2 months ago, will not repeat at this point.  Acute COPD exacerbation -No significant CO2 retention, start p.o. steroid -LABA and ICS -DuoNebs plus as needed albuterol -Incentive spirometry  AKI on CKD stageIIIb -Clinically significant fluid overload, suspect cardiorenal syndrome -Continue IV Lasix 40 twice daily, monitor kidney function -Hold off Ivabradine  Acute on chronic cirrhosis decompensation -With elevation of INR, and increase of transaminitis which is new. -Mild related to CHF decompensation, management as above -Check RUQ ultrasound and hold off statin until LFTs stabilized.  Elevated troponins -No chest pain, trend Trop level, mechanism likely demanding ischemia from Hypoxia from CHF and COPD exacerbation.  Transaminitis -Likely secondary related to CHF decompensation and liver  congestion, management as above  Hx of orthostatic hypotension -BP borderline low -Continue midodrine, Albumin level stable  Cirrhosis -Fluid overload likely from CHF decompensation as her Albumin level is stable and INR slightly elevated -Check Ammonia level, add PRN lactulose  Hx of PAF -In sinus rhythm, not on anticoagulation secondary to bleeding risk as per cardiologist note on November 18, 2021.    DVT prophylaxis: Heparin subQ Code Status: DNR, D/W patient at bedside Family Communication: None at bedside Disposition Plan: Patient is sick with CHF decompensation COPD exacerbation requiring IV Lasix and inpatient COPD management, expect more than 2 midnight hospital stay Consults called: None  Admission status: Tele admit   Lequita Halt MD [facility] Pager [phone]  07/16/2022, 3:02 PM

## 2022-07-16 NOTE — ED Triage Notes (Signed)
Patient BIB [facility] from [facility] for evaluation of shortness of breath and abdominal pain that started several days ago. Patient wears 2L O2 [place] at baseline, patient received 2x breathing treatments from EMS PTA. Patient is alert, oriented, speaking in complete sentences at this time. Patient denies chest pain.

## 2022-07-16 NOTE — ED Notes (Signed)
ED TO INPATIENT HANDOFF REPORT  ED Nurse Name and Phone #: Doylene Canning, RN [phone]  S Name/Age/Gender Valerie Rodgers 72 y.o. female Room/Bed: 007C/007C  Code Status   Code Status: DNR  Home/SNF/Other SNF Patient oriented to: self, place, time, and situation Is this baseline? Yes   Triage Complete: Triage complete  Chief Complaint CHF (congestive heart failure) ([place]) [I50.9]  Triage Note Patient BIB [facility] from [facility] for evaluation of shortness of breath and abdominal pain that started several days ago. Patient wears 2L O2 [place] at baseline, patient received 2x breathing treatments from EMS PTA. Patient is alert, oriented, speaking in complete sentences at this time. Patient denies chest pain.   Allergies No Known Allergies  Level of Care/Admitting Diagnosis ED Disposition     ED Disposition  Admit   Condition  --   Comment  Hospital Area: [place] [[id number]]  Level of Care: Telemetry Medical [[id number]]  May admit patient to [facility] or [facility] if equivalent level of care is available:: No  Covid Evaluation: Asymptomatic - no recent exposure (last 10 days) testing not required  Diagnosis: CHF (congestive heart failure) [facility]) [[id number]]  Admitting Physician: Lequita Halt [[id number]]  Attending Physician: Lequita Halt [[id number]]  Certification:: I certify this patient will need inpatient services for at least 2 midnights  Estimated Length of Stay: 2          B Medical/Surgery History Past Medical History:  Diagnosis Date   Arthritis    Bipolar affective disorder [facility])    Cataract    Past Surgical History:  Procedure Laterality Date   ABDOMINAL HYSTERECTOMY       A IV Location/Drains/Wounds Patient Lines/Drains/Airways Status     Active Line/Drains/Airways     Name Placement date Placement time Site Days   Peripheral IV 07/16/22 20 G Right Antecubital 07/16/22  1247  Antecubital  less than 1   Peripheral IV 07/16/22 Left Antecubital  07/16/22  --  Antecubital  less than 1   External Urinary Catheter 04/05/22  1000  --  102   Pressure Injury 03/13/22 Buttocks Left;Lower Stage 2 -  Partial thickness loss of dermis presenting as a shallow open injury with a red, pink wound bed without slough. 03/13/22  0730  -- 125   Wound / Incision (Open or Dehisced) 03/13/22 Other (Comment);Non-pressure wound Tibial Posterior;Proximal;Right Scab 03/13/22  0730  Tibial  125   Wound / Incision (Open or Dehisced) 03/26/22 Non-pressure wound Pretibial Right RLE open blisters around circum of calf 03/26/22  0213  Pretibial  112   Wound / Incision (Open or Dehisced) 03/26/22 Non-pressure wound Pretibial Left;Circumferential LLE open blisters around calf 03/26/22  0215  Pretibial  112            Intake/Output Last 24 hours  Intake/Output Summary (Last 24 hours) at 07/16/2022 1903 Last data filed at 07/16/2022 1523 Gross per 24 hour  Intake 50 ml  Output --  Net 50 ml    Labs/Imaging Results for orders placed or performed during the hospital encounter of 07/16/22 (from the past 48 hour(s))  CBC with Differential     Status: Abnormal   Collection Time: 07/16/22 12:51 PM  Result Value Ref Range   WBC 5.6 4.0 - 10.5 K/uL   RBC 4.73 3.87 - 5.11 MIL/uL   Hemoglobin 13.1 12.0 - 15.0 g/dL   HCT 40.6 36.0 - 46.0 %   MCV 85.8 80.0 - 100.0 fL   MCH 27.7  26.0 - 34.0 pg   MCHC 32.3 30.0 - 36.0 g/dL   RDW 93.2 (H) 67.1 - 24.5 %   Platelets 131 (L) 150 - 400 K/uL    Comment: REPEATED TO VERIFY   nRBC 1.6 (H) 0.0 - 0.2 %   Neutrophils Relative % 73 %   Neutro Abs 4.1 1.7 - 7.7 K/uL   Lymphocytes Relative 14 %   Lymphs Abs 0.8 0.7 - 4.0 K/uL   Monocytes Relative 12 %   Monocytes Absolute 0.7 0.1 - 1.0 K/uL   Eosinophils Relative 1 %   Eosinophils Absolute 0.0 0.0 - 0.5 K/uL   Basophils Relative 0 %   Basophils Absolute 0.0 0.0 - 0.1 K/uL   Immature Granulocytes 0 %   Abs Immature Granulocytes 0.02 0.00 - 0.07 K/uL    Comment: Performed  at [facility], [address] [place], [place] [address]  Basic metabolic panel     Status: Abnormal   Collection Time: 07/16/22 12:51 PM  Result Value Ref Range   Sodium 142 135 - 145 mmol/L   Potassium 4.2 3.5 - 5.1 mmol/L   Chloride 105 98 - 111 mmol/L   CO2 27 22 - 32 mmol/L   Glucose, Bld 103 (H) 70 - 99 mg/dL    Comment: Glucose reference range applies only to samples taken after fasting for at least 8 hours.   BUN 51 (H) 8 - 23 mg/dL   Creatinine, Ser 3.38 (H) 0.44 - 1.00 mg/dL   Calcium 9.3 8.9 - 25.0 mg/dL   GFR, Estimated 29 (L) >60 mL/min    Comment: (NOTE) Calculated using the CKD-EPI Creatinine Equation (2021)    Anion gap 10 5 - 15    Comment: Performed at [facility], [address], [place], [place] [address]  Troponin I (High Sensitivity)     Status: Abnormal   Collection Time: 07/16/22 12:51 PM  Result Value Ref Range   Troponin I (High Sensitivity) 152 (HH) <18 ng/L    Comment: CRITICAL RESULT CALLED TO, READ BACK BY AND VERIFIED WITH C,ROWE RN @1420  07/16/22 E,BENTON (NOTE) Elevated high sensitivity troponin I (hsTnI) values and significant  changes across serial measurements may suggest ACS but many other  chronic and acute conditions are known to elevate hsTnI results.  Refer to the "Links" section for chest pain algorithms and additional  guidance. Performed at [facility], [address], [place], [place]   Protime-INR     Status: Abnormal   Collection Time: 07/16/22 12:51 PM  Result Value Ref Range   Prothrombin Time 16.7 (H) 11.4 - 15.2 seconds   INR 1.4 (H) 0.8 - 1.2    Comment: (NOTE) INR goal varies based on device and disease states. Performed at [facility], [address], [place], [place]   Hepatic function panel     Status: Abnormal   Collection Time: 07/16/22 12:51 PM  Result Value Ref Range   Total Protein 6.9 6.5 - 8.1 g/dL   Albumin 3.6 3.5 - 5.0 g/dL   AST 93 (H) 15 - 41 U/L   ALT  54 (H) 0 - 44 U/L   Alkaline Phosphatase 188 (H) 38 - 126 U/L   Total Bilirubin 1.4 (H) 0.3 - 1.2 mg/dL   Bilirubin, Direct 0.6 (H) 0.0 - 0.2 mg/dL   Indirect Bilirubin 0.8 0.3 - 0.9 mg/dL    Comment: Performed at [facility], [address], [place], [place] [address]  Lipase, blood     Status: None   Collection Time: 07/16/22 12:51 PM  Result Value Ref Range   Lipase 37 11 - 51 U/L    Comment: Performed at [facility], [address] [place], [place] [address]  Brain natriuretic peptide     Status: Abnormal   Collection Time: 07/16/22 12:52 PM  Result Value Ref Range   B Natriuretic Peptide >4,500.0 (H) 0.0 - 100.0 pg/mL    Comment: Performed at [facility], [address] [place], [place] [address]  Urinalysis, Routine w reflex microscopic -Urine, Clean Catch     Status: Abnormal   Collection Time: 07/16/22 12:53 PM  Result Value Ref Range   Color, Urine YELLOW YELLOW   APPearance HAZY (A) CLEAR   Specific Gravity, Urine 1.006 1.005 - 1.030   pH 5.0 5.0 - 8.0   Glucose, UA NEGATIVE NEGATIVE mg/dL   Hgb urine dipstick MODERATE (A) NEGATIVE   Bilirubin Urine NEGATIVE NEGATIVE   Ketones, ur NEGATIVE NEGATIVE mg/dL   Protein, ur NEGATIVE NEGATIVE mg/dL   Nitrite NEGATIVE NEGATIVE   Leukocytes,Ua LARGE (A) NEGATIVE   RBC / HPF 6-10 0 - 5 RBC/hpf   WBC, UA 0-5 0 - 5 WBC/hpf   Bacteria, UA MANY (A) NONE SEEN   Squamous Epithelial / HPF 0-5 0 - 5 /HPF   Mucus PRESENT    Hyaline Casts, UA PRESENT     Comment: Performed at [facility], [address], [place], [place] [address]  Rapid urine drug screen (hospital performed)     Status: None   Collection Time: 07/16/22 12:53 PM  Result Value Ref Range   Opiates NONE DETECTED NONE DETECTED   Cocaine NONE DETECTED NONE DETECTED   Benzodiazepines NONE DETECTED NONE DETECTED   Amphetamines NONE DETECTED NONE DETECTED   Tetrahydrocannabinol NONE DETECTED NONE DETECTED   Barbiturates NONE DETECTED NONE  DETECTED    Comment: (NOTE) DRUG SCREEN FOR MEDICAL PURPOSES ONLY.  IF CONFIRMATION IS NEEDED FOR ANY PURPOSE, NOTIFY LAB WITHIN 5 DAYS.  LOWEST DETECTABLE LIMITS FOR URINE DRUG SCREEN Drug Class                     Cutoff (ng/mL) Amphetamine and metabolites    1000 Barbiturate and metabolites    200 Benzodiazepine                 200 Opiates and metabolites        300 Cocaine and metabolites        300 THC                            50 Performed at [facility], [address], [place], [place] [address]   Lactic acid, plasma     Status: Abnormal   Collection Time: 07/16/22 12:53 PM  Result Value Ref Range   Lactic Acid, Venous 2.5 (HH) 0.5 - 1.9 mmol/L    Comment: CRITICAL RESULT CALLED TO, READ BACK BY AND VERIFIED WITH C,ROWE RN @1448  07/16/22 E,BENTON Performed at [facility], [address] [place], [place]   Resp panel by RT-PCR (RSV, Flu A&B, Covid) Anterior Nasal Swab     Status: None   Collection Time: 07/16/22  1:08 PM   Specimen: Anterior Nasal Swab  Result Value Ref Range   SARS Coronavirus 2 by RT PCR  NEGATIVE NEGATIVE    Comment: (NOTE) SARS-CoV-2 target nucleic acids are NOT DETECTED.  The SARS-CoV-2 RNA is generally detectable in upper respiratory specimens during the acute phase of infection. The lowest concentration of SARS-CoV-2 viral copies this assay can detect is 138 copies/mL. A negative result does not preclude SARS-Cov-2 infection and should not be used as the sole basis for treatment or other patient management decisions. A negative result may occur with  improper specimen collection/handling, submission of specimen other than nasopharyngeal swab, presence of viral mutation(s) within the areas targeted by this assay, and inadequate number of viral copies(<138 copies/mL). A negative result must be combined with clinical observations, patient history, and epidemiological information. The expected result is Negative.  Fact  Sheet for Patients:  EntrepreneurPulse.com.au  Fact Sheet for Healthcare Providers:  IncredibleEmployment.be  This test is no t yet approved or cleared by the [place] FDA and  has been authorized for detection and/or diagnosis of SARS-CoV-2 by FDA under an Emergency Use Authorization (EUA). This EUA will remain  in effect (meaning this test can be used) for the duration of the COVID-19 declaration under Section 564(b)(1) of the Act, 21 U.S.C.section 360bbb-3(b)(1), unless the authorization is terminated  or revoked sooner.       Influenza A by PCR NEGATIVE NEGATIVE   Influenza B by PCR NEGATIVE NEGATIVE    Comment: (NOTE) The Xpert Xpress SARS-CoV-2/FLU/RSV plus assay is intended as an aid in the diagnosis of influenza from Nasopharyngeal swab specimens and should not be used as a sole basis for treatment. Nasal washings and aspirates are unacceptable for Xpert Xpress SARS-CoV-2/FLU/RSV testing.  Fact Sheet for Patients: EntrepreneurPulse.com.au  Fact Sheet for Healthcare Providers: IncredibleEmployment.be  This test is not yet approved or cleared by the [place] FDA and has been authorized for detection and/or diagnosis of SARS-CoV-2 by FDA under an Emergency Use Authorization (EUA). This EUA will remain in effect (meaning this test can be used) for the duration of the COVID-19 declaration under Section 564(b)(1) of the Act, 21 U.S.C. section 360bbb-3(b)(1), unless the authorization is terminated or revoked.     Resp Syncytial Virus by PCR NEGATIVE NEGATIVE    Comment: (NOTE) Fact Sheet for Patients: EntrepreneurPulse.com.au  Fact Sheet for Healthcare Providers: IncredibleEmployment.be  This test is not yet approved or cleared by the [place] FDA and has been authorized for detection and/or diagnosis of SARS-CoV-2 by FDA under an Emergency Use  Authorization (EUA). This EUA will remain in effect (meaning this test can be used) for the duration of the COVID-19 declaration under Section 564(b)(1) of the Act, 21 U.S.C. section 360bbb-3(b)(1), unless the authorization is terminated or revoked.  Performed at [facility], [place] [address] [place], [place] [address]   I-Stat arterial blood gas, ED [facility], [facility], [facility])     Status: Abnormal   Collection Time: 07/16/22  2:10 PM  Result Value Ref Range   pH, Arterial 7.354 7.35 - 7.45   pCO2 arterial 46.8 32 - 48 mmHg   pO2, Arterial 64 (L) 83 - 108 mmHg   Bicarbonate 26.3 20.0 - 28.0 mmol/L   TCO2 28 22 - 32 mmol/L   O2 Saturation 92 %   Acid-Base Excess 0.0 0.0 - 2.0 mmol/L   Sodium 144 135 - 145 mmol/L   Potassium 3.9 3.5 - 5.1 mmol/L   Calcium, Ion 1.29 1.15 - 1.40 mmol/L   HCT 42.0 36.0 - 46.0 %   Hemoglobin 14.3 12.0 - 15.0 g/dL  Patient temperature 36.3 C    Sample type ARTERIAL   Troponin I (High Sensitivity)     Status: Abnormal   Collection Time: 07/16/22  2:51 PM  Result Value Ref Range   Troponin I (High Sensitivity) 148 (HH) <18 ng/L    Comment: CRITICAL VALUE NOTED. VALUE IS CONSISTENT WITH PREVIOUSLY REPORTED/CALLED VALUE (NOTE) Elevated high sensitivity troponin I (hsTnI) values and significant  changes across serial measurements may suggest ACS but many other  chronic and acute conditions are known to elevate hsTnI results.  Refer to the "Links" section for chest pain algorithms and additional  guidance. Performed at [facility], [address], [place], [place] [address]   Lactic acid, plasma     Status: Abnormal   Collection Time: 07/16/22  2:53 PM  Result Value Ref Range   Lactic Acid, Venous 2.2 (HH) 0.5 - 1.9 mmol/L    Comment: CRITICAL VALUE NOTED. VALUE IS CONSISTENT WITH PREVIOUSLY REPORTED/CALLED VALUE Performed at [facility], [address] [place], [place] [address]   Ammonia     Status: None   Collection Time: 07/16/22   3:30 PM  Result Value Ref Range   Ammonia 23 9 - 35 umol/L    Comment: Performed at [facility], [address], [place], [place] [address]  HIV Antibody (routine testing w rflx)     Status: None   Collection Time: 07/16/22  3:30 PM  Result Value Ref Range   HIV Screen 4th Generation wRfx Non Reactive Non Reactive    Comment: Performed at [facility], [address] [place], [place] [address]   US Abdomen Limited RUQ (LIVER/GB)  Result Date: 07/16/2022 CLINICAL DATA:  Hepatitis. EXAM: ULTRASOUND ABDOMEN LIMITED RIGHT UPPER QUADRANT COMPARISON:  March 25, 2022 FINDINGS: Gallbladder: The gallbladder wall measures 6 mm. The gallbladder is contracted. No other abnormalities identified. Common bile duct: Diameter: 4.3 mm Liver: Nodular contour. Mildly heterogeneous echogenicity. No focal mass. Portal vein is patent on color Doppler imaging with normal direction of blood flow towards the liver. Other: Moderate ascites.  Right pleural effusion. IMPRESSION: 1. The liver demonstrates a nodular contour and heterogeneous echogenicity suggesting cirrhosis. There is normal directional blood flow in the portal vein. No liver mass identified. 2. The gallbladder is poorly evaluated due to lack of distention. This may partially explain the gallbladder wall thickening. Gallbladder wall thickening may also be seen in the setting of hepatitis or cirrhosis. No stones, sludge, or Murphy's sign identified on limited views of the gallbladder. 3. The common bile duct is normal. 4. Moderate ascites.  Right pleural effusion. Electronically Signed   By: Gerome Sam III M.D.   On: 07/16/2022 16:11   US RENAL  Result Date: 07/16/2022 CLINICAL DATA:  Acute kidney injury. EXAM: RENAL / URINARY TRACT ULTRASOUND COMPLETE COMPARISON:  None Available. FINDINGS: Right Kidney: Renal measurements: 9.0 x 6.4 x 4.6 cm = volume: 117 mL. Increased echogenicity of renal parenchyma is noted suggesting medical renal disease.  No mass or hydronephrosis visualized. Left Kidney: Renal measurements: 9.6 x 4.5 x 5.5 cm = volume: 124 mL. Increased echogenicity of renal parenchyma is noted suggesting medical renal disease. No mass or hydronephrosis visualized. Bladder: Patient declined visualization of the bladder. Other: Moderate ascites is noted. IMPRESSION: Increased echogenicity of renal parenchyma is noted bilaterally suggesting medical renal disease. No hydronephrosis or renal obstruction is noted. Moderate ascites. Electronically Signed   By: Lupita Raider M.D.   On:  07/16/2022 15:57   DG Chest Portable 1 View  Result Date: 07/16/2022 CLINICAL DATA:  Shortness of breath. EXAM: PORTABLE CHEST 1 VIEW COMPARISON:  04/04/2022 FINDINGS: 1313 hours. The cardio pericardial silhouette is enlarged. Vascular congestion with diffuse interstitial opacity suggests edema. There is some atelectasis at the lung bases with probable small left effusion. Bones are demineralized. Telemetry leads overlie the chest. IMPRESSION: Enlargement of the cardiopericardial silhouette with vascular congestion and diffuse interstitial opacity suggesting edema. Electronically Signed   By: Kennith Center M.D.   On: 07/16/2022 13:25    Pending Labs Unresulted Labs (From admission, onward)     Start     Ordered   07/17/22 0500  Basic metabolic panel  Daily,   R     Comments: As Scheduled for 5 days    07/16/22 1502   07/17/22 0500  Hepatic function panel  Tomorrow morning,   R        07/16/22 1512            Vitals/Pain Today's Vitals   07/16/22 1300 07/16/22 1415 07/16/22 1430 07/16/22 1500  BP: (!) 120/95 (!) 141/86 114/87 112/75  Pulse: (!) 42 60    Resp: 20 (!) 24 10 19   Temp:      TempSrc:      SpO2: 99% 92%    PainSc:        Isolation Precautions Airborne and Contact precautions  Medications Medications  sodium chloride flush (NS) 0.9 % injection 3 mL (3 mLs Intravenous Given 07/16/22 1654)  sodium chloride flush (NS) 0.9 %  injection 3 mL (has no administration in time range)  0.9 %  sodium chloride infusion (has no administration in time range)  acetaminophen (TYLENOL) tablet 650 mg (has no administration in time range)  ondansetron (ZOFRAN) injection 4 mg (has no administration in time range)  heparin injection 5,000 Units (5,000 Units Subcutaneous Given 07/16/22 1657)  furosemide (LASIX) injection 40 mg (has no administration in time range)  budesonide (PULMICORT) nebulizer solution 2 mg (has no administration in time range)  predniSONE (DELTASONE) tablet 40 mg (has no administration in time range)  mometasone-formoterol (DULERA) 200-5 MCG/ACT inhaler 2 puff (has no administration in time range)  albuterol (PROVENTIL) (2.5 MG/3ML) 0.083% nebulizer solution 2.5 mg (has no administration in time range)  midodrine (PROAMATINE) tablet 10 mg (10 mg Oral Given 07/16/22 1657)  hydrALAZINE (APRESOLINE) injection 5 mg (has no administration in time range)  lactulose (CHRONULAC) 10 GM/15ML solution 30 g (has no administration in time range)  ipratropium-albuterol (DUONEB) 0.5-2.5 (3) MG/3ML nebulizer solution 3 mL (has no administration in time range)  furosemide (LASIX) injection 40 mg (40 mg Intravenous Given 07/16/22 1358)  methylPREDNISolone sodium succinate (SOLU-MEDROL) 125 mg/2 mL injection 125 mg (125 mg Intravenous Given 07/16/22 1356)  magnesium sulfate IVPB 2 g 50 mL (0 g Intravenous Stopped 07/16/22 1500)  ipratropium-albuterol (DUONEB) 0.5-2.5 (3) MG/3ML nebulizer solution 3 mL (3 mLs Nebulization Given 07/16/22 1358)    Mobility walks with device     Focused Assessments    R Recommendations: See Admitting Provider Note  Report given to:   Additional Notes: Patient is A&Ox4, but still kind of confused, she's been talking about wanting a hamburger and some ice cream and doesn't seem to talk about what's going on in the room at the time, just whatever she happens to be thinking about. She is very sweet  and calm!

## 2022-07-16 NOTE — ED Provider Notes (Signed)
[place] EMERGENCY DEPARTMENT AT [facility] Provider Note   CSN: [id number] Arrival date & time: 07/16/22  1241     History  Chief Complaint  Patient presents with   Shortness of Breath    PEGAH SEGEL is a 72 y.o. female.  medical history significant for COPD, chronic hypoxic respiratory failure, chronic combined systolic and diastolic CHF, substance abuse, chronic wounds, and atrial fibrillation not on anticoagulation presents via EMS with a 4-day history of worsening shortness of breath, abdominal distention, leg swelling and weight gain.  She denies chest pain.  EMS reports tachypnea in the 30s and unable to obtain O2 saturation.  She does wear 2 L of oxygen at baseline for COPD.  Gave her breathing treatments x 2.  On arrival she is awake and alert and tachypneic.  Denies chest pain.  Complains of abdominal pain since today began.  No vomiting or diarrhea.  She reports has been feeling short of breath for "145 days".  She has difficulty lying flat at baseline and this is not recently changed.  She reports no tightness in her chest but this will tighten her abdomen.  Does have increased leg swelling compared to baseline by report.  Unknown compliance with her medications  The history is provided by the patient and the EMS personnel. The history is limited by the condition of the patient.  Shortness of Breath Associated symptoms: abdominal pain and cough   Associated symptoms: no fever and no vomiting        Home Medications Prior to Admission medications   Medication Sig Start Date End Date Taking? Authorizing Provider  atorvastatin (LIPITOR) 20 MG tablet Take 1 tablet (20 mg total) by mouth at bedtime. 11/18/21 05/02/22  Arrien, York Ram, MD  calcitRIOL (ROCALTROL) 0.25 MCG capsule Take 0.25 mcg by mouth daily. 01/13/22   [provider]  furosemide (LASIX) 40 MG tablet Take 40 mg by mouth daily. 03/05/22   [provider]  ivabradine  (CORLANOR) 5 MG TABS tablet Take 1 tablet (5 mg total) by mouth 2 (two) times daily with a meal. 11/18/21   Arrien, York Ram, MD  midodrine (PROAMATINE) 10 MG tablet Take 1 tablet (10 mg total) by mouth 2 (two) times daily with a meal. 03/20/22   Zannie Cove, MD  Multiple Vitamin (MULTIVITAMIN WITH MINERALS) TABS tablet Take 1 tablet by mouth daily. 12/05/21   Reed, Tiffany L, DO  potassium chloride (KLOR-CON) 10 MEQ tablet Take 10 mEq by mouth daily. 03/05/22   [provider]      Allergies    Patient has no known allergies.    Review of Systems   Review of Systems  Constitutional:  Negative for fever.  Respiratory:  Positive for cough and shortness of breath. Negative for chest tightness.   Gastrointestinal:  Positive for abdominal pain. Negative for nausea and vomiting.  Genitourinary:  Negative for dysuria and hematuria.  Musculoskeletal:  Positive for arthralgias and myalgias.   all other systems are negative except as noted in the HPI and PMH.    Physical Exam Updated Vital Signs BP (!) 113/90   Pulse (!) 32   Temp (!) 97.4 F (36.3 C) (Oral)   Resp (!) 23   SpO2 100%  Physical Exam Vitals and nursing note reviewed.  Constitutional:      General: She is in acute distress.     Appearance: She is well-developed. She is ill-appearing.     Comments: Dyspneic with conversation  HENT:     Head: Normocephalic and atraumatic.     Nose: Congestion present.     Mouth/Throat:     Pharynx: No oropharyngeal exudate.  Eyes:     Conjunctiva/sclera: Conjunctivae normal.     Pupils: Pupils are equal, round, and reactive to light.  Neck:     Comments: No meningismus. Cardiovascular:     Rate and Rhythm: Normal rate and regular rhythm.     Heart sounds: Normal heart sounds. No murmur heard. Pulmonary:     Effort: Pulmonary effort is normal. No respiratory distress.     Breath sounds: Wheezing and rales present.     Comments: Expiratory wheezing throughout with  basilar crackles. Chest:     Chest wall: No tenderness.  Abdominal:     General: There is distension.     Palpations: Abdomen is soft.     Tenderness: There is no abdominal tenderness. There is no guarding or rebound.  Musculoskeletal:        General: No tenderness. Normal range of motion.     Cervical back: Normal range of motion and neck supple.     Right lower leg: Edema present.     Left lower leg: Edema present.  Skin:    General: Skin is warm.  Neurological:     Mental Status: She is alert and oriented to person, place, and time.     Cranial Nerves: No cranial nerve deficit.     Motor: No abnormal muscle tone.     Coordination: Coordination normal.     Comments:  5/5 strength throughout. CN 2-12 intact.Equal grip strength.   Psychiatric:        Behavior: Behavior normal.     ED Results / Procedures / Treatments   Labs (all labs ordered are listed, but only abnormal results are displayed) Labs Reviewed  CBC WITH DIFFERENTIAL/PLATELET - Abnormal; Notable for the following components:      Result Value   RDW 20.3 (*)    Platelets 131 (*)    nRBC 1.6 (*)    All other components within normal limits  BASIC METABOLIC PANEL - Abnormal; Notable for the following components:   Glucose, Bld 103 (*)    BUN 51 (*)    Creatinine, Ser 1.82 (*)    GFR, Estimated 29 (*)    All other components within normal limits  PROTIME-INR - Abnormal; Notable for the following components:   Prothrombin Time 16.7 (*)    INR 1.4 (*)    All other components within normal limits  BRAIN NATRIURETIC PEPTIDE - Abnormal; Notable for the following components:   B Natriuretic Peptide >4,500.0 (*)    All other components within normal limits  LACTIC ACID, PLASMA - Abnormal; Notable for the following components:   Lactic Acid, Venous 2.5 (*)    All other components within normal limits  HEPATIC FUNCTION PANEL - Abnormal; Notable for the following components:   AST 93 (*)    ALT 54 (*)    Alkaline  Phosphatase 188 (*)    Total Bilirubin 1.4 (*)    Bilirubin, Direct 0.6 (*)    All other components within normal limits  I-STAT ARTERIAL BLOOD GAS, ED - Abnormal; Notable for the following components:   pO2, Arterial 64 (*)    All other components within normal limits  TROPONIN I (HIGH SENSITIVITY) - Abnormal; Notable for the following components:   Troponin I (High Sensitivity) 152 (*)    All other components within normal limits  TROPONIN I (HIGH SENSITIVITY) - Abnormal; Notable for the following components:   Troponin I (High Sensitivity) 148 (*)    All other components within normal limits  RESP PANEL BY RT-PCR (RSV, FLU A&B, COVID)  RVPGX2  LIPASE, BLOOD  URINALYSIS, ROUTINE W REFLEX MICROSCOPIC  RAPID URINE DRUG SCREEN, HOSP PERFORMED  LACTIC ACID, PLASMA  AMMONIA  HIV ANTIBODY (ROUTINE TESTING W REFLEX)  BASIC METABOLIC PANEL  HEPATIC FUNCTION PANEL    EKG EKG Interpretation  Date/Time:  Sunday July 16 2022 13:17:11 EST Ventricular Rate:  69 PR Interval:  226 QRS Duration: 64 QT Interval:  418 QTC Calculation: 448 R Axis:   78 Text Interpretation: Sinus rhythm Multiple premature complexes, vent & supraven Prolonged PR interval Low voltage, extremity leads Anteroseptal infarct, old Lead(s) V3 were not used for morphology analysis No significant change was found Confirmed by Ezequiel Essex [phone]) on 07/16/2022 1:18:42 PM  Radiology US Abdomen Limited RUQ (LIVER/GB)  Result Date: 07/16/2022 CLINICAL DATA:  Hepatitis. EXAM: ULTRASOUND ABDOMEN LIMITED RIGHT UPPER QUADRANT COMPARISON:  March 25, 2022 FINDINGS: Gallbladder: The gallbladder wall measures 6 mm. The gallbladder is contracted. No other abnormalities identified. Common bile duct: Diameter: 4.3 mm Liver: Nodular contour. Mildly heterogeneous echogenicity. No focal mass. Portal vein is patent on color Doppler imaging with normal direction of blood flow towards the liver. Other: Moderate ascites.  Right pleural  effusion. IMPRESSION: 1. The liver demonstrates a nodular contour and heterogeneous echogenicity suggesting cirrhosis. There is normal directional blood flow in the portal vein. No liver mass identified. 2. The gallbladder is poorly evaluated due to lack of distention. This may partially explain the gallbladder wall thickening. Gallbladder wall thickening may also be seen in the setting of hepatitis or cirrhosis. No stones, sludge, or Murphy's sign identified on limited views of the gallbladder. 3. The common bile duct is normal. 4. Moderate ascites.  Right pleural effusion. Electronically Signed   By: Dorise Bullion III M.D.   On: 07/16/2022 16:11   US RENAL  Result Date: 07/16/2022 CLINICAL DATA:  Acute kidney injury. EXAM: RENAL / URINARY TRACT ULTRASOUND COMPLETE COMPARISON:  None Available. FINDINGS: Right Kidney: Renal measurements: 9.0 x 6.4 x 4.6 cm = volume: 117 mL. Increased echogenicity of renal parenchyma is noted suggesting medical renal disease. No mass or hydronephrosis visualized. Left Kidney: Renal measurements: 9.6 x 4.5 x 5.5 cm = volume: 124 mL. Increased echogenicity of renal parenchyma is noted suggesting medical renal disease. No mass or hydronephrosis visualized. Bladder: Patient declined visualization of the bladder. Other: Moderate ascites is noted. IMPRESSION: Increased echogenicity of renal parenchyma is noted bilaterally suggesting medical renal disease. No hydronephrosis or renal obstruction is noted. Moderate ascites. Electronically Signed   By: Marijo Conception M.D.   On: 07/16/2022 15:57   DG Chest Portable 1 View  Result Date: 07/16/2022 CLINICAL DATA:  Shortness of breath. EXAM: PORTABLE CHEST 1 VIEW COMPARISON:  04/04/2022 FINDINGS: 1313 hours. The cardio pericardial silhouette is enlarged. Vascular congestion with diffuse interstitial opacity suggests edema. There is some atelectasis at the lung bases with probable small left effusion. Bones are demineralized. Telemetry  leads overlie the chest. IMPRESSION: Enlargement of the cardiopericardial silhouette with vascular congestion and diffuse interstitial opacity suggesting edema. Electronically Signed   By: Misty Stanley M.D.   On: 07/16/2022 13:25    Procedures .Critical Care  Performed by: Ezequiel Essex, MD Authorized by: Ezequiel Essex, MD   Critical care provider statement:    Critical care time (minutes):  35   Critical care time was exclusive of:  Separately billable procedures and treating other patients   Critical care was necessary to treat or prevent imminent or life-threatening deterioration of the following conditions:  Cardiac failure and respiratory failure   Critical care was time spent personally by me on the following activities:  Development of treatment plan with patient or surrogate, discussions with consultants, evaluation of patient's response to treatment, examination of patient, ordering and review of laboratory studies, ordering and review of radiographic studies, ordering and performing treatments and interventions, pulse oximetry, re-evaluation of patient's condition, review of old charts, blood draw for specimens and obtaining history from patient or surrogate   I assumed direction of critical care for this patient from another provider in my specialty: no     Care discussed with: admitting provider       Medications Ordered in ED Medications  furosemide (LASIX) injection 40 mg (has no administration in time range)  methylPREDNISolone sodium succinate (SOLU-MEDROL) 125 mg/2 mL injection 125 mg (has no administration in time range)  magnesium sulfate IVPB 2 g 50 mL (has no administration in time range)  ipratropium-albuterol (DUONEB) 0.5-2.5 (3) MG/3ML nebulizer solution 3 mL (has no administration in time range)    ED Course/ Medical Decision Making/ A&P                             Medical Decision Making Amount and/or Complexity of Data Reviewed Independent Historian:  EMS Labs: ordered. Decision-making details documented in ED Course. Radiology: ordered and independent interpretation performed. Decision-making details documented in ED Course. ECG/medicine tests: ordered and independent interpretation performed. Decision-making details documented in ED Course.  Risk Prescription drug management. Decision regarding hospitalization.   Several days of worsening difficulty breathing with weight gain.  Placed on BiPAP on arrival.  She is given bronchodilators, steroids and IV Lasix.  Appears to have combination CHF and COPD exacerbation.  EKG with wandering pacemaker. No acute ischemia.  CXR with edema. Results reviewed and interpreted by me.  Placed on bipap on arrival. ABG without significant CO2 retention.\ Worsening Creatinine likely 2/2 cardiorenal syndrome. Mild troponin elevation likely 2/2 CHF exacerbation.  Work of breathing improved. Weaned from bipap to [place] O2.  IV lasix given. Suspect SOB multifactorial due to CHF and COPD. Lower suspicion for pulmonary embolism.   Admission d/w Dr. Roosevelt Locks.        Final Clinical Impression(s) / ED Diagnoses Final diagnoses:  Acute on chronic combined systolic and diastolic congestive heart failure [facility])    Rx / DC Orders ED Discharge Orders     None         Airen Dales, Annie Main, MD 07/16/22 1710

## 2022-07-17 DIAGNOSIS — N1831 Chronic kidney disease, stage 3a: Secondary | ICD-10-CM

## 2022-07-17 DIAGNOSIS — I10 Essential (primary) hypertension: Secondary | ICD-10-CM

## 2022-07-17 DIAGNOSIS — I5023 Acute on chronic systolic (congestive) heart failure: Secondary | ICD-10-CM

## 2022-07-17 DIAGNOSIS — J441 Chronic obstructive pulmonary disease with (acute) exacerbation: Secondary | ICD-10-CM

## 2022-07-17 DIAGNOSIS — I4892 Unspecified atrial flutter: Secondary | ICD-10-CM | POA: Diagnosis not present

## 2022-07-17 DIAGNOSIS — E440 Moderate protein-calorie malnutrition: Secondary | ICD-10-CM

## 2022-07-17 LAB — BASIC METABOLIC PANEL
Anion gap: 13 (ref 5–15)
BUN: 50 mg/dL — ABNORMAL HIGH (ref 8–23)
CO2: 26 mmol/L (ref 22–32)
Calcium: 9 mg/dL (ref 8.9–10.3)
Chloride: 104 mmol/L (ref 98–111)
Creatinine, Ser: 1.79 mg/dL — ABNORMAL HIGH (ref 0.44–1.00)
GFR, Estimated: 30 mL/min — ABNORMAL LOW (ref >60)
Glucose, Bld: 128 mg/dL — ABNORMAL HIGH (ref 70–99)
Potassium: 4.1 mmol/L (ref 3.5–5.1)
Sodium: 143 mmol/L (ref 135–145)

## 2022-07-17 LAB — HEPATIC FUNCTION PANEL
ALT: 54 U/L — ABNORMAL HIGH (ref 0–44)
AST: 76 U/L — ABNORMAL HIGH (ref 15–41)
Albumin: 3.5 g/dL (ref 3.5–5.0)
Alkaline Phosphatase: 196 U/L — ABNORMAL HIGH (ref 38–126)
Bilirubin, Direct: 0.6 mg/dL — ABNORMAL HIGH (ref 0.0–0.2)
Indirect Bilirubin: 0.8 mg/dL (ref 0.3–0.9)
Total Bilirubin: 1.4 mg/dL — ABNORMAL HIGH (ref 0.3–1.2)
Total Protein: 7.1 g/dL (ref 6.5–8.1)

## 2022-07-17 MED ORDER — ADULT MULTIVITAMIN W/MINERALS CH
1.0000 | ORAL_TABLET | Freq: Every day | ORAL | Status: DC
  Administered 2022-07-17 – 2022-07-26 (×10): 1 via ORAL
  Filled 2022-07-17 (×10): qty 1

## 2022-07-17 MED ORDER — ENSURE ENLIVE PO LIQD
237.0000 mL | Freq: Two times a day (BID) | ORAL | Status: DC
  Administered 2022-07-17 – 2022-07-26 (×11): 237 mL via ORAL

## 2022-07-17 MED ORDER — FUROSEMIDE 10 MG/ML IJ SOLN
80.0000 mg | Freq: Two times a day (BID) | INTRAMUSCULAR | Status: DC
  Administered 2022-07-17 – 2022-07-22 (×10): 80 mg via INTRAVENOUS
  Filled 2022-07-17 (×10): qty 8

## 2022-07-17 MED ORDER — IPRATROPIUM-ALBUTEROL 0.5-2.5 (3) MG/3ML IN SOLN
3.0000 mL | Freq: Three times a day (TID) | RESPIRATORY_TRACT | Status: DC
  Administered 2022-07-17 – 2022-07-19 (×6): 3 mL via RESPIRATORY_TRACT
  Filled 2022-07-17 (×6): qty 3

## 2022-07-17 MED ORDER — MIDODRINE HCL 5 MG PO TABS
10.0000 mg | ORAL_TABLET | Freq: Three times a day (TID) | ORAL | Status: DC
  Administered 2022-07-17 – 2022-07-20 (×10): 10 mg via ORAL
  Filled 2022-07-17 (×9): qty 2

## 2022-07-17 MED ORDER — IPRATROPIUM-ALBUTEROL 0.5-2.5 (3) MG/3ML IN SOLN: 3.0000 mL | Freq: Three times a day (TID) | RESPIRATORY_TRACT | Status: DC

## 2022-07-17 NOTE — Assessment & Plan Note (Addendum)
03/2022 echocardiogram with reduced LV systolic function with EF 20 to 25%, global hypokinesis. Interventricular septum is flattened in systole and diastole, RV systolic function is preserved. RVSP 43.9. moderate dilatation of LA and RA. Moderate TR.   Urine output is documented at 50 cc Systolic blood pressure 623 to 110 mmHg.   Plan to continue diuresis with furosemide, will increase dose to 80 mg IV q12 hrs.  Increase midodrine to 10 mg tid.  Hold ivabradine due to bradycardia.  Limited pharmacologic options due to reduced GFR.   Elevated liver enzymes due to congestive hepatopathy, continue diuresis.  Acute on chronic hypoxemic respiratory failure due to acute cardiogenic pulmonary edema. Continue supplemental 02 per [place] and diuresis.   Troponin elevation due to heart failure exacerbation, no clinical signs of acute coronary syndrome.

## 2022-07-17 NOTE — Assessment & Plan Note (Addendum)
No clinical signs of exacerbation, continue oxymetry monitoring.  Discontinue corticosteroids.

## 2022-07-17 NOTE — Assessment & Plan Note (Signed)
Continue blood pressure support with midodrine  Patient with advanced heart failure and very poor physical functional capacity.  Poor prognosis.

## 2022-07-17 NOTE — Progress Notes (Signed)
Heart Failure Navigator Progress Note  Assessed for Heart & Vascular TOC clinic readiness.  Patient does not meet criteria due to [facility] patient.   Navigator will sign off at this time.    Karstyn Birkey, BSN, RN Heart Failure Nurse Navigator Secure Chat Only   

## 2022-07-17 NOTE — Assessment & Plan Note (Signed)
Currently patient is sinus rhythm. As outpatient not on anticoagulation. Will review old medical records.

## 2022-07-17 NOTE — Progress Notes (Addendum)
Progress Note   Patient: Valerie Rodgers BJY:[id number] DOB: 1950-12-08 DOA: 07/16/2022     1 DOS: the patient was seen and examined on 07/17/2022   Brief hospital course: Valerie Rodgers was admitted to the hospital with the working diagnosis of heart failure decompensation.   72 yo female with the past medical history of COPD, heart failure with reduced LV systolic function, CKD, cirrhosis, and paroxysmal atrial fibrillation who presented with dyspnea, and abdominal edema. Reported 3 to 4 days of worsening dyspnea, that progressed to abdominal and lower extremity edema. On the day of hospitalization EMS was called and patient was found in respiratory distress, she received a nebulizer treatment, placed on Bipap and transported to the ED. In the emergency room her blood pressure was 120/95, HR 42 to 60, RR 24 02 saturation 92%, lungs with scattered wheezing and rales bilaterally at mid lung level, heart with S1 and S2 present an regular with no murmurs or gallops, abdomen with no distention and positive lower extremity edema.   Na 142, K 4,2 CL 105, bicarbonate 27 glucose 103 bun 51 cr 1.8  AST 93 ALT 54 BNP >4,500 High sensitive troponin 152 and 148  Lactic acid 2,5  Wbc 5.6 hgb 13.1 plt 131   SARS covid 19 negative Influenza A and B negative   Urine analysis with SG 1.006, negative protein, 0-5 leukocytes.  Toxicology screen negative (was positive for cocaine on 03/2022).   Chest radiograph with cardiomegaly, bilateral hilar vascular congestion and bilateral diffuse interstitial infiltrates.   EKG 69 bpm, normal axis, normal intervals, sinus rhythm with PVC (left ventricle) and PAC, with low voltage with no significant ST segment or T wave changes. (Chronic low voltage).   Patient was placed on furosemide for diuresis.      Assessment and Plan: Acute on chronic systolic CHF (congestive heart failure) (Valerie Rodgers) 03/2022 echocardiogram with reduced LV systolic function with EF 20 to 25%,  global hypokinesis. Interventricular septum is flattened in systole and diastole, RV systolic function is preserved. RVSP 43.9. moderate dilatation of LA and RA. Moderate TR.   Urine output is documented at 50 cc Systolic blood pressure 086 to 110 mmHg.   Plan to continue diuresis with furosemide, will increase dose to 80 mg IV q12 hrs.  Increase midodrine to 10 mg tid.  Hold ivabradine due to bradycardia.  Limited pharmacologic options due to reduced GFR.   Elevated liver enzymes due to congestive hepatopathy, continue diuresis.  Acute on chronic hypoxemic respiratory failure due to acute cardiogenic pulmonary edema. Continue supplemental 02 per Valerie Rodgers and diuresis.   Troponin elevation due to heart failure exacerbation, no clinical signs of acute coronary syndrome.   Essential hypertension Continue blood pressure monitoring. Will resume midodrine.  Patient with advanced heart failure and very poor physical functional capacity.  Poor prognosis.   Atrial flutter, paroxysmal (Valerie Rodgers) Currently patient is sinus rhythm. As outpatient not on anticoagulation. Will review old medical records.   Stage 3a chronic kidney disease (CKD) (Valerie Rodgers) AKI  Renal function with serum cr at 1.79 with K at 4,1 and serum bicarbonate at 26.   Continue diuresis with furosemide and follow up renal function in am. Avoid hypotension and nephrotoxic medications.  COPD with acute exacerbation (Valerie Rodgers) No clinical signs of exacerbation, continue oxymetry monitoring.  Discontinue corticosteroids.   Bipolar disorder (Valerie Rodgers) Not on psychotropic medications. She seems to be confused and disorientated, not clear if is her baseline. I suspect cognitive impairment, will call her family for more information.  Continue neuro checks per unit protocol.  Malnutrition of moderate degree Continue with nutritional supplements.         Subjective: Patient is awake and alert but not able to give any detail history, responds  yes to randomly to simple questions   Physical Exam: Vitals:   07/17/22 0951 07/17/22 0953 07/17/22 1024 07/17/22 1455  BP:   104/83   Pulse:   82   Resp:   17   Temp:   (!) 97.3 F (36.3 C)   TempSrc:   Axillary   SpO2: 96% 96% 100% 96%  Weight:      Height:       Neurology awake and alert, poor mobility of upper and lower extremities, moves spontaneously but not following commands.  ENT with mild pallor Cardiovascular with S1 and S2 present and rhythmic with no gallops, rubs or murmurs Respiratory with rales at bases with no wheezing or rhonchi Abdomen with no distention  Positive pitting edema at the thighs ++, distal extremities cold and dry skin with trace edema.  Symmetric erythema with no purulence.  Data Reviewed:    Family Communication: no family at the bedside   Disposition: Status is: Inpatient Remains inpatient appropriate because: heart failure   Planned Discharge Destination: Skilled nursing facility      Author: Tawni Millers, MD 07/17/2022 3:34 PM  For on call review [email or web address].

## 2022-07-17 NOTE — Progress Notes (Signed)
Initial Nutrition Assessment  DOCUMENTATION CODES:   Non-severe (moderate) malnutrition in context of chronic illness  INTERVENTION:  Adjust to 2g Na diet to encourage PO intake Ensure Enlive po BID, each supplement provides 350 kcal and 20 grams of protein. MVI with minerals daily  NUTRITION DIAGNOSIS:   Moderate Malnutrition related to chronic illness as evidenced by mild fat depletion, mild muscle depletion.  GOAL:   Patient will meet greater than or equal to 90% of their needs  MONITOR:   I & O's, PO intake, Supplement acceptance  REASON FOR ASSESSMENT:   Consult Assessment of nutrition requirement/status  ASSESSMENT:   Pt with hx of COPD on home O2, CHF, CKD3b, cirrhosis, and hx drug/tobacco abuse presented to ED fron SNF with SOB and swelling.  Pt resting in bed with her eyes closed at the time of assessment. Not very conversant, but will answer questions.    Significant edema noted on exam but based on findings, pt also appears to have lost significant amount of weight, loose hanging skin in several areas of body. Pt states she has not noticed weight loss, but stated "good" when I told her I was concerned she had.   No intake recorded, but pt reports that she had breakfast this AM. Will adjust diet to a more liberal order to ensure pt is meeting her needs. Pt states she has had ensure before.   Nutritionally Relevant Medications: Scheduled Meds:  furosemide  40 mg Intravenous Q12H   predniSONE  40 mg Oral Q breakfast   PRN Meds: lactulose, ondansetron  Labs Reviewed: BUN 50, creatinine 1.79  NUTRITION - FOCUSED PHYSICAL EXAM: Flowsheet Row Most Recent Value  Orbital Region Mild depletion  Upper Arm Region Severe depletion  Thoracic and Lumbar Region No depletion  [edema present]  Buccal Region Mild depletion  Temple Region Mild depletion  Clavicle Bone Region Moderate depletion  Clavicle and Acromion Bone Region Severe depletion  Scapular Bone Region  Mild depletion  Dorsal Hand Mild depletion  Patellar Region No depletion  [fluid]  Anterior Thigh Region No depletion  [fluid]  Posterior Calf Region No depletion  [fluid]  Edema (RD Assessment) Moderate  [abdomen, legs]  Hair Reviewed  Eyes Reviewed  Mouth Reviewed  Skin Reviewed  Nails Unable to assess  [painted]   Diet Order:   Diet Order             Diet Heart Room service appropriate? Yes; Fluid consistency: Thin; Fluid restriction: 1800 mL Fluid  Diet effective now                   EDUCATION NEEDS:   Not appropriate for education at this time  Skin:  Skin Assessment: Reviewed RN Assessment  Last BM:  unsure  Height:   Ht Readings from Last 1 Encounters:  07/17/22 5\' 4"  (1.626 m)    Weight:   Wt Readings from Last 1 Encounters:  07/17/22 79.1 kg    Ideal Body Weight:  54.5 kg  BMI:  Body mass index is 29.93 kg/m.  Estimated Nutritional Needs:  Kcal:  1600-1800 kcal/d Protein:  80-100g/d Fluid:  1.8L/d    Ranell Patrick, RD, LDN Clinical Dietitian RD pager # available in [place]  After hours/weekend pager # available in [facility]

## 2022-07-17 NOTE — Assessment & Plan Note (Signed)
Continue with nutritional supplements.  

## 2022-07-17 NOTE — Evaluation (Signed)
Occupational Therapy Evaluation Patient Details Name: Valerie Rodgers MRN: [id number] DOB: March 16, 1951 Today's Date: 07/17/2022   History of Present Illness Pt is a 72 y/o F presenting to ED on 1/28 from [facility] with SOB, abdominal pain, BLE edema, and weight gain. Admitted for CHF. PMH includes COPD, chronic hypoxic respiratory failure, combined chronic systolic and diastolic CHF, substance abuse, chronic wounds, A fib not on anticoagulation.   Clinical Impression   Pt questionable historian, oriented to self only. From SNF, reports she has assist with ADLs and is in bed often, but staff uses lift to get her to a w/c. Pt currently needing set up -total A for ADLs, max-total A +2 for bed mobility and standing attempts at EOB. SpO2 down to low 70's with poor wave form on 5L, increased to 7L and pt returned to bed. SpO2 increasing to 90's once supine, O2 decreased to 5L, RN notified. Pt presenting with impairments listed below, will follow acutely. Recommend SNF at d/c.     Recommendations for follow up therapy are one component of a multi-disciplinary discharge planning process, led by the attending physician.  Recommendations may be updated based on patient status, additional functional criteria and insurance authorization.   Follow Up Recommendations  Skilled nursing-short term rehab (<3 hours/day)     Assistance Recommended at Discharge Frequent or constant Supervision/Assistance  Patient can return home with the following Two people to help with walking and/or transfers;A lot of help with bathing/dressing/bathroom;Assistance with feeding;Direct supervision/assist for medications management;Direct supervision/assist for financial management;Assist for transportation;Help with stairs or ramp for entrance    Functional Status Assessment  Patient has had a recent decline in their functional status and demonstrates the ability to make significant improvements in function in a reasonable and  predictable amount of time.  Equipment Recommendations  Other (comment) (defer)    Recommendations for Other Services PT consult     Precautions / Restrictions Precautions Precautions: Fall Precaution Comments: watch O2 Restrictions Weight Bearing Restrictions: No      Mobility Bed Mobility Overal bed mobility: Needs Assistance Bed Mobility: Sit to Supine, Supine to Sit     Supine to sit: Max assist, Total assist, +2 for physical assistance Sit to supine: Max assist, Total assist, +2 for physical assistance        Transfers Overall transfer level: Needs assistance Equipment used: Rolling walker (2 wheels) Transfers: Sit to/from Stand Sit to Stand: Total assist, Max assist, +2 physical assistance           General transfer comment: unable to clear hips from EOB      Balance Overall balance assessment: Needs assistance Sitting-balance support: Feet supported, Bilateral upper extremity supported Sitting balance-Leahy Scale: Fair     Standing balance support: During functional activity, Reliant on assistive device for balance Standing balance-Leahy Scale: Zero                             ADL either performed or assessed with clinical judgement   ADL Overall ADL's : Needs assistance/impaired Eating/Feeding: Minimal assistance;Sitting   Grooming: Set up;Wash/dry face;Sitting Grooming Details (indicate cue type and reason): washes face sitting EOB Upper Body Bathing: Moderate assistance   Lower Body Bathing: Maximal assistance   Upper Body Dressing : Moderate assistance   Lower Body Dressing: Maximal assistance   Toilet Transfer: Total assistance;+2 for physical assistance;Maximal assistance   Toileting- Clothing Manipulation and Hygiene: Maximal assistance  Functional mobility during ADLs: Total assistance;Maximal assistance;+2 for physical assistance       Vision   Vision Assessment?: No apparent visual deficits     Perception  Perception Perception Tested?: No   Praxis Praxis Praxis tested?: Not tested    Pertinent Vitals/Pain Pain Assessment Pain Assessment: No/denies pain     Hand Dominance     Extremity/Trunk Assessment Upper Extremity Assessment Upper Extremity Assessment: Generalized weakness   Lower Extremity Assessment Lower Extremity Assessment: Defer to PT evaluation (edematous)       Communication Communication Communication: No difficulties   Cognition Arousal/Alertness: Awake/alert Behavior During Therapy: WFL for tasks assessed/performed Overall Cognitive Status: Impaired/Different from baseline Area of Impairment: Memory, Attention, Following commands, Safety/judgement, Awareness, Problem solving                   Current Attention Level: Focused Memory: Decreased short-term memory Following Commands: Follows one step commands with increased time Safety/Judgement: Decreased awareness of safety, Decreased awareness of deficits Awareness: Intellectual Problem Solving: Slow processing General Comments: perseverative that she is in "[place]" hospital and lives at "[place]", oriented to self     General Comments  SpO2 dropping into low 70's on 5L O2, poor wave form, increased to 7L with minimal improvement. Pt returned to supine and SpO2 reading in low 90's    Exercises     Shoulder Instructions      Home Living Family/patient expects to be discharged to:: Skilled nursing facility ([facility])                                 Additional Comments: pt questionable historian, states she is mainly in the bed when at [facility] and occasionally gets to w/c via lift      Prior Functioning/Environment Prior Level of Function : Needs assist             Mobility Comments: states she gets to a w/c occasionally? unable to identify if she is able to self-propel ADLs Comments: staff assists with all ADLs per pt report        OT Problem List: Decreased  strength;Decreased range of motion;Decreased activity tolerance;Impaired balance (sitting and/or standing);Decreased cognition;Decreased safety awareness;Cardiopulmonary status limiting activity      OT Treatment/Interventions: Self-care/ADL training;Therapeutic exercise;Energy conservation;DME and/or AE instruction;Therapeutic activities;Patient/family education;Balance training    OT Goals(Current goals can be found in the care plan section) Acute Rehab OT Goals Patient Stated Goal: none stated OT Goal Formulation: With patient Time For Goal Achievement: 07/31/22 Potential to Achieve Goals: Good ADL Goals Pt Will Perform Grooming: with modified independence;sitting Pt Will Transfer to Toilet: with mod assist;squat pivot transfer;stand pivot transfer;bedside commode Additional ADL Goal #1: pt will tolerate sitting EOB x10 min with supervision for functional task in prep for ADLs.  OT Frequency: Min 2X/week    Co-evaluation PT/OT/SLP Co-Evaluation/Treatment: Yes Reason for Co-Treatment: To address functional/ADL transfers;For patient/therapist safety;Complexity of the patient's impairments (multi-system involvement)   OT goals addressed during session: ADL's and self-care      AM-PAC OT "6 Clicks" Daily Activity     Outcome Measure Help from another person eating meals?: A Little Help from another person taking care of personal grooming?: A Little Help from another person toileting, which includes using toliet, bedpan, or urinal?: A Lot Help from another person bathing (including washing, rinsing, drying)?: A Lot Help from another person to put on and taking off regular upper body clothing?: A  Lot Help from another person to put on and taking off regular lower body clothing?: A Lot 6 Click Score: 14   End of Session Equipment Utilized During Treatment: Gait belt;Rolling walker (2 wheels);Oxygen (5L) Nurse Communication: Mobility status;Other (comment) (SpO2 drop)  Activity  Tolerance: Patient tolerated treatment well Patient left: in bed;with call bell/phone within reach;with bed alarm set  OT Visit Diagnosis: Unsteadiness on feet (R26.81);Other abnormalities of gait and mobility (R26.89);Muscle weakness (generalized) (M62.81)                Time: 1962-2297 OT Time Calculation (min): 20 min Charges:  OT General Charges $OT Visit: 1 Visit OT Evaluation $OT Eval Moderate Complexity: 1 Mod  Sharina Petre K, OTD, OTR/L SecureChat Preferred Acute Rehab [phone]   Renaye Rakers Koonce 07/17/2022, 12:39 PM

## 2022-07-17 NOTE — Hospital Course (Addendum)
Valerie Rodgers was admitted to the hospital with the working diagnosis of heart failure decompensation.   72 yo female with the past medical history of COPD, heart failure with reduced LV systolic function, CKD, cirrhosis, and paroxysmal atrial fibrillation who presented with dyspnea, and abdominal edema. Reported 3 to 4 days of worsening dyspnea, that progressed to abdominal and lower extremity edema. On the day of hospitalization EMS was called and patient was found in respiratory distress, she received a nebulizer treatment, placed on Bipap and transported to the ED. In the emergency room her blood pressure was 120/95, HR 42 to 60, RR 24 02 saturation 92%, lungs with scattered wheezing and rales bilaterally at mid lung level, heart with S1 and S2 present an regular with no murmurs or gallops, abdomen with no distention and positive lower extremity edema.   Na 142, K 4,2 CL 105, bicarbonate 27 glucose 103 bun 51 cr 1.8  AST 93 ALT 54 BNP >4,500 High sensitive troponin 152 and 148  Lactic acid 2,5  Wbc 5.6 hgb 13.1 plt 131   SARS covid 19 negative Influenza A and B negative   Urine analysis with SG 1.006, negative protein, 0-5 leukocytes.  Toxicology screen negative (was positive for cocaine on 03/2022).   Chest radiograph with cardiomegaly, bilateral hilar vascular congestion and bilateral diffuse interstitial infiltrates.   EKG 69 bpm, normal axis, normal intervals, sinus rhythm with PVC (left ventricle) and PAC, with low voltage with no significant ST segment or T wave changes. (Chronic low voltage).   Patient was placed on furosemide for diuresis.   01/30 volume status is improving with diuresis.

## 2022-07-17 NOTE — Progress Notes (Signed)
Physical Therapy Evaluation Patient Details Name: Valerie Rodgers MRN: [id number] DOB: 1951/04/26 Today's Date: 07/17/2022  History of Present Illness  72 y/o F presenting to ED on 1/28 from [facility] with SOB, abdominal pain, BLE edema, and weight gain. Admitted for CHF. PMH includes COPD, chronic hypoxic respiratory failure, combined chronic systolic and diastolic CHF, substance abuse, chronic wounds, A fib not on anticoagulation.  Clinical Impression  Pt was seen for progressing mobility from the bed to sit and attempted standing.  Pt is unable to exert that effort, but did note her sitting balance improve with practice of two person help to scoot up the bed.  Pt is most definitely SNF level of care given dependence to move, struggle with directing effort to assist and her reluctance to try to stand.  Will focus efforts to recover her sit to stand proficiency, and will recommend her to work on LE strength, to progress with balance and safety with mobility.         Recommendations for follow up therapy are one component of a multi-disciplinary discharge planning process, led by the attending physician.  Recommendations may be updated based on patient status, additional functional criteria and insurance authorization.  Follow Up Recommendations Skilled nursing-short term rehab (<3 hours/day) Can patient physically be transported by private vehicle: No    Assistance Recommended at Discharge Frequent or constant Supervision/Assistance  Patient can return home with the following  Two people to help with walking and/or transfers;A lot of help with bathing/dressing/bathroom;Assistance with cooking/housework;Direct supervision/assist for medications management;Direct supervision/assist for financial management;Assist for transportation;Help with stairs or ramp for entrance    Equipment Recommendations None recommended by PT  Recommendations for Other Services       Functional Status  Assessment Patient has had a recent decline in their functional status and demonstrates the ability to make significant improvements in function in a reasonable and predictable amount of time.     Precautions / Restrictions Precautions Precautions: Fall Precaution Comments: watch O2 Restrictions Weight Bearing Restrictions: No      Mobility  Bed Mobility Overal bed mobility: Needs Assistance Bed Mobility: Sit to Supine, Supine to Sit     Supine to sit: Max assist, Total assist, +2 for physical assistance Sit to supine: Max assist, Total assist, +2 for physical assistance        Transfers Overall transfer level: Needs assistance Equipment used: Rolling walker (2 wheels) Transfers: Sit to/from Stand Sit to Stand: Total assist, +2 physical assistance           General transfer comment: poor effort to try to stand, max of two to scoot up bed    Ambulation/Gait               General Gait Details: deferred over pt inability to stand  Stairs            Wheelchair Mobility    Modified Rankin (Stroke Patients Only)       Balance Overall balance assessment: Needs assistance Sitting-balance support: Feet supported, Bilateral upper extremity supported Sitting balance-Leahy Scale: Fair (once set)     Standing balance support: During functional activity, Reliant on assistive device for balance Standing balance-Leahy Scale: Zero                               Pertinent Vitals/Pain Pain Assessment Pain Assessment: No/denies pain    Home Living Family/patient expects to be discharged to:: Skilled nursing  facility                   Additional Comments: pt cannot recall SNF name and has difficulty giving her history    Prior Function Prior Level of Function : Needs assist       Physical Assist : Mobility (physical) Mobility (physical): Bed mobility;Transfers;Gait   Mobility Comments: assisted per pt mechanically to get to  wheelchair but reports she is not OOB much ADLs Comments: staff assists with all ADLs per pt report     Hand Dominance        Extremity/Trunk Assessment   Upper Extremity Assessment Upper Extremity Assessment: Defer to OT evaluation    Lower Extremity Assessment Lower Extremity Assessment: Generalized weakness    Cervical / Trunk Assessment Cervical / Trunk Assessment: Kyphotic  Communication   Communication: No difficulties  Cognition Arousal/Alertness: Awake/alert Behavior During Therapy: WFL for tasks assessed/performed Overall Cognitive Status: Impaired/Different from baseline Area of Impairment: Memory, Attention, Following commands, Safety/judgement, Awareness, Problem solving                   Current Attention Level: Selective Memory: Decreased short-term memory Following Commands: Follows one step commands with increased time, Follows one step commands inconsistently Safety/Judgement: Decreased awareness of safety, Decreased awareness of deficits Awareness: Intellectual Problem Solving: Slow processing, Requires verbal cues, Requires tactile cues General Comments: perseverative that she is in "[place]" hospital and lives at [address]", oriented to self        General Comments General comments (skin integrity, edema, etc.): clear issues maintaining sats with sitting on side of bed despite which sat monitor was used, tends to drop to as low as 73% sitting and upon returning to bed increased back to 80's, to 90's with a length of recovery.    Exercises     Assessment/Plan    PT Assessment Patient needs continued PT services  PT Problem List         PT Treatment Interventions      PT Goals (Current goals can be found in the Care Plan section)  Acute Rehab PT Goals Patient Stated Goal: none stated x to return to bed PT Goal Formulation: With patient Time For Goal Achievement: 07/31/22 Potential to Achieve Goals: Good    Frequency        Co-evaluation PT/OT/SLP Co-Evaluation/Treatment: Yes Reason for Co-Treatment: For patient/therapist safety;Necessary to address cognition/behavior during functional activity PT goals addressed during session: Mobility/safety with mobility;Balance;Proper use of DME OT goals addressed during session: ADL's and self-care       AM-PAC PT "6 Clicks" Mobility  Outcome Measure Help needed turning from your back to your side while in a flat bed without using bedrails?: Total Help needed moving from lying on your back to sitting on the side of a flat bed without using bedrails?: Total Help needed moving to and from a bed to a chair (including a wheelchair)?: Total Help needed standing up from a chair using your arms (e.g., wheelchair or bedside chair)?: Total Help needed to walk in hospital room?: Total Help needed climbing 3-5 steps with a railing? : Total 6 Click Score: 6    End of Session Equipment Utilized During Treatment: Gait belt;Oxygen Activity Tolerance: Patient limited by fatigue;Treatment limited secondary to medical complications (Comment) Patient left: in bed;with call bell/phone within reach;with bed alarm set Nurse Communication: Mobility status PT Visit Diagnosis: Muscle weakness (generalized) (M62.81);Other abnormalities of gait and mobility (R26.89)    Time: 1610-9604 PT Time Calculation (  min) (ACUTE ONLY): 20 min   Charges:   PT Evaluation $PT Eval Moderate Complexity: 1 Mod         Ramond Dial 07/17/2022, 1:12 PM  Mee Hives, PT PhD Acute Rehab Dept. Number: [place]

## 2022-07-17 NOTE — Assessment & Plan Note (Addendum)
AKI  Renal function with serum cr at 1,65 with K at 3,9 and serum bicarbonate at 28 Na 142 and Mg 2,3   Continue diuresis with furosemide Avoid hypotension and nephrotoxic medications. Follow up renal function and electrolytes in am.

## 2022-07-17 NOTE — TOC Initial Note (Signed)
Transition of Care [facility]) - Initial/Assessment Note    Patient Details  Name: Valerie Rodgers MRN: [id number] Date of Birth: 08-19-1950  [facility] of [facility]) CM/SW Contact:    Bjorn Pippin, LCSW Phone Number: 07/17/2022, 4:06 PM  Clinical Narrative:                 CSW reached out to pt contact list but failed to speak with anyone and left VM. CSW contacted [facility] and they reported pt is LTC there and can return with therapy upon dc and insurance auth. TOC will continue to follow.   Expected Discharge Plan: Skilled Nursing Facility Barriers to Discharge: Continued Medical Work up   Patient Goals and CMS Choice            Expected Discharge Plan and Services       Living arrangements for the past 2 months: [place]                                      Prior Living Arrangements/Services Living arrangements for the past 2 months: [place]            Need for Family Participation in Patient Care: Yes (Comment)     Criminal Activity/Legal Involvement Pertinent to Current Situation/Hospitalization: No - Comment as needed  Activities of Daily Living      Permission Sought/Granted                  Emotional Assessment       Orientation: : Oriented to Self Alcohol / Substance Use: Not Applicable Psych Involvement: No (comment)  Admission diagnosis:  CHF (congestive heart failure) ([place]) [I50.9] Acute on chronic combined systolic and diastolic congestive heart failure ([place]) [I50.43] Patient Active Problem List   Diagnosis Date Noted   Malnutrition of moderate degree 07/17/2022   CHF (congestive heart failure) ([place]) 07/16/2022   Bilateral cellulitis of lower leg    Severe sepsis without septic shock ([place])    Bacteremia    Acute on chronic respiratory failure with hypoxia and hypercapnia ([place]) 03/25/2022   Elevated troponin 03/25/2022   Cellulitis 03/25/2022   Acute encephalopathy 03/25/2022   COPD with  acute exacerbation ([place]) 03/13/2022   Decubitus ulcer of left buttock 03/13/2022   Stage 3a chronic kidney disease (CKD) ([place]) 03/12/2022   Mixed hyperlipidemia 03/12/2022   Nicotine dependence, cigarettes, uncomplicated 27/51/7001   Acute on chronic systolic CHF (congestive heart failure) ([place]) 11/13/2021   Atrial flutter, paroxysmal ([place]) 11/13/2021   Elevated LFTs 08/10/2021   HFrEF (heart failure with reduced ejection fraction) ([place])    Cocaine abuse ([place]) 07/18/2021   Bipolar disorder ([place]) 07/18/2021   Essential hypertension 07/18/2021   PCP:  Merryl Hacker, No Pharmacy:   [place], [address] [place] [address] Phone: [phone] Fax: [phone]  [id number] [place], [address] [address] Phone: [phone] Fax: [phone]  [facility] [address] [address] Phone: [phone] Fax: [phone]     Social Determinants of Health (SDOH) Social History: SDOH Screenings   Tobacco Use: High Risk (05/02/2022)   SDOH Interventions:     Readmission Risk Interventions    03/17/2022    2:55 PM  Readmission Risk Prevention Plan  HRI or Home  Care Consult Complete  Social Work Consult for [place] Planning/Counseling Complete  Palliative Care Screening Not Applicable  Medication Review Press photographer) Referral to Pharmacy   Beckey Rutter, MSW, LCSWA, LCASA Transitions of Care  Clinical Social Worker I

## 2022-07-17 NOTE — Assessment & Plan Note (Signed)
Not on psychotropic medications. She seems to be confused and disorientated, not clear if is her baseline. I suspect cognitive impairment, will call her family for more information.  Continue neuro checks per unit protocol.

## 2022-07-18 DIAGNOSIS — F319 Bipolar disorder, unspecified: Secondary | ICD-10-CM

## 2022-07-18 DIAGNOSIS — I10 Essential (primary) hypertension: Secondary | ICD-10-CM | POA: Diagnosis not present

## 2022-07-18 DIAGNOSIS — I4892 Unspecified atrial flutter: Secondary | ICD-10-CM | POA: Diagnosis not present

## 2022-07-18 DIAGNOSIS — I5023 Acute on chronic systolic (congestive) heart failure: Secondary | ICD-10-CM | POA: Diagnosis not present

## 2022-07-18 DIAGNOSIS — N1831 Chronic kidney disease, stage 3a: Secondary | ICD-10-CM | POA: Diagnosis not present

## 2022-07-18 LAB — BASIC METABOLIC PANEL
Anion gap: 8 (ref 5–15)
BUN: 52 mg/dL — ABNORMAL HIGH (ref 8–23)
CO2: 28 mmol/L (ref 22–32)
Calcium: 8.7 mg/dL — ABNORMAL LOW (ref 8.9–10.3)
Chloride: 106 mmol/L (ref 98–111)
Creatinine, Ser: 1.65 mg/dL — ABNORMAL HIGH (ref 0.44–1.00)
GFR, Estimated: 33 mL/min — ABNORMAL LOW (ref >60)
Glucose, Bld: 138 mg/dL — ABNORMAL HIGH (ref 70–99)
Potassium: 3.9 mmol/L (ref 3.5–5.1)
Sodium: 142 mmol/L (ref 135–145)

## 2022-07-18 LAB — MAGNESIUM: Magnesium: 2.3 mg/dL (ref 1.7–2.4)

## 2022-07-18 NOTE — Progress Notes (Signed)
PROGRESS NOTE    Valerie Rodgers  ZOX:[id number] DOB: 1950-07-17 DOA: 07/16/2022 PCP: Pcp, No  71/F  w/ COPD, heart failure with reduced LV systolic function, CKD 3a, cirrhosis, and paroxysmal atrial fibrillation presented with dyspnea, and abdominal edema x 3 to 4 days of worsening dyspnea, that progressed to abdominal and lower extremity edema. In ED in Resp distress, placed on BIPAP creat 1.8, AST 93 ALT 54, BNP >4,500, troponin 152 and 148 , Toxicology screen negative (was positive for cocaine on 03/2022).  CXR with cardiomegaly, bilateral hilar vascular congestion and bilateral diffuse interstitial infiltrates.   Patient was placed on furosemide for diuresis.    01/30 volume status is improving with diuresis, noted to have some confusion   Subjective:   Assessment and Plan:  Acute on chronic systolic CHF 81/1914 echo with EF 20 to 25%, global hypokinesis. Interventricular septum is flattened in systole and diastole, RV preserved. RVSP 43.9. moderate dilatation of LA and RA. Moderate TR.  -diuresed w/ IV lasix, 1.5L negative -continue midodrine 10 mg tid.  Hold ivabradine due to bradycardia.  -GDMT limited by CKD and hypotension  Elevated liver enzymes due to congestive hepatopathy, continue diuresis.  Acute on chronic hypoxemic respiratory failure due to acute cardiogenic pulmonary edema. -wean O2  Troponin elevation due to heart failure exacerbation, no clinical signs of acute coronary syndrome.   Debility Patient with advanced heart failure and very poor physical functional capacity.  Poor prognosis.   Atrial flutter, paroxysmal ([place]) Currently patient is sinus rhythm. As outpatient not on anticoagulation. Not on anticoagulation on prior hospitalizations.   Stage 3a chronic kidney disease (CKD) (HCC) AKI- Continue diuresis with furosemide Avoid hypotension, monitor BMET  COPD with acute exacerbation (HCC) No clinical signs of exacerbation, continue oxymetry  monitoring.  Discontinue corticosteroids.   Bipolar disorder ([place]) Not on psychotropic medications. She seems to be confused and disorientated, not clear if is her baseline. I suspect cognitive impairment, will call her family for more information.  Continue neuro checks per unit protocol.  Malnutrition of moderate degree Continue with nutritional supplements.    DVT prophylaxis: Heparin Code Status: DNR Family Communication: Disposition Plan:   Consultants:    Procedures:   Antimicrobials:    Objective: Vitals:   07/18/22 0454 07/18/22 0812 07/18/22 1420 07/18/22 1942  BP: 103/79   111/75  Pulse: 72   86  Resp: 16   17  Temp: (!) 97.5 F (36.4 C)   98.3 F (36.8 C)  TempSrc: Axillary   Oral  SpO2: 100% 100% 99% 98%  Weight:      Height:        Intake/Output Summary (Last 24 hours) at 07/18/2022 2008 Last data filed at 07/18/2022 1300 Gross per 24 hour  Intake 66 ml  Output 1150 ml  Net -1084 ml   Filed Weights   07/17/22 0000 07/17/22 0441 07/17/22 2300  Weight: 78 kg 79.1 kg 81.3 kg    Examination:     Data Reviewed:   CBC: Recent Labs  Lab 07/16/22 1251 07/16/22 1410  WBC 5.6  --   NEUTROABS 4.1  --   HGB 13.1 14.3  HCT 40.6 42.0  MCV 85.8  --   PLT 131*  --    Basic Metabolic Panel: Recent Labs  Lab 07/16/22 1251 07/16/22 1410 07/17/22 0600 07/18/22 0117  NA 142 144 143 142  K 4.2 3.9 4.1 3.9  CL 105  --  104 106  CO2 27  --  26  28  GLUCOSE 103*  --  128* 138*  BUN 51*  --  50* 52*  CREATININE 1.82*  --  1.79* 1.65*  CALCIUM 9.3  --  9.0 8.7*  MG  --   --   --  2.3   GFR: Estimated Creatinine Clearance: 31.8 mL/min (A) (by C-G formula based on SCr of 1.65 mg/dL (H)). Liver Function Tests: Recent Labs  Lab 07/16/22 1251 07/17/22 0600  AST 93* 76*  ALT 54* 54*  ALKPHOS 188* 196*  BILITOT 1.4* 1.4*  PROT 6.9 7.1  ALBUMIN 3.6 3.5   Recent Labs  Lab 07/16/22 1251  LIPASE 37   Recent Labs  Lab 07/16/22 1530   AMMONIA 23   Coagulation Profile: Recent Labs  Lab 07/16/22 1251  INR 1.4*   Cardiac Enzymes: No results for input(s): "CKTOTAL", "CKMB", "CKMBINDEX", "TROPONINI" in the last 168 hours. BNP (last 3 results) No results for input(s): "PROBNP" in the last 8760 hours. HbA1C: No results for input(s): "HGBA1C" in the last 72 hours. CBG: No results for input(s): "GLUCAP" in the last 168 hours. Lipid Profile: No results for input(s): "CHOL", "HDL", "LDLCALC", "TRIG", "CHOLHDL", "LDLDIRECT" in the last 72 hours. Thyroid Function Tests: No results for input(s): "TSH", "T4TOTAL", "FREET4", "T3FREE", "THYROIDAB" in the last 72 hours. Anemia Panel: No results for input(s): "VITAMINB12", "FOLATE", "FERRITIN", "TIBC", "IRON", "RETICCTPCT" in the last 72 hours. Urine analysis:    Component Value Date/Time   COLORURINE YELLOW 07/16/2022 1253   APPEARANCEUR HAZY (A) 07/16/2022 1253   LABSPEC 1.006 07/16/2022 1253   PHURINE 5.0 07/16/2022 1253   GLUCOSEU NEGATIVE 07/16/2022 1253   HGBUR MODERATE (A) 07/16/2022 1253   BILIRUBINUR NEGATIVE 07/16/2022 1253   KETONESUR NEGATIVE 07/16/2022 1253   PROTEINUR NEGATIVE 07/16/2022 1253   NITRITE NEGATIVE 07/16/2022 1253   LEUKOCYTESUR LARGE (A) 07/16/2022 1253   Sepsis Labs: @LABRCNTIP (procalcitonin:4,lacticidven:4)  ) Recent Results (from the past 240 hour(s))  Resp panel by RT-PCR (RSV, Flu A&B, Covid) Anterior Nasal Swab     Status: None   Collection Time: 07/16/22  1:08 PM   Specimen: Anterior Nasal Swab  Result Value Ref Range Status   SARS Coronavirus 2 by RT PCR NEGATIVE NEGATIVE Final    Comment: (NOTE) SARS-CoV-2 target nucleic acids are NOT DETECTED.  The SARS-CoV-2 RNA is generally detectable in upper respiratory specimens during the acute phase of infection. The lowest concentration of SARS-CoV-2 viral copies this assay can detect is 138 copies/mL. A negative result does not preclude SARS-Cov-2 infection and should not be used  as the sole basis for treatment or other patient management decisions. A negative result may occur with  improper specimen collection/handling, submission of specimen other than nasopharyngeal swab, presence of viral mutation(s) within the areas targeted by this assay, and inadequate number of viral copies(<138 copies/mL). A negative result must be combined with clinical observations, patient history, and epidemiological information. The expected result is Negative.  Fact Sheet for Patients:  EntrepreneurPulse.com.au  Fact Sheet for Healthcare Providers:  IncredibleEmployment.be  This test is no t yet approved or cleared by the [place] FDA and  has been authorized for detection and/or diagnosis of SARS-CoV-2 by FDA under an Emergency Use Authorization (EUA). This EUA will remain  in effect (meaning this test can be used) for the duration of the COVID-19 declaration under Section 564(b)(1) of the Act, 21 U.S.C.section 360bbb-3(b)(1), unless the authorization is terminated  or revoked sooner.       Influenza A by PCR NEGATIVE NEGATIVE  Final   Influenza B by PCR NEGATIVE NEGATIVE Final    Comment: (NOTE) The Xpert Xpress SARS-CoV-2/FLU/RSV plus assay is intended as an aid in the diagnosis of influenza from Nasopharyngeal swab specimens and should not be used as a sole basis for treatment. Nasal washings and aspirates are unacceptable for Xpert Xpress SARS-CoV-2/FLU/RSV testing.  Fact Sheet for Patients: EntrepreneurPulse.com.au  Fact Sheet for Healthcare Providers: IncredibleEmployment.be  This test is not yet approved or cleared by the [place] FDA and has been authorized for detection and/or diagnosis of SARS-CoV-2 by FDA under an Emergency Use Authorization (EUA). This EUA will remain in effect (meaning this test can be used) for the duration of the COVID-19 declaration under Section 564(b)(1)  of the Act, 21 U.S.C. section 360bbb-3(b)(1), unless the authorization is terminated or revoked.     Resp Syncytial Virus by PCR NEGATIVE NEGATIVE Final    Comment: (NOTE) Fact Sheet for Patients: EntrepreneurPulse.com.au  Fact Sheet for Healthcare Providers: IncredibleEmployment.be  This test is not yet approved or cleared by the [place] FDA and has been authorized for detection and/or diagnosis of SARS-CoV-2 by FDA under an Emergency Use Authorization (EUA). This EUA will remain in effect (meaning this test can be used) for the duration of the COVID-19 declaration under Section 564(b)(1) of the Act, 21 U.S.C. section 360bbb-3(b)(1), unless the authorization is terminated or revoked.  Performed at [facility], [place] [address] [place], [place] [address]      Radiology Studies: No results found.   Scheduled Meds:  feeding supplement  237 mL Oral BID BM   furosemide  80 mg Intravenous Q12H   heparin  5,000 Units Subcutaneous Q12H   ipratropium-albuterol  3 mL Nebulization TID   midodrine  10 mg Oral TID WC   mometasone-formoterol  2 puff Inhalation BID   multivitamin with minerals  1 tablet Oral Daily   sodium chloride flush  3 mL Intravenous Q12H   Continuous Infusions:  sodium chloride       LOS: 2 days    Time spent: 7min    Domenic Polite, MD [facility]   07/18/2022, 8:08 PM

## 2022-07-18 NOTE — Plan of Care (Signed)

## 2022-07-18 NOTE — Progress Notes (Signed)
Progress Note   Patient: Valerie Rodgers TDD:[id number] DOB: 10/25/1950 DOA: 07/16/2022     2 DOS: the patient was seen and examined on 07/18/2022   Brief hospital course: Valerie Rodgers was admitted to the hospital with the working diagnosis of heart failure decompensation.   72 yo female with the past medical history of COPD, heart failure with reduced LV systolic function, CKD, cirrhosis, and paroxysmal atrial fibrillation who presented with dyspnea, and abdominal edema. Reported 3 to 4 days of worsening dyspnea, that progressed to abdominal and lower extremity edema. On the day of hospitalization EMS was called and patient was found in respiratory distress, she received a nebulizer treatment, placed on Bipap and transported to the ED. In the emergency room her blood pressure was 120/95, HR 42 to 60, RR 24 02 saturation 92%, lungs with scattered wheezing and rales bilaterally at mid lung level, heart with S1 and S2 present an regular with no murmurs or gallops, abdomen with no distention and positive lower extremity edema.   Na 142, K 4,2 CL 105, bicarbonate 27 glucose 103 bun 51 cr 1.8  AST 93 ALT 54 BNP >4,500 High sensitive troponin 152 and 148  Lactic acid 2,5  Wbc 5.6 hgb 13.1 plt 131   SARS covid 19 negative Influenza A and B negative   Urine analysis with SG 1.006, negative protein, 0-5 leukocytes.  Toxicology screen negative (was positive for cocaine on 03/2022).   Chest radiograph with cardiomegaly, bilateral hilar vascular congestion and bilateral diffuse interstitial infiltrates.   EKG 69 bpm, normal axis, normal intervals, sinus rhythm with PVC (left ventricle) and PAC, with low voltage with no significant ST segment or T wave changes. (Chronic low voltage).   Patient was placed on furosemide for diuresis.   01/30 volume status is improving with diuresis.    Assessment and Plan: Acute on chronic systolic CHF (congestive heart failure) ([place]) 03/2022 echocardiogram with  reduced LV systolic function with EF 20 to 25%, global hypokinesis. Interventricular septum is flattened in systole and diastole, RV systolic function is preserved. RVSP 43.9. moderate dilatation of LA and RA. Moderate TR.   Urine output is documented at 6,237 cc Systolic blood pressure 628 to 107 mmHg.   Continue diuresis with furosemide 80 mg IV q12 hrs.  Blood pressure support with midodrine 10 mg tid.  Hold ivabradine due to bradycardia.  Limited pharmacologic options due to reduced GFR.   Elevated liver enzymes due to congestive hepatopathy, continue diuresis.  Acute on chronic hypoxemic respiratory failure due to acute cardiogenic pulmonary edema. Continue supplemental 02 per [place] and diuresis.   Troponin elevation due to heart failure exacerbation, no clinical signs of acute coronary syndrome.   Essential hypertension Continue blood pressure support with midodrine  Patient with advanced heart failure and very poor physical functional capacity.  Poor prognosis.   Atrial flutter, paroxysmal ([place]) Currently patient is sinus rhythm. As outpatient not on anticoagulation. Not on anticoagulation on prior hospitalizations.    Stage 3a chronic kidney disease (CKD) (HCC) AKI  Renal function with serum cr at 1,65 with K at 3,9 and serum bicarbonate at 28 Na 142 and Mg 2,3   Continue diuresis with furosemide Avoid hypotension and nephrotoxic medications. Follow up renal function and electrolytes in am.   COPD with acute exacerbation (HCC) No clinical signs of exacerbation, continue oxymetry monitoring.  Discontinue corticosteroids.   Bipolar disorder ([place]) Not on psychotropic medications. She seems to be confused and disorientated, not clear if is her baseline.  I suspect cognitive impairment, will call her family for more information.  Continue neuro checks per unit protocol.  Malnutrition of moderate degree Continue with nutritional supplements.        Subjective:  Valerie Rodgers is more reactive than yesterday, still not following commands, and only responding to yes and no questions. I suspect is her baseline   Physical Exam: Vitals:   07/17/22 2003 07/17/22 2300 07/18/22 0454 07/18/22 0812  BP:   103/79   Pulse:   72   Resp:   16   Temp:   (!) 97.5 F (36.4 C)   TempSrc:   Axillary   SpO2: 94%  100% 100%  Weight:  81.3 kg    Height:       Neurology awake and alert, confused and disorientated, not agitated, responds to yes and no questions ENT with mild pallor Cardiovascular with S1 and S2 present and regular with no gallops, rubs or murmurs Respiratory with no wheezing or rhonchi Abdomen with no distention  Positive pitting edema ++ at the thighs with trace edema in the distal legs, positive leg discoloration consistent with peripheral vascular disease,  Data Reviewed:    Family Communication: no family at the bedside   Disposition: Status is: Inpatient Remains inpatient appropriate because: IV diuresis   Planned Discharge Destination: Skilled nursing facility    Author: Tawni Millers, MD 07/18/2022 12:26 PM  For on call review [email or web address].

## 2022-07-19 ENCOUNTER — Encounter (HOSPITAL_COMMUNITY): Payer: Self-pay | Admitting: Internal Medicine

## 2022-07-19 DIAGNOSIS — I5043 Acute on chronic combined systolic (congestive) and diastolic (congestive) heart failure: Secondary | ICD-10-CM | POA: Diagnosis not present

## 2022-07-19 LAB — COMPREHENSIVE METABOLIC PANEL
ALT: 46 U/L — ABNORMAL HIGH (ref 0–44)
AST: 45 U/L — ABNORMAL HIGH (ref 15–41)
Albumin: 3.3 g/dL — ABNORMAL LOW (ref 3.5–5.0)
Alkaline Phosphatase: 176 U/L — ABNORMAL HIGH (ref 38–126)
Anion gap: 8 (ref 5–15)
BUN: 52 mg/dL — ABNORMAL HIGH (ref 8–23)
CO2: 32 mmol/L (ref 22–32)
Calcium: 8.9 mg/dL (ref 8.9–10.3)
Chloride: 103 mmol/L (ref 98–111)
Creatinine, Ser: 1.62 mg/dL — ABNORMAL HIGH (ref 0.44–1.00)
GFR, Estimated: 34 mL/min — ABNORMAL LOW (ref >60)
Glucose, Bld: 95 mg/dL (ref 70–99)
Potassium: 3.5 mmol/L (ref 3.5–5.1)
Sodium: 143 mmol/L (ref 135–145)
Total Bilirubin: 1.6 mg/dL — ABNORMAL HIGH (ref 0.3–1.2)
Total Protein: 6.4 g/dL — ABNORMAL LOW (ref 6.5–8.1)

## 2022-07-19 LAB — CBC
HCT: 36.3 % (ref 36.0–46.0)
Hemoglobin: 11 g/dL — ABNORMAL LOW (ref 12.0–15.0)
MCH: 26.3 pg (ref 26.0–34.0)
MCHC: 30.3 g/dL (ref 30.0–36.0)
MCV: 86.6 fL (ref 80.0–100.0)
Platelets: 114 10*3/uL — ABNORMAL LOW (ref 150–400)
RBC: 4.19 MIL/uL (ref 3.87–5.11)
RDW: 19.7 % — ABNORMAL HIGH (ref 11.5–15.5)
WBC: 8.9 10*3/uL (ref 4.0–10.5)
nRBC: 0.7 % — ABNORMAL HIGH (ref 0.0–0.2)

## 2022-07-19 LAB — AMMONIA: Ammonia: 22 umol/L (ref 9–35)

## 2022-07-19 LAB — VITAMIN B12: Vitamin B-12: 1269 pg/mL — ABNORMAL HIGH (ref 180–914)

## 2022-07-19 LAB — TSH: TSH: 4.856 u[IU]/mL — ABNORMAL HIGH (ref 0.350–4.500)

## 2022-07-19 MED ORDER — METOLAZONE 2.5 MG PO TABS
2.5000 mg | ORAL_TABLET | Freq: Once | ORAL | Status: AC
  Administered 2022-07-19: 2.5 mg via ORAL
  Filled 2022-07-19: qty 1

## 2022-07-19 MED ORDER — POTASSIUM CHLORIDE CRYS ER 20 MEQ PO TBCR
40.0000 meq | EXTENDED_RELEASE_TABLET | Freq: Two times a day (BID) | ORAL | Status: AC
  Administered 2022-07-19 (×2): 40 meq via ORAL
  Filled 2022-07-19 (×2): qty 2

## 2022-07-19 MED ORDER — IPRATROPIUM-ALBUTEROL 0.5-2.5 (3) MG/3ML IN SOLN: 3.0000 mL | Freq: Four times a day (QID) | RESPIRATORY_TRACT | Status: DC | PRN

## 2022-07-19 MED ORDER — MELATONIN 5 MG PO TABS
10.0000 mg | ORAL_TABLET | Freq: Every evening | ORAL | Status: DC | PRN
  Administered 2022-07-19 – 2022-07-23 (×3): 10 mg via ORAL
  Filled 2022-07-19 (×3): qty 2

## 2022-07-19 NOTE — Plan of Care (Signed)
  Problem: Education: Goal: Knowledge of General Education information will improve Description: Including pain rating scale, medication(s)/side effects and non-pharmacologic comfort measures Outcome: Progressing   Problem: Clinical Measurements: Goal: Respiratory complications will improve Outcome: Progressing   Problem: Clinical Measurements: Goal: Cardiovascular complication will be avoided Outcome: Progressing   Problem: Coping: Goal: Level of anxiety will decrease Outcome: Progressing   Problem: Pain Managment: Goal: General experience of comfort will improve Outcome: Progressing   Problem: Safety: Goal: Ability to remain free from injury will improve Outcome: Progressing   Problem: Skin Integrity: Goal: Risk for impaired skin integrity will decrease Outcome: Progressing

## 2022-07-19 NOTE — Progress Notes (Signed)
Physical Therapy Treatment Patient Details Name: Valerie Rodgers MRN: [id number] DOB: Feb 14, 1951 Today's Date: 07/19/2022   History of Present Illness 72 y/o F presenting to ED on 1/28 from [facility] with SOB, abdominal pain, BLE edema, and weight gain. Admitted for CHF. PMH includes COPD, chronic hypoxic respiratory failure, combined chronic systolic and diastolic CHF, substance abuse, chronic wounds, A fib not on anticoagulation.    PT Comments    Pt continues to be very confused. Constantly placing [place] in mouth like a bridle and, reporting she is a horse. On entry pt request to use BSC. Pt limited in safe mobility by decreased cognition, in presence of increased O2 demand, DoE, decreased strength, and balance. Pt is total A for bed mobility and maxA(maxAx2) for transfers. D/c plans remain appropriate at this time. PT will continue to follow acutely.   Recommendations for follow up therapy are one component of a multi-disciplinary discharge planning process, led by the attending physician.  Recommendations may be updated based on patient status, additional functional criteria and insurance authorization.  Follow Up Recommendations  Skilled nursing-short term rehab (<3 hours/day) Can patient physically be transported by private vehicle: No   Assistance Recommended at Discharge Frequent or constant Supervision/Assistance  Patient can return home with the following Two people to help with walking and/or transfers;A lot of help with bathing/dressing/bathroom;Assistance with cooking/housework;Direct supervision/assist for medications management;Direct supervision/assist for financial management;Assist for transportation;Help with stairs or ramp for entrance   Equipment Recommendations  None recommended by PT       Precautions / Restrictions Precautions Precautions: Fall Precaution Comments: watch O2 Restrictions Weight Bearing Restrictions: No     Mobility  Bed Mobility Overal bed  mobility: Needs Assistance Bed Mobility: Sit to Supine, Supine to Sit     Supine to sit: Max assist, HOB elevated Sit to supine: Total assist   General bed mobility comments: Pt with need to use the [facility] and able to focus on coming to EOB to transfer to [facility], needs maximal multimodal cuing and max physical A to come to EoB. pt with less focus on return to bed and requires total A to bring LE into bed    Transfers Overall transfer level: Needs assistance   Transfers: Sit to/from Stand, Bed to chair/wheelchair/BSC Sit to Stand: Max assist, +2 safety/equipment Stand pivot transfers: Max assist Step pivot transfers: Mod assist       General transfer comment: maxA for coming to standing, vc for hand placement on PT arm and reaching for BSC arm requires max A for pivoting to BSC. Once finished she requires maxAx2 for standing as RN provides pericare. ModA for stepping "dancing" back to bed    Ambulation/Gait               General Gait Details: pt unable to command follow for ambulation         Balance Overall balance assessment: Needs assistance Sitting-balance support: Feet supported, Bilateral upper extremity supported Sitting balance-Leahy Scale: Fair (once set)     Standing balance support: During functional activity, Reliant on assistive device for balance Standing balance-Leahy Scale: Zero                              Cognition Arousal/Alertness: Awake/alert Behavior During Therapy: WFL for tasks assessed/performed Overall Cognitive Status: Impaired/Different from baseline Area of Impairment: Memory, Attention, Following commands, Safety/judgement, Awareness, Problem solving  Current Attention Level: Selective Memory: Decreased short-term memory Following Commands: Follows one step commands with increased time, Follows one step commands inconsistently Safety/Judgement: Decreased awareness of safety, Decreased awareness of  deficits Awareness: Intellectual Problem Solving: Slow processing, Requires verbal cues, Requires tactile cues General Comments: oriented to self, decreased command follow, thinks her O2 tube is a bridle and continuously puts it into her mouth stating she is "a horse"           General Comments General comments (skin integrity, edema, etc.): on 5L O2 via [place] secondary to pt with poor compliance with maintaining [place], keeps referring to it as a "horse string" and placing it her mouth like a bridle, When correctly placed in nose SpO2 98%O2      Pertinent Vitals/Pain Pain Assessment Pain Assessment: No/denies pain    Home Living Family/patient expects to be discharged to:: Skilled nursing facility                   Additional Comments: pt cannot recall SNF name and has difficulty giving her history    Prior Function            PT Goals (current goals can now be found in the care plan section) Acute Rehab PT Goals Patient Stated Goal: none stated x to return to bed PT Goal Formulation: With patient Time For Goal Achievement: 07/31/22 Potential to Achieve Goals: Good Progress towards PT goals: Progressing toward goals    Frequency           PT Plan Current plan remains appropriate       AM-PAC PT "6 Clicks" Mobility   Outcome Measure  Help needed turning from your back to your side while in a flat bed without using bedrails?: Total Help needed moving from lying on your back to sitting on the side of a flat bed without using bedrails?: Total Help needed moving to and from a bed to a chair (including a wheelchair)?: Total Help needed standing up from a chair using your arms (e.g., wheelchair or bedside chair)?: Total Help needed to walk in hospital room?: Total Help needed climbing 3-5 steps with a railing? : Total 6 Click Score: 6    End of Session Equipment Utilized During Treatment: Gait belt;Oxygen Activity Tolerance: Patient limited by fatigue;Other  (comment) (increaed O2 demand) Patient left: in bed;with call bell/phone within reach;with bed alarm set Nurse Communication: Mobility status PT Visit Diagnosis: Muscle weakness (generalized) (M62.81);Other abnormalities of gait and mobility (R26.89)     Time: 1410-1500 PT Time Calculation (min) (ACUTE ONLY): 50 min  Charges:  $Therapeutic Activity: 38-52 mins                     Julianne Chamberlin B. Migdalia Dk PT, DPT Acute Rehabilitation Services Please use secure chat or  Call Office [phone]    [place] 07/19/2022, 4:01 PM

## 2022-07-20 DIAGNOSIS — I5043 Acute on chronic combined systolic (congestive) and diastolic (congestive) heart failure: Secondary | ICD-10-CM | POA: Diagnosis not present

## 2022-07-20 LAB — BASIC METABOLIC PANEL
Anion gap: 10 (ref 5–15)
BUN: 51 mg/dL — ABNORMAL HIGH (ref 8–23)
CO2: 30 mmol/L (ref 22–32)
Calcium: 9 mg/dL (ref 8.9–10.3)
Chloride: 100 mmol/L (ref 98–111)
Creatinine, Ser: 1.49 mg/dL — ABNORMAL HIGH (ref 0.44–1.00)
GFR, Estimated: 37 mL/min — ABNORMAL LOW (ref >60)
Glucose, Bld: 112 mg/dL — ABNORMAL HIGH (ref 70–99)
Potassium: 3.4 mmol/L — ABNORMAL LOW (ref 3.5–5.1)
Sodium: 140 mmol/L (ref 135–145)

## 2022-07-20 LAB — MAGNESIUM: Magnesium: 1.9 mg/dL (ref 1.7–2.4)

## 2022-07-20 MED ORDER — MIDODRINE HCL 5 MG PO TABS
10.0000 mg | ORAL_TABLET | Freq: Three times a day (TID) | ORAL | Status: DC
  Administered 2022-07-20 – 2022-07-26 (×17): 10 mg via ORAL
  Filled 2022-07-20 (×19): qty 2

## 2022-07-20 MED ORDER — FUROSEMIDE 10 MG/ML IJ SOLN: 60.0000 mg | Freq: Once | INTRAMUSCULAR | Status: DC

## 2022-07-20 MED ORDER — POTASSIUM CHLORIDE CRYS ER 20 MEQ PO TBCR
40.0000 meq | EXTENDED_RELEASE_TABLET | Freq: Two times a day (BID) | ORAL | Status: AC
  Administered 2022-07-20 (×2): 40 meq via ORAL
  Filled 2022-07-20 (×2): qty 2

## 2022-07-20 MED ORDER — TRAZODONE HCL 50 MG PO TABS
50.0000 mg | ORAL_TABLET | Freq: Every evening | ORAL | Status: DC
  Administered 2022-07-20 – 2022-07-25 (×6): 50 mg via ORAL
  Filled 2022-07-20 (×6): qty 1

## 2022-07-20 MED ORDER — POTASSIUM CHLORIDE CRYS ER 20 MEQ PO TBCR
40.0000 meq | EXTENDED_RELEASE_TABLET | Freq: Once | ORAL | Status: AC
  Administered 2022-07-20: 40 meq via ORAL
  Filled 2022-07-20: qty 2

## 2022-07-20 NOTE — Progress Notes (Signed)
PROGRESS NOTE    Valerie Rodgers  TGG:[id number] DOB: 1951-04-21 DOA: 07/16/2022 PCP: Pcp, No  71/F  w/ COPD, chronic systolic CHF, cognitive deficits/dementia, CKD 3a, cirrhosis, bipolar disorder, polysubstance/cocaine abuse and paroxysmal atrial fibrillation long-term care resident at [facility] SN[facility] presented with dyspnea, and abdominal edema x 3 to 4 days of worsening dyspnea, that progressed to abdominal and lower extremity edema. In ED in Resp distress, placed on BIPAP, creat 1.8, AST 93 ALT 54, BNP >4,500, troponin 152 and 148 , Toxicology screen negative (was positive for cocaine on 03/2022).  CXR with cardiomegaly, bilateral hilar vascular congestion and bilateral diffuse interstitial infiltrates.   1/30 volume status improving with diuresis, noted to have cognitive deficits   Subjective: Still hypoxic, could not be weaned down from 5 L this morning, patient reports that her breathing is stable   Assessment and Plan:  Acute on chronic systolic CHF 70/3500 echo with EF 20 to 25%, global hypokinesis. Interventricular septum is flattened in systole and diastole, RV preserved. RVSP 43.9. moderate dilatation of LA and RA. Moderate TR. albumin is 3.3 -Diuresing on IV Lasix she is 5.6 L negative, brisk response to metolazone yesterday  -continue midodrine 10 mg tid.  -Restart ivabradine tomorrow -GDMT limited by CKD and hypotension  Elevated liver enzymes due to congestive hepatopathy, continue diuresis.  Acute on chronic hypoxemic respiratory failure due to acute cardiogenic pulmonary edema. -wean O2  Troponin elevation due to heart failure exacerbation, no clinical signs of acute coronary syndrome.   ?  Dementia, cognitive deficits and debility Patient with advanced heart failure and very poor physical functional capacity.  Poor prognosis.   Atrial flutter, paroxysmal ([place]) Currently patient is sinus rhythm.  Per chart review patient has declined anticoagulation -Unclear of  this diagnosis, not seen in cards notes  AKI-on CKD 3a Continue diuresis with furosemide Avoid hypotension, monitor BMET  COPD with acute exacerbation (HCC) No clinical signs of exacerbation, continue oxymetry monitoring.  Discontinue corticosteroids.   Bipolar disorder ([place]) With considerable cognitive deficits,?  Early dementia -B12 normal, TSH mildly elevated  Malnutrition of moderate degree Continue with nutritional supplements.    DVT prophylaxis: Heparin Code Status: DNR Family Communication: None present Disposition Plan: Back to [facility] for long-term care likely 48 hours  Consultants:    Procedures:   Antimicrobials:    Objective: Vitals:   07/19/22 2028 07/19/22 2052 07/20/22 0353 07/20/22 0747  BP: 99/73 104/80 103/76   Pulse: 96 97 88   Resp: 16 (!) 21 18   Temp: 98.3 F (36.8 C)  98.3 F (36.8 C)   TempSrc: Oral  Oral   SpO2: 100% 100% 100% 100%  Weight:   76.1 kg   Height:        Intake/Output Summary (Last 24 hours) at 07/20/2022 1151 Last data filed at 07/20/2022 0600 Gross per 24 hour  Intake 720 ml  Output 4800 ml  Net -4080 ml   Filed Weights   07/17/22 2300 07/19/22 0356 07/20/22 0353  Weight: 81.3 kg 79 kg 76.1 kg    Examination:  Pleasant elderly female laying in bed, awake alert oriented to self and place, moderate cognitive deficits CVS: S1-S2, regular rhythm Lungs: Decreased breath sounds at the bases, few scattered rhonchi Abdomen: Soft, nontender, bowel sounds present Extremities: No edema   Data Reviewed:   CBC: Recent Labs  Lab 07/16/22 1251 07/16/22 1410 07/19/22 0102  WBC 5.6  --  8.9  NEUTROABS 4.1  --   --   HGB  13.1 14.3 11.0*  HCT 40.6 42.0 36.3  MCV 85.8  --  86.6  PLT 131*  --  683*   Basic Metabolic Panel: Recent Labs  Lab 07/16/22 1251 07/16/22 1410 07/17/22 0600 07/18/22 0117 07/19/22 0102 07/20/22 0051  NA 142 144 143 142 143 140  K 4.2 3.9 4.1 3.9 3.5 3.4*  CL 105  --  104 106 103  100  CO2 27  --  26 28 32 30  GLUCOSE 103*  --  128* 138* 95 112*  BUN 51*  --  50* 52* 52* 51*  CREATININE 1.82*  --  1.79* 1.65* 1.62* 1.49*  CALCIUM 9.3  --  9.0 8.7* 8.9 9.0  MG  --   --   --  2.3  --  1.9   GFR: Estimated Creatinine Clearance: 34.1 mL/min (A) (by C-G formula based on SCr of 1.49 mg/dL (H)). Liver Function Tests: Recent Labs  Lab 07/16/22 1251 07/17/22 0600 07/19/22 0102  AST 93* 76* 45*  ALT 54* 54* 46*  ALKPHOS 188* 196* 176*  BILITOT 1.4* 1.4* 1.6*  PROT 6.9 7.1 6.4*  ALBUMIN 3.6 3.5 3.3*   Recent Labs  Lab 07/16/22 1251  LIPASE 37   Recent Labs  Lab 07/16/22 1530 07/19/22 0102  AMMONIA 23 22   Coagulation Profile: Recent Labs  Lab 07/16/22 1251  INR 1.4*   Cardiac Enzymes: No results for input(s): "CKTOTAL", "CKMB", "CKMBINDEX", "TROPONINI" in the last 168 hours. BNP (last 3 results) No results for input(s): "PROBNP" in the last 8760 hours. HbA1C: No results for input(s): "HGBA1C" in the last 72 hours. CBG: No results for input(s): "GLUCAP" in the last 168 hours. Lipid Profile: No results for input(s): "CHOL", "HDL", "LDLCALC", "TRIG", "CHOLHDL", "LDLDIRECT" in the last 72 hours. Thyroid Function Tests: Recent Labs    07/19/22 0101  TSH 4.856*   Anemia Panel: No results for input(s): "VITAMINB12", "FOLATE", "FERRITIN", "TIBC", "IRON", "RETICCTPCT" in the last 72 hours. Urine analysis:    Component Value Date/Time   COLORURINE YELLOW 07/16/2022 1253   APPEARANCEUR HAZY (A) 07/16/2022 1253   LABSPEC 1.006 07/16/2022 1253   PHURINE 5.0 07/16/2022 1253   GLUCOSEU NEGATIVE 07/16/2022 1253   HGBUR MODERATE (A) 07/16/2022 1253   BILIRUBINUR NEGATIVE 07/16/2022 1253   KETONESUR NEGATIVE 07/16/2022 1253   PROTEINUR NEGATIVE 07/16/2022 1253   NITRITE NEGATIVE 07/16/2022 1253   LEUKOCYTESUR LARGE (A) 07/16/2022 1253   Sepsis Labs: @LABRCNTIP (procalcitonin:4,lacticidven:4)  ) Recent Results (from the past 240 hour(s))  Resp  panel by RT-PCR (RSV, Flu A&B, Covid) Anterior Nasal Swab     Status: None   Collection Time: 07/16/22  1:08 PM   Specimen: Anterior Nasal Swab  Result Value Ref Range Status   SARS Coronavirus 2 by RT PCR NEGATIVE NEGATIVE Final    Comment: (NOTE) SARS-CoV-2 target nucleic acids are NOT DETECTED.  The SARS-CoV-2 RNA is generally detectable in upper respiratory specimens during the acute phase of infection. The lowest concentration of SARS-CoV-2 viral copies this assay can detect is 138 copies/mL. A negative result does not preclude SARS-Cov-2 infection and should not be used as the sole basis for treatment or other patient management decisions. A negative result may occur with  improper specimen collection/handling, submission of specimen other than nasopharyngeal swab, presence of viral mutation(s) within the areas targeted by this assay, and inadequate number of viral copies(<138 copies/mL). A negative result must be combined with clinical observations, patient history, and epidemiological information. The expected result is Negative.  Fact Sheet for Patients:  EntrepreneurPulse.com.au  Fact Sheet for Healthcare Providers:  IncredibleEmployment.be  This test is no t yet approved or cleared by the [place] FDA and  has been authorized for detection and/or diagnosis of SARS-CoV-2 by FDA under an Emergency Use Authorization (EUA). This EUA will remain  in effect (meaning this test can be used) for the duration of the COVID-19 declaration under Section 564(b)(1) of the Act, 21 U.S.C.section 360bbb-3(b)(1), unless the authorization is terminated  or revoked sooner.       Influenza A by PCR NEGATIVE NEGATIVE Final   Influenza B by PCR NEGATIVE NEGATIVE Final    Comment: (NOTE) The Xpert Xpress SARS-CoV-2/FLU/RSV plus assay is intended as an aid in the diagnosis of influenza from Nasopharyngeal swab specimens and should not be used as a  sole basis for treatment. Nasal washings and aspirates are unacceptable for Xpert Xpress SARS-CoV-2/FLU/RSV testing.  Fact Sheet for Patients: EntrepreneurPulse.com.au  Fact Sheet for Healthcare Providers: IncredibleEmployment.be  This test is not yet approved or cleared by the [place] FDA and has been authorized for detection and/or diagnosis of SARS-CoV-2 by FDA under an Emergency Use Authorization (EUA). This EUA will remain in effect (meaning this test can be used) for the duration of the COVID-19 declaration under Section 564(b)(1) of the Act, 21 U.S.C. section 360bbb-3(b)(1), unless the authorization is terminated or revoked.     Resp Syncytial Virus by PCR NEGATIVE NEGATIVE Final    Comment: (NOTE) Fact Sheet for Patients: EntrepreneurPulse.com.au  Fact Sheet for Healthcare Providers: IncredibleEmployment.be  This test is not yet approved or cleared by the [place] FDA and has been authorized for detection and/or diagnosis of SARS-CoV-2 by FDA under an Emergency Use Authorization (EUA). This EUA will remain in effect (meaning this test can be used) for the duration of the COVID-19 declaration under Section 564(b)(1) of the Act, 21 U.S.C. section 360bbb-3(b)(1), unless the authorization is terminated or revoked.  Performed at [facility], [place] [address], [place], [place] [address]      Radiology Studies: No results found.   Scheduled Meds:  feeding supplement  237 mL Oral BID BM   furosemide  80 mg Intravenous Q12H   heparin  5,000 Units Subcutaneous Q12H   midodrine  10 mg Oral TID WC   mometasone-formoterol  2 puff Inhalation BID   multivitamin with minerals  1 tablet Oral Daily   potassium chloride  40 mEq Oral BID   sodium chloride flush  3 mL Intravenous Q12H   Continuous Infusions:  sodium chloride       LOS: 4 days    Time spent: 67min    Domenic Polite, MD [facility]   07/20/2022, 11:51 AM

## 2022-07-20 NOTE — Progress Notes (Signed)
This nurse was cleaning up patient and found that the patient's abdomen increased in size and her left breast was 3 to 4 time the size of her right breast. Patient's abdomen was distended and firm. Notified MD, new orders received.

## 2022-07-20 NOTE — Care Management Important Message (Signed)
Important Message  Patient Details  Name: Valerie Rodgers MRN: [id number] Date of Birth: 02-22-1951   Medicare Important Message Given:  Yes     Samie, [place] 07/20/2022, 4:10 PM

## 2022-07-21 ENCOUNTER — Inpatient Hospital Stay (HOSPITAL_COMMUNITY): Payer: 59

## 2022-07-21 DIAGNOSIS — I5043 Acute on chronic combined systolic (congestive) and diastolic (congestive) heart failure: Secondary | ICD-10-CM | POA: Diagnosis not present

## 2022-07-21 HISTORY — PX: IR PARACENTESIS: IMG2679

## 2022-07-21 LAB — BASIC METABOLIC PANEL
Anion gap: 10 (ref 5–15)
BUN: 47 mg/dL — ABNORMAL HIGH (ref 8–23)
CO2: 35 mmol/L — ABNORMAL HIGH (ref 22–32)
Calcium: 9.1 mg/dL (ref 8.9–10.3)
Chloride: 100 mmol/L (ref 98–111)
Creatinine, Ser: 1.23 mg/dL — ABNORMAL HIGH (ref 0.44–1.00)
GFR, Estimated: 47 mL/min — ABNORMAL LOW (ref >60)
Glucose, Bld: 85 mg/dL (ref 70–99)
Potassium: 3.5 mmol/L (ref 3.5–5.1)
Sodium: 145 mmol/L (ref 135–145)

## 2022-07-21 MED ORDER — ALBUMIN HUMAN 25 % IV SOLN
25.0000 g | Freq: Four times a day (QID) | INTRAVENOUS | Status: AC
  Administered 2022-07-21 (×2): 25 g via INTRAVENOUS
  Filled 2022-07-21 (×2): qty 100

## 2022-07-21 MED ORDER — LIDOCAINE HCL 1 % IJ SOLN
INTRAMUSCULAR | Status: AC
  Filled 2022-07-21: qty 20

## 2022-07-21 NOTE — Progress Notes (Addendum)
PROGRESS NOTE    Valerie Rodgers  LFY:[id number] DOB: Jun 26, 1950 DOA: 07/16/2022 PCP: Pcp, No  71/F  w/ COPD, chronic systolic CHF, cognitive deficits/dementia, CKD 3a, cirrhosis, bipolar disorder, polysubstance/cocaine abuse and paroxysmal atrial fibrillation long-term care resident at [facility] SN[facility] presented with dyspnea, and abdominal edema x 3 to 4 days of worsening dyspnea, that progressed to abdominal and lower extremity edema. In ED in Resp distress, placed on BIPAP, creat 1.8, AST 93 ALT 54, BNP >4,500, troponin 152 and 148 , Toxicology screen negative (was positive for cocaine on 03/2022).  CXR with cardiomegaly, bilateral hilar vascular congestion and bilateral diffuse interstitial infiltrates.   1/30 volume status improving with diuresis, noted to have cognitive deficits   Subjective: Very poor historian, yesterday noted to have left breast swelling and slightly more abdominal distention   Assessment and Plan:  Acute on chronic systolic CHF 85/2778 echo with EF 20 to 25%, global hypokinesis. Interventricular septum is flattened in systole and diastole, RV preserved. RVSP 43.9. moderate dilatation of LA and RA. Moderate TR. albumin is 3.3 -Diuresing on IV Lasix, urine output inaccurate, 7 at least 7.7 L negative, weight down 18lbs -Still significantly volume overloaded, continue current dose of IV Lasix with midodrine -Paracentesis with IV albumin today -GDMT limited by CKD and hypotension -Has considerable cognitive deficits, unable to contact family, left message for granddaughter, she needs goals of care discussions  Liver cirrhosis, ascites -Diuretics as noted above, paracentesis with albumin today, monitor blood pressure  Acute on chronic hypoxemic respiratory failure due to acute cardiogenic pulmonary edema. -wean O2  Troponin elevation due to heart failure exacerbation, no clinical signs of acute coronary syndrome.   ?  Dementia, cognitive deficits and  debility Patient with advanced heart failure and very poor physical functional capacity.  Poor prognosis.   Atrial flutter, paroxysmal ([place]) Currently patient is sinus rhythm.  Per chart review patient has declined anticoagulation -Unclear of this diagnosis, not seen in cards notes  AKI-on CKD 3a Continue diuresis with furosemide Avoid hypotension, monitor BMET  COPD with acute exacerbation (HCC) No clinical signs of exacerbation, continue oxymetry monitoring.  Discontinued corticosteroids.   Bipolar disorder ([place]) With considerable cognitive deficits,?  Early dementia -B12 normal, TSH mildly elevated  Malnutrition of moderate degree Continue with nutritional supplements.    DVT prophylaxis: Heparin Code Status: DNR Family Communication: None present, granddaughter  listed on facesheet, left message Disposition Plan: Back to [facility] for long-term care likely 2 to 3 days  Consultants:    Procedures:   Antimicrobials:    Objective: Vitals:   07/21/22 0401 07/21/22 0757 07/21/22 0951 07/21/22 1207  BP: 102/71  109/68 104/61  Pulse: 89  88   Resp: 15  20   Temp: 98.3 F (36.8 C)  98.7 F (37.1 C)   TempSrc: Oral  Oral   SpO2: 100% 100% 95%   Weight: 69.7 kg     Height:        Intake/Output Summary (Last 24 hours) at 07/21/2022 1224 Last data filed at 07/21/2022 0408 Gross per 24 hour  Intake --  Output 1500 ml  Net -1500 ml   Filed Weights   07/19/22 0356 07/20/22 0353 07/21/22 0401  Weight: 79 kg 76.1 kg 69.7 kg    Examination:  Chronically ill female laying in bed, awake alert oriented to self and place only, moderate cognitive deficits CVS: S1-S2, regular rhythm Left breast with swelling Lungs: Decreased breath sounds at the bases,'s rare scattered rhonchi Abdomen: Mildly distended, nontender,  fluid thrill present Extremities: Predominantly edema in the thighs  Data Reviewed:   CBC: Recent Labs  Lab 07/16/22 1251 07/16/22 1410  07/19/22 0102  WBC 5.6  --  8.9  NEUTROABS 4.1  --   --   HGB 13.1 14.3 11.0*  HCT 40.6 42.0 36.3  MCV 85.8  --  86.6  PLT 131*  --  342*   Basic Metabolic Panel: Recent Labs  Lab 07/17/22 0600 07/18/22 0117 07/19/22 0102 07/20/22 0051 07/21/22 0133  NA 143 142 143 140 145  K 4.1 3.9 3.5 3.4* 3.5  CL 104 106 103 100 100  CO2 26 28 32 30 35*  GLUCOSE 128* 138* 95 112* 85  BUN 50* 52* 52* 51* 47*  CREATININE 1.79* 1.65* 1.62* 1.49* 1.23*  CALCIUM 9.0 8.7* 8.9 9.0 9.1  MG  --  2.3  --  1.9  --    GFR: Estimated Creatinine Clearance: 39.6 mL/min (A) (by C-G formula based on SCr of 1.23 mg/dL (H)). Liver Function Tests: Recent Labs  Lab 07/16/22 1251 07/17/22 0600 07/19/22 0102  AST 93* 76* 45*  ALT 54* 54* 46*  ALKPHOS 188* 196* 176*  BILITOT 1.4* 1.4* 1.6*  PROT 6.9 7.1 6.4*  ALBUMIN 3.6 3.5 3.3*   Recent Labs  Lab 07/16/22 1251  LIPASE 37   Recent Labs  Lab 07/16/22 1530 07/19/22 0102  AMMONIA 23 22   Coagulation Profile: Recent Labs  Lab 07/16/22 1251  INR 1.4*   Cardiac Enzymes: No results for input(s): "CKTOTAL", "CKMB", "CKMBINDEX", "TROPONINI" in the last 168 hours. BNP (last 3 results) No results for input(s): "PROBNP" in the last 8760 hours. HbA1C: No results for input(s): "HGBA1C" in the last 72 hours. CBG: No results for input(s): "GLUCAP" in the last 168 hours. Lipid Profile: No results for input(s): "CHOL", "HDL", "LDLCALC", "TRIG", "CHOLHDL", "LDLDIRECT" in the last 72 hours. Thyroid Function Tests: Recent Labs    07/19/22 0101  TSH 4.856*   Anemia Panel: No results for input(s): "VITAMINB12", "FOLATE", "FERRITIN", "TIBC", "IRON", "RETICCTPCT" in the last 72 hours. Urine analysis:    Component Value Date/Time   COLORURINE YELLOW 07/16/2022 1253   APPEARANCEUR HAZY (A) 07/16/2022 1253   LABSPEC 1.006 07/16/2022 1253   PHURINE 5.0 07/16/2022 1253   GLUCOSEU NEGATIVE 07/16/2022 1253   HGBUR MODERATE (A) 07/16/2022 1253    BILIRUBINUR NEGATIVE 07/16/2022 1253   KETONESUR NEGATIVE 07/16/2022 1253   PROTEINUR NEGATIVE 07/16/2022 1253   NITRITE NEGATIVE 07/16/2022 1253   LEUKOCYTESUR LARGE (A) 07/16/2022 1253   Sepsis Labs: @LABRCNTIP (procalcitonin:4,lacticidven:4)  ) Recent Results (from the past 240 hour(s))  Resp panel by RT-PCR (RSV, Flu A&B, Covid) Anterior Nasal Swab     Status: None   Collection Time: 07/16/22  1:08 PM   Specimen: Anterior Nasal Swab  Result Value Ref Range Status   SARS Coronavirus 2 by RT PCR NEGATIVE NEGATIVE Final    Comment: (NOTE) SARS-CoV-2 target nucleic acids are NOT DETECTED.  The SARS-CoV-2 RNA is generally detectable in upper respiratory specimens during the acute phase of infection. The lowest concentration of SARS-CoV-2 viral copies this assay can detect is 138 copies/mL. A negative result does not preclude SARS-Cov-2 infection and should not be used as the sole basis for treatment or other patient management decisions. A negative result may occur with  improper specimen collection/handling, submission of specimen other than nasopharyngeal swab, presence of viral mutation(s) within the areas targeted by this assay, and inadequate number of viral copies(<138 copies/mL). A  negative result must be combined with clinical observations, patient history, and epidemiological information. The expected result is Negative.  Fact Sheet for Patients:  EntrepreneurPulse.com.au  Fact Sheet for Healthcare Providers:  IncredibleEmployment.be  This test is no t yet approved or cleared by the [place] FDA and  has been authorized for detection and/or diagnosis of SARS-CoV-2 by FDA under an Emergency Use Authorization (EUA). This EUA will remain  in effect (meaning this test can be used) for the duration of the COVID-19 declaration under Section 564(b)(1) of the Act, 21 U.S.C.section 360bbb-3(b)(1), unless the authorization is terminated   or revoked sooner.       Influenza A by PCR NEGATIVE NEGATIVE Final   Influenza B by PCR NEGATIVE NEGATIVE Final    Comment: (NOTE) The Xpert Xpress SARS-CoV-2/FLU/RSV plus assay is intended as an aid in the diagnosis of influenza from Nasopharyngeal swab specimens and should not be used as a sole basis for treatment. Nasal washings and aspirates are unacceptable for Xpert Xpress SARS-CoV-2/FLU/RSV testing.  Fact Sheet for Patients: EntrepreneurPulse.com.au  Fact Sheet for Healthcare Providers: IncredibleEmployment.be  This test is not yet approved or cleared by the [place] FDA and has been authorized for detection and/or diagnosis of SARS-CoV-2 by FDA under an Emergency Use Authorization (EUA). This EUA will remain in effect (meaning this test can be used) for the duration of the COVID-19 declaration under Section 564(b)(1) of the Act, 21 U.S.C. section 360bbb-3(b)(1), unless the authorization is terminated or revoked.     Resp Syncytial Virus by PCR NEGATIVE NEGATIVE Final    Comment: (NOTE) Fact Sheet for Patients: EntrepreneurPulse.com.au  Fact Sheet for Healthcare Providers: IncredibleEmployment.be  This test is not yet approved or cleared by the [place] FDA and has been authorized for detection and/or diagnosis of SARS-CoV-2 by FDA under an Emergency Use Authorization (EUA). This EUA will remain in effect (meaning this test can be used) for the duration of the COVID-19 declaration under Section 564(b)(1) of the Act, 21 U.S.C. section 360bbb-3(b)(1), unless the authorization is terminated or revoked.  Performed at [facility], [place] [address] [place], [place] [address]      Radiology Studies: No results found.   Scheduled Meds:  feeding supplement  237 mL Oral BID BM   furosemide  80 mg Intravenous Q12H   heparin  5,000 Units Subcutaneous Q12H   lidocaine        midodrine  10 mg Oral TID   mometasone-formoterol  2 puff Inhalation BID   multivitamin with minerals  1 tablet Oral Daily   sodium chloride flush  3 mL Intravenous Q12H   traZODone  50 mg Oral QPM   Continuous Infusions:  sodium chloride     albumin human 25 g (07/21/22 1018)     LOS: 5 days    Time spent: 20min    Domenic Polite, MD [facility]   07/21/2022, 12:24 PM

## 2022-07-21 NOTE — Progress Notes (Signed)
SATURATION QUALIFICATIONS: (This note is used to comply with regulatory documentation for home oxygen)  Patient Saturations on Room Air at Rest = 100%  Patient Saturations on Room Air while Ambulating = 97%

## 2022-07-21 NOTE — Procedures (Signed)
PROCEDURE SUMMARY:  Successful US guided paracentesis from left lateral abdomen.  Yielded 3.1 liters of clear, yellow fluid.  No immediate complications.  Pt tolerated well.   Specimen was not sent for labs.  EBL < 67mL  Docia Barrier PA-C 07/21/2022 5:32 PM

## 2022-07-21 NOTE — Progress Notes (Signed)
Occupational Therapy Treatment Patient Details Name: Valerie Rodgers MRN: [id number] DOB: January 18, 1951 Today's Date: 07/21/2022   History of present illness 72 y/o F presenting to ED on 1/28 from [facility] with SOB, abdominal pain, BLE edema, and weight gain. Admitted for CHF. PMH includes COPD, chronic hypoxic respiratory failure, combined chronic systolic and diastolic CHF, substance abuse, chronic wounds, A fib not on anticoagulation.   OT comments  Pt greeted supine in bed with [place] in her mouth, pt agreeable to OT intervention. Pt requires MAX A for bed mobility and MAX A for static sitting balance with pt laying self back down reporting fatigue and wanting to eat. Pt continues to be pleasantly confused, oriented to self and location. Pt most limited by impaired cognition in the areas of  Orientation, Attention, Memory, Following commands, Safety/judgement, Awareness, Problem solving. Did assist pt with self feeding from bed level with pt scooping food into her mits needing MAX cues for safety. Pt would continue to benefit from skilled occupational therapy while admitted and after d/c to address the below listed limitations in order to improve overall functional mobility and facilitate independence with BADL participation. DC plan remains appropriate, will follow acutely per POC.     Recommendations for follow up therapy are one component of a multi-disciplinary discharge planning process, led by the attending physician.  Recommendations may be updated based on patient status, additional functional criteria and insurance authorization.    Follow Up Recommendations  Skilled nursing-short term rehab (<3 hours/day)     Assistance Recommended at Discharge Frequent or constant Supervision/Assistance  Patient can return home with the following  Two people to help with walking and/or transfers;A lot of help with bathing/dressing/bathroom;Assistance with feeding;Direct supervision/assist for  medications management;Direct supervision/assist for financial management;Assist for transportation;Help with stairs or ramp for entrance   Equipment Recommendations  Other (comment) (defer)    Recommendations for Other Services      Precautions / Restrictions Precautions Precautions: Fall Precaution Comments: watch O2 Restrictions Weight Bearing Restrictions: No       Mobility Bed Mobility Overal bed mobility: Needs Assistance Bed Mobility: Sit to Supine, Supine to Sit     Supine to sit: Max assist, HOB elevated Sit to supine: Max assist, HOB elevated   General bed mobility comments: pt initated bringing BLEs to EOB but requires MAX A to elevate trunk, pt returning self to supine lowering trunk but then asking for BLEs to be elevated back to bed    Transfers                   General transfer comment: pt laid self back down before able to initiate standing     Balance Overall balance assessment: Needs assistance Sitting-balance support: Feet supported, Bilateral upper extremity supported Sitting balance-Leahy Scale: Poor Sitting balance - Comments: losing balance in all directions; mostly to L side                                   ADL either performed or assessed with clinical judgement   ADL Overall ADL's : Needs assistance/impaired Eating/Feeding: Minimal assistance;Sitting;Cueing for safety Eating/Feeding Details (indicate cue type and reason): cues for safety as pt trying to eat with mitts donned, scooping food into her mits  Functional mobility during ADLs: Maximal assistance (bed mobility only) General ADL Comments: ADL participation impacted by impaired cog    Extremity/Trunk Assessment Upper Extremity Assessment Upper Extremity Assessment: Generalized weakness   Lower Extremity Assessment Lower Extremity Assessment: Defer to PT evaluation        Vision   Vision Assessment?: No  apparent visual deficits   Perception Perception Perception: Within Functional Limits   Praxis Praxis Praxis: Intact    Cognition Arousal/Alertness: Awake/alert, Lethargic (initially lethargic but aroused as session progressed) Behavior During Therapy: WFL for tasks assessed/performed Overall Cognitive Status: Impaired/Different from baseline Area of Impairment: Orientation, Attention, Memory, Following commands, Safety/judgement, Awareness, Problem solving                 Orientation Level: Disoriented to, Situation Current Attention Level: Selective Memory: Decreased short-term memory Following Commands: Follows one step commands with increased time, Follows one step commands inconsistently Safety/Judgement: Decreased awareness of safety, Decreased awareness of deficits Awareness: Intellectual Problem Solving: Slow processing, Decreased initiation, Difficulty sequencing, Requires verbal cues General Comments: pt oriented to self and location, disoriented to situtation and why she was in the hopsital, pt looking outside at sunny day stating "looks like snow out there." pt with poor awareness spilling food everywhere and scooping food into her hand mits, pt also greeted with [place] in her mouth        Exercises      Shoulder Instructions       General Comments BP 111/81 ( 93) from bed level on 4.5L with Spo2 97%,  HR increase to 100 bpm with activity    Pertinent Vitals/ Pain       Pain Assessment Pain Assessment: No/denies pain  Home Living                                          Prior Functioning/Environment              Frequency  Min 2X/week        Progress Toward Goals  OT Goals(current goals can now be found in the care plan section)  Progress towards OT goals: Progressing toward goals (incrementally)  Acute Rehab OT Goals Patient Stated Goal: none stated Time For Goal Achievement: 07/31/22 Potential to Achieve Goals: [place] Discharge plan remains appropriate;Frequency remains appropriate    Co-evaluation                 AM-PAC OT "6 Clicks" Daily Activity     Outcome Measure   Help from another person eating meals?: A Little Help from another person taking care of personal grooming?: A Little Help from another person toileting, which includes using toliet, bedpan, or urinal?: A Lot Help from another person bathing (including washing, rinsing, drying)?: A Lot Help from another person to put on and taking off regular upper body clothing?: A Lot Help from another person to put on and taking off regular lower body clothing?: A Lot 6 Click Score: 14    End of Session Equipment Utilized During Treatment: Oxygen  OT Visit Diagnosis: Unsteadiness on feet (R26.81);Other abnormalities of gait and mobility (R26.89);Muscle weakness (generalized) (M62.81)   Activity Tolerance Other (comment);Patient limited by fatigue (self limiting)   Patient Left in bed;with call bell/phone within reach;with bed alarm set   Nurse Communication Mobility status;Other (comment) (eating lunch)        Time: 1334-1401 OT  Time Calculation (min): 27 min  Charges: OT General Charges $OT Visit: 1 Visit OT Treatments $Self Care/Home Management : 23-37 mins  Harley Alto., COTA/L Acute Rehabilitation Services [phone]   Precious Haws 07/21/2022, 2:59 PM

## 2022-07-22 ENCOUNTER — Other Ambulatory Visit: Payer: Self-pay

## 2022-07-22 DIAGNOSIS — I5043 Acute on chronic combined systolic (congestive) and diastolic (congestive) heart failure: Secondary | ICD-10-CM | POA: Diagnosis not present

## 2022-07-22 LAB — COMPREHENSIVE METABOLIC PANEL
ALT: 29 U/L (ref 0–44)
AST: 34 U/L (ref 15–41)
Albumin: 3.5 g/dL (ref 3.5–5.0)
Alkaline Phosphatase: 120 U/L (ref 38–126)
Anion gap: 12 (ref 5–15)
BUN: 45 mg/dL — ABNORMAL HIGH (ref 8–23)
CO2: 37 mmol/L — ABNORMAL HIGH (ref 22–32)
Calcium: 9.1 mg/dL (ref 8.9–10.3)
Chloride: 93 mmol/L — ABNORMAL LOW (ref 98–111)
Creatinine, Ser: 1.37 mg/dL — ABNORMAL HIGH (ref 0.44–1.00)
GFR, Estimated: 41 mL/min — ABNORMAL LOW (ref >60)
Glucose, Bld: 126 mg/dL — ABNORMAL HIGH (ref 70–99)
Potassium: 3.4 mmol/L — ABNORMAL LOW (ref 3.5–5.1)
Sodium: 142 mmol/L (ref 135–145)
Total Bilirubin: 1.5 mg/dL — ABNORMAL HIGH (ref 0.3–1.2)
Total Protein: 5.8 g/dL — ABNORMAL LOW (ref 6.5–8.1)

## 2022-07-22 MED ORDER — FUROSEMIDE 10 MG/ML IJ SOLN
40.0000 mg | Freq: Two times a day (BID) | INTRAMUSCULAR | Status: DC
  Administered 2022-07-22 – 2022-07-24 (×3): 40 mg via INTRAVENOUS
  Filled 2022-07-22 (×4): qty 4

## 2022-07-22 NOTE — Progress Notes (Signed)
PROGRESS NOTE    LAURITA PERON  PYK:[id number] DOB: [birth year] DOA: 07/16/2022 PCP: Pcp, No  71/F  w/ COPD, chronic systolic CHF, cognitive deficits/dementia, CKD 3a, cirrhosis, bipolar disorder, polysubstance/cocaine abuse and paroxysmal atrial fibrillation long-term care resident at [facility] presented with dyspnea, and abdominal edema x 3 to 4 days of worsening dyspnea, that progressed to abdominal and lower extremity edema. In ED in Resp distress, placed on BIPAP, creat 1.8, AST 93 ALT 54, BNP >4,500, troponin 152 and 148 , Toxicology screen negative (was positive for cocaine on 03/2022).  CXR with cardiomegaly, bilateral hilar vascular congestion and bilateral diffuse interstitial infiltrates.   1/30 volume status improving with diuresis, noted to have cognitive deficits 2/2, underwent paracentesis, 3.1 L of clear yellow fluid drained  Subjective: Feels fair, sitting up in bed eating breakfast, denies any dyspnea   Assessment and Plan:  Acute on chronic systolic CHF 53/9767 echo with EF 20 to 25%, global hypokinesis. Interventricular septum is flattened in systole and diastole, RV preserved. RVSP 43.9. moderate dilatation of LA and RA. Moderate TR. albumin is 3.3 -Diuresed with IV Lasix, also underwent paracentesis 2/2, she is 9.5 L negative, urine output has been inaccurate, weight down 30lbs -GDMT limited by CKD and hypotension, cut down Lasix dose today, continue midodrine -Has considerable cognitive deficits, unable to contact family, left message for granddaughter, she needs goals of care discussions  Liver cirrhosis, ascites -Diuretics as noted above, underwent paracentesis 2/2, albumin given  Acute on chronic hypoxemic respiratory failure due to acute cardiogenic pulmonary edema. -wean O2  Troponin elevation due to heart failure exacerbation, no clinical signs of acute coronary syndrome.   ?  Dementia, cognitive deficits and debility Patient with advanced heart  failure and very poor physical functional capacity.  Poor prognosis.   Atrial flutter, paroxysmal ([place]) Currently patient is sinus rhythm.  Per chart review patient has declined anticoagulation -Unclear of this diagnosis, not seen in cards notes  AKI-on CKD 3a Continue diuresis with furosemide Avoid hypotension, stable  COPD with acute exacerbation (HCC) No clinical signs of exacerbation, continue oxymetry monitoring.  Discontinued corticosteroids.   Bipolar disorder ([place]) With considerable cognitive deficits,?  Early dementia -B12 normal, TSH mildly elevated  Malnutrition of moderate degree Continue with nutritional supplements.    DVT prophylaxis: Heparin Code Status: DNR Family Communication: None present, granddaughter  listed on facesheet, left message Disposition Plan: Back to [facility] for long-term care likely Monday  Consultants:    Procedures:   Antimicrobials:    Objective: Vitals:   07/22/22 0327 07/22/22 0400 07/22/22 0500 07/22/22 0600  BP: 94/74     Pulse: 92   83  Resp: 19 20 18  (!) 22  Temp: 97.7 F (36.5 C)     TempSrc: Oral     SpO2: 90%   (!) 82%  Weight: 65.3 kg     Height:        Intake/Output Summary (Last 24 hours) at 07/22/2022 1150 Last data filed at 07/22/2022 0900 Gross per 24 hour  Intake 727.7 ml  Output 2750 ml  Net -2022.3 ml   Filed Weights   07/20/22 0353 07/21/22 0401 07/22/22 0327  Weight: 76.1 kg 69.7 kg 65.3 kg    Examination:  Chronically ill female laying in bed, awake alert oriented to self and place only, moderate cognitive deficits CVS: S1-S2, regular rhythm Lungs decreased breath sounds at the bases Left breast with less swelling Abdomen less distended, nontender, fluid thrill present Extremities: 1+ edema in left  thigh  Data Reviewed:   CBC: Recent Labs  Lab 07/16/22 1251 07/16/22 1410 07/19/22 0102  WBC 5.6  --  8.9  NEUTROABS 4.1  --   --   HGB 13.1 14.3 11.0*  HCT 40.6 42.0 36.3  MCV 85.8   --  86.6  PLT 131*  --  114*   Basic Metabolic Panel: Recent Labs  Lab 07/18/22 0117 07/19/22 0102 07/20/22 0051 07/21/22 0133 07/22/22 0033  NA 142 143 140 145 142  K 3.9 3.5 3.4* 3.5 3.4*  CL 106 103 100 100 93*  CO2 28 32 30 35* 37*  GLUCOSE 138* 95 112* 85 126*  BUN 52* 52* 51* 47* 45*  CREATININE 1.65* 1.62* 1.49* 1.23* 1.37*  CALCIUM 8.7* 8.9 9.0 9.1 9.1  MG 2.3  --  1.9  --   --    GFR: Estimated Creatinine Clearance: 32.1 mL/min (A) (by C-G formula based on SCr of 1.37 mg/dL (H)). Liver Function Tests: Recent Labs  Lab 07/16/22 1251 07/17/22 0600 07/19/22 0102 07/22/22 0033  AST 93* 76* 45* 34  ALT 54* 54* 46* 29  ALKPHOS 188* 196* 176* 120  BILITOT 1.4* 1.4* 1.6* 1.5*  PROT 6.9 7.1 6.4* 5.8*  ALBUMIN 3.6 3.5 3.3* 3.5   Recent Labs  Lab 07/16/22 1251  LIPASE 37   Recent Labs  Lab 07/16/22 1530 07/19/22 0102  AMMONIA 23 22   Coagulation Profile: Recent Labs  Lab 07/16/22 1251  INR 1.4*   Cardiac Enzymes: No results for input(s): "CKTOTAL", "CKMB", "CKMBINDEX", "TROPONINI" in the last 168 hours. BNP (last 3 results) No results for input(s): "PROBNP" in the last 8760 hours. HbA1C: No results for input(s): "HGBA1C" in the last 72 hours. CBG: No results for input(s): "GLUCAP" in the last 168 hours. Lipid Profile: No results for input(s): "CHOL", "HDL", "LDLCALC", "TRIG", "CHOLHDL", "LDLDIRECT" in the last 72 hours. Thyroid Function Tests: No results for input(s): "TSH", "T4TOTAL", "FREET4", "T3FREE", "THYROIDAB" in the last 72 hours.  Anemia Panel: No results for input(s): "VITAMINB12", "FOLATE", "FERRITIN", "TIBC", "IRON", "RETICCTPCT" in the last 72 hours. Urine analysis:    Component Value Date/Time   COLORURINE YELLOW 07/16/2022 1253   APPEARANCEUR HAZY (A) 07/16/2022 1253   LABSPEC 1.006 07/16/2022 1253   PHURINE 5.0 07/16/2022 1253   GLUCOSEU NEGATIVE 07/16/2022 1253   HGBUR MODERATE (A) 07/16/2022 1253   BILIRUBINUR NEGATIVE  07/16/2022 1253   KETONESUR NEGATIVE 07/16/2022 1253   PROTEINUR NEGATIVE 07/16/2022 1253   NITRITE NEGATIVE 07/16/2022 1253   LEUKOCYTESUR LARGE (A) 07/16/2022 1253   Sepsis Labs: @LABRCNTIP (procalcitonin:4,lacticidven:4)  ) Recent Results (from the past 240 hour(s))  Resp panel by RT-PCR (RSV, Flu A&B, Covid) Anterior Nasal Swab     Status: None   Collection Time: 07/16/22  1:08 PM   Specimen: Anterior Nasal Swab  Result Value Ref Range Status   SARS Coronavirus 2 by RT PCR NEGATIVE NEGATIVE Final    Comment: (NOTE) SARS-CoV-2 target nucleic acids are NOT DETECTED.  The SARS-CoV-2 RNA is generally detectable in upper respiratory specimens during the acute phase of infection. The lowest concentration of SARS-CoV-2 viral copies this assay can detect is 138 copies/mL. A negative result does not preclude SARS-Cov-2 infection and should not be used as the sole basis for treatment or other patient management decisions. A negative result may occur with  improper specimen collection/handling, submission of specimen other than nasopharyngeal swab, presence of viral mutation(s) within the areas targeted by this assay, and inadequate number of viral  copies(<138 copies/mL). A negative result must be combined with clinical observations, patient history, and epidemiological information. The expected result is Negative.  Fact Sheet for Patients:  EntrepreneurPulse.com.au  Fact Sheet for Healthcare Providers:  IncredibleEmployment.be  This test is no t yet approved or cleared by the [place] FDA and  has been authorized for detection and/or diagnosis of SARS-CoV-2 by FDA under an Emergency Use Authorization (EUA). This EUA will remain  in effect (meaning this test can be used) for the duration of the COVID-19 declaration under Section 564(b)(1) of the Act, 21 U.S.C.section 360bbb-3(b)(1), unless the authorization is terminated  or revoked sooner.        Influenza A by PCR NEGATIVE NEGATIVE Final   Influenza B by PCR NEGATIVE NEGATIVE Final    Comment: (NOTE) The Xpert Xpress SARS-CoV-2/FLU/RSV plus assay is intended as an aid in the diagnosis of influenza from Nasopharyngeal swab specimens and should not be used as a sole basis for treatment. Nasal washings and aspirates are unacceptable for Xpert Xpress SARS-CoV-2/FLU/RSV testing.  Fact Sheet for Patients: EntrepreneurPulse.com.au  Fact Sheet for Healthcare Providers: IncredibleEmployment.be  This test is not yet approved or cleared by the [place] FDA and has been authorized for detection and/or diagnosis of SARS-CoV-2 by FDA under an Emergency Use Authorization (EUA). This EUA will remain in effect (meaning this test can be used) for the duration of the COVID-19 declaration under Section 564(b)(1) of the Act, 21 U.S.C. section 360bbb-3(b)(1), unless the authorization is terminated or revoked.     Resp Syncytial Virus by PCR NEGATIVE NEGATIVE Final    Comment: (NOTE) Fact Sheet for Patients: EntrepreneurPulse.com.au  Fact Sheet for Healthcare Providers: IncredibleEmployment.be  This test is not yet approved or cleared by the [place] FDA and has been authorized for detection and/or diagnosis of SARS-CoV-2 by FDA under an Emergency Use Authorization (EUA). This EUA will remain in effect (meaning this test can be used) for the duration of the COVID-19 declaration under Section 564(b)(1) of the Act, 21 U.S.C. section 360bbb-3(b)(1), unless the authorization is terminated or revoked.  Performed at [facility], [place] [address] [place], [place] [address]      Radiology Studies: IR Paracentesis  Result Date: 07/21/2022 INDICATION: Patient with abdominal distention, ascites. Request is made for therapeutic paracentesis. EXAM: ULTRASOUND GUIDED THERAPEUTIC PARACENTESIS  MEDICATIONS: 10 mL 1% lidocaine COMPLICATIONS: None immediate. PROCEDURE: Informed written consent was obtained from the patient after a discussion of the risks, benefits and alternatives to treatment. A timeout was performed prior to the initiation of the procedure. Initial ultrasound scanning demonstrates a moderate amount of ascites within the left lateral abdomen. The left lateral abdomen was prepped and draped in the usual sterile fashion. 1% lidocaine was used for local anesthesia. Following this, a 19 gauge, 7-cm, Yueh catheter was introduced. An ultrasound image was saved for documentation purposes. The paracentesis was performed. The catheter was removed and a dressing was applied. The patient tolerated the procedure well without immediate post procedural complication. FINDINGS: A total of approximately 3.1 liters of clear, yellow fluid was removed. Samples were sent to the laboratory as requested by the clinical team. IMPRESSION: Successful ultrasound-guided paracentesis yielding 3.1 liters of peritoneal fluid. Read by: Brynda Greathouse PA-C PLAN: If the patient eventually requires >/=2 paracenteses in a 30 day period, candidacy for formal evaluation by the [facility] will be assessed. Electronically Signed   By: Aletta Edouard M.D.   On: 07/21/2022 17:33  Scheduled Meds:  feeding supplement  237 mL Oral BID BM   furosemide  40 mg Intravenous BID   heparin  5,000 Units Subcutaneous Q12H   midodrine  10 mg Oral TID   mometasone-formoterol  2 puff Inhalation BID   multivitamin with minerals  1 tablet Oral Daily   sodium chloride flush  3 mL Intravenous Q12H   traZODone  50 mg Oral QPM   Continuous Infusions:  sodium chloride       LOS: 6 days    Time spent: 24min    Domenic Polite, MD [facility]   07/22/2022, 11:50 AM

## 2022-07-23 DIAGNOSIS — I5043 Acute on chronic combined systolic (congestive) and diastolic (congestive) heart failure: Secondary | ICD-10-CM | POA: Diagnosis not present

## 2022-07-23 LAB — BASIC METABOLIC PANEL
Anion gap: 11 (ref 5–15)
BUN: 41 mg/dL — ABNORMAL HIGH (ref 8–23)
CO2: 39 mmol/L — ABNORMAL HIGH (ref 22–32)
Calcium: 8.6 mg/dL — ABNORMAL LOW (ref 8.9–10.3)
Chloride: 89 mmol/L — ABNORMAL LOW (ref 98–111)
Creatinine, Ser: 1.22 mg/dL — ABNORMAL HIGH (ref 0.44–1.00)
GFR, Estimated: 47 mL/min — ABNORMAL LOW (ref >60)
Glucose, Bld: 102 mg/dL — ABNORMAL HIGH (ref 70–99)
Potassium: 2.9 mmol/L — ABNORMAL LOW (ref 3.5–5.1)
Sodium: 139 mmol/L (ref 135–145)

## 2022-07-23 MED ORDER — POTASSIUM CHLORIDE CRYS ER 20 MEQ PO TBCR
40.0000 meq | EXTENDED_RELEASE_TABLET | Freq: Three times a day (TID) | ORAL | Status: AC
  Administered 2022-07-23 (×3): 40 meq via ORAL
  Filled 2022-07-23 (×3): qty 2

## 2022-07-23 NOTE — Progress Notes (Signed)
PROGRESS NOTE    Valerie Rodgers  BTD:[id number] DOB: 08/13/1950 DOA: 07/16/2022 PCP: Pcp, No  71/F  w/ COPD, chronic systolic CHF, cognitive deficits/dementia, CKD 3a, cirrhosis, bipolar disorder, polysubstance/cocaine abuse and paroxysmal atrial fibrillation long-term care resident at [facility] SN[facility] presented with dyspnea, and abdominal edema x 3 to 4 days of worsening dyspnea, that progressed to abdominal and lower extremity edema. In ED in Resp distress, placed on BIPAP, creat 1.8, AST 93 ALT 54, BNP >4,500, troponin 152 and 148 , Toxicology screen negative (was positive for cocaine on 03/2022).  CXR with cardiomegaly, bilateral hilar vascular congestion and bilateral diffuse interstitial infiltrates.   1/30 volume status improving with diuresis, noted to have cognitive deficits 2/2, underwent paracentesis, 3.1 L of clear yellow fluid drained  Subjective: Feels okay overall, breathing better   Assessment and Plan:  Acute on chronic systolic CHF 03/2022 echo with EF 20 to 25%, global hypokinesis. Interventricular septum is flattened in systole and diastole, RV preserved. RVSP 43.9. moderate dilatation of LA and RA. Moderate TR. albumin is 3.3 -Diuresed with IV Lasix, also underwent paracentesis 2/2, she is 12.5 L negative, urine output has been inaccurate, weight down 30lbs -GDMT limited by CKD and hypotension,  -cut down IV Lasix today, continue midodrine -Has considerable cognitive deficits, extremely limited insight, unable to contact family, left message for granddaughter yesterday and friend Fayrene Fearing today, she needs goals of care discussions  Liver cirrhosis, ascites -Diuretics as noted above, underwent paracentesis 2/2, albumin given  Acute on chronic hypoxemic respiratory failure due to acute cardiogenic pulmonary edema. -wean O2  Troponin elevation due to heart failure exacerbation, no clinical signs of acute coronary syndrome.   ?  Dementia, cognitive deficits and  debility Patient with advanced heart failure and very poor physical functional capacity.  Poor prognosis.   Atrial flutter, paroxysmal (HCC) Currently patient is sinus rhythm.  Per chart review patient has declined anticoagulation -Unclear of this diagnosis, not seen in cards notes  AKI-on CKD 3a Continue diuresis with furosemide Avoid hypotension, stable  Hypokalemia -replace  COPD with acute exacerbation (HCC) No clinical signs of exacerbation, continue oxymetry monitoring.  Discontinued corticosteroids.   Bipolar disorder (HCC) With considerable cognitive deficits,?  Early dementia -B12 normal, TSH mildly elevated  Malnutrition of moderate degree Continue with nutritional supplements.    DVT prophylaxis: Heparin Code Status: DNR Family Communication: None present, left msg for friend Fayrene Fearing this morning Disposition Plan: Back to [facility] for long-term care likely Monday/tuesday  Consultants:    Procedures:   Antimicrobials:    Objective: Vitals:   07/22/22 1230 07/22/22 1952 07/22/22 2011 07/23/22 0545  BP: 98/88 92/67  112/73  Pulse: 88 94  (!) 107  Resp: 18 19  20   Temp: 97.7 F (36.5 C) 97.7 F (36.5 C)  98.2 F (36.8 C)  TempSrc: Oral Oral  Oral  SpO2: 92% 92% 93% 98%  Weight:    64 kg  Height:        Intake/Output Summary (Last 24 hours) at 07/23/2022 1049 Last data filed at 07/23/2022 0900 Gross per 24 hour  Intake 240 ml  Output 2950 ml  Net -2710 ml   Filed Weights   07/21/22 0401 07/22/22 0327 07/23/22 0545  Weight: 69.7 kg 65.3 kg 64 kg    Examination:  Chronically ill female, laying in bed, awake alert oriented to self and place only, moderate cognitive deficits CVS: S1-S2, regular rhythm Lungs: Few rales on the left base Left breast with swelling Abdomen: Soft,  less distended, fluid thrill noted, nontender Extremities: 1+ edema, predominantly in left upper thigh which is dependent  Data Reviewed:   CBC: Recent Labs  Lab  07/16/22 1251 07/16/22 1410 07/19/22 0102  WBC 5.6  --  8.9  NEUTROABS 4.1  --   --   HGB 13.1 14.3 11.0*  HCT 40.6 42.0 36.3  MCV 85.8  --  86.6  PLT 131*  --  831*   Basic Metabolic Panel: Recent Labs  Lab 07/18/22 0117 07/19/22 0102 07/20/22 0051 07/21/22 0133 07/22/22 0033 07/23/22 0025  NA 142 143 140 145 142 139  K 3.9 3.5 3.4* 3.5 3.4* 2.9*  CL 106 103 100 100 93* 89*  CO2 28 32 30 35* 37* 39*  GLUCOSE 138* 95 112* 85 126* 102*  BUN 52* 52* 51* 47* 45* 41*  CREATININE 1.65* 1.62* 1.49* 1.23* 1.37* 1.22*  CALCIUM 8.7* 8.9 9.0 9.1 9.1 8.6*  MG 2.3  --  1.9  --   --   --    GFR: Estimated Creatinine Clearance: 36 mL/min (A) (by C-G formula based on SCr of 1.22 mg/dL (H)). Liver Function Tests: Recent Labs  Lab 07/16/22 1251 07/17/22 0600 07/19/22 0102 07/22/22 0033  AST 93* 76* 45* 34  ALT 54* 54* 46* 29  ALKPHOS 188* 196* 176* 120  BILITOT 1.4* 1.4* 1.6* 1.5*  PROT 6.9 7.1 6.4* 5.8*  ALBUMIN 3.6 3.5 3.3* 3.5   Recent Labs  Lab 07/16/22 1251  LIPASE 37   Recent Labs  Lab 07/16/22 1530 07/19/22 0102  AMMONIA 23 22   Coagulation Profile: Recent Labs  Lab 07/16/22 1251  INR 1.4*   Cardiac Enzymes: No results for input(s): "CKTOTAL", "CKMB", "CKMBINDEX", "TROPONINI" in the last 168 hours. BNP (last 3 results) No results for input(s): "PROBNP" in the last 8760 hours. HbA1C: No results for input(s): "HGBA1C" in the last 72 hours. CBG: No results for input(s): "GLUCAP" in the last 168 hours. Lipid Profile: No results for input(s): "CHOL", "HDL", "LDLCALC", "TRIG", "CHOLHDL", "LDLDIRECT" in the last 72 hours. Thyroid Function Tests: No results for input(s): "TSH", "T4TOTAL", "FREET4", "T3FREE", "THYROIDAB" in the last 72 hours.  Anemia Panel: No results for input(s): "VITAMINB12", "FOLATE", "FERRITIN", "TIBC", "IRON", "RETICCTPCT" in the last 72 hours. Urine analysis:    Component Value Date/Time   COLORURINE YELLOW 07/16/2022 1253    APPEARANCEUR HAZY (A) 07/16/2022 1253   LABSPEC 1.006 07/16/2022 1253   PHURINE 5.0 07/16/2022 1253   GLUCOSEU NEGATIVE 07/16/2022 1253   HGBUR MODERATE (A) 07/16/2022 1253   BILIRUBINUR NEGATIVE 07/16/2022 1253   KETONESUR NEGATIVE 07/16/2022 1253   PROTEINUR NEGATIVE 07/16/2022 1253   NITRITE NEGATIVE 07/16/2022 1253   LEUKOCYTESUR LARGE (A) 07/16/2022 1253   Sepsis Labs: @LABRCNTIP (procalcitonin:4,lacticidven:4)  ) Recent Results (from the past 240 hour(s))  Resp panel by RT-PCR (RSV, Flu A&B, Covid) Anterior Nasal Swab     Status: None   Collection Time: 07/16/22  1:08 PM   Specimen: Anterior Nasal Swab  Result Value Ref Range Status   SARS Coronavirus 2 by RT PCR NEGATIVE NEGATIVE Final    Comment: (NOTE) SARS-CoV-2 target nucleic acids are NOT DETECTED.  The SARS-CoV-2 RNA is generally detectable in upper respiratory specimens during the acute phase of infection. The lowest concentration of SARS-CoV-2 viral copies this assay can detect is 138 copies/mL. A negative result does not preclude SARS-Cov-2 infection and should not be used as the sole basis for treatment or other patient management decisions. A negative result may  occur with  improper specimen collection/handling, submission of specimen other than nasopharyngeal swab, presence of viral mutation(s) within the areas targeted by this assay, and inadequate number of viral copies(<138 copies/mL). A negative result must be combined with clinical observations, patient history, and epidemiological information. The expected result is Negative.  Fact Sheet for Patients:  EntrepreneurPulse.com.au  Fact Sheet for Healthcare Providers:  IncredibleEmployment.be  This test is no t yet approved or cleared by the [place] FDA and  has been authorized for detection and/or diagnosis of SARS-CoV-2 by FDA under an Emergency Use Authorization (EUA). This EUA will remain  in effect  (meaning this test can be used) for the duration of the COVID-19 declaration under Section 564(b)(1) of the Act, 21 U.S.C.section 360bbb-3(b)(1), unless the authorization is terminated  or revoked sooner.       Influenza A by PCR NEGATIVE NEGATIVE Final   Influenza B by PCR NEGATIVE NEGATIVE Final    Comment: (NOTE) The Xpert Xpress SARS-CoV-2/FLU/RSV plus assay is intended as an aid in the diagnosis of influenza from Nasopharyngeal swab specimens and should not be used as a sole basis for treatment. Nasal washings and aspirates are unacceptable for Xpert Xpress SARS-CoV-2/FLU/RSV testing.  Fact Sheet for Patients: EntrepreneurPulse.com.au  Fact Sheet for Healthcare Providers: IncredibleEmployment.be  This test is not yet approved or cleared by the [place] FDA and has been authorized for detection and/or diagnosis of SARS-CoV-2 by FDA under an Emergency Use Authorization (EUA). This EUA will remain in effect (meaning this test can be used) for the duration of the COVID-19 declaration under Section 564(b)(1) of the Act, 21 U.S.C. section 360bbb-3(b)(1), unless the authorization is terminated or revoked.     Resp Syncytial Virus by PCR NEGATIVE NEGATIVE Final    Comment: (NOTE) Fact Sheet for Patients: EntrepreneurPulse.com.au  Fact Sheet for Healthcare Providers: IncredibleEmployment.be  This test is not yet approved or cleared by the [place] FDA and has been authorized for detection and/or diagnosis of SARS-CoV-2 by FDA under an Emergency Use Authorization (EUA). This EUA will remain in effect (meaning this test can be used) for the duration of the COVID-19 declaration under Section 564(b)(1) of the Act, 21 U.S.C. section 360bbb-3(b)(1), unless the authorization is terminated or revoked.  Performed at [facility], [place] [address], [place], [place] [address]      Radiology  Studies: IR Paracentesis  Result Date: 07/21/2022 INDICATION: Patient with abdominal distention, ascites. Request is made for therapeutic paracentesis. EXAM: ULTRASOUND GUIDED THERAPEUTIC PARACENTESIS MEDICATIONS: 10 mL 1% lidocaine COMPLICATIONS: None immediate. PROCEDURE: Informed written consent was obtained from the patient after a discussion of the risks, benefits and alternatives to treatment. A timeout was performed prior to the initiation of the procedure. Initial ultrasound scanning demonstrates a moderate amount of ascites within the left lateral abdomen. The left lateral abdomen was prepped and draped in the usual sterile fashion. 1% lidocaine was used for local anesthesia. Following this, a 19 gauge, 7-cm, Yueh catheter was introduced. An ultrasound image was saved for documentation purposes. The paracentesis was performed. The catheter was removed and a dressing was applied. The patient tolerated the procedure well without immediate post procedural complication. FINDINGS: A total of approximately 3.1 liters of clear, yellow fluid was removed. Samples were sent to the laboratory as requested by the clinical team. IMPRESSION: Successful ultrasound-guided paracentesis yielding 3.1 liters of peritoneal fluid. Read by: Brynda Greathouse PA-C PLAN: If the patient eventually requires >/=2 paracenteses in a 30 day period, candidacy for  formal evaluation by the [facility] Interventional Radiology [facility] will be assessed. Electronically Signed   By: Aletta Edouard M.D.   On: 07/21/2022 17:33     Scheduled Meds:  feeding supplement  237 mL Oral BID BM   furosemide  40 mg Intravenous BID   heparin  5,000 Units Subcutaneous Q12H   midodrine  10 mg Oral TID   mometasone-formoterol  2 puff Inhalation BID   multivitamin with minerals  1 tablet Oral Daily   potassium chloride  40 mEq Oral TID   sodium chloride flush  3 mL Intravenous Q12H   traZODone  50 mg Oral QPM   Continuous  Infusions:  sodium chloride       LOS: 7 days    Time spent: 40min    Domenic Polite, MD [facility]   07/23/2022, 10:49 AM

## 2022-07-24 DIAGNOSIS — I5043 Acute on chronic combined systolic (congestive) and diastolic (congestive) heart failure: Secondary | ICD-10-CM | POA: Diagnosis not present

## 2022-07-24 LAB — BASIC METABOLIC PANEL
Anion gap: 11 (ref 5–15)
BUN: 34 mg/dL — ABNORMAL HIGH (ref 8–23)
CO2: 39 mmol/L — ABNORMAL HIGH (ref 22–32)
Calcium: 8.8 mg/dL — ABNORMAL LOW (ref 8.9–10.3)
Chloride: 88 mmol/L — ABNORMAL LOW (ref 98–111)
Creatinine, Ser: 1.18 mg/dL — ABNORMAL HIGH (ref 0.44–1.00)
GFR, Estimated: 49 mL/min — ABNORMAL LOW (ref >60)
Glucose, Bld: 84 mg/dL (ref 70–99)
Potassium: 3.7 mmol/L (ref 3.5–5.1)
Sodium: 138 mmol/L (ref 135–145)

## 2022-07-24 MED ORDER — POTASSIUM CHLORIDE CRYS ER 20 MEQ PO TBCR: 40.0000 meq | EXTENDED_RELEASE_TABLET | Freq: Two times a day (BID) | ORAL | Status: DC

## 2022-07-24 MED ORDER — TORSEMIDE 20 MG PO TABS: 40.0000 mg | ORAL_TABLET | Freq: Every day | ORAL | Status: DC

## 2022-07-24 MED ORDER — POTASSIUM CHLORIDE CRYS ER 20 MEQ PO TBCR
40.0000 meq | EXTENDED_RELEASE_TABLET | Freq: Once | ORAL | Status: AC
  Administered 2022-07-24: 40 meq via ORAL
  Filled 2022-07-24: qty 2

## 2022-07-24 NOTE — Consult Note (Signed)
[facility] Nurse Consult Note: Reason for Consult:Sacral stage 2 pressure injury.  Wound type:pressure Pressure Injury POA: Yes Measurement:Bedside RN to measure with next dressing change and document on Nursing Flow Sheet Wound bed:red Drainage (amount, consistency, odor) scant serosanguinous Periwound:intact, pink Dressing procedure/placement/frequency: I have provided guidance for the topical care of this skin injury based on the standing skin care order set. Turning and repositioning is in place and I have added that time in the supine position be minimized. Heels are to be floated to prevent PI in this area.Topical care will be with a daily cleanse, pat dry followed by placement of a folded layer of xeroform gauze topped with dry gauze and secured with a sacral silicone foam.  [place] team will not follow, but will remain available to this patient, the nursing and medical teams.  Please re-consult if needed.  Thank you for inviting [place] to participate in this patient's Plan of Care.  Maudie Flakes, MSN, RN, CNS, [place], Serita Grammes, [employer], [employer] phone:  [phone]

## 2022-07-24 NOTE — Progress Notes (Signed)
   07/24/22 1151  Spiritual Encounters  Type of Visit Initial  Care provided to: Patient  Referral source Nurse (RN/NT/LPN)  Reason for visit Advance directives  OnCall Visit No  Advance Directives (For Healthcare)  Does Patient Have a Medical Advance Directive? No  Would patient like information on creating a medical advance directive? No - Patient declined   Chaplain responded to an advance directive spiritual consult for the patient. The patient was not interested in discussing advance directives at time of visit.   Note prepared by Abbott Pao, [place] Resident [phone]

## 2022-07-24 NOTE — Progress Notes (Signed)
PROGRESS NOTE    TENIOLA TSENG  AOZ:[id number] DOB: 02-25-1951 DOA: 07/16/2022 PCP: Pcp, No  71/F  w/ COPD, chronic systolic CHF, cognitive deficits/dementia, CKD 3a, cirrhosis, bipolar disorder, polysubstance/cocaine abuse and paroxysmal atrial fibrillation long-term care resident at [facility] presented with dyspnea, and abdominal edema x 3 to 4 days of worsening dyspnea, that progressed to abdominal and lower extremity edema. In ED in Resp distress, placed on BIPAP, creat 1.8, AST 93 ALT 54, BNP >4,500, troponin 152 and 148 , Toxicology screen negative (was positive for cocaine on 03/2022).  CXR with cardiomegaly, bilateral hilar vascular congestion and bilateral diffuse interstitial infiltrates.   1/30 volume status improving with diuresis, noted to have cognitive deficits 2/2, underwent paracentesis, 3.1 L of clear yellow fluid drained  Subjective: Feels okay overall, breathing better   Assessment and Plan:  Acute on chronic systolic CHF 96/2952 echo with EF 20 to 25%, global hypokinesis. Interventricular septum is flattened in systole and diastole, RV preserved. RVSP 43.9. moderate dilatation of LA and RA. Moderate TR. albumin is 3.3 -Diuresed with IV Lasix, also underwent paracentesis 2/2, at least 12.8 L negative, urine output has been inaccurate, weight down 33lbs -GDMT limited by CKD and hypotension,  -Switch to oral torsemide today, continue midodrine -Spoke to her friend [name], patient is estranged from family for over 10 years, her friend [name] has been signing her papers for SNF etc. not formally her healthcare power of attorney, social work and chaplain consult for [facility] -I discussed poor prognosis with [name], and recommended palliative consult and need for hospice in the near future -Back to SNF tomorrow if stable  Liver cirrhosis, ascites -Diuretics as noted above, underwent paracentesis 2/2, albumin given  Acute on chronic hypoxemic respiratory failure due to acute  cardiogenic pulmonary edema. -wean O2  Troponin elevation due to heart failure exacerbation, no clinical signs of acute coronary syndrome.   ?  Dementia, cognitive deficits and debility Patient with advanced heart failure and very poor physical functional capacity.  Poor prognosis.   Atrial flutter, paroxysmal (HCC) -Remains in sinus rhythm.  Per chart review patient has declined anticoagulation in the past and currently  AKI-on CKD 3a Continue diuresis with furosemide Avoid hypotension, stable  Hypokalemia -replace  COPD with acute exacerbation (HCC) No clinical signs of exacerbation, continue oxymetry monitoring.  Discontinued corticosteroids.   Bipolar disorder ([place]) With considerable cognitive deficits,?  Early dementia -B12 normal, TSH mildly elevated  Malnutrition of moderate degree Continue with nutritional supplements.    DVT prophylaxis: Heparin Code Status: DNR Family Communication: None present, called and updated her friend [name] Disposition Plan: Back to [facility] for long-term care likely Tuesday  Consultants:    Procedures:   Antimicrobials:    Objective: Vitals:   07/23/22 2344 07/24/22 0110 07/24/22 0446 07/24/22 0817  BP: 94/62  (!) 89/65   Pulse: 90  87   Resp: 20  18   Temp: 98.6 F (37 C)  98.3 F (36.8 C)   TempSrc: Oral  Axillary   SpO2: 91%  94% 94%  Weight:  63 kg    Height:        Intake/Output Summary (Last 24 hours) at 07/24/2022 1013 Last data filed at 07/23/2022 1930 Gross per 24 hour  Intake 536 ml  Output 950 ml  Net -414 ml   Filed Weights   07/22/22 0327 07/23/22 0545 07/24/22 0110  Weight: 65.3 kg 64 kg 63 kg    Examination:  Chronically ill female laying in bed,  awake alert oriented to self and place, moderate cognitive deficits CVS: S1-S2, regular rhythm Lungs: Scant left basilar rales Abdomen: Soft, mildly distended with fluid thrill, nontender Extremities:'s trace to 1+ edema predominantly in left upper  thigh  Data Reviewed:   CBC: Recent Labs  Lab 07/19/22 0102  WBC 8.9  HGB 11.0*  HCT 36.3  MCV 86.6  PLT 761*   Basic Metabolic Panel: Recent Labs  Lab 07/18/22 0117 07/19/22 0102 07/20/22 0051 07/21/22 0133 07/22/22 0033 07/23/22 0025 07/24/22 0033  NA 142   < > 140 145 142 139 138  K 3.9   < > 3.4* 3.5 3.4* 2.9* 3.7  CL 106   < > 100 100 93* 89* 88*  CO2 28   < > 30 35* 37* 39* 39*  GLUCOSE 138*   < > 112* 85 126* 102* 84  BUN 52*   < > 51* 47* 45* 41* 34*  CREATININE 1.65*   < > 1.49* 1.23* 1.37* 1.22* 1.18*  CALCIUM 8.7*   < > 9.0 9.1 9.1 8.6* 8.8*  MG 2.3  --  1.9  --   --   --   --    < > = values in this interval not displayed.   GFR: Estimated Creatinine Clearance: 37.2 mL/min (A) (by C-G formula based on SCr of 1.18 mg/dL (H)). Liver Function Tests: Recent Labs  Lab 07/19/22 0102 07/22/22 0033  AST 45* 34  ALT 46* 29  ALKPHOS 176* 120  BILITOT 1.6* 1.5*  PROT 6.4* 5.8*  ALBUMIN 3.3* 3.5   No results for input(s): "LIPASE", "AMYLASE" in the last 168 hours.  Recent Labs  Lab 07/19/22 0102  AMMONIA 22   Coagulation Profile: No results for input(s): "INR", "PROTIME" in the last 168 hours.  Cardiac Enzymes: No results for input(s): "CKTOTAL", "CKMB", "CKMBINDEX", "TROPONINI" in the last 168 hours. BNP (last 3 results) No results for input(s): "PROBNP" in the last 8760 hours. HbA1C: No results for input(s): "HGBA1C" in the last 72 hours. CBG: No results for input(s): "GLUCAP" in the last 168 hours. Lipid Profile: No results for input(s): "CHOL", "HDL", "LDLCALC", "TRIG", "CHOLHDL", "LDLDIRECT" in the last 72 hours. Thyroid Function Tests: No results for input(s): "TSH", "T4TOTAL", "FREET4", "T3FREE", "THYROIDAB" in the last 72 hours.  Anemia Panel: No results for input(s): "VITAMINB12", "FOLATE", "FERRITIN", "TIBC", "IRON", "RETICCTPCT" in the last 72 hours. Urine analysis:    Component Value Date/Time   COLORURINE YELLOW 07/16/2022 1253    APPEARANCEUR HAZY (A) 07/16/2022 1253   LABSPEC 1.006 07/16/2022 1253   PHURINE 5.0 07/16/2022 1253   GLUCOSEU NEGATIVE 07/16/2022 1253   HGBUR MODERATE (A) 07/16/2022 1253   BILIRUBINUR NEGATIVE 07/16/2022 1253   KETONESUR NEGATIVE 07/16/2022 1253   PROTEINUR NEGATIVE 07/16/2022 1253   NITRITE NEGATIVE 07/16/2022 1253   LEUKOCYTESUR LARGE (A) 07/16/2022 1253   Sepsis Labs: @LABRCNTIP (procalcitonin:4,lacticidven:4)  ) Recent Results (from the past 240 hour(s))  Resp panel by RT-PCR (RSV, Flu A&B, Covid) Anterior Nasal Swab     Status: None   Collection Time: 07/16/22  1:08 PM   Specimen: Anterior Nasal Swab  Result Value Ref Range Status   SARS Coronavirus 2 by RT PCR NEGATIVE NEGATIVE Final    Comment: (NOTE) SARS-CoV-2 target nucleic acids are NOT DETECTED.  The SARS-CoV-2 RNA is generally detectable in upper respiratory specimens during the acute phase of infection. The lowest concentration of SARS-CoV-2 viral copies this assay can detect is 138 copies/mL. A negative result does not preclude  SARS-Cov-2 infection and should not be used as the sole basis for treatment or other patient management decisions. A negative result may occur with  improper specimen collection/handling, submission of specimen other than nasopharyngeal swab, presence of viral mutation(s) within the areas targeted by this assay, and inadequate number of viral copies(<138 copies/mL). A negative result must be combined with clinical observations, patient history, and epidemiological information. The expected result is Negative.  Fact Sheet for Patients:  EntrepreneurPulse.com.au  Fact Sheet for Healthcare Providers:  IncredibleEmployment.be  This test is no t yet approved or cleared by the [place] FDA and  has been authorized for detection and/or diagnosis of SARS-CoV-2 by FDA under an Emergency Use Authorization (EUA). This EUA will remain  in effect  (meaning this test can be used) for the duration of the COVID-19 declaration under Section 564(b)(1) of the Act, 21 U.S.C.section 360bbb-3(b)(1), unless the authorization is terminated  or revoked sooner.       Influenza A by PCR NEGATIVE NEGATIVE Final   Influenza B by PCR NEGATIVE NEGATIVE Final    Comment: (NOTE) The Xpert Xpress SARS-CoV-2/FLU/RSV plus assay is intended as an aid in the diagnosis of influenza from Nasopharyngeal swab specimens and should not be used as a sole basis for treatment. Nasal washings and aspirates are unacceptable for Xpert Xpress SARS-CoV-2/FLU/RSV testing.  Fact Sheet for Patients: EntrepreneurPulse.com.au  Fact Sheet for Healthcare Providers: IncredibleEmployment.be  This test is not yet approved or cleared by the [place] FDA and has been authorized for detection and/or diagnosis of SARS-CoV-2 by FDA under an Emergency Use Authorization (EUA). This EUA will remain in effect (meaning this test can be used) for the duration of the COVID-19 declaration under Section 564(b)(1) of the Act, 21 U.S.C. section 360bbb-3(b)(1), unless the authorization is terminated or revoked.     Resp Syncytial Virus by PCR NEGATIVE NEGATIVE Final    Comment: (NOTE) Fact Sheet for Patients: EntrepreneurPulse.com.au  Fact Sheet for Healthcare Providers: IncredibleEmployment.be  This test is not yet approved or cleared by the [place] FDA and has been authorized for detection and/or diagnosis of SARS-CoV-2 by FDA under an Emergency Use Authorization (EUA). This EUA will remain in effect (meaning this test can be used) for the duration of the COVID-19 declaration under Section 564(b)(1) of the Act, 21 U.S.C. section 360bbb-3(b)(1), unless the authorization is terminated or revoked.  Performed at [facility], [place] [address] [place], [place] [address]      Radiology  Studies: No results found.   Scheduled Meds:  feeding supplement  237 mL Oral BID BM   furosemide  40 mg Intravenous BID   heparin  5,000 Units Subcutaneous Q12H   midodrine  10 mg Oral TID   mometasone-formoterol  2 puff Inhalation BID   multivitamin with minerals  1 tablet Oral Daily   sodium chloride flush  3 mL Intravenous Q12H   traZODone  50 mg Oral QPM   Continuous Infusions:  sodium chloride       LOS: 8 days    Time spent: 29min    Domenic Polite, MD [facility]   07/24/2022, 10:13 AM

## 2022-07-24 NOTE — Progress Notes (Signed)
PT Cancellation Note  Patient Details Name: Valerie Rodgers MRN: [id number] DOB: 1950/12/14   Cancelled Treatment:    Reason Eval/Treat Not Completed: Medical issues which prohibited therapy.  Medically having a difficult day,    Ramond Dial 07/24/2022, 2:27 PM  Mee Hives, PT PhD Acute Rehab Dept. Number: [place] and [place]

## 2022-07-25 DIAGNOSIS — I5043 Acute on chronic combined systolic (congestive) and diastolic (congestive) heart failure: Secondary | ICD-10-CM | POA: Diagnosis not present

## 2022-07-25 DIAGNOSIS — Z66 Do not resuscitate: Secondary | ICD-10-CM

## 2022-07-25 DIAGNOSIS — J441 Chronic obstructive pulmonary disease with (acute) exacerbation: Secondary | ICD-10-CM | POA: Diagnosis not present

## 2022-07-25 DIAGNOSIS — I5023 Acute on chronic systolic (congestive) heart failure: Secondary | ICD-10-CM | POA: Diagnosis not present

## 2022-07-25 DIAGNOSIS — N1831 Chronic kidney disease, stage 3a: Secondary | ICD-10-CM | POA: Diagnosis not present

## 2022-07-25 DIAGNOSIS — Z515 Encounter for palliative care: Secondary | ICD-10-CM

## 2022-07-25 LAB — BASIC METABOLIC PANEL
Anion gap: 8 (ref 5–15)
BUN: 29 mg/dL — ABNORMAL HIGH (ref 8–23)
CO2: 39 mmol/L — ABNORMAL HIGH (ref 22–32)
Calcium: 8.2 mg/dL — ABNORMAL LOW (ref 8.9–10.3)
Chloride: 93 mmol/L — ABNORMAL LOW (ref 98–111)
Creatinine, Ser: 1.06 mg/dL — ABNORMAL HIGH (ref 0.44–1.00)
GFR, Estimated: 56 mL/min — ABNORMAL LOW (ref >60)
Glucose, Bld: 83 mg/dL (ref 70–99)
Potassium: 3.7 mmol/L (ref 3.5–5.1)
Sodium: 140 mmol/L (ref 135–145)

## 2022-07-25 MED ORDER — TORSEMIDE 20 MG PO TABS
20.0000 mg | ORAL_TABLET | Freq: Every day | ORAL | Status: DC
  Administered 2022-07-25: 20 mg via ORAL
  Filled 2022-07-25: qty 1

## 2022-07-25 NOTE — Progress Notes (Signed)
Physical Therapy Treatment Patient Details Name: Valerie Rodgers MRN: [id number] DOB: 07-12-1950 Today's Date: 07/25/2022   History of Present Illness 72 y/o F presenting to ED on 1/28 from [facility] with SOB, abdominal pain, BLE edema, and weight gain. Admitted for CHF. PMH includes COPD, chronic hypoxic respiratory failure, combined chronic systolic and diastolic CHF, substance abuse, chronic wounds, A fib not on anticoagulation.    PT Comments    Pt greeted supine in bed with and agreeable to session with focus on functional transfers and bed mobility for increased activity tolerance and safety with mobility. Pt continues to require mod-max assist +2 for all mobility however pt with improved command following and initiation this date. Pt able to maintain sitting balance at EOB ~15 mins and reach outside BOS without LOB. Pt requiring mod a +2 to come to partial stand x3 trials and squat pivot EOB<>chair.  Pt continues to be limited by impaired cognition, global weakness and fatigue. Current plan remains appropriate to address deficits and maximize functional independence and decrease caregiver burden. Pt continues to benefit from skilled PT services to progress toward functional mobility goals.    Recommendations for follow up therapy are one component of a multi-disciplinary discharge planning process, led by the attending physician.  Recommendations may be updated based on patient status, additional functional criteria and insurance authorization.  Follow Up Recommendations  Skilled nursing-short term rehab (<3 hours/day) Can patient physically be transported by private vehicle: No   Assistance Recommended at Discharge Frequent or constant Supervision/Assistance  Patient can return home with the following Two people to help with walking and/or transfers;A lot of help with bathing/dressing/bathroom;Assistance with cooking/housework;Direct supervision/assist for medications management;Direct  supervision/assist for financial management;Assist for transportation;Help with stairs or ramp for entrance   Equipment Recommendations  None recommended by PT    Recommendations for Other Services       Precautions / Restrictions Precautions Precautions: Fall Precaution Comments: watch O2 Restrictions Weight Bearing Restrictions: No     Mobility  Bed Mobility Overal bed mobility: Needs Assistance Bed Mobility: Sit to Supine, Supine to Sit     Supine to sit: Max assist, HOB elevated, +2 for physical assistance Sit to supine: Max assist, +2 for physical assistance        Transfers Overall transfer level: Needs assistance Equipment used: 2 person hand held assist Transfers: Sit to/from Stand, Bed to chair/wheelchair/BSC Sit to Stand: Max assist, +2 physical assistance Stand pivot transfers: Mod assist, +2 physical assistance         General transfer comment: unable to come to fully upright standing    Ambulation/Gait                   Stairs             Wheelchair Mobility    Modified Rankin (Stroke Patients Only)       Balance Overall balance assessment: Needs assistance Sitting-balance support: Feet supported, Bilateral upper extremity supported Sitting balance-Leahy Scale: Fair Sitting balance - Comments: sits EOB without LOB   Standing balance support: During functional activity, Reliant on assistive device for balance Standing balance-Leahy Scale: Poor                              Cognition Arousal/Alertness: Awake/alert, Lethargic (increased alertness with mobility/sitting EOB) Behavior During Therapy: WFL for tasks assessed/performed Overall Cognitive Status: Impaired/Different from baseline Area of Impairment: Orientation, Attention, Memory, Following commands, Safety/judgement,  Awareness, Problem solving                 Orientation Level: Disoriented to, Time, Situation (states it is 1994) Current Attention  Level: Selective Memory: Decreased short-term memory Following Commands: Follows one step commands with increased time, Follows one step commands inconsistently Safety/Judgement: Decreased awareness of safety, Decreased awareness of deficits Awareness: Intellectual Problem Solving: Slow processing, Decreased initiation, Difficulty sequencing, Requires verbal cues          Exercises      General Comments General comments (skin integrity, edema, etc.): VSS on supplemetnal O2      Pertinent Vitals/Pain Pain Assessment Pain Assessment: Faces Faces Pain Scale: Hurts little more Pain Location: low back Pain Descriptors / Indicators: Discomfort Pain Intervention(s): Limited activity within patient's tolerance, Monitored during session    Home Living                          Prior Function            PT Goals (current goals can now be found in the care plan section) Acute Rehab PT Goals PT Goal Formulation: With patient Time For Goal Achievement: 07/31/22 Progress towards PT goals: Progressing toward goals    Frequency           PT Plan Current plan remains appropriate    Co-evaluation PT/OT/SLP Co-Evaluation/Treatment: Yes Reason for Co-Treatment: Complexity of the patient's impairments (multi-system involvement);For patient/therapist safety;To address functional/ADL transfers PT goals addressed during session: Mobility/safety with mobility;Balance;Strengthening/ROM OT goals addressed during session: ADL's and self-care      AM-PAC PT "6 Clicks" Mobility   Outcome Measure  Help needed turning from your back to your side while in a flat bed without using bedrails?: Total Help needed moving from lying on your back to sitting on the side of a flat bed without using bedrails?: Total Help needed moving to and from a bed to a chair (including a wheelchair)?: Total Help needed standing up from a chair using your arms (e.g., wheelchair or bedside chair)?:  Total Help needed to walk in hospital room?: Total Help needed climbing 3-5 steps with a railing? : Total 6 Click Score: 6    End of Session Equipment Utilized During Treatment: Gait belt;Oxygen Activity Tolerance: Patient tolerated treatment well Patient left: in bed;with call bell/phone within reach;with bed alarm set Nurse Communication: Mobility status PT Visit Diagnosis: Muscle weakness (generalized) (M62.81);Other abnormalities of gait and mobility (R26.89)     Time: 6629-4765 PT Time Calculation (min) (ACUTE ONLY): 24 min  Charges:  $Therapeutic Activity: 8-22 mins                     Eiliana Drone R. PTA Acute Rehabilitation Services Office: [place] 07/25/2022, 3:24 PM

## 2022-07-25 NOTE — Plan of Care (Signed)

## 2022-07-25 NOTE — Plan of Care (Signed)

## 2022-07-25 NOTE — Progress Notes (Signed)
Occupational Therapy Treatment Patient Details Name: Valerie Rodgers MRN: [id number] DOB: 1951-04-11 Today's Date: 07/25/2022   History of present illness 72 y/o F presenting to ED on 1/28 from [facility] with SOB, abdominal pain, BLE edema, and weight gain. Admitted for CHF. PMH includes COPD, chronic hypoxic respiratory failure, combined chronic systolic and diastolic CHF, substance abuse, chronic wounds, A fib not on anticoagulation.   OT comments  Pt progressing towards goals this session, increased motivation needed to participate, pt able to complete seated grooming task and UB ADL with set up -mod A. Pt needing max A +2 for bed mobility, and mod A +2 for squat pivot transfer from bed  <> chair. Pt able to participate in 3-4 stands with mod +2 for pericare and pad change. Pt presenting with impairments listed below, will follow acutely. Continue to recommend SNF at d/c.   Recommendations for follow up therapy are one component of a multi-disciplinary discharge planning process, led by the attending physician.  Recommendations may be updated based on patient status, additional functional criteria and insurance authorization.    Follow Up Recommendations  Skilled nursing-short term rehab (<3 hours/day)     Assistance Recommended at Discharge Frequent or constant Supervision/Assistance  Patient can return home with the following  Two people to help with walking and/or transfers;A lot of help with bathing/dressing/bathroom;Assistance with feeding;Direct supervision/assist for medications management;Direct supervision/assist for financial management;Assist for transportation;Help with stairs or ramp for entrance   Equipment Recommendations  Other (comment) (defer)    Recommendations for Other Services PT consult    Precautions / Restrictions Precautions Precautions: Fall Precaution Comments: watch O2 Restrictions Weight Bearing Restrictions: No       Mobility Bed  Mobility Overal bed mobility: Needs Assistance Bed Mobility: Sit to Supine, Supine to Sit     Supine to sit: Max assist, HOB elevated, +2 for physical assistance Sit to supine: Max assist, +2 for physical assistance        Transfers Overall transfer level: Needs assistance Equipment used: 2 person hand held assist Transfers: Sit to/from Stand, Bed to chair/wheelchair/BSC Sit to Stand: Max assist, +2 physical assistance Stand pivot transfers: Mod assist, +2 physical assistance         General transfer comment: unable to come to fully upright standing     Balance Overall balance assessment: Needs assistance Sitting-balance support: Feet supported, Bilateral upper extremity supported Sitting balance-Leahy Scale: Fair Sitting balance - Comments: sits EOB without LOB   Standing balance support: During functional activity, Reliant on assistive device for balance Standing balance-Leahy Scale: Poor                             ADL either performed or assessed with clinical judgement   ADL Overall ADL's : Needs assistance/impaired     Grooming: Set up;Wash/dry face;Sitting Grooming Details (indicate cue type and reason): washes face sitting EOB         Upper Body Dressing : Moderate assistance;Sitting Upper Body Dressing Details (indicate cue type and reason): donning clean gown sitting EOB     Toilet Transfer: Moderate assistance;+2 for physical assistance Toilet Transfer Details (indicate cue type and reason): with use of bed pad, simulated to chair Toileting- Clothing Manipulation and Hygiene: Total assistance Toileting - Clothing Manipulation Details (indicate cue type and reason): pericare in standing     Functional mobility during ADLs: Maximal assistance;+2 for safety/equipment      Extremity/Trunk Assessment Upper Extremity  Assessment Upper Extremity Assessment: Generalized weakness   Lower Extremity Assessment Lower Extremity Assessment: Defer  to PT evaluation        Vision   Vision Assessment?: No apparent visual deficits   Perception Perception Perception: Not tested   Praxis Praxis Praxis: Not tested    Cognition Arousal/Alertness: Awake/alert, Lethargic (increased alertness with mobility/sitting EOB) Behavior During Therapy: WFL for tasks assessed/performed Overall Cognitive Status: Impaired/Different from baseline Area of Impairment: Orientation, Attention, Memory, Following commands, Safety/judgement, Awareness, Problem solving                 Orientation Level: Disoriented to, Time, Situation (states it is 1994) Current Attention Level: Selective Memory: Decreased short-term memory Following Commands: Follows one step commands with increased time, Follows one step commands inconsistently Safety/Judgement: Decreased awareness of safety, Decreased awareness of deficits Awareness: Intellectual Problem Solving: Slow processing, Decreased initiation, Difficulty sequencing, Requires verbal cues          Exercises      Shoulder Instructions       General Comments VSS on supplemental O2    Pertinent Vitals/ Pain       Pain Assessment Pain Assessment: Faces Pain Score: 4  Faces Pain Scale: Hurts little more Pain Location: low back Pain Descriptors / Indicators: Discomfort Pain Intervention(s): Limited activity within patient's tolerance, Monitored during session, Repositioned  Home Living                                          Prior Functioning/Environment              Frequency  Min 2X/week        Progress Toward Goals  OT Goals(current goals can now be found in the care plan section)  Progress towards OT goals: Progressing toward goals  Acute Rehab OT Goals Patient Stated Goal: none stated OT Goal Formulation: With patient Time For Goal Achievement: 07/31/22 Potential to Achieve Goals: Fair ADL Goals Pt Will Perform Grooming: with modified  independence;sitting Pt Will Transfer to Toilet: with mod assist;squat pivot transfer;stand pivot transfer;bedside commode Additional ADL Goal #1: pt will tolerate sitting EOB x10 min with supervision for functional task in prep for ADLs.  Plan Discharge plan remains appropriate;Frequency remains appropriate    Co-evaluation    PT/OT/SLP Co-Evaluation/Treatment: Yes Reason for Co-Treatment: Complexity of the patient's impairments (multi-system involvement);For patient/therapist safety;To address functional/ADL transfers   OT goals addressed during session: ADL's and self-care      AM-PAC OT "6 Clicks" Daily Activity     Outcome Measure   Help from another person eating meals?: A Little Help from another person taking care of personal grooming?: A Little Help from another person toileting, which includes using toliet, bedpan, or urinal?: A Lot Help from another person bathing (including washing, rinsing, drying)?: A Lot Help from another person to put on and taking off regular upper body clothing?: A Lot Help from another person to put on and taking off regular lower body clothing?: A Lot 6 Click Score: 14    End of Session Equipment Utilized During Treatment: Oxygen  OT Visit Diagnosis: Unsteadiness on feet (R26.81);Other abnormalities of gait and mobility (R26.89);Muscle weakness (generalized) (M62.81)   Activity Tolerance Patient tolerated treatment well   Patient Left in bed;with bed alarm set;with call bell/phone within reach   Nurse Communication Mobility status;Other (comment) (needs new purewick, has buttock wound)  Time: 1353-1416 OT Time Calculation (min): 23 min  Charges: OT General Charges $OT Visit: 1 Visit OT Treatments $Self Care/Home Management : 8-22 mins  Renaye Rakers, OTD, OTR/L SecureChat Preferred Acute Rehab [phone] - [place] 07/25/2022, 2:44 PM

## 2022-07-25 NOTE — Progress Notes (Signed)
PROGRESS NOTE    Valerie Rodgers  TDV:[id number] DOB: [birth year] DOA: 07/16/2022 PCP: Pcp, No  71/F  w/ COPD, chronic systolic CHF, cognitive deficits/dementia, CKD 3a, cirrhosis, bipolar disorder, polysubstance/cocaine abuse and paroxysmal atrial fibrillation long-term care resident at [facility] SN[facility] presented with dyspnea, and abdominal edema x 3 to 4 days of worsening dyspnea, that progressed to abdominal and lower extremity edema. In ED in Resp distress, placed on BIPAP, creat 1.8, AST 93 ALT 54, BNP >4,500, troponin 152 and 148 , Toxicology screen negative (was positive for cocaine on 03/2022).  CXR with cardiomegaly, bilateral hilar vascular congestion and bilateral diffuse interstitial infiltrates.   1/30 volume status improving with diuresis, noted to have cognitive deficits 2/2, underwent paracentesis, 3.1 L of clear yellow fluid drained  Subjective: Feels okay overall, breathing better   Assessment and Plan:  Acute on chronic systolic CHF 11/2692 echo with EF 20 to 25%, global hypokinesis. Interventricular septum is flattened in systole and diastole, RV preserved. RVSP 43.9. moderate dilatation of LA and RA. Moderate TR. albumin is 3.3 -Diuresed with IV Lasix, also underwent paracentesis 2/2, at least 13.2 L negative, urine output has been inaccurate, weight down 35lbs  -GDMT limited by CKD and hypotension,  -Switch to oral torsemide today, continue midodrine, blood pressure fluctuates between 80s to 100s without symptoms -Spoke to her friend [name] yesterday, unable to reach other family, patient is estranged from family for over 10 years, her friend [name] has been signing her papers for SNF etc. not formally her healthcare power of attorney, social work and chaplain consult for [facility] -I discussed poor prognosis with [name], and recommended palliative consult and need for hospice in the near future -Back to SNF tomorrow if blood pressure is more stable on oral diuretics  Liver  cirrhosis, ascites -Diuretics as noted above, underwent paracentesis 2/2, albumin given  Acute on chronic hypoxemic respiratory failure due to acute cardiogenic pulmonary edema. -wean O2  Troponin elevation due to heart failure exacerbation, no clinical signs of acute coronary syndrome.   ?  Dementia, cognitive deficits and debility Patient with advanced heart failure and very poor physical functional capacity.  Poor prognosis.   Atrial flutter, paroxysmal (HCC) -Remains in sinus rhythm.  Per chart review patient has declined anticoagulation in the past and currently  AKI-on CKD 3a -Stable, torsemide resumed  Hypokalemia -replace  COPD with acute exacerbation (HCC) No clinical signs of exacerbation, continue oxymetry monitoring.  Discontinued corticosteroids.   Bipolar disorder ([place]) With considerable cognitive deficits,?  Early dementia -B12 normal, TSH mildly elevated  Malnutrition of moderate degree Continue with nutritional supplements.    DVT prophylaxis: Heparin Code Status: DNR Family Communication: None present, called and updated her friend [name] yesterday Disposition Plan: Back to [facility] for long-term care likely Wednesday  Consultants:    Procedures:   Antimicrobials:    Objective: Vitals:   07/25/22 0900 07/25/22 1227 07/25/22 1232 07/25/22 1236  BP: (!) 72/56 107/69 (!) 83/73 102/75  Pulse:      Resp:  18    Temp:      TempSrc:      SpO2:      Weight:      Height:        Intake/Output Summary (Last 24 hours) at 07/25/2022 1238 Last data filed at 07/25/2022 0900 Gross per 24 hour  Intake 480 ml  Output 500 ml  Net -20 ml   Filed Weights   07/23/22 0545 07/24/22 0110 07/25/22 0419  Weight: 64 kg 63  kg 62.1 kg    Examination:  Chronically ill female laying in bed, awake alert oriented to self and place only, moderate cognitive deficits CVS: S1-S2, regular rhythm Lungs: Decreased at the bases otherwise clear Abdomen: Soft, only  mildly distended, nontender, bowel sounds present Extremities: Trace edema in left upper thigh   Data Reviewed:   CBC: Recent Labs  Lab 07/19/22 0102  WBC 8.9  HGB 11.0*  HCT 36.3  MCV 86.6  PLT 161*   Basic Metabolic Panel: Recent Labs  Lab 07/20/22 0051 07/21/22 0133 07/22/22 0033 07/23/22 0025 07/24/22 0033 07/25/22 0058  NA 140 145 142 139 138 140  K 3.4* 3.5 3.4* 2.9* 3.7 3.7  CL 100 100 93* 89* 88* 93*  CO2 30 35* 37* 39* 39* 39*  GLUCOSE 112* 85 126* 102* 84 83  BUN 51* 47* 45* 41* 34* 29*  CREATININE 1.49* 1.23* 1.37* 1.22* 1.18* 1.06*  CALCIUM 9.0 9.1 9.1 8.6* 8.8* 8.2*  MG 1.9  --   --   --   --   --    GFR: Estimated Creatinine Clearance: 41.4 mL/min (A) (by C-G formula based on SCr of 1.06 mg/dL (H)). Liver Function Tests: Recent Labs  Lab 07/19/22 0102 07/22/22 0033  AST 45* 34  ALT 46* 29  ALKPHOS 176* 120  BILITOT 1.6* 1.5*  PROT 6.4* 5.8*  ALBUMIN 3.3* 3.5   No results for input(s): "LIPASE", "AMYLASE" in the last 168 hours.  Recent Labs  Lab 07/19/22 0102  AMMONIA 22   Coagulation Profile: No results for input(s): "INR", "PROTIME" in the last 168 hours.  Cardiac Enzymes: No results for input(s): "CKTOTAL", "CKMB", "CKMBINDEX", "TROPONINI" in the last 168 hours. BNP (last 3 results) No results for input(s): "PROBNP" in the last 8760 hours. HbA1C: No results for input(s): "HGBA1C" in the last 72 hours. CBG: No results for input(s): "GLUCAP" in the last 168 hours. Lipid Profile: No results for input(s): "CHOL", "HDL", "LDLCALC", "TRIG", "CHOLHDL", "LDLDIRECT" in the last 72 hours. Thyroid Function Tests: No results for input(s): "TSH", "T4TOTAL", "FREET4", "T3FREE", "THYROIDAB" in the last 72 hours.  Anemia Panel: No results for input(s): "VITAMINB12", "FOLATE", "FERRITIN", "TIBC", "IRON", "RETICCTPCT" in the last 72 hours. Urine analysis:    Component Value Date/Time   COLORURINE YELLOW 07/16/2022 1253   APPEARANCEUR HAZY (A)  07/16/2022 1253   LABSPEC 1.006 07/16/2022 1253   PHURINE 5.0 07/16/2022 1253   GLUCOSEU NEGATIVE 07/16/2022 1253   HGBUR MODERATE (A) 07/16/2022 1253   BILIRUBINUR NEGATIVE 07/16/2022 1253   KETONESUR NEGATIVE 07/16/2022 1253   PROTEINUR NEGATIVE 07/16/2022 1253   NITRITE NEGATIVE 07/16/2022 1253   LEUKOCYTESUR LARGE (A) 07/16/2022 1253   Sepsis Labs: @LABRCNTIP (procalcitonin:4,lacticidven:4)  ) Recent Results (from the past 240 hour(s))  Resp panel by RT-PCR (RSV, Flu A&B, Covid) Anterior Nasal Swab     Status: None   Collection Time: 07/16/22  1:08 PM   Specimen: Anterior Nasal Swab  Result Value Ref Range Status   SARS Coronavirus 2 by RT PCR NEGATIVE NEGATIVE Final    Comment: (NOTE) SARS-CoV-2 target nucleic acids are NOT DETECTED.  The SARS-CoV-2 RNA is generally detectable in upper respiratory specimens during the acute phase of infection. The lowest concentration of SARS-CoV-2 viral copies this assay can detect is 138 copies/mL. A negative result does not preclude SARS-Cov-2 infection and should not be used as the sole basis for treatment or other patient management decisions. A negative result may occur with  improper specimen collection/handling, submission of  specimen other than nasopharyngeal swab, presence of viral mutation(s) within the areas targeted by this assay, and inadequate number of viral copies(<138 copies/mL). A negative result must be combined with clinical observations, patient history, and epidemiological information. The expected result is Negative.  Fact Sheet for Patients:  EntrepreneurPulse.com.au  Fact Sheet for Healthcare Providers:  IncredibleEmployment.be  This test is no t yet approved or cleared by the [place] FDA and  has been authorized for detection and/or diagnosis of SARS-CoV-2 by FDA under an Emergency Use Authorization (EUA). This EUA will remain  in effect (meaning this test can be  used) for the duration of the COVID-19 declaration under Section 564(b)(1) of the Act, 21 U.S.C.section 360bbb-3(b)(1), unless the authorization is terminated  or revoked sooner.       Influenza A by PCR NEGATIVE NEGATIVE Final   Influenza B by PCR NEGATIVE NEGATIVE Final    Comment: (NOTE) The Xpert Xpress SARS-CoV-2/FLU/RSV plus assay is intended as an aid in the diagnosis of influenza from Nasopharyngeal swab specimens and should not be used as a sole basis for treatment. Nasal washings and aspirates are unacceptable for Xpert Xpress SARS-CoV-2/FLU/RSV testing.  Fact Sheet for Patients: EntrepreneurPulse.com.au  Fact Sheet for Healthcare Providers: IncredibleEmployment.be  This test is not yet approved or cleared by the [place] FDA and has been authorized for detection and/or diagnosis of SARS-CoV-2 by FDA under an Emergency Use Authorization (EUA). This EUA will remain in effect (meaning this test can be used) for the duration of the COVID-19 declaration under Section 564(b)(1) of the Act, 21 U.S.C. section 360bbb-3(b)(1), unless the authorization is terminated or revoked.     Resp Syncytial Virus by PCR NEGATIVE NEGATIVE Final    Comment: (NOTE) Fact Sheet for Patients: EntrepreneurPulse.com.au  Fact Sheet for Healthcare Providers: IncredibleEmployment.be  This test is not yet approved or cleared by the [place] FDA and has been authorized for detection and/or diagnosis of SARS-CoV-2 by FDA under an Emergency Use Authorization (EUA). This EUA will remain in effect (meaning this test can be used) for the duration of the COVID-19 declaration under Section 564(b)(1) of the Act, 21 U.S.C. section 360bbb-3(b)(1), unless the authorization is terminated or revoked.  Performed at [facility], [place] [address], [place], [place] [address]      Radiology Studies: No results  found.   Scheduled Meds:  feeding supplement  237 mL Oral BID BM   heparin  5,000 Units Subcutaneous Q12H   midodrine  10 mg Oral TID   mometasone-formoterol  2 puff Inhalation BID   multivitamin with minerals  1 tablet Oral Daily   sodium chloride flush  3 mL Intravenous Q12H   traZODone  50 mg Oral QPM   Continuous Infusions:  sodium chloride       LOS: 9 days    Time spent: 44min    Domenic Polite, MD [facility]   07/25/2022, 12:38 PM

## 2022-07-25 NOTE — Consult Note (Signed)
Consultation Note Date: 07/25/2022   Patient Name: Valerie Rodgers  DOB: [birth year]  MRN: [id number]  Age / Sex: 72 y.o., female  PCP: Pcp, No Referring Physician: Domenic Polite, MD  Reason for Consultation: Establishing goals of care and Psychosocial/spiritual support  HPI/Patient Profile: 72 y.o. 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   Admitted for treatment stabilization.  Today is day 9 of this hospitalization  Decisions related to treatment options, advanced directives and anticipatory care needs pending.  Clinical Assessment and Goals of Care:  This NP Valerie Rodgers reviewed medical records, received report from team, assessed the patient and then meet at the patient's bedside and spoke to [facility]erie [employer]  Rodgers/ docuemtned HPOA  to discuss diagnosis, prognosis, GOC, EOL wishes disposition and options.  During hospital stay in May 2023 this nurse practitioner spoke in detail with patient and Valerie Rodgers it was at that time that we secured H POA and living will documents.   Concept of Palliative Care was re-introduced as specialized medical care for people and their families living with serious illness.  If focuses on providing relief from the symptoms and stress of a serious illness.  The goal is to improve quality of life for both the patient and the family.  Values and goals of care important to patient and family were attempted to be elicited.  A  discussion was had today regarding advanced directives.  Concepts specific to code status, artifical feeding and hydration, continued IV  antibiotics and rehospitalization was had.    The difference between a aggressive medical intervention path  and a palliative comfort care path for this patient at this time was had.     Education offered on hospice benefit; philosophy and eligibility  Valerie Rodgers understands that the patient continues to fail to thrive, he is looking for direction from the medical team regarding neck steps in treatment plan.           I discussed with Valerie Rodgers plan to talk with Valerie Rodgers together tomorrow afternoon helping him navigate pending treatment option decisions.      Questions and concerns addressed.      PMT will continue to support holistically.     HCPOA/ Valerie Rodgers     SUMMARY OF RECOMMENDATIONS    Code Status/Advance Care Planning: DNR   Palliative Prophylaxis:  Aspiration, Delirium Protocol, Frequent Pain Assessment, and Oral Care  Additional Recommendations (Limitations, Scope, Preferences): Avoid Hospitalization and No Artificial Feeding  Psycho-social/Spiritual:  Desire for further Chaplaincy support:no Additional Recommendations: Education on Hospice  Prognosis:  Unable to determine  Discharge Planning: To Be Determined      Primary Diagnoses: Present on Admission:  Acute on chronic systolic CHF (congestive heart failure) ([place])  Bipolar disorder ([place])  COPD with acute exacerbation (HCC)  Malnutrition of moderate degree  Atrial flutter, paroxysmal (HCC)  Stage 3a chronic kidney disease (CKD) ([place])  Essential hypertension   I have reviewed the medical record, interviewed the patient and family, and examined the patient. The following aspects are pertinent.  Past Medical History:  Diagnosis Date   Arthritis    Bipolar affective disorder ([place])    Cataract    Social History   Socioeconomic History   Marital status: Widowed    Spouse name: Not on file   Number of children: 1   Years of education: Not on file   Highest education level: Not on file   Occupational History   Not on file  Tobacco Use   Smoking status: Some Days    Packs/day: 0.25    Years: 65.00    Total pack years: 16.25    Types: Cigarettes   Smokeless tobacco: Never  Vaping Use   Vaping Use: Never used  Substance and Sexual Activity   Alcohol use: Yes    Comment: 1 pint weekly   Drug use: Yes    Types: Cocaine, Marijuana   Sexual activity: Not on file  Other Topics Concern   Not on file  Social History Narrative   Not on file   Social Determinants of Health   Financial Resource Strain: Not on file  Food Insecurity: No Food Insecurity (07/22/2022)   Hunger Vital Sign    Worried About Running Out of Food in the Last Year: Never true    Ran Out of Food in the Last Year: Never true  Transportation Needs: No Transportation Needs (07/22/2022)   PRAPARE - Hydrologist (Medical): No    Lack of Transportation (Non-Medical): No  Physical Activity: Not on file  Stress: Not on file  Social Connections: Not on file   Family History  Problem Relation Age of Onset   Stroke Father    Colon cancer Neg Hx    Heart disease Neg Hx    Scheduled Meds:  feeding supplement  237 mL Oral BID BM   heparin  5,000 Units Subcutaneous Q12H   midodrine  10 mg Oral TID   mometasone-formoterol  2 puff Inhalation BID   multivitamin with minerals  1 tablet Oral Daily   sodium chloride flush  3 mL Intravenous Q12H   torsemide  20 mg Oral Daily   traZODone  50 mg Oral QPM   Continuous Infusions:  sodium chloride     PRN Meds:.sodium chloride, acetaminophen, ipratropium-albuterol, lactulose, melatonin, ondansetron (ZOFRAN) IV, sodium chloride flush Medications Prior to Admission:  Prior to Admission medications   Medication Sig Start Date End Date Taking? Authorizing Provider  atorvastatin (LIPITOR) 20 MG tablet Take 1 tablet (20 mg total) by mouth at bedtime. 11/18/21 07/16/22 Yes Arrien, Jimmy Picket, MD  calcitRIOL (ROCALTROL) 0.25 MCG capsule  Take 0.25 mcg by mouth daily. 01/13/22  Yes [provider]  furosemide (LASIX) 40 MG tablet Take 40 mg by mouth daily. 03/05/22  Yes [provider]  [facility]) OINT Apply 1 Application topically See admin instructions. Apply to buttock two times per day and after each incontinent episode.   Yes [provider]  ivabradine (CORLANOR) 5 MG TABS tablet Take 1 tablet (5 mg total) by mouth 2 (two) times daily with a meal. 11/18/21  Yes Arrien, Jimmy Picket, MD  midodrine (PROAMATINE) 10 MG tablet Take 1 tablet (10 mg  total) by mouth 2 (two) times daily with a meal. 03/20/22  Yes Valerie Polite, MD  Multiple Vitamin (MULTIVITAMIN WITH MINERALS) TABS tablet Take 1 tablet by mouth daily. 12/05/21  Yes Reed, Tiffany L, DO  Polyvinyl Alcohol-Povidone (REFRESH OP) Place 1-2 drops into both eyes every 6 (six) hours as needed (dry eye).   Yes [provider]  potassium chloride (KLOR-CON) 10 MEQ tablet Take 10 mEq by mouth daily. 03/05/22  Yes [provider]  Skin Protectants, Misc. (EUCERIN) cream Apply 1 Application topically 3 (three) times daily as needed for dry skin.   Yes [provider]  zinc oxide 20 % ointment Apply 1 Application topically as needed (each incontinent episode). 05/20/22  Yes [provider]   No Known Allergies Review of Systems  Unable to perform ROS All other systems reviewed and are negative.   Physical Exam Constitutional:      Appearance: She is underweight. She is ill-appearing.  Cardiovascular:     Rate and Rhythm: Normal rate.  Pulmonary:     Effort: Pulmonary effort is normal.  Neurological:     Mental Status: She is alert.     Vital Signs: BP 102/75 (BP Location: Right Leg)   Pulse 81   Temp 97.6 F (36.4 C) (Oral)   Resp 18   Ht 5\' 4"  (1.626 m)   Wt 62.1 kg   SpO2 96%   BMI 23.50 kg/m  Pain Scale: Faces   Pain Score: 0-No pain   SpO2: SpO2: 96 % O2 Device:SpO2: 96  % O2 Flow Rate: .O2 Flow Rate (L/min): 2 L/min  IO: Intake/output summary:  Intake/Output Summary (Last 24 hours) at 07/25/2022 1339 Last data filed at 07/25/2022 1300 Gross per 24 hour  Intake 720 ml  Output 500 ml  Net 220 ml    LBM: Last BM Date : 07/24/22 Baseline Weight: Weight: 78 kg Most recent weight: Weight: 62.1 kg     Palliative Assessment/Data:  30 % at best   Discussed with Dr Broadus Rodgers and nursing staff  Time In: 1400 Time Out: 1515 Time Total: 75 minutes Greater than 50%  of this time was spent counseling and coordinating care related to the above assessment and plan.  Signed by: Valerie Lessen, NP   Please contact Palliative Medicine Team phone at [phone] for questions and concerns.  For individual provider: See Shea Evans

## 2022-07-26 DIAGNOSIS — R531 Weakness: Secondary | ICD-10-CM

## 2022-07-26 DIAGNOSIS — J441 Chronic obstructive pulmonary disease with (acute) exacerbation: Secondary | ICD-10-CM | POA: Diagnosis not present

## 2022-07-26 DIAGNOSIS — I5043 Acute on chronic combined systolic (congestive) and diastolic (congestive) heart failure: Secondary | ICD-10-CM | POA: Diagnosis not present

## 2022-07-26 DIAGNOSIS — N1831 Chronic kidney disease, stage 3a: Secondary | ICD-10-CM | POA: Diagnosis not present

## 2022-07-26 MED ORDER — POTASSIUM CHLORIDE CRYS ER 20 MEQ PO TBCR
40.0000 meq | EXTENDED_RELEASE_TABLET | Freq: Once | ORAL | Status: AC
  Administered 2022-07-26: 40 meq via ORAL
  Filled 2022-07-26: qty 2

## 2022-07-26 MED ORDER — TORSEMIDE 20 MG PO TABS
40.0000 mg | ORAL_TABLET | Freq: Every day | ORAL | Status: DC
  Administered 2022-07-26: 40 mg via ORAL
  Filled 2022-07-26: qty 2

## 2022-07-26 MED ORDER — MELATONIN 10 MG PO TABS: 10.0000 mg | ORAL_TABLET | Freq: Every evening | ORAL | 0 refills | Status: AC | PRN

## 2022-07-26 MED ORDER — TRAZODONE HCL 50 MG PO TABS: 50.0000 mg | ORAL_TABLET | Freq: Every evening | ORAL | Status: AC

## 2022-07-26 MED ORDER — ACETAMINOPHEN 325 MG PO TABS: 650.0000 mg | ORAL_TABLET | ORAL | Status: AC | PRN

## 2022-07-26 MED ORDER — LACTULOSE 10 GM/15ML PO SOLN: 10.0000 g | Freq: Two times a day (BID) | ORAL | 0 refills | Status: AC

## 2022-07-26 MED ORDER — TORSEMIDE 40 MG PO TABS: 40.0000 mg | ORAL_TABLET | Freq: Every day | ORAL | 0 refills | Status: AC

## 2022-07-26 NOTE — Progress Notes (Signed)
Manufacturing engineer [facility]) Hospital Liaison Note  Received request from Transitions of Care Manager, Joi T., for hospice services at [facility] after discharge. Chart and patient information under review by [facility] physician.    Spoke with friend/James to initiate education related to hospice philosophy, services, and team approach to care. [name] verbalized understanding of information given.   Per discussion, the plan is for patient to discharge via PTAR once cleared to DC. No DME needs required as patient is discharging to LTC and DME needs will be provided.    Please send signed and completed DNR home with patient/family. Please provide prescriptions at discharge as needed to ensure ongoing symptom management.    [facility] information and contact numbers given to family & above information shared with TOC.   Please call with any questions/concerns.    Thank you for the opportunity to participate in this patient's care.   Phillis Haggis, MSW [facility] Liaison  [phone]

## 2022-07-26 NOTE — NC FL2 (Signed)
[place] LEVEL OF CARE FORM     IDENTIFICATION  Patient Name: Valerie Rodgers Birthdate: 21-May-1951 Sex: female Admission Date (Current Location): 07/16/2022  [facility] and [place] Number:  Herbalist and Address:  [facility], [place] [address], [place], [place] [address]      Provider Number: [id number]  Attending Physician Name and Address:  Domenic Polite, MD  Relative Name and Phone Number:  [name] [facility]) [phone]    Current Level of Care: Hospital Recommended Level of Care: [place] Prior Approval Number:    Date Approved/Denied:   PASRR Number: [id number]  Discharge Plan: SNF    Current Diagnoses: Patient Active Problem List   Diagnosis Date Noted   Malnutrition of moderate degree 07/17/2022   CHF (congestive heart failure) ([place]) 07/16/2022   Bilateral cellulitis of lower leg    Severe sepsis without septic shock ([place])    Bacteremia    Acute on chronic respiratory failure with hypoxia and hypercapnia ([place]) 03/25/2022   Elevated troponin 03/25/2022   Cellulitis 03/25/2022   Acute encephalopathy 03/25/2022   COPD with acute exacerbation ([place]) 03/13/2022   Decubitus ulcer of left buttock 03/13/2022   Stage 3a chronic kidney disease (CKD) ([place]) 03/12/2022   Mixed hyperlipidemia 03/12/2022   Nicotine dependence, cigarettes, uncomplicated 123456   Acute on chronic systolic CHF (congestive heart failure) ([place]) 11/13/2021   Atrial flutter, paroxysmal ([place]) 11/13/2021   Elevated LFTs 08/10/2021   HFrEF (heart failure with reduced ejection fraction) (HCC)    Cocaine abuse ([place]) 07/18/2021   Bipolar disorder ([place]) 07/18/2021   Essential hypertension 07/18/2021    Orientation RESPIRATION BLADDER Height & Weight     Self  O2 (3 liters) Incontinent, External catheter Weight: 140 lb 3.4 oz (63.6 kg) Height:  5' 4"$  (162.6 cm)  BEHAVIORAL SYMPTOMS/MOOD NEUROLOGICAL BOWEL NUTRITION STATUS       Incontinent Diet (See dc summary)  AMBULATORY STATUS COMMUNICATION OF NEEDS Skin   Extensive Assist Verbally Normal                       Personal Care Assistance Level of Assistance  Dressing, Feeding, Bathing Bathing Assistance: Maximum assistance Feeding assistance: Limited assistance Dressing Assistance: Maximum assistance     Functional Limitations Info  Sight, Hearing, Speech Sight Info: Impaired Hearing Info: Adequate Speech Info: Adequate    SPECIAL CARE FACTORS FREQUENCY                       Contractures Contractures Info: Not present    Additional Factors Info  Code Status, Allergies Code Status Info: DNR Allergies Info: NKA           Current Medications (07/26/2022):  This is the current hospital active medication list Current Facility-Administered Medications  Medication Dose Route Frequency Provider Last Rate Last Admin   0.9 %  sodium chloride infusion  250 mL Intravenous PRN Wynetta Fines T, MD       acetaminophen (TYLENOL) tablet 650 mg  650 mg Oral Q4H PRN Wynetta Fines T, MD   650 mg at 07/25/22 2154   feeding supplement (ENSURE ENLIVE / ENSURE PLUS) liquid 237 mL  237 mL Oral BID BM Arrien, Jimmy Picket, MD   237 mL at 07/26/22 1313   ipratropium-albuterol (DUONEB) 0.5-2.5 (3) MG/3ML nebulizer solution 3 mL  3 mL Nebulization Q6H PRN Domenic Polite, MD       lactulose ([place]) 10 GM/15ML  solution 30 g  30 g Oral BID PRN Wynetta Fines T, MD   30 g at 07/23/22 1927   melatonin tablet 10 mg  10 mg Oral QHS PRN Kristopher Oppenheim, DO   10 mg at 07/23/22 2056   midodrine (PROAMATINE) tablet 10 mg  10 mg Oral TID Domenic Polite, MD   10 mg at 07/26/22 0854   mometasone-formoterol (DULERA) 200-5 MCG/ACT inhaler 2 puff  2 puff Inhalation BID Wynetta Fines T, MD   2 puff at 07/26/22 0837   ondansetron (ZOFRAN) injection 4 mg  4 mg Intravenous Q6H PRN Wynetta Fines T, MD       sodium chloride flush (NS) 0.9 % injection 3 mL  3 mL Intravenous Q12H Wynetta Fines T,  MD   3 mL at 07/26/22 0855   sodium chloride flush (NS) 0.9 % injection 3 mL  3 mL Intravenous PRN Wynetta Fines T, MD       torsemide [facility]) tablet 40 mg  40 mg Oral Daily Domenic Polite, MD   40 mg at 07/26/22 0853   traZODone (DESYREL) tablet 50 mg  50 mg Oral QPM Domenic Polite, MD   50 mg at 07/25/22 1818     Discharge Medications: Please see discharge summary for a list of discharge medications.  Relevant Imaging Results:  Relevant Lab Results:   Additional Information SSN: [id number]  Natarsha Hurwitz, MSW, LCSWA, LCASA Transitions of Care  Clinical Social Worker I

## 2022-07-26 NOTE — TOC Transition Note (Signed)
Transition of Care [facility]) - CM/SW Discharge Note   Patient Details  Name: Valerie Rodgers MRN: [id number] Date of Birth: 01/22/1951  Transition of Care [facility]) CM/SW Contact:  Bjorn Pippin, LCSW Phone Number: 07/26/2022, 3:26 PM   Clinical Narrative:    Patient will DC to:  [facility]  Anticipated DC date: 07/26/2022 Family notified: [name] [facility]) Transport by: Corey Harold   Per MD patient ready for DC to [facility]. RN, patient, patient's family, and facility notified of DC. Discharge Summary and FL2 sent to facility. RN to call report prior to discharge [phone]. DC packet on chart. Ambulance transport requested for patient.   CSW will sign off for now as social work intervention is no longer needed. Please consult [place] again if new needs arise.     Final next level of care: [place] Barriers to Discharge: No Barriers Identified   Patient Goals and CMS Choice      Discharge Placement                Patient chooses bed at: [place] Patient to be transferred to facility by: [place] Name of family member notified: [name] [facility]) Patient and family notified of of transfer: 07/26/22  Discharge Plan and Services Additional resources added to the After Visit Summary for                                       Social Determinants of Health (SDOH) Interventions SDOH Screenings   Food Insecurity: No Food Insecurity (07/22/2022)  Housing: Low Risk  (07/22/2022)  Transportation Needs: No Transportation Needs (07/22/2022)  Utilities: Not At Risk (07/22/2022)  Tobacco Use: High Risk (07/21/2022)     Readmission Risk Interventions    03/17/2022    2:55 PM  Readmission Risk Prevention Plan  HRI or [place] Complete  Social Work Consult for [place] Planning/Counseling Complete  Palliative Care Screening Not Applicable  Medication Review Press photographer) Referral to Pharmacy    Beckey Rutter, MSW, [place], LCASA Transitions of  Care  Clinical Social Worker I

## 2022-07-26 NOTE — Progress Notes (Signed)
Patient ID: Valerie Rodgers, female   DOB: 1951/03/04, 72 y.o.   MRN: [id number]    Progress Note from the Palliative Medicine Team at [facility]   Patient Name: Valerie Rodgers        Date: 07/26/2022 DOB: 1950/08/06  Age: 72 y.o. MRN#: [id number] Attending Physician: Domenic Polite, MD Primary Care Physician: Pcp, No Admit Date: 07/16/2022   Medical records reviewed   72 y.o. 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  Patient has had continued physical, functional and cognitive decline over the past 12 months.  She is a long-term resident of [facility] skilled nursing facility   Admitted for treatment stabilization.     Decisions related to treatment options, advanced directives and anticipatory care needs pending.  This NP assessed patient at the bedside as a follow up for palliative medicine needs and emotional support.  Patient is alert and oriented to person.  She appears comfortable.  Patient does not have medical decision-making capacity for herself.  I spoke to her [place] by telephone.  Continue conversation regarding current medical situation.  Detailed the difference between aggressive medical intervention path and a palliative comfort path for this patient, at this time in this situation.    Education offered on hospice benefit; philosophy and eligibility  Plan of care -DNR/DNI- placed on chart -Transition back to skilled nursing facility with hospice services -Focus of care is comfort, quality and dignity     -Avoid rehospitalization's     -No further diagnostics, lab draws     -Minimize medications utilizing only those to provide comfort      - see [place] document in [facility] -Prognosis is less than 6 months  Goals of care to document completed in [facility]   Questions and concerns addressed   Discussed with Dr Broadus John  50 minutes   Wadie Lessen NP  Palliative Medicine Team Team Phone # [phone] Pager [phone]

## 2022-07-26 NOTE — TOC Progression Note (Signed)
Transition of Care [facility]) - Progression Note    Patient Details  Name: Valerie Rodgers MRN: [id number] Date of Birth: [birth year]  Transition of Care [facility]) CM/SW [place], LCSW Phone Number: 07/26/2022, 2:36 PM  Clinical Narrative:  CSW was informed by MD and NP that pt HCPOA agrees to transition pt to full comfort care and hospice at the SNF. Valerie Rodgers notified CSW that they use [facility]. CSW made referral to [facility] to have hospice set up when pt returns to facility.   TOC will remain available should any additional needs arise.     Expected Discharge Plan: [place] Barriers to Discharge: Continued Medical Work up  Expected Discharge Plan and [place] arrangements for the past 2 months: [place] Expected Discharge Date: 07/26/22                                     Social Determinants of Health (SDOH) Interventions SDOH Screenings   Food Insecurity: No Food Insecurity (07/22/2022)  Housing: [place]  (07/22/2022)  Transportation Needs: No Transportation Needs (07/22/2022)  Utilities: Not At Risk (07/22/2022)  Tobacco Use: High Risk (07/21/2022)    Readmission Risk Interventions    03/17/2022    2:55 PM  Readmission Risk Prevention Plan  HRI or [place] Complete  Social Work Consult for [place] Planning/Counseling Complete  Palliative Care Screening Not Applicable  Medication Review Press photographer) Referral to Pharmacy   Valerie Rodgers, MSW, [place], LCASA Transitions of Care  Clinical Social Worker I

## 2022-07-26 NOTE — Care Management Important Message (Signed)
Important Message  Patient Details  Name: ASTRYD PEARCY MRN: [id number] Date of Birth: 02-Jul-1950   Medicare Important Message Given:  Yes     [place], [place] 07/26/2022, 9:40 AM

## 2022-07-26 NOTE — Progress Notes (Signed)
Nutrition Follow-up  DOCUMENTATION CODES:   Non-severe (moderate) malnutrition in context of chronic illness  INTERVENTION:  Continue regular diet as ordered for widest variety of menu options to optimize PO intake Ensure Enlive po BID, each supplement provides 350 kcal and 20 grams of protein. Magic cup TID with meals, each supplement provides 290 kcal and 9 grams of protein MVI with minerals daily  NUTRITION DIAGNOSIS:  Moderate Malnutrition related to chronic illness as evidenced by mild fat depletion, mild muscle depletion. - remains applicable  GOAL:  Patient will meet greater than or equal to 90% of their needs - goal unmet  MONITOR:  I & O's, PO intake, Supplement acceptance  REASON FOR ASSESSMENT:   Consult Assessment of nutrition requirement/status  ASSESSMENT:   Pt with hx of COPD on home O2, CHF, CKD3b, cirrhosis, and hx drug/tobacco abuse presented to ED fron SNF with SOB and swelling.  PMT consulted d/t ongoing FTT. No artificial feeding desired. Now DNR with ongoing PMT support. Plans for meeting today and then d/c to SNF.   Meal completions: 2/4: 5% breakfast, 80% lunch, 50% dinner 2/5: 5% lunch 2/6: 25% breakfast, 30% lunch, 20% dinner 2/7: 90% breakfast  PO intake remains variable. Intermittently drinking Ensure but remains inconsistent.   Weight remains variable throughout admission although continues to gradually trend down. Continues with intermittent diuresis, limited by hypotension.   Admit weight: 79.1 kg Current weight: 63.6 kg + 3.3 lbs over night  Medications: MVI, klor-con, torsemide  Labs: reviewed  I/O's: -11.9L since admit  Diet Order:   Diet Order             Diet regular Room service appropriate? Yes; Fluid consistency: Thin  Diet effective now                   EDUCATION NEEDS:   Not appropriate for education at this time  Skin:  Skin Assessment: Reviewed RN Assessment  Last BM:  unsure  Height:   Ht  Readings from Last 1 Encounters:  07/17/22 5\' 4"  (1.626 m)    Weight:   Wt Readings from Last 1 Encounters:  07/26/22 63.6 kg    Ideal Body Weight:  54.5 kg  BMI:  Body mass index is 24.07 kg/m.  Estimated Nutritional Needs:   Kcal:  1600-1800 kcal/d  Protein:  80-100g/d  Fluid:  1.8L/d  [place] Dana, RDN, LDN Clinical Nutrition

## 2022-07-26 NOTE — Discharge Summary (Signed)
Physician Discharge Summary  KHALESSI BLOUGH FAO:[id number] DOB: 02-21-1951 DOA: 07/16/2022  PCP: Pcp, No  Admit date: 07/16/2022 Discharge date: 07/26/2022  Time spent: 35 minutes  Recommendations for Outpatient Follow-up:  Back to SNF with Hospice for comfort focused care   Discharge Diagnoses:  Active Problems:   Acute on chronic systolic CHF (congestive heart failure) (HCC)   Chronic hypotension   Cirrhosis   CKD 3a   Dementia   Atrial flutter, paroxysmal (HCC)   Stage 3a chronic kidney disease (CKD) (HCC)   COPD with acute exacerbation (HCC)   Bipolar disorder (HCC)   Malnutrition of moderate degree   Discharge Condition: guarded  Diet recommendation: low sodium  Filed Weights   07/24/22 0110 07/25/22 0419 07/26/22 0506  Weight: 63 kg 62.1 kg 63.6 kg    History of present illness:  71/F  w/ COPD, chronic systolic CHF, cognitive deficits/dementia, CKD 3a, cirrhosis, bipolar disorder, polysubstance/cocaine abuse and paroxysmal atrial fibrillation long-term care resident at [facility] SNF presented with dyspnea, and abdominal edema x 3 to 4 days of worsening dyspnea, that progressed to abdominal and lower extremity edema. In ED in Resp distress, placed on BIPAP, creat 1.8, AST 93 ALT 54, BNP >4,500, troponin 152 and 148 , Toxicology screen negative (was positive for cocaine on 03/2022).  CXR with cardiomegaly, bilateral hilar vascular congestion and bilateral diffuse interstitial infiltrates. 1/30 volume status improving with diuresis, noted to have cognitive deficits 2/2, underwent paracentesis, 3.1 L of clear yellow fluid drained 2/3-2/7: Continue intermittent diuresis limited by hypotension 2/6: Palliative care consulted  Hospital Course:   Acute on chronic systolic CHF 69/6295 echo with EF 20 to 25%, global hypokinesis. Interventricular septum is flattened in systole and diastole, RV preserved. RVSP 43.9. moderate dilatation of LA and RA. Moderate TR -Diuresed with  IV Lasix, also underwent paracentesis 2/2, at least 13.2 L negative, urine output has been inaccurate, weight down 35lbs -GDMT limited by CKD and hypotension,  -Continue oral torsemide dose increased,, continue midodrine, blood pressure fluctuates between 80s to 100s without symptoms -Spoke to her friend and [place] for last few days,  patient is estranged from family for over 11 years, her friend [name] has been signing her papers for SNF, per palliative team he is her HCPOA -I discussed poor prognosis with [name], and recommended palliative consult and consideration of hospice at discharge, on account of advanced CHF with hypotension, Cirrhosis and dementia with bed bound poor functional status, palliative meeting completed today, she will discharge back to [facility] SNF with hospice services today   Liver cirrhosis, ascites -Diuretics as noted above, underwent paracentesis 2/2, albumin given   Acute on chronic hypoxemic respiratory failure due to acute cardiogenic pulmonary edema. -weaned off  O2   Troponin elevation due to heart failure exacerbation, no ACS    Dementia, cognitive deficits and debility Patient with advanced heart failure and very poor functional status, cognitive deficits Poor prognosis.    Atrial flutter, paroxysmal (HCC) -Remains in sinus rhythm.  Per chart review patient has declined anticoagulation in the past and currently   AKI-on CKD 3a -Stable, torsemide resumed   Hypokalemia -replaced   COPD with acute exacerbation (HCC) No clinical signs of exacerbation, Discontinued corticosteroids.    Bipolar disorder ([place]) With considerable cognitive deficits,?  Early dementia -B12 normal, TSH mildly elevated   Malnutrition of moderate degree Continue with nutritional supplements.    Code Status: DNR  Discharge Exam: Vitals:   07/26/22 0837 07/26/22 1117  BP:  112/72  Pulse:  61  Resp:  19  Temp:    SpO2: 100% 93%   Chronically ill female  laying in bed, awake alert oriented to self and place only, significant cognitive deficits CVS: S1-S2, regular rhythm Lungs: Decreased breath sounds at the bases Abdomen: Soft, mildly distended, nontender, bowel sounds present Extremities: Trace edema in left upper thigh  Discharge Instructions    Allergies as of 07/26/2022   No Known Allergies      Medication List     STOP taking these medications    furosemide 40 MG tablet Commonly known as: LASIX       TAKE these medications    acetaminophen 325 MG tablet Commonly known as: TYLENOL Take 2 tablets (650 mg total) by mouth every 4 (four) hours as needed for headache or mild pain.   atorvastatin 20 MG tablet Commonly known as: LIPITOR Take 1 tablet (20 mg total) by mouth at bedtime.   calcitRIOL 0.25 MCG capsule Commonly known as: ROCALTROL Take 0.25 mcg by mouth daily.   Corlanor 5 MG Tabs tablet Generic drug: ivabradine Take 1 tablet (5 mg total) by mouth 2 (two) times daily with a meal.   Dermacloud Oint Apply 1 Application topically See admin instructions. Apply to buttock two times per day and after each incontinent episode.   eucerin cream Apply 1 Application topically 3 (three) times daily as needed for dry skin.   lactulose 10 GM/15ML solution Commonly known as: CHRONULAC Take 15 mLs (10 g total) by mouth 2 (two) times daily.   Melatonin 10 MG Tabs Take 10 mg by mouth at bedtime as needed.   midodrine 10 MG tablet Commonly known as: PROAMATINE Take 1 tablet (10 mg total) by mouth 2 (two) times daily with a meal.   multivitamin with minerals Tabs tablet Take 1 tablet by mouth daily.   potassium chloride 10 MEQ tablet Commonly known as: KLOR-CON Take 10 mEq by mouth daily.   REFRESH OP Place 1-2 drops into both eyes every 6 (six) hours as needed (dry eye).   Torsemide 40 MG Tabs Take 40 mg by mouth daily. Start taking on: July 27, 2022   traZODone 50 MG tablet Commonly known as:  DESYREL Take 1 tablet (50 mg total) by mouth every evening.   zinc oxide 20 % ointment Apply 1 Application topically as needed (each incontinent episode).       No Known Allergies    The results of significant diagnostics from this hospitalization (including imaging, microbiology, ancillary and laboratory) are listed below for reference.    Significant Diagnostic Studies: IR Paracentesis  Result Date: 07/21/2022 INDICATION: Patient with abdominal distention, ascites. Request is made for therapeutic paracentesis. EXAM: ULTRASOUND GUIDED THERAPEUTIC PARACENTESIS MEDICATIONS: 10 mL 1% lidocaine COMPLICATIONS: None immediate. PROCEDURE: Informed written consent was obtained from the patient after a discussion of the risks, benefits and alternatives to treatment. A timeout was performed prior to the initiation of the procedure. Initial ultrasound scanning demonstrates a moderate amount of ascites within the left lateral abdomen. The left lateral abdomen was prepped and draped in the usual sterile fashion. 1% lidocaine was used for local anesthesia. Following this, a 19 gauge, 7-cm, Yueh catheter was introduced. An ultrasound image was saved for documentation purposes. The paracentesis was performed. The catheter was removed and a dressing was applied. The patient tolerated the procedure well without immediate post procedural complication. FINDINGS: A total of approximately 3.1 liters of clear, yellow fluid was removed. Samples were sent  to the laboratory as requested by the clinical team. IMPRESSION: Successful ultrasound-guided paracentesis yielding 3.1 liters of peritoneal fluid. Read by: Brynda Greathouse PA-C PLAN: If the patient eventually requires >/=2 paracenteses in a 30 day period, candidacy for formal evaluation by the [facility] will be assessed. Electronically Signed   By: Aletta Edouard M.D.   On: 07/21/2022 17:33   US Abdomen Limited RUQ  (LIVER/GB)  Result Date: 07/16/2022 CLINICAL DATA:  Hepatitis. EXAM: ULTRASOUND ABDOMEN LIMITED RIGHT UPPER QUADRANT COMPARISON:  March 25, 2022 FINDINGS: Gallbladder: The gallbladder wall measures 6 mm. The gallbladder is contracted. No other abnormalities identified. Common bile duct: Diameter: 4.3 mm Liver: Nodular contour. Mildly heterogeneous echogenicity. No focal mass. Portal vein is patent on color Doppler imaging with normal direction of blood flow towards the liver. Other: Moderate ascites.  Right pleural effusion. IMPRESSION: 1. The liver demonstrates a nodular contour and heterogeneous echogenicity suggesting cirrhosis. There is normal directional blood flow in the portal vein. No liver mass identified. 2. The gallbladder is poorly evaluated due to lack of distention. This may partially explain the gallbladder wall thickening. Gallbladder wall thickening may also be seen in the setting of hepatitis or cirrhosis. No stones, sludge, or Murphy's sign identified on limited views of the gallbladder. 3. The common bile duct is normal. 4. Moderate ascites.  Right pleural effusion. Electronically Signed   By: Dorise Bullion III M.D.   On: 07/16/2022 16:11   US RENAL  Result Date: 07/16/2022 CLINICAL DATA:  Acute kidney injury. EXAM: RENAL / URINARY TRACT ULTRASOUND COMPLETE COMPARISON:  None Available. FINDINGS: Right Kidney: Renal measurements: 9.0 x 6.4 x 4.6 cm = volume: 117 mL. Increased echogenicity of renal parenchyma is noted suggesting medical renal disease. No mass or hydronephrosis visualized. Left Kidney: Renal measurements: 9.6 x 4.5 x 5.5 cm = volume: 124 mL. Increased echogenicity of renal parenchyma is noted suggesting medical renal disease. No mass or hydronephrosis visualized. Bladder: Patient declined visualization of the bladder. Other: Moderate ascites is noted. IMPRESSION: Increased echogenicity of renal parenchyma is noted bilaterally suggesting medical renal disease. No  hydronephrosis or renal obstruction is noted. Moderate ascites. Electronically Signed   By: Marijo Conception M.D.   On: 07/16/2022 15:57   DG Chest Portable 1 View  Result Date: 07/16/2022 CLINICAL DATA:  Shortness of breath. EXAM: PORTABLE CHEST 1 VIEW COMPARISON:  04/04/2022 FINDINGS: 1313 hours. The cardio pericardial silhouette is enlarged. Vascular congestion with diffuse interstitial opacity suggests edema. There is some atelectasis at the lung bases with probable small left effusion. Bones are demineralized. Telemetry leads overlie the chest. IMPRESSION: Enlargement of the cardiopericardial silhouette with vascular congestion and diffuse interstitial opacity suggesting edema. Electronically Signed   By: Misty Stanley M.D.   On: 07/16/2022 13:25    Microbiology: No results found for this or any previous visit (from the past 240 hour(s)).   Labs: Basic Metabolic Panel: Recent Labs  Lab 07/20/22 0051 07/21/22 0133 07/22/22 0033 07/23/22 0025 07/24/22 0033 07/25/22 0058  NA 140 145 142 139 138 140  K 3.4* 3.5 3.4* 2.9* 3.7 3.7  CL 100 100 93* 89* 88* 93*  CO2 30 35* 37* 39* 39* 39*  GLUCOSE 112* 85 126* 102* 84 83  BUN 51* 47* 45* 41* 34* 29*  CREATININE 1.49* 1.23* 1.37* 1.22* 1.18* 1.06*  CALCIUM 9.0 9.1 9.1 8.6* 8.8* 8.2*  MG 1.9  --   --   --   --   --  Liver Function Tests: Recent Labs  Lab 07/22/22 0033  AST 34  ALT 29  ALKPHOS 120  BILITOT 1.5*  PROT 5.8*  ALBUMIN 3.5   No results for input(s): "LIPASE", "AMYLASE" in the last 168 hours. No results for input(s): "AMMONIA" in the last 168 hours. CBC: No results for input(s): "WBC", "NEUTROABS", "HGB", "HCT", "MCV", "PLT" in the last 168 hours. Cardiac Enzymes: No results for input(s): "CKTOTAL", "CKMB", "CKMBINDEX", "TROPONINI" in the last 168 hours. BNP: BNP (last 3 results) Recent Labs    03/12/22 1754 03/25/22 1800 07/16/22 1252  BNP 3,743.2* 1,852.5* >4,500.0*    ProBNP (last 3 results) No results  for input(s): "PROBNP" in the last 8760 hours.  CBG: No results for input(s): "GLUCAP" in the last 168 hours.     Signed:  Domenic Polite MD.  [facility] 07/26/2022, 2:08 PM

## 2022-07-26 NOTE — Progress Notes (Signed)
PROGRESS NOTE    Valerie Rodgers  KKX:[id number] DOB: 08-30-1950 DOA: 07/16/2022 PCP: Pcp, No  71/F  w/ COPD, chronic systolic CHF, cognitive deficits/dementia, CKD 3a, cirrhosis, bipolar disorder, polysubstance/cocaine abuse and paroxysmal atrial fibrillation long-term care resident at [facility] SN[facility] presented with dyspnea, and abdominal edema x 3 to 4 days of worsening dyspnea, that progressed to abdominal and lower extremity edema. In ED in Resp distress, placed on BIPAP, creat 1.8, AST 93 ALT 54, BNP >4,500, troponin 152 and 148 , Toxicology screen negative (was positive for cocaine on 03/2022).  CXR with cardiomegaly, bilateral hilar vascular congestion and bilateral diffuse interstitial infiltrates.   1/30 volume status improving with diuresis, noted to have cognitive deficits 2/2, underwent paracentesis, 3.1 L of clear yellow fluid drained 2/3-2/7: Continue intermittent diuresis sometimes limited by hypotension 2/6: Palliative care consulted  Subjective: Feels okay overall, no events overnight, blood pressure more stable today   Assessment and Plan:  Acute on chronic systolic CHF 16/9678 echo with EF 20 to 25%, global hypokinesis. Interventricular septum is flattened in systole and diastole, RV preserved. RVSP 43.9. moderate dilatation of LA and RA. Moderate TR. albumin is 3.3 -Diuresed with IV Lasix, also underwent paracentesis 2/2, at least 13.2 L negative, urine output has been inaccurate, weight down 35lbs, trended back up 3 to 4 pounds overnight -GDMT limited by CKD and hypotension,  -Continue oral torsemide dose increased,, continue midodrine, blood pressure fluctuates between 80s to 100s without symptoms -Spoke to her friend [name] last few days, unable to reach other family, patient is estranged from family for over 10 years, her friend [name] has been signing her papers for SNF, per palliative team he is her HCPOA -I discussed poor prognosis with [name], and recommended  palliative consult, plan for meeting today, we are recommending Hospice at discharge back to SNF  Liver cirrhosis, ascites -Diuretics as noted above, underwent paracentesis 2/2, albumin given  Acute on chronic hypoxemic respiratory failure due to acute cardiogenic pulmonary edema. -wean O2  Troponin elevation due to heart failure exacerbation, no clinical signs of acute coronary syndrome.    Dementia, cognitive deficits and debility Patient with advanced heart failure and very poor functional status Poor prognosis.   Atrial flutter, paroxysmal (HCC) -Remains in sinus rhythm.  Per chart review patient has declined anticoagulation in the past and currently  AKI-on CKD 3a -Stable, torsemide resumed  Hypokalemia -replace  COPD with acute exacerbation (HCC) No clinical signs of exacerbation, continue oxymetry monitoring.  Discontinued corticosteroids.   Bipolar disorder ([place]) With considerable cognitive deficits,?  Early dementia -B12 normal, TSH mildly elevated  Malnutrition of moderate degree Continue with nutritional supplements.    DVT prophylaxis: Heparin Code Status: DNR Family Communication: None present, called and updated her friend [name] yesterday, left message 2/7 Disposition Plan: Back to [facility] for long-term care after palliative meeting  Consultants: Palliative care   Procedures:   Antimicrobials:    Objective: Vitals:   07/25/22 2043 07/26/22 0506 07/26/22 0722 07/26/22 0837  BP:  116/72 125/70   Pulse: 80 63 73   Resp: 17 18 20    Temp:  97.6 F (36.4 C) 98.5 F (36.9 C)   TempSrc:  Oral Oral   SpO2: 100% 95% 95% 100%  Weight:  63.6 kg    Height:        Intake/Output Summary (Last 24 hours) at 07/26/2022 1106 Last data filed at 07/26/2022 0823 Gross per 24 hour  Intake 1077 ml  Output 100 ml  Net 977  ml   Filed Weights   07/24/22 0110 07/25/22 0419 07/26/22 0506  Weight: 63 kg 62.1 kg 63.6 kg    Examination:  Chronically ill  female laying in bed, awake alert oriented to self and place only, significant cognitive deficits CVS: S1-S2, regular rhythm Lungs: Decreased breath sounds at the bases Abdomen: Soft, mildly distended, nontender, bowel sounds present Extremities: Trace edema in left upper thigh  Data Reviewed:   CBC: No results for input(s): "WBC", "NEUTROABS", "HGB", "HCT", "MCV", "PLT" in the last 168 hours.  Basic Metabolic Panel: Recent Labs  Lab 07/20/22 0051 07/21/22 0133 07/22/22 0033 07/23/22 0025 07/24/22 0033 07/25/22 0058  NA 140 145 142 139 138 140  K 3.4* 3.5 3.4* 2.9* 3.7 3.7  CL 100 100 93* 89* 88* 93*  CO2 30 35* 37* 39* 39* 39*  GLUCOSE 112* 85 126* 102* 84 83  BUN 51* 47* 45* 41* 34* 29*  CREATININE 1.49* 1.23* 1.37* 1.22* 1.18* 1.06*  CALCIUM 9.0 9.1 9.1 8.6* 8.8* 8.2*  MG 1.9  --   --   --   --   --    GFR: Estimated Creatinine Clearance: 41.4 mL/min (A) (by C-G formula based on SCr of 1.06 mg/dL (H)). Liver Function Tests: Recent Labs  Lab 07/22/22 0033  AST 34  ALT 29  ALKPHOS 120  BILITOT 1.5*  PROT 5.8*  ALBUMIN 3.5   No results for input(s): "LIPASE", "AMYLASE" in the last 168 hours.  No results for input(s): "AMMONIA" in the last 168 hours.  Coagulation Profile: No results for input(s): "INR", "PROTIME" in the last 168 hours.  Cardiac Enzymes: No results for input(s): "CKTOTAL", "CKMB", "CKMBINDEX", "TROPONINI" in the last 168 hours. BNP (last 3 results) No results for input(s): "PROBNP" in the last 8760 hours. HbA1C: No results for input(s): "HGBA1C" in the last 72 hours. CBG: No results for input(s): "GLUCAP" in the last 168 hours. Lipid Profile: No results for input(s): "CHOL", "HDL", "LDLCALC", "TRIG", "CHOLHDL", "LDLDIRECT" in the last 72 hours. Thyroid Function Tests: No results for input(s): "TSH", "T4TOTAL", "FREET4", "T3FREE", "THYROIDAB" in the last 72 hours.  Anemia Panel: No results for input(s): "VITAMINB12", "FOLATE", "FERRITIN",  "TIBC", "IRON", "RETICCTPCT" in the last 72 hours. Urine analysis:    Component Value Date/Time   COLORURINE YELLOW 07/16/2022 1253   APPEARANCEUR HAZY (A) 07/16/2022 1253   LABSPEC 1.006 07/16/2022 1253   PHURINE 5.0 07/16/2022 1253   GLUCOSEU NEGATIVE 07/16/2022 1253   HGBUR MODERATE (A) 07/16/2022 1253   BILIRUBINUR NEGATIVE 07/16/2022 1253   KETONESUR NEGATIVE 07/16/2022 1253   PROTEINUR NEGATIVE 07/16/2022 1253   NITRITE NEGATIVE 07/16/2022 1253   LEUKOCYTESUR LARGE (A) 07/16/2022 1253   Sepsis Labs: @LABRCNTIP (procalcitonin:4,lacticidven:4)  ) Recent Results (from the past 240 hour(s))  Resp panel by RT-PCR (RSV, Flu A&B, Covid) Anterior Nasal Swab     Status: None   Collection Time: 07/16/22  1:08 PM   Specimen: Anterior Nasal Swab  Result Value Ref Range Status   SARS Coronavirus 2 by RT PCR NEGATIVE NEGATIVE Final    Comment: (NOTE) SARS-CoV-2 target nucleic acids are NOT DETECTED.  The SARS-CoV-2 RNA is generally detectable in upper respiratory specimens during the acute phase of infection. The lowest concentration of SARS-CoV-2 viral copies this assay can detect is 138 copies/mL. A negative result does not preclude SARS-Cov-2 infection and should not be used as the sole basis for treatment or other patient management decisions. A negative result may occur with  improper specimen collection/handling, submission  of specimen other than nasopharyngeal swab, presence of viral mutation(s) within the areas targeted by this assay, and inadequate number of viral copies(<138 copies/mL). A negative result must be combined with clinical observations, patient history, and epidemiological information. The expected result is Negative.  Fact Sheet for Patients:  EntrepreneurPulse.com.au  Fact Sheet for Healthcare Providers:  IncredibleEmployment.be  This test is no t yet approved or cleared by the [place] FDA and  has been  authorized for detection and/or diagnosis of SARS-CoV-2 by FDA under an Emergency Use Authorization (EUA). This EUA will remain  in effect (meaning this test can be used) for the duration of the COVID-19 declaration under Section 564(b)(1) of the Act, 21 U.S.C.section 360bbb-3(b)(1), unless the authorization is terminated  or revoked sooner.       Influenza A by PCR NEGATIVE NEGATIVE Final   Influenza B by PCR NEGATIVE NEGATIVE Final    Comment: (NOTE) The Xpert Xpress SARS-CoV-2/FLU/RSV plus assay is intended as an aid in the diagnosis of influenza from Nasopharyngeal swab specimens and should not be used as a sole basis for treatment. Nasal washings and aspirates are unacceptable for Xpert Xpress SARS-CoV-2/FLU/RSV testing.  Fact Sheet for Patients: EntrepreneurPulse.com.au  Fact Sheet for Healthcare Providers: IncredibleEmployment.be  This test is not yet approved or cleared by the [place] FDA and has been authorized for detection and/or diagnosis of SARS-CoV-2 by FDA under an Emergency Use Authorization (EUA). This EUA will remain in effect (meaning this test can be used) for the duration of the COVID-19 declaration under Section 564(b)(1) of the Act, 21 U.S.C. section 360bbb-3(b)(1), unless the authorization is terminated or revoked.     Resp Syncytial Virus by PCR NEGATIVE NEGATIVE Final    Comment: (NOTE) Fact Sheet for Patients: EntrepreneurPulse.com.au  Fact Sheet for Healthcare Providers: IncredibleEmployment.be  This test is not yet approved or cleared by the [place] FDA and has been authorized for detection and/or diagnosis of SARS-CoV-2 by FDA under an Emergency Use Authorization (EUA). This EUA will remain in effect (meaning this test can be used) for the duration of the COVID-19 declaration under Section 564(b)(1) of the Act, 21 U.S.C. section 360bbb-3(b)(1), unless the  authorization is terminated or revoked.  Performed at [facility], [place] [address], [place], [place] [address]      Radiology Studies: No results found.   Scheduled Meds:  feeding supplement  237 mL Oral BID BM   heparin  5,000 Units Subcutaneous Q12H   midodrine  10 mg Oral TID   mometasone-formoterol  2 puff Inhalation BID   multivitamin with minerals  1 tablet Oral Daily   sodium chloride flush  3 mL Intravenous Q12H   torsemide  40 mg Oral Daily   traZODone  50 mg Oral QPM   Continuous Infusions:  sodium chloride       LOS: 10 days    Time spent: 35min    Domenic Polite, MD [facility]   07/26/2022, 11:06 AM

## 2022-08-02 ENCOUNTER — Ambulatory Visit: Payer: 59

## 2022-08-02 ENCOUNTER — Ambulatory Visit: Payer: Medicare Other | Admitting: Internal Medicine

## 2022-08-11 IMAGING — CT CT HEAD W/O CM
4 series · 14 of 47 positions shown, 16 images · non-contrast
Comparison: None Available.

CLINICAL DATA: Mental status change.  Unknown cause.



[Series 2: head wo · axial · 0.30mm/px · z∈[+1461,+1571]mm · 6 of 32 slices shown, 8 images]
[im 5/32  brain]
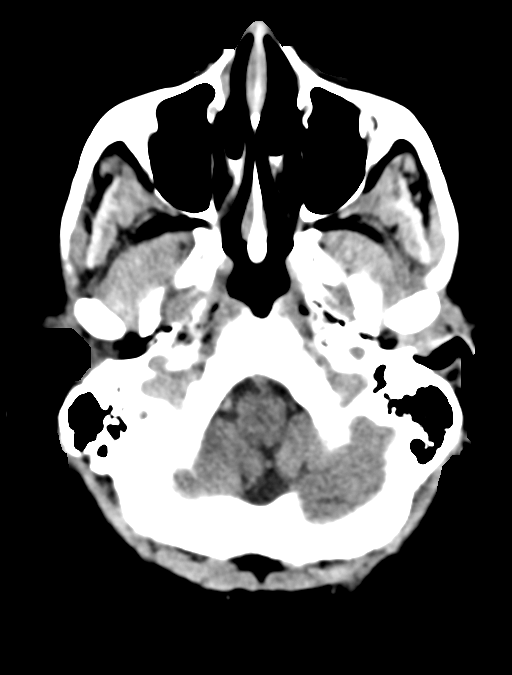
[im 5/32  bone]
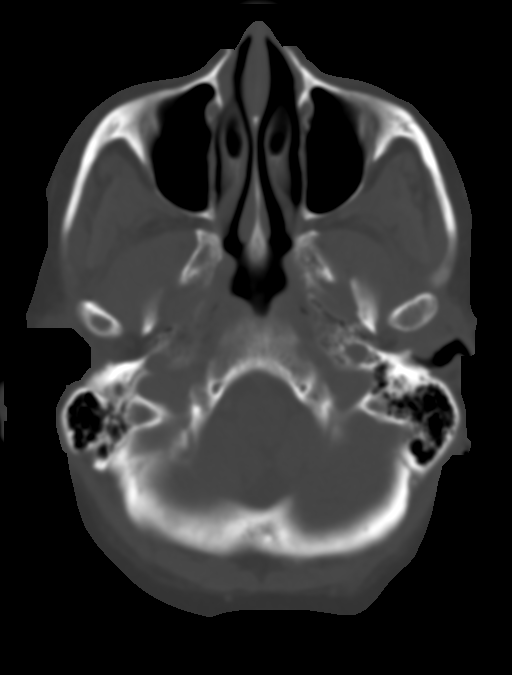
[im 9/32  brain]
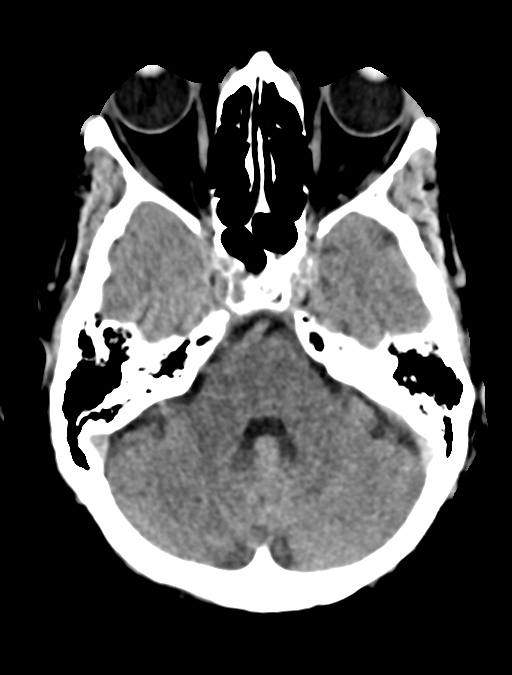
[im 14/32  brain]
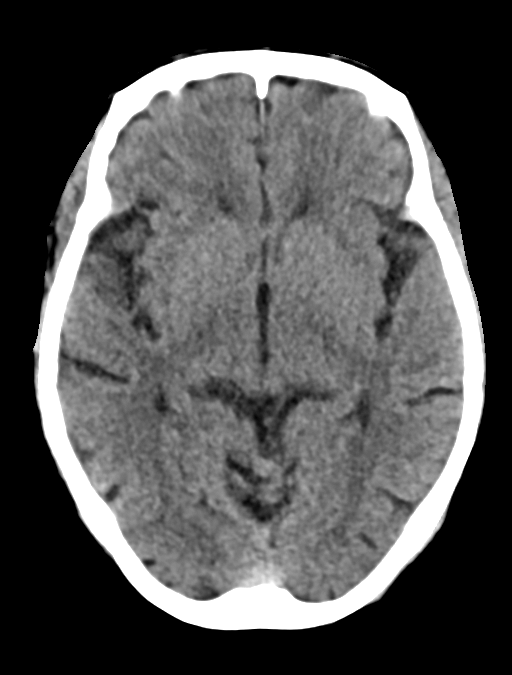
[im 18/32  brain]
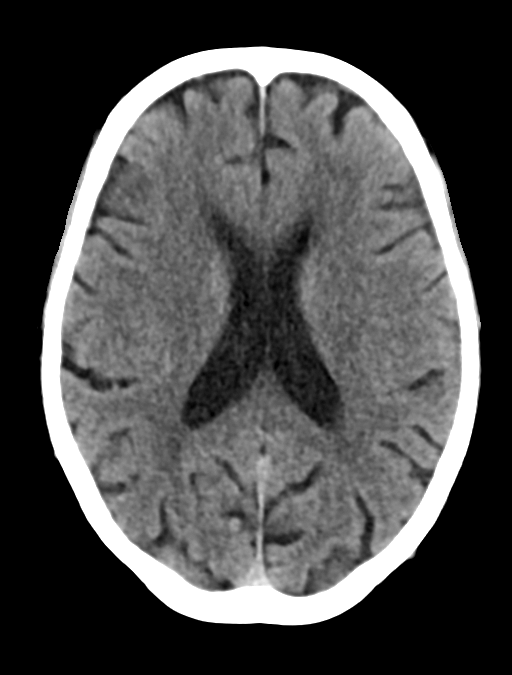
[im 23/32  brain]
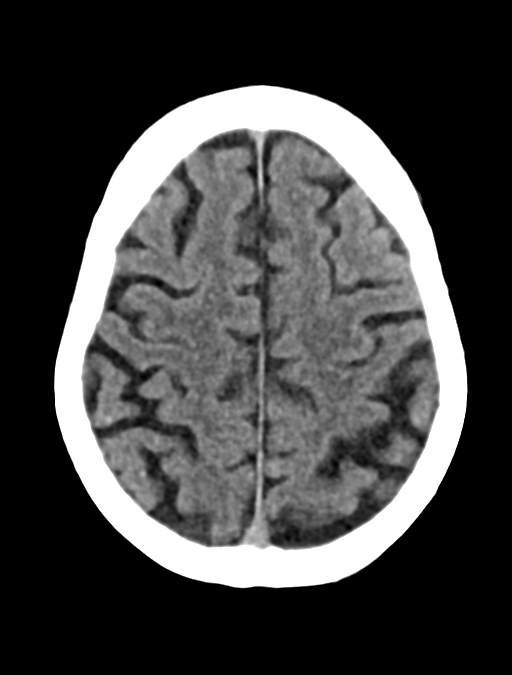
[im 23/32  bone]
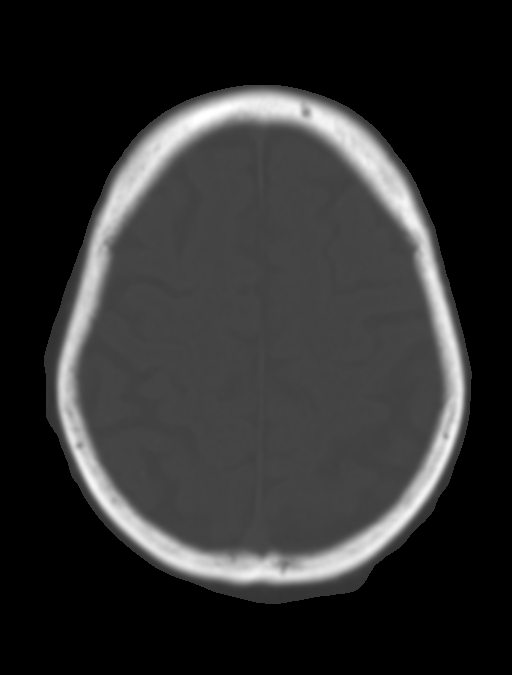
[im 27/32  brain]
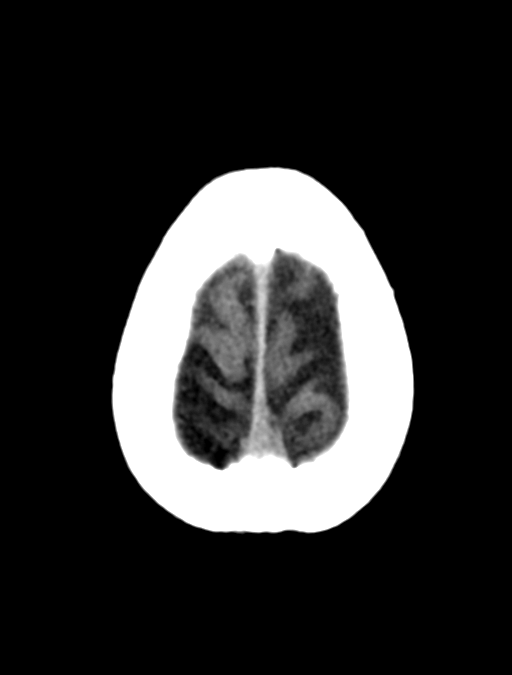

[Series 3: head bone · axial · 0.41mm/px · z∈[+1442,+1458]mm · 2 of 82 slices shown]
[im 8/82  bone]
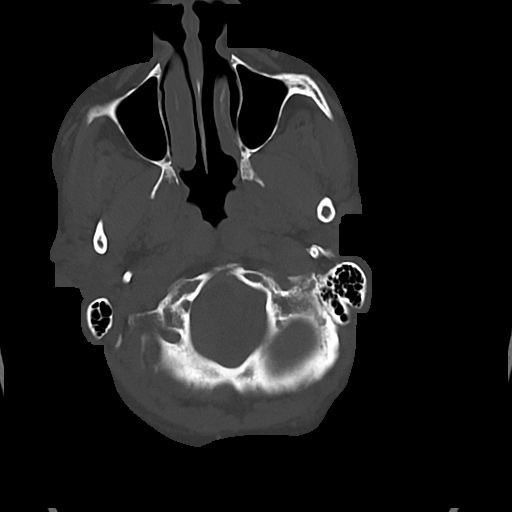
[im 16/82  bone]
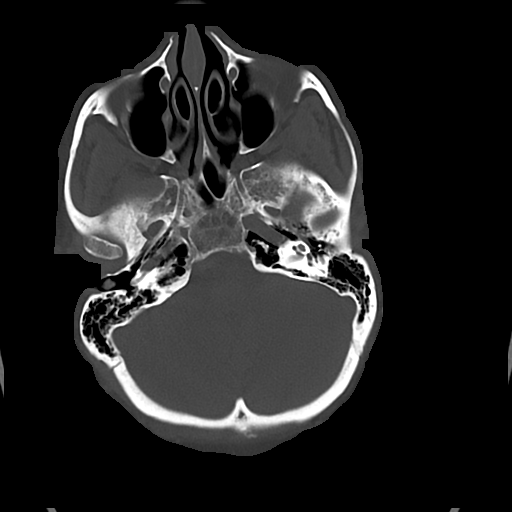

[Series 4: cor soft · coronal · 0.30mm/px · 3 of 68 slices shown]
[im 23/68  brain]
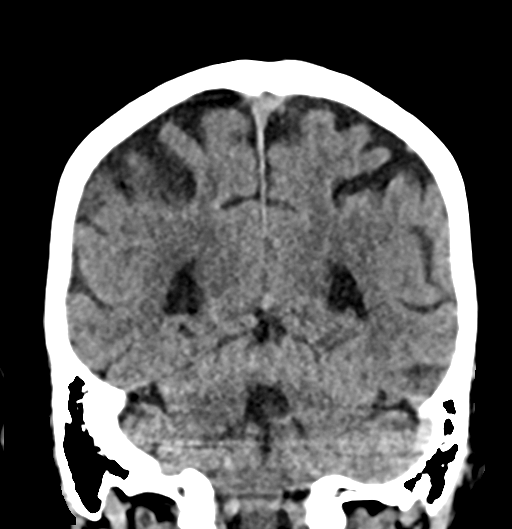
[im 30/68  brain]
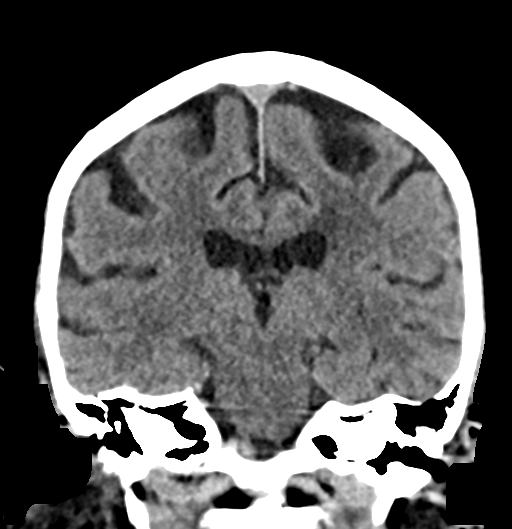
[im 38/68  brain]
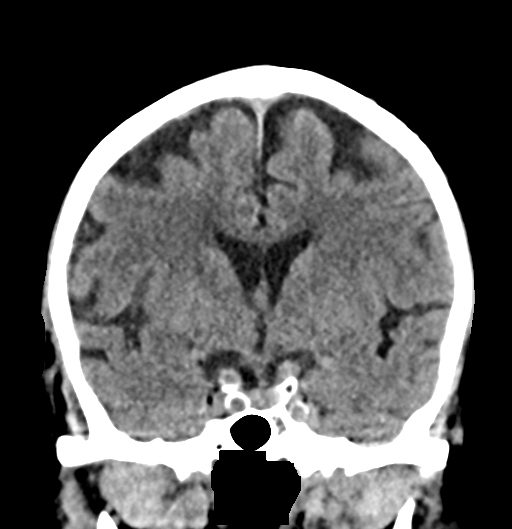

[Series 5: sag soft · sagittal · 0.31mm/px · 3 of 51 slices shown]
[im 17/51  brain]
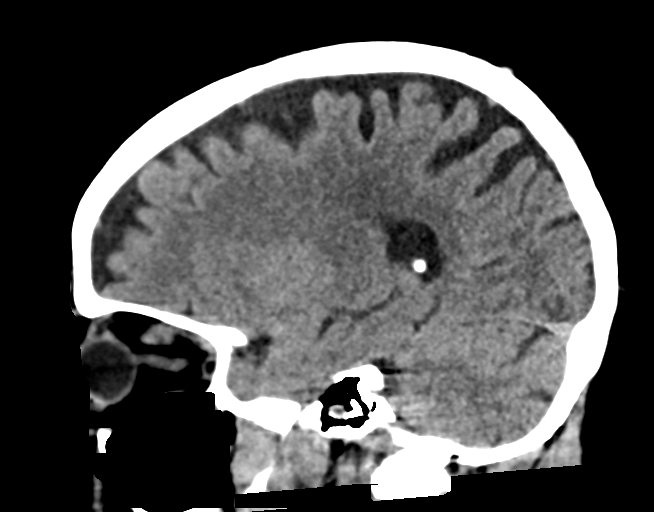
[im 26/51  brain]
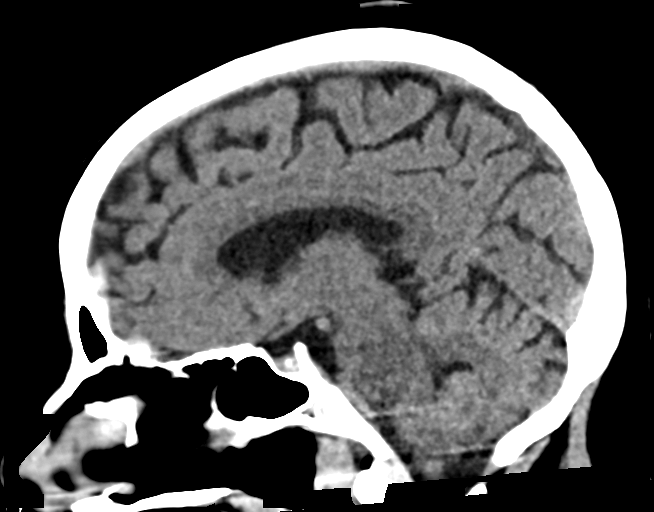
[im 34/51  brain]
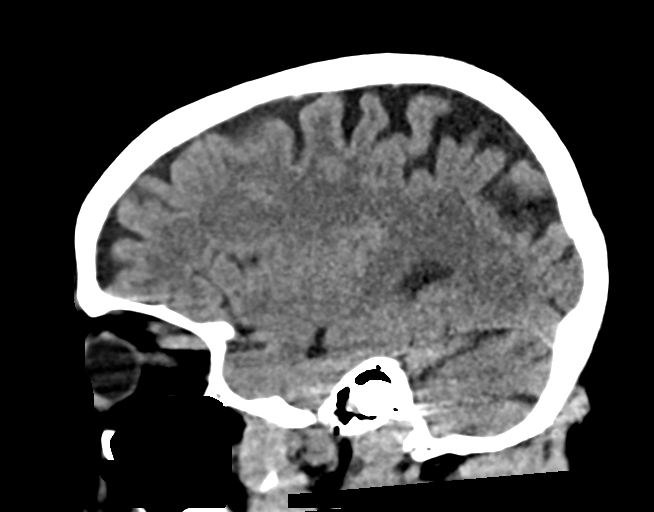

[14 of 47 positions shown; findings below may reference images not displayed]

FINDINGS: Brain: No evidence of acute infarction, hemorrhage, hydrocephalus,
extra-axial collection or mass lesion/mass effect. Mild cerebral
atrophy.

Vascular: No hyperdense vessel or unexpected calcification.

Skull: Normal. Negative for fracture or focal lesion.

Sinuses/Orbits: No acute finding.

Other: None.
IMPRESSION: No acute intracranial abnormality.
